# Patient Record
Sex: Male | Born: 1945 | Race: Black or African American | Hispanic: No | Marital: Married | Smoking: Former smoker
Health system: Southern US, Community
[De-identification: ages and names within clinical notes are randomized; demographics above are authoritative.]

## PROBLEM LIST (undated history)

## (undated) DIAGNOSIS — E785 Hyperlipidemia, unspecified: Secondary | ICD-10-CM

## (undated) DIAGNOSIS — R0600 Dyspnea, unspecified: Secondary | ICD-10-CM

## (undated) DIAGNOSIS — E119 Type 2 diabetes mellitus without complications: Secondary | ICD-10-CM

## (undated) DIAGNOSIS — F329 Major depressive disorder, single episode, unspecified: Secondary | ICD-10-CM

## (undated) DIAGNOSIS — F32A Depression, unspecified: Secondary | ICD-10-CM

## (undated) DIAGNOSIS — N5089 Other specified disorders of the male genital organs: Secondary | ICD-10-CM

## (undated) DIAGNOSIS — I509 Heart failure, unspecified: Secondary | ICD-10-CM

## (undated) DIAGNOSIS — Z862 Personal history of diseases of the blood and blood-forming organs and certain disorders involving the immune mechanism: Secondary | ICD-10-CM

## (undated) DIAGNOSIS — R5383 Other fatigue: Secondary | ICD-10-CM

## (undated) DIAGNOSIS — D649 Anemia, unspecified: Secondary | ICD-10-CM

## (undated) DIAGNOSIS — M1990 Unspecified osteoarthritis, unspecified site: Secondary | ICD-10-CM

## (undated) DIAGNOSIS — H269 Unspecified cataract: Secondary | ICD-10-CM

## (undated) DIAGNOSIS — R0609 Other forms of dyspnea: Secondary | ICD-10-CM

## (undated) DIAGNOSIS — K635 Polyp of colon: Secondary | ICD-10-CM

## (undated) DIAGNOSIS — I447 Left bundle-branch block, unspecified: Secondary | ICD-10-CM

## (undated) DIAGNOSIS — I10 Essential (primary) hypertension: Secondary | ICD-10-CM

## (undated) DIAGNOSIS — N189 Chronic kidney disease, unspecified: Secondary | ICD-10-CM

## (undated) DIAGNOSIS — F0280 Dementia in other diseases classified elsewhere without behavioral disturbance: Secondary | ICD-10-CM

## (undated) DIAGNOSIS — E559 Vitamin D deficiency, unspecified: Secondary | ICD-10-CM

## (undated) DIAGNOSIS — IMO0002 Reserved for concepts with insufficient information to code with codable children: Secondary | ICD-10-CM

## (undated) DIAGNOSIS — R269 Unspecified abnormalities of gait and mobility: Secondary | ICD-10-CM

## (undated) DIAGNOSIS — K219 Gastro-esophageal reflux disease without esophagitis: Secondary | ICD-10-CM

## (undated) DIAGNOSIS — F419 Anxiety disorder, unspecified: Secondary | ICD-10-CM

## (undated) DIAGNOSIS — Z5189 Encounter for other specified aftercare: Secondary | ICD-10-CM

## (undated) DIAGNOSIS — R600 Localized edema: Secondary | ICD-10-CM

## (undated) HISTORY — DX: Localized edema: R60.0

## (undated) HISTORY — DX: Gastro-esophageal reflux disease without esophagitis: K21.9

## (undated) HISTORY — DX: Personal history of diseases of the blood and blood-forming organs and certain disorders involving the immune mechanism: Z86.2

## (undated) HISTORY — DX: Polyp of colon: K63.5

## (undated) HISTORY — DX: Unspecified abnormalities of gait and mobility: R26.9

## (undated) HISTORY — PX: UPPER GASTROINTESTINAL ENDOSCOPY: SHX188

## (undated) HISTORY — DX: Essential (primary) hypertension: I10

## (undated) HISTORY — DX: Anemia, unspecified: D64.9

## (undated) HISTORY — DX: Vitamin D deficiency, unspecified: E55.9

## (undated) HISTORY — DX: Other fatigue: R53.83

## (undated) HISTORY — DX: Depression, unspecified: F32.A

## (undated) HISTORY — DX: Reserved for concepts with insufficient information to code with codable children: IMO0002

## (undated) HISTORY — PX: ROTATOR CUFF REPAIR: SHX139

## (undated) HISTORY — DX: Encounter for other specified aftercare: Z51.89

## (undated) HISTORY — DX: Unspecified osteoarthritis, unspecified site: M19.90

## (undated) HISTORY — DX: Anxiety disorder, unspecified: F41.9

## (undated) HISTORY — DX: Chronic kidney disease, unspecified: N18.9

## (undated) HISTORY — DX: Unspecified cataract: H26.9

## (undated) HISTORY — DX: Hyperlipidemia, unspecified: E78.5

## (undated) HISTORY — DX: Heart failure, unspecified: I50.9

## (undated) HISTORY — DX: Dyspnea, unspecified: R06.00

## (undated) HISTORY — DX: Left bundle-branch block, unspecified: I44.7

## (undated) HISTORY — DX: Major depressive disorder, single episode, unspecified: F32.9

## (undated) HISTORY — DX: Dementia in other diseases classified elsewhere, unspecified severity, without behavioral disturbance, psychotic disturbance, mood disturbance, and anxiety: F02.80

## (undated) HISTORY — DX: Other specified disorders of the male genital organs: N50.89

## (undated) HISTORY — DX: Other forms of dyspnea: R06.09

---

## 1998-04-17 ENCOUNTER — Ambulatory Visit (HOSPITAL_COMMUNITY): Admission: RE | Admit: 1998-04-17 | Discharge: 1998-04-17 | Payer: Self-pay | Admitting: Internal Medicine

## 1998-04-17 ENCOUNTER — Encounter: Payer: Self-pay | Admitting: Internal Medicine

## 1998-05-24 ENCOUNTER — Ambulatory Visit (HOSPITAL_COMMUNITY): Admission: RE | Admit: 1998-05-24 | Discharge: 1998-05-24 | Payer: Self-pay | Admitting: Gastroenterology

## 1998-05-24 ENCOUNTER — Encounter: Payer: Self-pay | Admitting: Gastroenterology

## 2002-01-06 HISTORY — PX: HIP SURGERY: SHX245

## 2002-06-29 ENCOUNTER — Encounter: Payer: Self-pay | Admitting: Internal Medicine

## 2002-06-29 ENCOUNTER — Encounter: Admission: RE | Admit: 2002-06-29 | Discharge: 2002-06-29 | Payer: Self-pay | Admitting: Internal Medicine

## 2002-09-16 ENCOUNTER — Ambulatory Visit (HOSPITAL_COMMUNITY): Admission: RE | Admit: 2002-09-16 | Discharge: 2002-09-16 | Payer: Self-pay | Admitting: Orthopaedic Surgery

## 2002-11-07 ENCOUNTER — Inpatient Hospital Stay (HOSPITAL_COMMUNITY): Admission: RE | Admit: 2002-11-07 | Discharge: 2002-11-11 | Payer: Self-pay | Admitting: Orthopaedic Surgery

## 2004-09-23 ENCOUNTER — Ambulatory Visit: Payer: Self-pay | Admitting: Gastroenterology

## 2004-10-09 ENCOUNTER — Ambulatory Visit: Payer: Self-pay | Admitting: Gastroenterology

## 2004-10-09 ENCOUNTER — Encounter (INDEPENDENT_AMBULATORY_CARE_PROVIDER_SITE_OTHER): Payer: Self-pay | Admitting: *Deleted

## 2005-04-05 ENCOUNTER — Encounter: Admission: RE | Admit: 2005-04-05 | Discharge: 2005-04-05 | Payer: Self-pay | Admitting: Orthopedic Surgery

## 2005-05-22 ENCOUNTER — Ambulatory Visit (HOSPITAL_COMMUNITY): Admission: RE | Admit: 2005-05-22 | Discharge: 2005-05-23 | Payer: Self-pay | Admitting: Orthopedic Surgery

## 2008-08-10 ENCOUNTER — Encounter: Payer: Self-pay | Admitting: Gastroenterology

## 2008-09-21 ENCOUNTER — Ambulatory Visit: Payer: Self-pay | Admitting: Gastroenterology

## 2008-09-21 DIAGNOSIS — Z860100 Personal history of colon polyps, unspecified: Secondary | ICD-10-CM | POA: Insufficient documentation

## 2008-09-21 DIAGNOSIS — E119 Type 2 diabetes mellitus without complications: Secondary | ICD-10-CM | POA: Insufficient documentation

## 2008-09-21 DIAGNOSIS — E1129 Type 2 diabetes mellitus with other diabetic kidney complication: Secondary | ICD-10-CM

## 2008-09-21 DIAGNOSIS — E1122 Type 2 diabetes mellitus with diabetic chronic kidney disease: Secondary | ICD-10-CM | POA: Insufficient documentation

## 2008-09-21 DIAGNOSIS — D509 Iron deficiency anemia, unspecified: Secondary | ICD-10-CM

## 2008-09-25 ENCOUNTER — Telehealth: Payer: Self-pay | Admitting: Gastroenterology

## 2008-10-06 ENCOUNTER — Ambulatory Visit: Payer: Self-pay | Admitting: Gastroenterology

## 2008-10-06 LAB — CONVERTED CEMR LAB: Fecal Occult Bld: NEGATIVE

## 2008-10-23 ENCOUNTER — Telehealth: Payer: Self-pay | Admitting: Gastroenterology

## 2008-10-24 ENCOUNTER — Ambulatory Visit: Payer: Self-pay | Admitting: Gastroenterology

## 2008-11-23 ENCOUNTER — Ambulatory Visit: Payer: Self-pay | Admitting: Gastroenterology

## 2008-11-23 LAB — CONVERTED CEMR LAB
Fecal Occult Blood: NEGATIVE
OCCULT 1: NEGATIVE
OCCULT 2: NEGATIVE
OCCULT 3: NEGATIVE
OCCULT 4: NEGATIVE
OCCULT 5: NEGATIVE

## 2008-11-24 ENCOUNTER — Encounter: Payer: Self-pay | Admitting: Gastroenterology

## 2009-06-21 ENCOUNTER — Encounter
Admission: RE | Admit: 2009-06-21 | Discharge: 2009-06-21 | Payer: Self-pay | Source: Home / Self Care | Admitting: Neurology

## 2009-08-30 ENCOUNTER — Ambulatory Visit: Payer: Self-pay | Admitting: Psychology

## 2009-11-28 ENCOUNTER — Encounter: Payer: Self-pay | Admitting: Physician Assistant

## 2009-12-04 ENCOUNTER — Encounter: Payer: Self-pay | Admitting: Gastroenterology

## 2010-01-10 ENCOUNTER — Telehealth: Payer: Self-pay | Admitting: Gastroenterology

## 2010-01-11 ENCOUNTER — Ambulatory Visit
Admission: RE | Admit: 2010-01-11 | Discharge: 2010-01-11 | Payer: Self-pay | Source: Home / Self Care | Attending: Internal Medicine | Admitting: Internal Medicine

## 2010-01-11 ENCOUNTER — Encounter: Payer: Self-pay | Admitting: Gastroenterology

## 2010-01-11 ENCOUNTER — Other Ambulatory Visit: Payer: Self-pay | Admitting: Physician Assistant

## 2010-01-11 DIAGNOSIS — E785 Hyperlipidemia, unspecified: Secondary | ICD-10-CM | POA: Insufficient documentation

## 2010-01-11 DIAGNOSIS — F329 Major depressive disorder, single episode, unspecified: Secondary | ICD-10-CM | POA: Insufficient documentation

## 2010-01-11 DIAGNOSIS — I10 Essential (primary) hypertension: Secondary | ICD-10-CM | POA: Insufficient documentation

## 2010-01-11 DIAGNOSIS — K219 Gastro-esophageal reflux disease without esophagitis: Secondary | ICD-10-CM | POA: Insufficient documentation

## 2010-01-11 LAB — IRON: Iron: 37 ug/dL — ABNORMAL LOW (ref 42–165)

## 2010-01-11 LAB — FERRITIN: Ferritin: 60.4 ng/mL (ref 22.0–322.0)

## 2010-01-11 LAB — IBC PANEL
Iron: 37 ug/dL — ABNORMAL LOW (ref 42–165)
Saturation Ratios: 14.5 % — ABNORMAL LOW (ref 20.0–50.0)
Transferrin: 181.9 mg/dL — ABNORMAL LOW (ref 212.0–360.0)

## 2010-01-17 ENCOUNTER — Telehealth: Payer: Self-pay | Admitting: Physician Assistant

## 2010-01-22 ENCOUNTER — Telehealth: Payer: Self-pay | Admitting: Physician Assistant

## 2010-01-28 ENCOUNTER — Ambulatory Visit
Admission: RE | Admit: 2010-01-28 | Discharge: 2010-01-28 | Payer: Self-pay | Source: Home / Self Care | Attending: Gastroenterology | Admitting: Gastroenterology

## 2010-01-28 ENCOUNTER — Encounter: Payer: Self-pay | Admitting: Gastroenterology

## 2010-01-28 HISTORY — PX: COLONOSCOPY: SHX174

## 2010-01-29 LAB — GLUCOSE, CAPILLARY
Glucose-Capillary: 178 mg/dL — ABNORMAL HIGH (ref 70–99)
Glucose-Capillary: 166 mg/dL — ABNORMAL HIGH (ref 70–99)

## 2010-02-03 ENCOUNTER — Emergency Department (HOSPITAL_COMMUNITY)
Admission: EM | Admit: 2010-02-03 | Discharge: 2010-02-03 | Payer: Self-pay | Source: Home / Self Care | Admitting: Emergency Medicine

## 2010-02-03 LAB — BASIC METABOLIC PANEL
BUN: 18 mg/dL (ref 6–23)
CO2: 22 mEq/L (ref 19–32)
Calcium: 9 mg/dL (ref 8.4–10.5)
Chloride: 99 mEq/L (ref 96–112)
Creatinine, Ser: 1.14 mg/dL (ref 0.4–1.5)
GFR calc Af Amer: >60 mL/min (ref >60)
GFR calc non Af Amer: >60 mL/min (ref >60)
Glucose, Bld: 231 mg/dL — ABNORMAL HIGH (ref 70–99)
Potassium: 4.3 mEq/L (ref 3.5–5.1)
Sodium: 132 mEq/L — ABNORMAL LOW (ref 135–145)

## 2010-02-03 LAB — CBC
HCT: 26.6 % — ABNORMAL LOW (ref 39.0–52.0)
Hemoglobin: 8.8 g/dL — ABNORMAL LOW (ref 13.0–17.0)
MCH: 28 pg (ref 26.0–34.0)
MCHC: 33.1 g/dL (ref 30.0–36.0)
MCV: 84.7 fL (ref 78.0–100.0)
Platelets: 243 10*3/uL (ref 150–400)
RBC: 3.14 MIL/uL — ABNORMAL LOW (ref 4.22–5.81)
RDW: 15.3 % (ref 11.5–15.5)
WBC: 7.6 10*3/uL (ref 4.0–10.5)

## 2010-02-03 LAB — DIFFERENTIAL
Basophils Absolute: 0 10*3/uL (ref 0.0–0.1)
Basophils Relative: 0 % (ref 0–1)
Eosinophils Absolute: 0 10*3/uL (ref 0.0–0.7)
Eosinophils Relative: 0 % (ref 0–5)
Lymphocytes Relative: 10 % — ABNORMAL LOW (ref 12–46)
Lymphs Abs: 0.8 10*3/uL (ref 0.7–4.0)
Monocytes Absolute: 0.6 10*3/uL (ref 0.1–1.0)
Monocytes Relative: 7 % (ref 3–12)
Neutro Abs: 6.2 10*3/uL (ref 1.7–7.7)
Neutrophils Relative %: 82 % — ABNORMAL HIGH (ref 43–77)

## 2010-02-03 LAB — URINALYSIS, ROUTINE W REFLEX MICROSCOPIC
Hgb urine dipstick: NEGATIVE
Ketones, ur: 15 mg/dL — AB
Nitrite: NEGATIVE
Protein, ur: NEGATIVE mg/dL
Specific Gravity, Urine: 1.023 (ref 1.005–1.030)
Urine Glucose, Fasting: NEGATIVE mg/dL
Urobilinogen, UA: 1 mg/dL (ref 0.0–1.0)
pH: 5.5 (ref 5.0–8.0)

## 2010-02-04 ENCOUNTER — Encounter: Payer: Self-pay | Admitting: Gastroenterology

## 2010-02-04 ENCOUNTER — Inpatient Hospital Stay (HOSPITAL_COMMUNITY)
Admission: EM | Admit: 2010-02-04 | Discharge: 2010-02-13 | DRG: 468 | Disposition: A | Discharge status: Skilled Nursing Facility | Payer: BC Managed Care – PPO | Attending: Internal Medicine | Admitting: Internal Medicine

## 2010-02-04 DIAGNOSIS — R7401 Elevation of levels of liver transaminase levels: Secondary | ICD-10-CM | POA: Diagnosis present

## 2010-02-04 DIAGNOSIS — I10 Essential (primary) hypertension: Secondary | ICD-10-CM | POA: Diagnosis present

## 2010-02-04 DIAGNOSIS — K5900 Constipation, unspecified: Secondary | ICD-10-CM | POA: Diagnosis not present

## 2010-02-04 DIAGNOSIS — M009 Pyogenic arthritis, unspecified: Secondary | ICD-10-CM | POA: Diagnosis present

## 2010-02-04 DIAGNOSIS — B966 Bacteroides fragilis [B. fragilis] as the cause of diseases classified elsewhere: Secondary | ICD-10-CM | POA: Diagnosis present

## 2010-02-04 DIAGNOSIS — R000 Tachycardia, unspecified: Secondary | ICD-10-CM | POA: Diagnosis not present

## 2010-02-04 DIAGNOSIS — R0602 Shortness of breath: Secondary | ICD-10-CM | POA: Diagnosis not present

## 2010-02-04 DIAGNOSIS — K6812 Psoas muscle abscess: Principal | ICD-10-CM | POA: Diagnosis present

## 2010-02-04 DIAGNOSIS — Z96649 Presence of unspecified artificial hip joint: Secondary | ICD-10-CM

## 2010-02-04 DIAGNOSIS — M629 Disorder of muscle, unspecified: Secondary | ICD-10-CM | POA: Diagnosis present

## 2010-02-04 DIAGNOSIS — E119 Type 2 diabetes mellitus without complications: Secondary | ICD-10-CM | POA: Diagnosis present

## 2010-02-04 DIAGNOSIS — D638 Anemia in other chronic diseases classified elsewhere: Secondary | ICD-10-CM | POA: Diagnosis present

## 2010-02-04 DIAGNOSIS — K219 Gastro-esophageal reflux disease without esophagitis: Secondary | ICD-10-CM | POA: Diagnosis present

## 2010-02-04 DIAGNOSIS — M2420 Disorder of ligament, unspecified site: Secondary | ICD-10-CM | POA: Diagnosis present

## 2010-02-04 DIAGNOSIS — T3695XA Adverse effect of unspecified systemic antibiotic, initial encounter: Secondary | ICD-10-CM | POA: Diagnosis not present

## 2010-02-04 DIAGNOSIS — B9689 Other specified bacterial agents as the cause of diseases classified elsewhere: Secondary | ICD-10-CM | POA: Diagnosis present

## 2010-02-04 DIAGNOSIS — R7402 Elevation of levels of lactic acid dehydrogenase (LDH): Secondary | ICD-10-CM | POA: Diagnosis present

## 2010-02-04 DIAGNOSIS — T4275XA Adverse effect of unspecified antiepileptic and sedative-hypnotic drugs, initial encounter: Secondary | ICD-10-CM | POA: Diagnosis not present

## 2010-02-04 DIAGNOSIS — R197 Diarrhea, unspecified: Secondary | ICD-10-CM | POA: Diagnosis not present

## 2010-02-04 DIAGNOSIS — M1990 Unspecified osteoarthritis, unspecified site: Secondary | ICD-10-CM | POA: Diagnosis present

## 2010-02-04 LAB — CBC
HCT: 24.8 % — ABNORMAL LOW (ref 39.0–52.0)
Hemoglobin: 8.3 g/dL — ABNORMAL LOW (ref 13.0–17.0)
MCH: 28.5 pg (ref 26.0–34.0)
MCHC: 33.5 g/dL (ref 30.0–36.0)
MCV: 85.2 fL (ref 78.0–100.0)
Platelets: 259 10*3/uL (ref 150–400)
RBC: 2.91 MIL/uL — ABNORMAL LOW (ref 4.22–5.81)
RDW: 15.1 % (ref 11.5–15.5)
WBC: 6.2 10*3/uL (ref 4.0–10.5)

## 2010-02-04 LAB — COMPREHENSIVE METABOLIC PANEL
ALT: 302 U/L — ABNORMAL HIGH (ref 0–53)
AST: 120 U/L — ABNORMAL HIGH (ref 0–37)
Albumin: 2 g/dL — ABNORMAL LOW (ref 3.5–5.2)
Alkaline Phosphatase: 141 U/L — ABNORMAL HIGH (ref 39–117)
BUN: 31 mg/dL — ABNORMAL HIGH (ref 6–23)
CO2: 26 mEq/L (ref 19–32)
Calcium: 9.5 mg/dL (ref 8.4–10.5)
Chloride: 102 mEq/L (ref 96–112)
Creatinine, Ser: 1.3 mg/dL (ref 0.4–1.5)
GFR calc Af Amer: >60 mL/min (ref >60)
GFR calc non Af Amer: 56 mL/min — ABNORMAL LOW (ref >60)
Glucose, Bld: 202 mg/dL — ABNORMAL HIGH (ref 70–99)
Potassium: 4.4 mEq/L (ref 3.5–5.1)
Sodium: 134 mEq/L — ABNORMAL LOW (ref 135–145)
Total Bilirubin: 0.7 mg/dL (ref 0.3–1.2)
Total Protein: 6.4 g/dL (ref 6.0–8.3)

## 2010-02-04 LAB — PROTIME-INR
INR: 1.28 (ref 0.00–1.49)
Prothrombin Time: 16.2 seconds — ABNORMAL HIGH (ref 11.6–15.2)

## 2010-02-04 LAB — URINALYSIS, ROUTINE W REFLEX MICROSCOPIC
Bilirubin Urine: NEGATIVE
Ketones, ur: NEGATIVE mg/dL
Nitrite: NEGATIVE
Protein, ur: NEGATIVE mg/dL
Specific Gravity, Urine: 1.024 (ref 1.005–1.030)
Urine Glucose, Fasting: NEGATIVE mg/dL
Urobilinogen, UA: 0.2 mg/dL (ref 0.0–1.0)
pH: 5 (ref 5.0–8.0)

## 2010-02-04 LAB — DIFFERENTIAL
Basophils Absolute: 0 10*3/uL (ref 0.0–0.1)
Basophils Relative: 0 % (ref 0–1)
Eosinophils Absolute: 0.1 10*3/uL (ref 0.0–0.7)
Eosinophils Relative: 1 % (ref 0–5)
Lymphocytes Relative: 10 % — ABNORMAL LOW (ref 12–46)
Lymphs Abs: 0.6 10*3/uL — ABNORMAL LOW (ref 0.7–4.0)
Monocytes Absolute: 0.5 10*3/uL (ref 0.1–1.0)
Monocytes Relative: 8 % (ref 3–12)
Neutro Abs: 5 10*3/uL (ref 1.7–7.7)
Neutrophils Relative %: 81 % — ABNORMAL HIGH (ref 43–77)
WBC Morphology: INCREASED

## 2010-02-04 LAB — APTT: aPTT: 39 seconds — ABNORMAL HIGH (ref 24–37)

## 2010-02-04 LAB — URINE MICROSCOPIC-ADD ON

## 2010-02-04 LAB — HEPATIC FUNCTION PANEL
ALT: 288 U/L — ABNORMAL HIGH (ref 0–53)
AST: 106 U/L — ABNORMAL HIGH (ref 0–37)
Indirect Bilirubin: 0.3 mg/dL (ref 0.3–0.9)
Total Protein: 6.3 g/dL (ref 6.0–8.3)

## 2010-02-04 LAB — ABO/RH: ABO/RH(D): O POS

## 2010-02-04 LAB — LIPASE, BLOOD: Lipase: 13 U/L (ref 11–59)

## 2010-02-04 LAB — GLUCOSE, CAPILLARY: Glucose-Capillary: 197 mg/dL — ABNORMAL HIGH (ref 70–99)

## 2010-02-04 LAB — OCCULT BLOOD, POC DEVICE: Fecal Occult Bld: NEGATIVE

## 2010-02-05 LAB — HEMOGLOBIN A1C: Mean Plasma Glucose: 123 mg/dL — ABNORMAL HIGH (ref <117)

## 2010-02-05 LAB — HEPATITIS PANEL, ACUTE
HCV Ab: NEGATIVE
Hep A IgM: NEGATIVE
Hep B C IgM: NEGATIVE
Hepatitis B Surface Ag: NEGATIVE

## 2010-02-05 LAB — BASIC METABOLIC PANEL
BUN: 19 mg/dL (ref 6–23)
CO2: 25 mEq/L (ref 19–32)
Calcium: 9 mg/dL (ref 8.4–10.5)
Creatinine, Ser: 0.79 mg/dL (ref 0.4–1.5)
GFR calc non Af Amer: >60 mL/min (ref >60)
Glucose, Bld: 111 mg/dL — ABNORMAL HIGH (ref 70–99)
Sodium: 138 mEq/L (ref 135–145)

## 2010-02-05 LAB — CBC
HCT: 21.5 % — ABNORMAL LOW (ref 39.0–52.0)
MCH: 28.2 pg (ref 26.0–34.0)
MCHC: 33.5 g/dL (ref 30.0–36.0)
RDW: 15 % (ref 11.5–15.5)

## 2010-02-05 LAB — GLUCOSE, CAPILLARY: Glucose-Capillary: 78 mg/dL (ref 70–99)

## 2010-02-05 NOTE — Letter (Signed)
Summary: Colonoscopy Letter  [facility]  [address] [place], [place] [address]   Phone: [phone]  Fax: [phone]      December 04, 2009 MRN: [id number]   ANTOINO WESTHOFF [address] [place], [place]  [address]   Dear Mr. MARSALA,   According to your medical record, it is time for you to schedule a Colonoscopy. The American Cancer Society recommends this procedure as a method to detect early colon cancer. Patients with a family history of colon cancer, or a personal history of colon polyps or inflammatory bowel disease are at increased risk.  This letter has been generated based on the recommendations made at the time of your procedure. If you feel that in your particular situation this may no longer apply, please contact our office.  Please call our office at [phone] to schedule this appointment or to update your records at your earliest convenience.  Thank you for cooperating with [place] to provide you with the very best care possible.   Sincerely,  Barbette Hair. Arlyce Dice, M.D.  [facility] [phone]

## 2010-02-06 ENCOUNTER — Inpatient Hospital Stay (HOSPITAL_COMMUNITY): Payer: BC Managed Care – PPO

## 2010-02-06 DIAGNOSIS — K6812 Psoas muscle abscess: Secondary | ICD-10-CM

## 2010-02-06 HISTORY — PX: HIP SURGERY: SHX245

## 2010-02-06 LAB — PATHOLOGIST SMEAR REVIEW

## 2010-02-06 LAB — BODY FLUID CELL COUNT WITH DIFFERENTIAL: Total Nucleated Cell Count, Fluid: UNDETERMINED cu mm (ref 0–1000)

## 2010-02-06 LAB — GLUCOSE, CAPILLARY
Glucose-Capillary: 99 mg/dL (ref 70–99)
Glucose-Capillary: 148 mg/dL — ABNORMAL HIGH (ref 70–99)
Glucose-Capillary: 131 mg/dL — ABNORMAL HIGH (ref 70–99)
Glucose-Capillary: 163 mg/dL — ABNORMAL HIGH (ref 70–99)
Glucose-Capillary: 147 mg/dL — ABNORMAL HIGH (ref 70–99)
Glucose-Capillary: 152 mg/dL — ABNORMAL HIGH (ref 70–99)

## 2010-02-06 LAB — DIFFERENTIAL
Basophils Relative: 0 % (ref 0–1)
Eosinophils Absolute: 0 10*3/uL (ref 0.0–0.7)
Eosinophils Relative: 1 % (ref 0–5)
Monocytes Absolute: 0.5 10*3/uL (ref 0.1–1.0)
Neutro Abs: 3.2 10*3/uL (ref 1.7–7.7)

## 2010-02-06 LAB — IRON AND TIBC
Saturation Ratios: 11 % — ABNORMAL LOW (ref 20–55)
UIBC: 119 ug/dL

## 2010-02-06 LAB — CBC
HCT: 21.3 % — ABNORMAL LOW (ref 39.0–52.0)
MCH: 28.1 pg (ref 26.0–34.0)
MCV: 84.2 fL (ref 78.0–100.0)
Platelets: 226 10*3/uL (ref 150–400)
RBC: 2.53 MIL/uL — ABNORMAL LOW (ref 4.22–5.81)
RDW: 14.8 % (ref 11.5–15.5)
WBC: 4.6 10*3/uL (ref 4.0–10.5)

## 2010-02-06 LAB — COMPREHENSIVE METABOLIC PANEL
Albumin: 1.7 g/dL — ABNORMAL LOW (ref 3.5–5.2)
Alkaline Phosphatase: 107 U/L (ref 39–117)
BUN: 13 mg/dL (ref 6–23)
Chloride: 105 mEq/L (ref 96–112)
Creatinine, Ser: 0.61 mg/dL (ref 0.4–1.5)
Glucose, Bld: 96 mg/dL (ref 70–99)
Potassium: 4.2 mEq/L (ref 3.5–5.1)
Total Bilirubin: 0.4 mg/dL (ref 0.3–1.2)

## 2010-02-06 LAB — FOLATE: Folate: 11.6 ng/mL

## 2010-02-06 LAB — SEDIMENTATION RATE: Sed Rate: 84 mm/hr — ABNORMAL HIGH (ref 0–16)

## 2010-02-06 LAB — GRAM STAIN

## 2010-02-06 LAB — VITAMIN B12: Vitamin B-12: >2000 pg/mL — ABNORMAL HIGH (ref 211–911)

## 2010-02-07 LAB — GLUCOSE, CAPILLARY
Glucose-Capillary: 138 mg/dL — ABNORMAL HIGH (ref 70–99)
Glucose-Capillary: 193 mg/dL — ABNORMAL HIGH (ref 70–99)

## 2010-02-07 LAB — DIFFERENTIAL
Basophils Absolute: 0 10*3/uL (ref 0.0–0.1)
Basophils Relative: 0 % (ref 0–1)
Eosinophils Absolute: 0 10*3/uL (ref 0.0–0.7)
Lymphs Abs: 0.8 10*3/uL (ref 0.7–4.0)
Monocytes Absolute: 0.5 10*3/uL (ref 0.1–1.0)
Neutro Abs: 4.3 10*3/uL (ref 1.7–7.7)

## 2010-02-07 LAB — BASIC METABOLIC PANEL
BUN: 12 mg/dL (ref 6–23)
Calcium: 8.5 mg/dL (ref 8.4–10.5)
Chloride: 105 mEq/L (ref 96–112)
Creatinine, Ser: 0.81 mg/dL (ref 0.4–1.5)
GFR calc Af Amer: >60 mL/min (ref >60)

## 2010-02-07 LAB — CBC
MCH: 28.2 pg (ref 26.0–34.0)
MCHC: 33.2 g/dL (ref 30.0–36.0)
MCV: 84.8 fL (ref 78.0–100.0)
Platelets: 215 10*3/uL (ref 150–400)
RBC: 3.16 MIL/uL — ABNORMAL LOW (ref 4.22–5.81)
RDW: 14.8 % (ref 11.5–15.5)

## 2010-02-07 NOTE — Procedures (Signed)
Summary: Colonoscopy  Patient: Gerald [name] Note: All result statuses are Final unless otherwise noted.  Tests: (1) Colonoscopy (COL)   COL Colonoscopy           DONE     [facility]     [address]     [place], [place]  [address]           COLONOSCOPY PROCEDURE REPORT           PATIENT:  Gerald [name], Gerald [name]  MR#:  [id number]     BIRTHDATE:  November 16, 1945, 64 yrs. old  GENDER:  male           ENDOSCOPIST:  Barbette Hair. Arlyce Dice, MD     Referred by:  [name], M.D.           PROCEDURE DATE:  01/28/2010     PROCEDURE:  Diagnostic Colonoscopy     ASA CLASS:  Class II     INDICATIONS:  1) Iron deficiency anemia           MEDICATIONS:   Fentanyl 75 mcg IV, Versed 8 mg IV           DESCRIPTION OF PROCEDURE:   After the risks benefits and     alternatives of the procedure were thoroughly explained, informed     consent was obtained.  Digital rectal exam was performed and     revealed no abnormalities.   The LB 180AL [id number] endoscope was     introduced through the anus and advanced to the cecum, which was     identified by both the appendix and ileocecal valve, without     limitations.  The quality of the prep was excellent, using     MoviPrep.  The instrument was then slowly withdrawn as the colon     was fully examined.     <<PROCEDUREIMAGES>>           FINDINGS:  Scattered diverticula were found in the ascending colon     (see image3).  This was otherwise a normal examination of the     colon (see image2, image4, image6, image7, image9, image10, and     image12).   Retroflexed views in the rectum revealed no     abnormalities.    The time to cecum =  4.75  minutes. The scope     was then withdrawn (time =  6.25  min) from the patient and the     procedure completed.           COMPLICATIONS:  None           ENDOSCOPIC IMPRESSION:     1) Diverticula, scattered in the ascending colon     2) Otherwise normal examination     RECOMMENDATIONS:     1) followup  hemeoccults in 1 week  while holding mobic     2) Upper endoscopy will be scheduled           REPEAT EXAM:  No           ______________________________     Barbette Hair. Arlyce Dice, MD           CC:           n.     eSIGNED:   Barbette Hair. Raylin Winer at 01/28/2010 10:53 AM           Marshia Ly, [id number]  Note: An exclamation mark (!) indicates a result that was not dispersed into the flowsheet. Document Creation  Date: 01/28/2010 10:54 AM _______________________________________________________________________  (1) Order result status: Final Collection or observation date-time: 01/28/2010 10:48 Requested date-time:  Receipt date-time:  Reported date-time:  Referring Physician:   Ordering Physician: Melvia Heaps [phone]) Specimen Source:  Source: Launa Grill Order Number: [id number] Lab site:

## 2010-02-07 NOTE — Progress Notes (Signed)
Summary: Lab Results  Phone Note Call from Patient Call back at Home Phone [phone]   Call For: Gerald [name], [place] Reason for Call: Lab or Test Results Initial call taken by: Leanor Kail [facility],  January 17, 2010 4:28 PM  Follow-up for Phone Call        Patient called and wanted to know lab results from visit with Gerald Gip, PA. Told patient his iron was low. He is taking his Integra as Gerald Gip, PA ordered for this. Follow-up by: Jesse Fall RN,  January 17, 2010 4:57 PM

## 2010-02-07 NOTE — Letter (Signed)
Summary: CMA Hemoccult Letter  [facility]  [address] [place], [place] [address]   Phone: [phone]  Fax: [phone]         January 28, 2010 MRN: [id number]    Gerald [name] [address] [place], [place]  [address]    Dear [name],     At your colonoscopy visit, Dr. Arlyce Dice requested that you complete these hemoccult cards within one week while holding Mobic. Please follow the instructions on the inside cover and return them as soon as possible.If you have misplaced the hemoccult cards, please call me at [phone] and I will mail you new cards. Your health is very important to [place].These tests will help ensure that Dr. Arlyce Dice has all the information at his disposal to make a complete diagnosis for you.  Thank you for your prompt attention to this matter.   Sincerely,    Selinda Michaels RN  Appended Document: CMA Hemoccult Letter Letter is mailed to the patient's home address

## 2010-02-07 NOTE — Letter (Signed)
Summary: Diabetic Instructions  [facility]  [address] [place], [place] [address]   Phone: [phone]  Fax: [phone]    Gerald [name] [birth year] MRN: [id number]   _  _   ORAL DIABETIC MEDICATION INSTRUCTIONS  The day before your procedure:   Take your diabetic pill as you do normally  The day of your procedure:   Do not take your diabetic pill    We will check your blood sugar levels during the admission process and again in Recovery before discharging you home  ________________________________________________________________________  _  _   INSULIN (LONG ACTING) MEDICATION INSTRUCTIONS (Lantus, NPH, 70/30, Humulin, Novolin-N)   The day before your procedure:   Take  your regular evening dose    The day of your procedure:   Do not take your morning dose    _  _   INSULIN (SHORT ACTING) MEDICATION INSTRUCTIONS (Regular, Humulog, Novolog)   The day before your procedure:   Do not take your evening dose   The day of your procedure:   Do not take your morning dose   _  _   INSULIN PUMP MEDICATION INSTRUCTIONS  We will contact the physician managing your diabetic care for written dosage instructions for the day before your procedure and the day of your procedure.  Once we have received the instructions, we will contact you.

## 2010-02-07 NOTE — Letter (Signed)
Summary: [facility] Instructions  [facility]  [address] [place], [place] [address]   Phone: [phone]  Fax: [phone]       Gerald [name]    11-May-1945    MRN: [id number]        Procedure Day /Date:01-28-2010     Arrival Time:9:00 Am      Procedure Time: 10:00 AM     Location of Procedure:                    x      [facility] (4th Floor)  PREPARATION FOR COLONOSCOPY WITH MOVIPREP   Starting 5 days prior to your procedure 01-23-2010  do not eat nuts, seeds, popcorn, corn, beans, peas,  salads, or any raw vegetables.  Do not take any fiber supplements (e.g. Metamucil, Citrucel, and Benefiber).  THE DAY BEFORE YOUR PROCEDURE         DATE: 01-27-2010 DAY: SUNDAY  1.  Drink clear liquids the entire day-NO SOLID FOOD  2.  Do not drink anything colored red or purple.  Avoid juices with pulp.  No orange juice.  3.  Drink at least 64 oz. (8 glasses) of fluid/clear liquids during the day to prevent dehydration and help the prep work efficiently.  CLEAR LIQUIDS INCLUDE: Water Jello Ice Popsicles Tea (sugar ok, no milk/cream) Powdered fruit flavored drinks Coffee (sugar ok, no milk/cream) Gatorade Juice: apple, white grape, white cranberry  Lemonade Clear bullion, consomm, broth Carbonated beverages (any kind) Strained chicken noodle soup Hard Candy                             4.  In the morning, mix first dose of MoviPrep solution:    Empty 1 Pouch A and 1 Pouch B into the disposable container    Add lukewarm drinking water to the top line of the container. Mix to dissolve    Refrigerate (mixed solution should be used within 24 hrs)  5.  Begin drinking the prep at 5:00 p.m. The MoviPrep container is divided by 4 marks.   Every 15 minutes drink the solution down to the next mark (approximately 8 oz) until the full liter is complete.   6.  Follow completed prep with 16 oz of clear liquid of your choice (Nothing red or purple).  Continue  to drink clear liquids until bedtime.  7.  Before going to bed, mix second dose of MoviPrep solution:    Empty 1 Pouch A and 1 Pouch B into the disposable container    Add lukewarm drinking water to the top line of the container. Mix to dissolve    Refrigerate  THE DAY OF YOUR PROCEDURE      DATE: 01-28-2010 DAY: MONDAY  Beginning at 5:00 a.m. (5 hours before procedure):         1. Every 15 minutes, drink the solution down to the next mark (approx 8 oz) until the full liter is complete.  2. Follow completed prep with 16 oz. of clear liquid of your choice.    3. You may drink clear liquids until 8:00 AM  (2 HOURS BEFORE PROCEDURE).   MEDICATION INSTRUCTIONS  Unless otherwise instructed, you should take regular prescription medications with a small sip of water   as early as possible the morning of your procedure.  Diabetic patients - see separate instructions.        OTHER INSTRUCTIONS  You  will need a responsible adult at least 65 years of age to accompany you and drive you home.   This person must remain in the waiting room during your procedure.  Wear loose fitting clothing that is easily removed.  Leave jewelry and other valuables at home.  However, you may wish to bring a book to read or  an iPod/MP3 player to listen to music as you wait for your procedure to start.  Remove all body piercing jewelry and leave at home.  Total time from sign-in until discharge is approximately 2-3 hours.  You should go home directly after your procedure and rest.  You can resume normal activities the  day after your procedure.  The day of your procedure you should not:   Drive   Make legal decisions   Operate machinery   Drink alcohol   Return to work  You will receive specific instructions about eating, activities and medications before you leave.    The above instructions have been reviewed and explained to me by   _______________________    I fully understand  and can verbalize these instructions _____________________________ Date _________

## 2010-02-07 NOTE — Progress Notes (Signed)
Summary: Labs  Phone Note Call from Patient Call back at Home Phone [phone] Call back at [phone]   Caller: Patient Call For: Arles Rumbold Reason for Call: Talk to Nurse Summary of Call: Pt dose not remembe speaking with nurse about his labs, would like to speak with someone again Initial call taken by: [place] Johnson,  January 22, 2010 4:54 PM  Follow-up for Phone Call        Spoke with patient and reviewed lab results. Follow-up by: Jesse Fall RN,  January 23, 2010 9:22 AM

## 2010-02-07 NOTE — Progress Notes (Signed)
Summary: triage  Phone Note From Other Clinic Call back at [phone]   Caller: Aram Beecham from Dr Kathryne Sharper office Call For: Dr Arlyce Dice Reason for Call: Schedule Patient Appt Summary of Call: Dr Oneta Rack would like this patient seen before first available appt 2-8 for low hemoglobin 9.8  Initial call taken by: Tawni [name],  January 10, 2010 9:35 AM  Follow-up for Phone Call        Appointment made with Gerald Gip, PA for 01/11/10 @ 11am. Follow-up by: Selinda Michaels RN,  January 10, 2010 11:32 AM

## 2010-02-07 NOTE — Assessment & Plan Note (Signed)
Summary: Anemia/LRH   History of Present Illness Visit Type: Initial Consult Primary GI MD: Melvia Heaps MD [facility] Primary Provider: [name], MD Requesting Provider: [name], MD Chief Complaint: pt had rectal bleeding x 2 days, anemia History of Present Illness:   65 YO MALE KNOWN TO DR. KAPLAN WHO HAS BEEN SEEN IN THE PAST FOR ANEMIA,  COLON POLYPS, GERD AND AN ESOPHAGEAL STRICTURE. HE IS REFERRED BACK TODAY PER DR. Oneta Rack WITH ANEMIA AND A DROP IN HIS HGB FROM 12.1 IN 11/11 TO 9.8 ON 01/09/09.  PT RELATES HX OF FE DEFICIENCY. HE HAD BEEN TAKING SLOFE  INTERMITTENTLY OVER THE PAST YEAR. HE HAD AN EPISODE ON 12/29/09 WITH BRB PER RECTUM AFTER A HARD STOOL . NO ASSOCIATED RECTAL PAIN. HE SAYS HE PASSED ALOT OF BLOOD IN THE COMMODE, THEN LESSER AMTS WITH BM'S THE  NEXT 2 DAYS, AND NONE SINCE. HE SAW DR  MCKEOWN EARLIER THIS WEEK AND WAS TOLD HEME NEGATIVE ON RECTAL EXAM.  HE FEELS FINE, CURRENTLY-SOME FATIGUE BUT HAS BEEN HAVING ALOT OF LEFT KNEE PAIN, AND NEEDS A KNEE REPLACEMENT. HE TAKES A MOBIC DAILY , AND  A COUPLE BABY ASA  DAILY. Marland Kitchen APPETITE FINE, WEIGHT STABLE,NO HEARTBURN ETC. LAST COLON 2006 -WITH ONE TUBULAR ADENOMA. EGD 10/10 WITH DISTAL STRICTURE DILATED, OTHERWISE NEGATIVE.        Reports rectal bleeding.      Current Medications (verified): 1)  Mobic 15 Mg Tabs (Meloxicam) .... Take One By Mouth Once Daily 2)  Preparation H 1-0.25-14.4-15 % Crea (Pramox-Pe-Glycerin-Petrolatum) .... Use As Needed 3)  Slow Fe 160 (50 Fe) Mg Cr-Tabs (Ferrous Sulfate Dried) .... Take One By Mouth Once Daily 4)  Aspirin 81 Mg Tbec (Aspirin) .... Take One By Mouth Once Daily 5)  Magnesium 250 Mg Tabs (Magnesium) .... Take Three By Mouth Once Daily 6)  D 1000 1000 Unit Caps (Cholecalciferol) .... Take One By Mouth Once Daily 7)  Vitamin B-12 Cr 2000 Mcg Cr-Tabs (Cyanocobalamin) .... Take One By Mouth Once Daily 8)  Centrum Silver  Tabs (Multiple Vitamins-Minerals) .... Take One By  Mouth Once Daily 9)  Zantac 300 Mg Tabs (Ranitidine Hcl) .... Take One By Mouth Once Daily 10)  Enalapril Maleate 20 Mg Tabs (Enalapril Maleate) .... Take One By Mouth Two Times A Day 11)  Furosemide 80 Mg Tabs (Furosemide) .... Take 1/2 Tablet By Mouth Once Daily 12)  Glimepiride 4 Mg Tabs (Glimepiride) .... Take One By Mouth Two Times A Day 13)  Bupropion Hcl 150 Mg Xr12h-Tab (Bupropion Hcl) .... Take One By Mouth Two Times A Day 14)  Fluoxetine Hcl 20 Mg Caps (Fluoxetine Hcl) .... Take One By Mouth Once Daily 15)  Simvastatin 80 Mg Tabs (Simvastatin) .... Take One By Mouth Once Daily 16)  Alprazolam 1 Mg Tabs (Alprazolam) .... Take 1/2 Tablet By Mouth Once Daily 17)  Metformin Hcl 500 Mg Tabs (Metformin Hcl) .... Take 4 Tabs By Mouth Once Daily 18)  Doxazosin Mesylate 8 Mg Tabs (Doxazosin Mesylate) .... Take One By Mouth Once Daily 19)  Percocet 5-325 Mg Tabs (Oxycodone-Acetaminophen) .... 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 Vasectomy hip replacement rotator cuff surgery  cateract surgery  Family History: Reviewed history from 09/21/2008 and no changes required. Family History of Liver Cancer: maternal aunt Family  History of Pancreatic Cancer:maternal aunt Family History of Stomach Cancer:maternal aunt No FH of Colon Cancer:  Social History: Reviewed history from 09/21/2008 and no changes required. Occupation: Event organiser Married one son and one daughter Patient is a former smoker.  Alcohol Use - yes Daily Caffeine Use Illicit Drug Use - no  Review of Systems       The patient complains of arthritis/joint pain and fatigue.  The patient denies allergy/sinus, anemia, anxiety-new, back pain, blood in urine,  breast changes/lumps, change in vision, confusion, cough, coughing up blood, depression-new, fainting, fever, headaches-new, hearing problems, heart murmur, heart rhythm changes, itching, menstrual pain, muscle pains/cramps, night sweats, nosebleeds, pregnancy symptoms, shortness of breath, skin rash, sleeping problems, sore throat, swelling of feet/legs, swollen lymph glands, thirst - excessive , urination - excessive , urination changes/pain, urine leakage, vision changes, and voice change.         SEE HPI  Vital Signs:  Patient profile:   65 year old male Height:      67.5 inches Weight:      204 pounds BMI:     31.59 Pulse rate:   82 / minute Pulse rhythm:   regular BP sitting:   140 / 54  (left arm)  Vitals Entered By: Chales Abrahams CMA Duncan Dull) (January 11, 2010 11:25 AM)  Physical Exam  General:  Well developed, well nourished, no acute distress. Head:  Normocephalic and atraumatic. Eyes:  PERRLA, no icterus. Lungs:  Clear throughout to auscultation. Heart:  Regular rate and rhythm; no murmurs, rubs,  or bruits. Abdomen:  SOFT, NONTENDER, NO MASS OR HSM,BS+ Rectal:  NOT DONE Msk:  arthritic changes.   Neurologic:  Alert and  oriented x4;  grossly normal neurologically. Psych:  Alert and cooperative. Normal mood and affect.   Impression & Recommendations:  Problem # 1:  RECTAL BLEEDING (ICD-569.3) Assessment New 65 YO MALE WITH PRIOR HX OF FE DEFICIENCY ANEMIA, WITH RECURENT NORMOCYTIC ANEMIA? FE DEFICIENCY, AND A RECENT EPISODE OF HEMATOCHEZIA/MODERATE  VOLUME OF UNCLEAR ETIOLOGY. HE HAS HX OF ADENOMATOUS POLYP AND IS DUE FOR COLONOSCOPY.  CHECK FE STUDIES TODAY CHANGE TO INTEGRA PLUS ONE DAILY X 3 MONTHS, THEN REPEAT FE STUDIES. STOP ASA. SCHEDULE FOR COLONOSCOPY WITH DR. KAPLAN. PROCEDURE DISCUSSED IN DETAIL WITH PT,FURTHER WORK-UP PENDING FINDINGS ON COLONOSCOPY.  Problem # 2:  PERSONAL HISTORY OF COLONIC POLYPS (ICD-V12.72) Assessment: Comment Only SEE  ABOVE  Problem # 3:  DM (ICD-250.00) Assessment: Comment Only  Problem # 4:  HYPERTENSION (ICD-401.9) Assessment: Comment Only  Problem # 5:  DEPRESSION (ICD-311) Assessment: Comment Only  Problem # 6:  GERD (ICD-530.81) Assessment: Comment Only STABLE  Other Orders: Colonoscopy (Colon) TLB-IBC Pnl (Iron/FE;Transferrin) (83550-IBC) TLB-Ferritin (82728-FER) TLB-Iron, (Fe) Total (83540-FE)  Patient Instructions: 1)  Please go to lab, basement level. 2)  We scheduled the Colonoscopy with Dr. Arlyce Dice for 01-28-2010. 3)  Directions and brochure provided. 4)  [facility] Patient Information Guide given to patient. 5)  We have given you samples of Integra Plus to take for 3 months, then come back to our lab on 03-11-2010 and have iron labs again. 6)  Copy sent to : [name] 7)  The medication list was reviewed and reconciled.  All changed / newly prescribed medications were explained.  A complete medication list was provided to the patient / caregiver. Prescriptions: INTEGRA PLUS  CAPS (FEFUM-FEPOLY-FA-B CMP-C-BIOT) Take 1 tab daily  #30 x 2   Entered by:   Lowry Ram NCMA  Authorized by:   Sammuel Cooper PA-c   Signed by:   Lowry Ram NCMA on 01/11/2010   Method used:   Electronically to        [facility] (retail)       [address]       [place], [place]  [address]       Ph: [phone]       Fax: [phone]   RxID:   [id number] MOVIPREP 100 GM  SOLR (PEG-KCL-NACL-NASULF-NA ASC-C) As per prep instructions.  #1 x 0   Entered by:   Lowry Ram NCMA   Authorized by:   Sammuel Cooper PA-c   Signed by:   Lowry Ram NCMA on 01/11/2010   Method used:   Electronically to        [facility] (retail)       [address]       [place], [place]  [address]       Ph: [phone]       Fax: [phone]   RxID:   [id number]  Integra Plus Iron capsules Samples given. Lot#  [id number] Exp date: 08/2010 [facility], pt changed his mind from [facility] [address] to [facility].

## 2010-02-08 ENCOUNTER — Inpatient Hospital Stay (HOSPITAL_COMMUNITY): Payer: BC Managed Care – PPO

## 2010-02-08 LAB — TYPE AND SCREEN
ABO/RH(D): O POS
Antibody Screen: NEGATIVE
Unit division: 0
Unit division: 0
Unit division: 0
Unit division: 0
Unit division: 0
Unit division: 0

## 2010-02-08 LAB — COMPREHENSIVE METABOLIC PANEL
ALT: 263 U/L — ABNORMAL HIGH (ref 0–53)
AST: 189 U/L — ABNORMAL HIGH (ref 0–37)
Albumin: 1.4 g/dL — ABNORMAL LOW (ref 3.5–5.2)
Alkaline Phosphatase: 96 U/L (ref 39–117)
Chloride: 109 mEq/L (ref 96–112)
GFR calc Af Amer: >60 mL/min (ref >60)
Potassium: 4.1 mEq/L (ref 3.5–5.1)
Total Bilirubin: 1.2 mg/dL (ref 0.3–1.2)

## 2010-02-08 LAB — CBC
HCT: 25.7 % — ABNORMAL LOW (ref 39.0–52.0)
Hemoglobin: 8.4 g/dL — ABNORMAL LOW (ref 13.0–17.0)
RBC: 3 MIL/uL — ABNORMAL LOW (ref 4.22–5.81)

## 2010-02-08 LAB — VANCOMYCIN, TROUGH: Vancomycin Tr: 20.3 ug/mL — ABNORMAL HIGH (ref 10.0–20.0)

## 2010-02-08 LAB — GLUCOSE, CAPILLARY: Glucose-Capillary: 121 mg/dL — ABNORMAL HIGH (ref 70–99)

## 2010-02-08 NOTE — H&P (Signed)
NAME:  Gerald [name], Gerald [name] NO.:  [id number]  MEDICAL RECORD NO.:  [id number]          PATIENT TYPE:  EMS  LOCATION:  ED                           FACILITY:  [facility]  PHYSICIAN:  Vania Rea, M.D. DATE OF BIRTH:  1945/08/06  DATE OF ADMISSION:  02/04/2010 DATE OF DISCHARGE:                             HISTORY & PHYSICAL   PRIMARY CARE PHYSICIAN:  [name], MD  GASTROENTEROLOGIST:  Barbette Hair. Arlyce Dice, MD, [facility]  CHIEF COMPLAINT:  Abnormal labs.  HISTORY OF PRESENT ILLNESS:  This is a 65 year old African American gentleman with a history of diabetes, GERD, and chronic severe osteoarthritis, who noted in about December of this year that he was passing bright red blood per rectum.  The patient through his primary care physician arranged for evaluation with endoscopy and did in fact have a colonoscopy on January 6 which showed evidence of diverticulosis without any evidence of acute bleeding.  The patient was subsequently scheduled to have surgery because of severe osteoarthritis.  He has a very bad left hip and left knee but because of his ongoing anemia, his orthopedic surgeon, Dr. Turner Daniels wanted further workup prior to management.  Additionally, over the past week the patient's wife has noted that his urine had a very unusual color and decided to bring him into his primary care physician's office today for further evaluation even though he had an appointment for tomorrow.  In the office, blood work was done which showed abnormal liver function tests and he was sent into the hospital for admission.  Of note, the patient's wife reports that he has been looking strangely very sick in a way that she cannot define for the past 1 month.  She has also noted progressive weight loss over the past year.  His weight has decreased from 262 pounds to 197 pounds although the patient says the weight loss is from dieting.  She says he does no exercise and she thinks the  weight loss is abnormal.  Of note, the patient's hemoglobin reportedly fell from 12 in November 2011 to 8  in December 2011.  Also of note, he does have multiple second-degree relatives with GI cancers.  His maternal aunt had cancer of the pancreas, liver, and stomach.  No family history of colon cancer.  PAST MEDICAL HISTORY: 1. Diabetes type 2. 2. Remote history of esophageal stricture with dilatation in October     2006. 3. GERD. 4. Anemia. 5. Hyperlipidemia. 6. Severe osteoarthritis. 7. Hypertension. 8. History of obesity. 9. 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 Ophelia Charter. 10.Rotator cuff repair in May 2007.  MEDICATIONS: 1. Multivitamins daily. 2. Vitamin B12 2000 mcg twice daily. 3. Iron tablets daily, has taken this for many years. 4. Furosemide 80 mg daily. 5. Amaryl 2 mg daily. 6. Vitamin D 10,000 units 3 times daily. 7. Enalapril 20 mg twice daily. 8. Bupropion 150 mg twice daily. 9. Fluoxetine 20 mg daily. 10.Alprazolam 1 mg at bedtime as needed. 11.Metformin 500 mg 3 times daily. 12.Doxazosin 8 mg at bedtime.  ALLERGIES:  No known drug allergies.  SOCIAL HISTORY:  Denies tobacco, alcohol or  illicit drug use.  Works with [employer].  FAMILY HISTORY:  Other than noted above, significant for a mother that lived to be 72, was hypertensive and had Alzheimer's.  No other known medical problems.  His father was bedridden for 42 years due to a gunshot wound but son is unaware of his medical problems.  REVIEW OF SYSTEMS:  Other than noted above, significant only for severe pains in the left hip and left knee which makes movement even on the bed very difficult.  Denies any nausea or vomiting.  Denies any chest pains or shortness of breath.  Denies any abdominal pain.  Denies any current black or bloody stool.  PHYSICAL EXAMINATION:  GENERAL:  Ill-looking middle-aged African American gentleman lying in bed. VITAL SIGNS:  Temperature is 98.8, pulse  89, respirations 20, blood pressure 98/63 saturating 99% on 2 liters. HEENT:  Mucous membranes are pale, dry, anicteric.  He has upper and lower bridges with erosion of his natural teeth. NECK:  No cervical lymphadenopathy or thyromegaly.  No carotid bruits. CHEST:  Clear to auscultation bilaterally. CARDIOVASCULAR:  Regular rhythm.  No murmur. ABDOMEN:  Soft.  No clear liver margin is felt but he does have a fullness in the right upper quadrant.  There is no tenderness.  His spleen was not felt.  He has normal abdominal bowel sounds. EXTREMITIES:  His left lower extremity is kept flexed.  The left knee is swollen and shiny.  There does appear to be an effusion.  The left thigh is somewhat tender but neither calf nor ankle is swollen.  Both toes are warm. CENTRAL NERVOUS SYSTEM:  Cranial nerves II-XII are grossly intact and within the limits of the exam because of his orthopedic problems.  There are no lateralizing neurologic signs.  LABORATORY DATA:  His white count is 6.2, hemoglobin 8.3, MCV 85, platelets 259 and 81% neutrophils.  He has increased bands, greater than 20% bands.  His sodium is 134, potassium 4.4, chloride 102, CO2 of 26, glucose 202, BUN 31, creatinine 1.3.  His calcium is 9.5.  His albumin is 2.0 making his corrected calcium elevated at 11.1.  His total protein is 6.4, alk phos is elevated at 147, AST is elevated at 120, ALT is elevated to 302.  PT is elevated at 16.2.  Fecal occult blood is negative.  Urinalysis shows urine with a specific gravity of 1.024, small amount of blood, trace leukocyte esterase negative for proteins, negative for nitrites.  Urine microscopy shows 3-6 red cells but otherwise unremarkable.  Radiologic studies are pending.  ASSESSMENT: 1. Abnormal liver function tests and a history with second-degree     relatives with gastrointestinal cancers. 2. Progressive weight loss. 3. Normocytic anemia. 4. Depression. 5. Diabetes type 2  uncontrolled. 6. Severe osteoarthritis.  PLAN: 1. We will admit this gentleman for further workup.  We will get     hepatitis panel and an HIV screen, but since the likelihood of     malignancy is fairly high, we will get a CAT scan of the chest and     the abdomen.  We are in particular concerned about a pancreatic     malignancy given a history of depression and weight loss. 2. We will also check a PSA 3. We will continue treatment of his diabetes with Amaryl and sliding     scale insulin.  We will hold his metformin for the time being. 4. We will discontinue Furosemide for the time being.  He does  have     markedly elevated BUN and creatinine ratio.  We will give     parenteral medications for his pain, but we will hold Tylenol for     the time being because of deranged liver function.  Continue his     antidepressive medications. 5. Other plans as per orders.     Vania Rea, M.D.     LC/MEDQ  D:  02/04/2010  T:  02/04/2010  Job:  [id number]  cc:   [name], M.D. Barbette Hair. Arlyce Dice, MD,FACG  Electronically Signed by Vania Rea M.D. on 02/08/2010 11:56:30 PM

## 2010-02-08 NOTE — Op Note (Signed)
NAME:  Gerald [name], Gerald [name]              ACCOUNT NO.:  [id number]  MEDICAL RECORD NO.:  [id number]          PATIENT TYPE:  INP  LOCATION:  1513                         FACILITY:  [facility]  PHYSICIAN:  Feliberto Gottron. Turner Daniels, M.D.   DATE OF BIRTH:  05-28-1945  DATE OF PROCEDURE:  02/06/2010 DATE OF DISCHARGE:                              OPERATIVE REPORT   PREOPERATIVE DIAGNOSIS:  Left iliopsoas abscess with Gram negative and Gram positive organisms as well as vastus lateralis thigh abscess.  POSTOPERATIVE DIAGNOSES: 1. Left iliopsoas abscess with Gram negative and Gram positive     organisms as well as vastus lateralis abscess. 2. Septic left hip pyarthrosis with collapse of the femoral head.  PROCEDURE:  Radical irrigation and debridement of the left hip joint from an anterior approach including an Girdlestone arthroplasty or removal of the femoral head followed by open irrigation and debridement of vastus lateralis with subcutaneous tunneling up to the hip incision.  SURGEON:  Feliberto Gottron.  Turner Daniels, MD  FIRST ASSISTANT:  Shirl Harris, PA-C.  ANESTHETIC:  General endotracheal.  ESTIMATED BLOOD LOSS:  450 mL.  FLUID REPLACEMENT:  1800 mL of crystalloid.  URINE OUTPUT:  200 mL.  DRAINS PLACED:  Foley catheter and three large bore hemostats, 2 in the anterior hip wound, one going up into the iliopsoas muscle, one down into the abductor space, and then third Hemovac tunneling from the distal lateral aspect of the thigh up to the hip wound.  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  When he came in to have his preoperative evaluation on January 11, 2010, he was afebrile but his hemoglobin which was 11.5 in April had dropped down to 9.  He was sent to the medicine doctor for workup that included a colonoscopy that was negative, although talking to his wife  apparently he bloody stools for 3 days around Christmas and they did find diverticulosis when they did his endoscopy and perhaps he had a bleed through the diverticulosis and possibly seated his blood at that time.  In any event, they brought him into the hospital on the 30th of January 2012 because his hemoglobin dropped down to 7.  A CT scan was accomplished showing changes consistent with either psoas abscess or diabetic myonecrosis.  Earlier today, I put a needle in his thigh and got out 10 mL of purulent material that came back with Gram-positive organisms and Gram negative organisms.  Based on this, we cancelled the Interventional Radiology aspirate the hip or the iliopsoas and proceeded directly to the operating room.  Of interest, this man has never had an elevated white count and his maximum fever has been 100.  For whatever reasons, his immune system does recognize was going on as an infection.  The risks and benefits of surgery discussed at length as well as gravity of the situation.  DESCRIPTION OF PROCEDURE:  Earlier today, the patient did receive IV vancomycin.  He is scheduled to get clindamycin and Rocephin.  He was taken to the Operating  Room after preoperative anesthesia clearance and general anesthesia was induced.  A rolled towel was placed underneath his left hip in the supine position and the left lower extremity prepped, draped in sterile fashion from the ankle to the hemipelvis.  We carefully outlined where the femoral artery was with a marking pen.  A time-out procedure was performed.  We then made anterior approach to the hip joint just about a centimeter medial to the ASIS.  We identified the interval between the tensor fascia lata and the sartorius and exploited this being careful to avoid any damage to the lateral femoral cutaneous nerve.  Once we got into that interval, we dropped down onto the iliopsoas immediately and encountered large amounts of light  green purulent material and easily sucked out about a cup worth of it.  It was easy to run your hand up into the iliac wing, and we irrigated multiple times with pulse lavage to remove purulent material.  The muscle itself seemed to be relatively healthy and did contract when it was struck with Bovie.  At this time, it was also obvious that the purulent material of his way into the hip joint and we elected to proceed with a Girdlestone arthroplasty since at the femoral head collapse anyway.  This was accomplished with a power saw, protecting the posterior structures with a Cobra retractors.  After extracting the femoral head, it was obvious that the part of the anterior pelvis was damaged.  Using rongeurs, we cut back to what appeared to be healthy bone.  We also tracked the purulent material down into the abductor space near the psoas insertion and this was also thoroughly irrigated with normal saline pulse lavage solution as well as hydrogen peroxide.  Once we had completed our sharp debridement and irrigation, we placed a large bore Hemovac drains in the wound, one large bore going up into the iliac wing and one going down to the abductor space.  These were brought out laterally.  We then closed the skin only using running vertical mattress 2-0 nylon suture and dressing of 4x4s and Hypafix was applied to that wound.  Distally on the femur, we made a 6-inch incision starting about 2 inches above the lateral epicondyle just through the skin and subcutaneous tissue and the extension of the IT band and again we immediately encountered purulent material.  It was milked out of the wound.  The muscle of the vastus lateralis did respond to the Bovie and appeared to be alive and there was bleeding noted.  The fluid did track up to the hip wound.  We were able to tunnel another large bore Hemovac up to the hip from the distal wound after again thorough irrigation and debridement.  This wound  was also closed with running 2-0 nylon vertical mattress suture in a similar dressing applied.  We did not send the Gram stain or cultures from this procedure because the Gram stain and cultures from earlier in the day gave [place] Gram-positive cocci and Gram-negative rods.     Feliberto Gottron. Turner Daniels, M.D.     Gerald [name]  D:  02/06/2010  T:  02/06/2010  Job:  [id number]  cc:   Acey Lav, MD Fax: [phone]  [facility]  Electronically Signed by Gean Birchwood M.D. on 02/08/2010 07:52:17 AM

## 2010-02-09 LAB — GLUCOSE, CAPILLARY
Glucose-Capillary: 130 mg/dL — ABNORMAL HIGH (ref 70–99)
Glucose-Capillary: 153 mg/dL — ABNORMAL HIGH (ref 70–99)

## 2010-02-10 ENCOUNTER — Inpatient Hospital Stay (HOSPITAL_COMMUNITY): Payer: BC Managed Care – PPO

## 2010-02-10 LAB — CBC
HCT: 24.8 % — ABNORMAL LOW (ref 39.0–52.0)
Hemoglobin: 8.4 g/dL — ABNORMAL LOW (ref 13.0–17.0)
MCH: 28.5 pg (ref 26.0–34.0)
MCHC: 33.9 g/dL (ref 30.0–36.0)
MCV: 84.1 fL (ref 78.0–100.0)
RBC: 2.95 MIL/uL — ABNORMAL LOW (ref 4.22–5.81)

## 2010-02-10 LAB — DIFFERENTIAL
Basophils Relative: 0 % (ref 0–1)
Lymphocytes Relative: 14 % (ref 12–46)
Lymphs Abs: 1 10*3/uL (ref 0.7–4.0)
Monocytes Absolute: 0.6 10*3/uL (ref 0.1–1.0)
Monocytes Relative: 8 % (ref 3–12)
Neutro Abs: 5.2 10*3/uL (ref 1.7–7.7)

## 2010-02-10 LAB — COMPREHENSIVE METABOLIC PANEL
BUN: 9 mg/dL (ref 6–23)
CO2: 28 mEq/L (ref 19–32)
Calcium: 8.6 mg/dL (ref 8.4–10.5)
Chloride: 108 mEq/L (ref 96–112)
Creatinine, Ser: 0.81 mg/dL (ref 0.4–1.5)
GFR calc non Af Amer: >60 mL/min (ref >60)
Glucose, Bld: 116 mg/dL — ABNORMAL HIGH (ref 70–99)
Total Bilirubin: 0.9 mg/dL (ref 0.3–1.2)

## 2010-02-10 LAB — GLUCOSE, CAPILLARY
Glucose-Capillary: 122 mg/dL — ABNORMAL HIGH (ref 70–99)
Glucose-Capillary: 116 mg/dL — ABNORMAL HIGH (ref 70–99)

## 2010-02-10 LAB — BRAIN NATRIURETIC PEPTIDE: Pro B Natriuretic peptide (BNP): 210 pg/mL — ABNORMAL HIGH (ref 0.0–100.0)

## 2010-02-10 MED ORDER — IOHEXOL 300 MG/ML  SOLN
100.0000 mL | Freq: Once | INTRAMUSCULAR | Status: AC | PRN
  Administered 2010-02-10: 100 mL via INTRAVENOUS

## 2010-02-11 LAB — GLUCOSE, CAPILLARY
Glucose-Capillary: 128 mg/dL — ABNORMAL HIGH (ref 70–99)
Glucose-Capillary: 172 mg/dL — ABNORMAL HIGH (ref 70–99)
Glucose-Capillary: 137 mg/dL — ABNORMAL HIGH (ref 70–99)

## 2010-02-11 LAB — VANCOMYCIN, TROUGH: Vancomycin Tr: 20.7 ug/mL — ABNORMAL HIGH (ref 10.0–20.0)

## 2010-02-12 ENCOUNTER — Other Ambulatory Visit: Payer: Self-pay | Admitting: Gastroenterology

## 2010-02-12 ENCOUNTER — Encounter: Payer: Self-pay | Admitting: Gastroenterology

## 2010-02-12 DIAGNOSIS — K6812 Psoas muscle abscess: Secondary | ICD-10-CM

## 2010-02-12 LAB — GLUCOSE, CAPILLARY: Glucose-Capillary: 116 mg/dL — ABNORMAL HIGH (ref 70–99)

## 2010-02-12 LAB — CULTURE, ROUTINE-ABSCESS

## 2010-02-12 LAB — BRAIN NATRIURETIC PEPTIDE: Pro B Natriuretic peptide (BNP): 30.2 pg/mL (ref 0.0–100.0)

## 2010-02-12 LAB — BASIC METABOLIC PANEL
CO2: 28 mEq/L (ref 19–32)
Calcium: 8.3 mg/dL — ABNORMAL LOW (ref 8.4–10.5)
Creatinine, Ser: 0.74 mg/dL (ref 0.4–1.5)
GFR calc Af Amer: >60 mL/min (ref >60)
GFR calc non Af Amer: >60 mL/min (ref >60)
Sodium: 139 mEq/L (ref 135–145)

## 2010-02-12 LAB — HEMOGLOBIN AND HEMATOCRIT, BLOOD: Hemoglobin: 8.2 g/dL — ABNORMAL LOW (ref 13.0–17.0)

## 2010-02-13 DIAGNOSIS — M7989 Other specified soft tissue disorders: Secondary | ICD-10-CM

## 2010-02-13 LAB — GLUCOSE, CAPILLARY
Glucose-Capillary: 171 mg/dL — ABNORMAL HIGH (ref 70–99)
Glucose-Capillary: 117 mg/dL — ABNORMAL HIGH (ref 70–99)
Glucose-Capillary: 153 mg/dL — ABNORMAL HIGH (ref 70–99)

## 2010-02-18 ENCOUNTER — Encounter: Payer: Self-pay | Admitting: Gastroenterology

## 2010-02-20 ENCOUNTER — Other Ambulatory Visit: Payer: Self-pay | Admitting: Gastroenterology

## 2010-02-20 DIAGNOSIS — R748 Abnormal levels of other serum enzymes: Secondary | ICD-10-CM

## 2010-02-22 ENCOUNTER — Other Ambulatory Visit (HOSPITAL_COMMUNITY): Payer: BC Managed Care – PPO

## 2010-02-25 NOTE — Discharge Summary (Signed)
NAME:  Gerald [name], Gerald [name]              ACCOUNT NO.:  [id number]  MEDICAL RECORD NO.:  [id number]           PATIENT TYPE:  I  LOCATION:  1513                         FACILITY:  [facility]  PHYSICIAN:  Jeoffrey Massed, MD    DATE OF BIRTH:  05-02-1945  DATE OF ADMISSION:  02/04/2010 DATE OF DISCHARGE:                        DISCHARGE SUMMARY - REFERRING   PRIMARY CARE PRACTITIONER:  Gerald Cowboy, MD  PRIMARY ORTHOPEDIC SURGEON:  Feliberto Gottron. Turner Daniels, MD  PRIMARY GASTROENTEROLOGIST:  Dr. Arlyce Dice from [facility].  PRIMARY DISCHARGE DIAGNOSES: 1. 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 2. Septic left hip pyarthrosis with collapse of femoral head. 3. Status post radical irrigation and debridement of the left hip     joint from an anterior approach including Girdlestone arthroplasty     followed by open irrigation and debridement of the vastus lateralis     with subcutaneous tunneling up to the hip incision. 4. Anemia probably secondary to critical illness and chronic disease     status post 2 units of packed red blood cells. 5. Likely antibiotic associated diarrhea, resolving. 6. Uncontrolled hypertension. 7. Elevated transaminases probably secondary to left iliopsoas and     vastus lateralis abscess.  SECONDARY DISCHARGE DIAGNOSES: 1. Diabetes. 2. History of hypertension. 3. Anemia status post colonoscopy on January 28, 2010, which only     showed scattered diverticula. 4. 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 5. History of osteoarthritis. 6. Dyslipidemia. 7. Gastroesophageal reflux disease. 8. Depression.  DISCHARGE MEDICATIONS: 1. Amlodipine 10 mg 1 tablet p.o. daily. 2. Coreg 3.125 mg 1 tablet p.o. twice daily. 3. Invanz 1 g intravenously daily to take for 4 weeks from February 12, 2010. 4. Flagyl 500 mg p.o. t.i.d. to take for 4 weeks once Invanz is     completed/finished. 5. Furosemide 40 mg 1 tablet p.o. daily. 6. NovoLog insulin 1 to 5 units  subcutaneously 3 times a day with     meals. 7. Oxycodone 5 mg immediate release tablet p.o. q.4 h p.r.n. 8. MiraLax 17 g p.o. daily. 9. Senokot 2 tablets p.o. q.h.s. 10.Xanax 1 mg p.o. q.h.s. p.r.n. 11.Bupropion  sustained release 150 mg 1 tablet p.o. twice daily. 12.Doxazosin 8 mg 1 tablet p.o. q.h.s. 13.Enalapril 20 mg 1 tablet p.o. twice daily. 14.Fluoxetine 20 mg 1 capsule p.o. q.h.s. 15.Metformin 500 mg 1 tablet p.o. 3 times a day. 16.Multivitamins 1 tablet p.o. daily. 17.Vitamin B12 2000 mcg 1 tablet p.o. twice daily. 18.Vitamin D 5000 units 2 tablets p.o. 3 times a day.  CONSULTATIONS:  Feliberto Gottron. Turner Daniels, M.D. from Orthopedics. Fransisco Hertz, MD from Infectious Disease.  HISTORY OF PRESENT ILLNESS:  The patient is a very pleasant 65 year old African American male with a history of diabetes and gastroesophageal reflux disease and chronic osteoarthritis, who has been having anemia for the past few months and underwent a colonoscopy in January, which only showed scattered diverticulosis.  Over the past 1 or 2 weeks prior to admission, the patient had a very unusual urine color and which is weak and lethargic and was then brought into the primary care's office on the  day of admission for appointment.  Blood work was done, which showed abnormal LFTs and the patient was then sent to the hospital for admission.  The wife also claimed that the patient had significant unintentional weight loss as well.  He was then admitted to the hospitalist service for further evaluation and treatment.  PERTINENT RADIOLOGICAL STUDIES: 1. CT of the chest showed no active disease. 2. CT of the abdomen and pelvis showed fluid and gas collection     throughout the left iliopsoas muscle in the left pelvis and     proximal thigh, suspicious for abscess. 3. CT angiogram of the chest done February 10, 2010 was negative for     pulmonary embolism.  Interval small to moderate-sized bilateral     pleural  effusions and associated lower lobe atelectasis.  PERTINENT LABORATORY DATA: 1. Culture of the abscess is growing moderate Bacteroides     thetaiotaomicron, which is beta-lactamase positive. 2. Clostridium difficile PCR is negative.  PROCEDURES PERFORMED:  The patient underwent radical irrigation and debridement of left hip joint from an anterior approach including Girdlestone arthroplasty or removal of the femoral head followed by open irrigation and debridement of the vastus lateralis with subcutaneous tunneling up to the hip incision by Dr. Turner Daniels on February 06, 2010.  BRIEF HOSPITAL COURSE: 1. Left psoas abscess, left vastus lateralis abscess, left hip septic     arthritis status post incision and drainage and a Girdlestone     arthroplasty.  This patient was sent in from his primary care's     office for abnormal labs.  He actually has chronic osteoarthritis     and has chronic left hip and knee pain.  He was being evaluated by     his primary orthopedic surgeon for surgery.  In the meantime, the     patient was just feeling weak, lethargic, and was losing weight.     When evaluated by the admitting physician in the emergency room     because of the loss of weight, malignancy was considered and a CT     scan of his chest and abdomen and pelvis was done.  The patient     reports of the CT scan of the abdomen and pelvis are noted as     above.  But in short, it basically showed an iliopsoas abscess     extending down to his vastus lateralis area.  He was then seen in     consult by Dr. Turner Daniels from Orthopedics where a bedside aspiration     from his thigh area yielded frank pus.  The patient was then taken     to the OR the same day for the above-noted procedure.  All cultures     have since grown bacteroides.  He was also seen in consult by Dr.     Maurice March from Infectious Disease and he did recommend to start the     patient on empiric vancomycin and Primaxin.  The culture results      have just come back today and noted as above.  I did discuss with     Dr. Maurice March over the phone and he suggested changing the antibiotics     to [facility] for another 4 weeks.  He will need 4 weeks more of Flagyl     after completing the Invanz as well.  The current thinking is that     the abscess possibly developed secondary to a microperforation from  his recent colonoscopy.  Dr. Maurice March recommends an outpatient     colonoscopy to make sure there is no connection to the abscess     area.  Please do note that the CT of the abdomen and pelvis did not     show any bowel pathology.  Again this colonoscopy can be done as an     outpatient.  Currently postoperatively, the patient has done well.     He has now been persistently afebrile.  He still continues to have     a normal WBC count.  Physical therapy services are evaluating the     patient today.  As per Dr. Turner Daniels he is to be only partial     weightbearing with walker.  Current plans are to continue his IV     antibiotics via PICC line and transfer this patient to a skilled     nursing facility for further ongoing care.  He will need to follow     up with Dr. Turner Daniels in 1 week to remove his sutures.  Per Dr. Wadie Lessen     note no hip replacement will be considered for 1 whole year.     Postoperatively, the patient did have shortness of breath although     he was already started on Lovenox.  A CT angiogram of the chest was     done, which did not show any pulmonary embolism.  It showed some     mild pleural effusions and atelectasis. 2. Anemia.  This apparently has been an issue with the patient for the     past few weeks prior to admission.  Upon admission, he was hydrated     and his lowest hemoglobin was around 7.20.  Because he was going     for surgery, he has been transfused 2 units.  Over the past 3 to 4     days, his hemoglobin has been holding around 8.2 to 8.4 range     without a need for further transfusion.  It is suspected that this      anemia is multifactorial with an element of iron deficiency and     chronic disease and critical illness from his hospitalization.     Periodic monitoring of his hemoglobin and hematocrit will need to     be done by the physician at the skilled nursing facility.  As noted     again, he will need a repeat colonoscopy that can be done as an     outpatient. 3. Uncontrolled hypertension.  The patient upon admission and the next     few days was noted to have uncontrolled blood pressure.  His     numerous antihypertensive medications have been adjusted with     relatively well blood pressure control over the past 24 hours.  He     is to continue on this regimen and for that the up titration or     down titration can be done by his physician at the skilled nursing     facility. 4. Elevated transaminases.  At this point, these are presumed to be     secondary to pretty big abscess that he had.  These transaminases     are down trending.  Again, these will need to be periodically     monitored to make sure they have normalized.  Please note that his     CK was normal. 5. Diarrhea.  This developed postoperatively and it was thought to  be     secondary to antibiotic use.  As the PCR for C difficile was sent     and this was negative. 6. Diabetes.  Upon admission, his oral medications were held and the     patient has been placed on a sliding scale regimen with good sugar     control.  On discharge, he will be resumed on his metformin.     However, his glimepiride will still be placed on hold. 7. Constipation.  This was presumed secondary to narcotics.  He is     being maintained on Senokot and MiraLax with good results.  DISPOSITION:  The current plans are for the patient to be transferred to a skilled nursing facility when a bed is available.  FOLLOWUP INSTRUCTIONS: 1. He will need to be continued on Invanz for another 4 more weeks     from February 12, 2010.  Following completion of  Invanz, he will     need another 4 weeks of Flagyl. 2. He will need an outpatient colonoscopy.  Please see discussion     above. 3. He will need to follow up with Dr. Turner Daniels within 1 week to remove     sutures. 4. He will follow up with his primary care practitioner within 1 to 2     weeks upon discharge from the skilled nursing facility.  Total time spent 60 minutes.     Jeoffrey Massed, MD     SG/MEDQ  D:  02/12/2010  T:  02/12/2010  Job:  [id number]  Electronically Signed by Jeoffrey Massed  on 02/25/2010 04:24:46 PM

## 2010-02-27 NOTE — Miscellaneous (Signed)
Summary: Discharge Summary   NAME:  Gerald [name], Gerald [name] NO.:  [id number]      MEDICAL RECORD NO.:  [id number]           PATIENT TYPE:  I      LOCATION:  1513                         FACILITY:  [facility]      PHYSICIAN:  Jeoffrey Massed, MD    DATE OF BIRTH:  Oct 18, 1945      DATE OF ADMISSION:  02/04/2010   DATE OF DISCHARGE:                            DISCHARGE SUMMARY - REFERRING         PRIMARY CARE PRACTITIONER:  [name], MD      PRIMARY ORTHOPEDIC SURGEON:  Feliberto Gottron. Turner Daniels, MD      PRIMARY GASTROENTEROLOGIST:  Dr. Arlyce Dice from [facility].      PRIMARY DISCHARGE DIAGNOSES:   1. 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   2. Septic left hip pyarthrosis with collapse of femoral head.   3. Status post radical irrigation and debridement of the left hip       joint from an anterior approach including Girdlestone arthroplasty       followed by open irrigation and debridement of the vastus lateralis       with subcutaneous tunneling up to the hip incision.   4. Anemia probably secondary to critical illness and chronic disease       status post 2 units of packed red blood cells.   5. Likely antibiotic associated diarrhea, resolving.   6. Uncontrolled hypertension.   7. Elevated transaminases probably secondary to left iliopsoas and       vastus lateralis abscess.      SECONDARY DISCHARGE DIAGNOSES:   1. Diabetes.   2. History of hypertension.   3. Anemia status post colonoscopy on January 28, 2010, which only       showed scattered diverticula.   4. 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   5. History of osteoarthritis.   6. Dyslipidemia.   7. Gastroesophageal reflux disease.   8. Depression.      DISCHARGE MEDICATIONS:   1. Amlodipine 10 mg 1 tablet p.o. daily.   2. Coreg 3.125 mg 1 tablet p.o. twice daily.   3. Invanz 1 g intravenously daily to take for 4 weeks from February 12, 2010.   4. Flagyl 500 mg p.o. t.i.d. to take  for 4 weeks once Invanz is       completed/finished.   5. Furosemide 40 mg 1 tablet p.o. daily.   6. NovoLog insulin 1 to 5 units subcutaneously 3 times a day with       meals.   7. Oxycodone 5 mg immediate release tablet p.o. q.4 h p.r.n.   8. MiraLax 17 g p.o. daily.   9. Senokot 2 tablets p.o. q.h.s.   10.Xanax 1 mg p.o. q.h.s. p.r.n.   11.Bupropion  sustained release 150 mg 1 tablet p.o. twice daily.   12.Doxazosin 8 mg 1 tablet p.o. q.h.s.   13.Enalapril 20 mg 1 tablet p.o. twice daily.   14.Fluoxetine 20 mg 1 capsule p.o. q.h.s.   15.Metformin 500 mg 1 tablet  p.o. 3 times a day.   16.Multivitamins 1 tablet p.o. daily.   17.Vitamin B12 2000 mcg 1 tablet p.o. twice daily.   18.Vitamin D 5000 units 2 tablets p.o. 3 times a day.      CONSULTATIONS:  Feliberto Gottron. Turner Daniels, M.D. from Orthopedics.   Fransisco Hertz, MD from Infectious Disease.      HISTORY OF PRESENT ILLNESS:  The patient is a very pleasant 65 year old   African American male with a history of diabetes and gastroesophageal   reflux disease and chronic osteoarthritis, who has been having anemia   for the past few months and underwent a colonoscopy in January, which   only showed scattered diverticulosis.  Over the past 1 or 2 weeks prior   to admission, the patient had a very unusual urine color and which is   weak and lethargic and was then brought into the primary care's office   on the day of admission for appointment.  Blood work was done, which   showed abnormal LFTs and the patient was then sent to the hospital for   admission.  The wife also claimed that the patient had significant   unintentional weight loss as well.  He was then admitted to the   hospitalist service for further evaluation and treatment.      PERTINENT RADIOLOGICAL STUDIES:   1. CT of the chest showed no active disease.   2. CT of the abdomen and pelvis showed fluid and gas collection       throughout the left iliopsoas muscle in the left pelvis  and       proximal thigh, suspicious for abscess.   3. CT angiogram of the chest done February 10, 2010 was negative for       pulmonary embolism.  Interval small to moderate-sized bilateral       pleural effusions and associated lower lobe atelectasis.      PERTINENT LABORATORY DATA:   1. Culture of the abscess is growing moderate Bacteroides       thetaiotaomicron, which is beta-lactamase positive.   2. Clostridium difficile PCR is negative.      PROCEDURES PERFORMED:  The patient underwent radical irrigation and   debridement of left hip joint from an anterior approach including   Girdlestone arthroplasty or removal of the femoral head followed by open   irrigation and debridement of the vastus lateralis with subcutaneous   tunneling up to the hip incision by Dr. Turner Daniels on February 06, 2010.      BRIEF HOSPITAL COURSE:   1. Left psoas abscess, left vastus lateralis abscess, left hip septic       arthritis status post incision and drainage and a Girdlestone       arthroplasty.  This patient was sent in from his primary care's       office for abnormal labs.  He actually has chronic osteoarthritis       and has chronic left hip and knee pain.  He was being evaluated by       his primary orthopedic surgeon for surgery.  In the meantime, the       patient was just feeling weak, lethargic, and was losing weight.       When evaluated by the admitting physician in the emergency room       because of the loss of weight, malignancy was considered and a CT       scan of his chest and abdomen and pelvis was  done.  The patient       reports of the CT scan of the abdomen and pelvis are noted as       above.  But in short, it basically showed an iliopsoas abscess       extending down to his vastus lateralis area.  He was then seen in       consult by Dr. Turner Daniels from Orthopedics where a bedside aspiration       from his thigh area yielded frank pus.  The patient was then taken       to the OR the  same day for the above-noted procedure.  All cultures       have since grown bacteroides.  He was also seen in consult by Dr.       Maurice March from Infectious Disease and he did recommend to start the       patient on empiric vancomycin and Primaxin.  The culture results       have just come back today and noted as above.  I did discuss with       Dr. Maurice March over the phone and he suggested changing the antibiotics       to [facility] for another 4 weeks.  He will need 4 weeks more of Flagyl       after completing the Invanz as well.  The current thinking is that       the abscess possibly developed secondary to a microperforation from       his recent colonoscopy.  Dr. Maurice March recommends an outpatient       colonoscopy to make sure there is no connection to the abscess       area.  Please do note that the CT of the abdomen and pelvis did not       show any bowel pathology.  Again this colonoscopy can be done as an       outpatient.  Currently postoperatively, the patient has done well.       He has now been persistently afebrile.  He still continues to have       a normal WBC count.  Physical therapy services are evaluating the       patient today.  As per Dr. Turner Daniels he is to be only partial       weightbearing with walker.  Current plans are to continue his IV       antibiotics via PICC line and transfer this patient to a skilled       nursing facility for further ongoing care.  He will need to follow       up with Dr. Turner Daniels in 1 week to remove his sutures.  Per Dr. Wadie Lessen       note no hip replacement will be considered for 1 whole year.       Postoperatively, the patient did have shortness of breath although       he was already started on Lovenox.  A CT angiogram of the chest was       done, which did not show any pulmonary embolism.  It showed some       mild pleural effusions and atelectasis.   2. Anemia.  This apparently has been an issue with the patient for the       past few weeks prior to  admission.  Upon admission, he was hydrated       and his lowest hemoglobin was around 7.20.  Because he was going       for surgery, he has been transfused 2 units.  Over the past 3 to 4       days, his hemoglobin has been holding around 8.2 to 8.4 range       without a need for further transfusion.  It is suspected that this       anemia is multifactorial with an element of iron deficiency and       chronic disease and critical illness from his hospitalization.       Periodic monitoring of his hemoglobin and hematocrit will need to       be done by the physician at the skilled nursing facility.  As noted       again, he will need a repeat colonoscopy that can be done as an       outpatient.   3. Uncontrolled hypertension.  The patient upon admission and the next       few days was noted to have uncontrolled blood pressure.  His       numerous antihypertensive medications have been adjusted with       relatively well blood pressure control over the past 24 hours.  He       is to continue on this regimen and for that the up titration or       down titration can be done by his physician at the skilled nursing       facility.   4. Elevated transaminases.  At this point, these are presumed to be       secondary to pretty big abscess that he had.  These transaminases       are down trending.  Again, these will need to be periodically       monitored to make sure they have normalized.  Please note that his       CK was normal.   5. Diarrhea.  This developed postoperatively and it was thought to be       secondary to antibiotic use.  As the PCR for C difficile was sent       and this was negative.   6. Diabetes.  Upon admission, his oral medications were held and the       patient has been placed on a sliding scale regimen with good sugar       control.  On discharge, he will be resumed on his metformin.       However, his glimepiride will still be placed on hold.   7. Constipation.  This was  presumed secondary to narcotics.  He is       being maintained on Senokot and MiraLax with good results.      DISPOSITION:  The current plans are for the patient to be transferred to   a skilled nursing facility when a bed is available.      FOLLOWUP INSTRUCTIONS:   1. He will need to be continued on Invanz for another 4 more weeks       from February 12, 2010.  Following completion of Invanz, he will       need another 4 weeks of Flagyl.   2. He will need an outpatient colonoscopy.  Please see discussion       above.   3. He will need to follow up with Dr. Turner Daniels within 1 week to remove       sutures.   4. He will follow up  with his primary care practitioner within 1 to 2       weeks upon discharge from the skilled nursing facility.      Total time spent 60 minutes.               Jeoffrey Massed, MD               SG/MEDQ  D:  02/12/2010  T:  02/12/2010  Job:  [id number]  Clinical Lists Changes

## 2010-03-04 ENCOUNTER — Telehealth (INDEPENDENT_AMBULATORY_CARE_PROVIDER_SITE_OTHER): Payer: Self-pay | Admitting: *Deleted

## 2010-03-06 ENCOUNTER — Ambulatory Visit
Admission: RE | Admit: 2010-03-06 | Discharge: 2010-03-06 | Disposition: A | Discharge status: Home / Self Care | Payer: BC Managed Care – PPO | Source: Ambulatory Visit | Attending: Orthopedic Surgery | Admitting: Orthopedic Surgery

## 2010-03-06 ENCOUNTER — Other Ambulatory Visit: Payer: Self-pay | Admitting: Orthopedic Surgery

## 2010-03-06 DIAGNOSIS — B999 Unspecified infectious disease: Secondary | ICD-10-CM

## 2010-03-06 MED ORDER — IOHEXOL 300 MG/ML  SOLN
100.0000 mL | Freq: Once | INTRAMUSCULAR | Status: AC | PRN
  Administered 2010-03-06: 100 mL via INTRAVENOUS

## 2010-03-07 ENCOUNTER — Inpatient Hospital Stay (HOSPITAL_COMMUNITY): Payer: BC Managed Care – PPO

## 2010-03-07 ENCOUNTER — Inpatient Hospital Stay (HOSPITAL_COMMUNITY)
Admission: AD | Admit: 2010-03-07 | Discharge: 2010-03-20 | DRG: 477 | Disposition: A | Discharge status: Skilled Nursing Facility | Payer: BC Managed Care – PPO | Source: Ambulatory Visit | Attending: Orthopedic Surgery | Admitting: Orthopedic Surgery

## 2010-03-07 DIAGNOSIS — Z96649 Presence of unspecified artificial hip joint: Secondary | ICD-10-CM

## 2010-03-07 DIAGNOSIS — M9080 Osteopathy in diseases classified elsewhere, unspecified site: Secondary | ICD-10-CM | POA: Diagnosis present

## 2010-03-07 DIAGNOSIS — M1990 Unspecified osteoarthritis, unspecified site: Secondary | ICD-10-CM | POA: Diagnosis present

## 2010-03-07 DIAGNOSIS — D62 Acute posthemorrhagic anemia: Secondary | ICD-10-CM | POA: Diagnosis present

## 2010-03-07 DIAGNOSIS — I10 Essential (primary) hypertension: Secondary | ICD-10-CM | POA: Diagnosis present

## 2010-03-07 DIAGNOSIS — F329 Major depressive disorder, single episode, unspecified: Secondary | ICD-10-CM | POA: Diagnosis present

## 2010-03-07 DIAGNOSIS — E785 Hyperlipidemia, unspecified: Secondary | ICD-10-CM | POA: Diagnosis present

## 2010-03-07 DIAGNOSIS — F3289 Other specified depressive episodes: Secondary | ICD-10-CM | POA: Diagnosis present

## 2010-03-07 DIAGNOSIS — K6812 Psoas muscle abscess: Principal | ICD-10-CM | POA: Diagnosis present

## 2010-03-07 DIAGNOSIS — K219 Gastro-esophageal reflux disease without esophagitis: Secondary | ICD-10-CM | POA: Diagnosis present

## 2010-03-07 LAB — ABO/RH: ABO/RH(D): O POS

## 2010-03-07 LAB — SURGICAL PCR SCREEN
MRSA, PCR: NEGATIVE
Staphylococcus aureus: NEGATIVE

## 2010-03-08 ENCOUNTER — Inpatient Hospital Stay (HOSPITAL_COMMUNITY)
Admission: RE | Admit: 2010-03-08 | Payer: Managed Care, Other (non HMO) | Source: Ambulatory Visit | Admitting: Orthopedic Surgery

## 2010-03-08 DIAGNOSIS — K6812 Psoas muscle abscess: Secondary | ICD-10-CM

## 2010-03-08 LAB — DIFFERENTIAL
Eosinophils Absolute: 0.2 10*3/uL (ref 0.0–0.7)
Eosinophils Relative: 3 % (ref 0–5)
Lymphocytes Relative: 35 % (ref 12–46)
Lymphs Abs: 2.2 10*3/uL (ref 0.7–4.0)
Monocytes Absolute: 0.6 10*3/uL (ref 0.1–1.0)

## 2010-03-08 LAB — COMPREHENSIVE METABOLIC PANEL
Alkaline Phosphatase: 76 U/L (ref 39–117)
BUN: 11 mg/dL (ref 6–23)
CO2: 25 mEq/L (ref 19–32)
Chloride: 112 mEq/L (ref 96–112)
Creatinine, Ser: 0.84 mg/dL (ref 0.4–1.5)
GFR calc non Af Amer: >60 mL/min (ref >60)
Glucose, Bld: 108 mg/dL — ABNORMAL HIGH (ref 70–99)
Potassium: 3.6 mEq/L (ref 3.5–5.1)
Total Bilirubin: 0.6 mg/dL (ref 0.3–1.2)

## 2010-03-08 LAB — CBC
HCT: 24.1 % — ABNORMAL LOW (ref 39.0–52.0)
MCH: 28.1 pg (ref 26.0–34.0)
MCHC: 34 g/dL (ref 30.0–36.0)
MCHC: 33.9 g/dL (ref 30.0–36.0)
MCV: 82.5 fL (ref 78.0–100.0)
Platelets: 177 10*3/uL (ref 150–400)
Platelets: 181 10*3/uL (ref 150–400)
RBC: 2.74 MIL/uL — ABNORMAL LOW (ref 4.22–5.81)
RDW: 16 % — ABNORMAL HIGH (ref 11.5–15.5)

## 2010-03-08 LAB — GLUCOSE, CAPILLARY
Glucose-Capillary: 137 mg/dL — ABNORMAL HIGH (ref 70–99)
Glucose-Capillary: 137 mg/dL — ABNORMAL HIGH (ref 70–99)
Glucose-Capillary: 98 mg/dL (ref 70–99)

## 2010-03-08 LAB — PREPARE RBC (CROSSMATCH)

## 2010-03-08 LAB — GRAM STAIN

## 2010-03-09 LAB — GLUCOSE, CAPILLARY
Glucose-Capillary: 144 mg/dL — ABNORMAL HIGH (ref 70–99)
Glucose-Capillary: 157 mg/dL — ABNORMAL HIGH (ref 70–99)
Glucose-Capillary: 136 mg/dL — ABNORMAL HIGH (ref 70–99)

## 2010-03-09 LAB — CBC
Hemoglobin: 7.9 g/dL — ABNORMAL LOW (ref 13.0–17.0)
MCH: 28.4 pg (ref 26.0–34.0)
MCH: 28.8 pg (ref 26.0–34.0)
MCV: 83.5 fL (ref 78.0–100.0)
Platelets: 152 10*3/uL (ref 150–400)
RBC: 2.74 MIL/uL — ABNORMAL LOW (ref 4.22–5.81)
RDW: 15.6 % — ABNORMAL HIGH (ref 11.5–15.5)
WBC: 6.1 10*3/uL (ref 4.0–10.5)

## 2010-03-09 LAB — PREPARE RBC (CROSSMATCH)

## 2010-03-09 LAB — BASIC METABOLIC PANEL
Chloride: 110 mEq/L (ref 96–112)
Creatinine, Ser: 1.13 mg/dL (ref 0.4–1.5)
GFR calc Af Amer: >60 mL/min (ref >60)
Potassium: 3.9 mEq/L (ref 3.5–5.1)

## 2010-03-10 LAB — GLUCOSE, CAPILLARY
Glucose-Capillary: 90 mg/dL (ref 70–99)
Glucose-Capillary: 151 mg/dL — ABNORMAL HIGH (ref 70–99)
Glucose-Capillary: 78 mg/dL (ref 70–99)
Glucose-Capillary: 91 mg/dL (ref 70–99)

## 2010-03-10 LAB — CROSSMATCH
Unit division: 0
Unit division: 0

## 2010-03-10 LAB — CBC
MCV: 83.4 fL (ref 78.0–100.0)
Platelets: 110 10*3/uL — ABNORMAL LOW (ref 150–400)
RBC: 2.71 MIL/uL — ABNORMAL LOW (ref 4.22–5.81)
WBC: 5.8 10*3/uL (ref 4.0–10.5)

## 2010-03-10 LAB — BASIC METABOLIC PANEL
BUN: 14 mg/dL (ref 6–23)
Chloride: 112 mEq/L (ref 96–112)
GFR calc Af Amer: >60 mL/min (ref >60)
GFR calc non Af Amer: >60 mL/min (ref >60)
Potassium: 3.6 mEq/L (ref 3.5–5.1)
Sodium: 140 mEq/L (ref 135–145)

## 2010-03-10 LAB — PROTIME-INR
INR: 1.29 (ref 0.00–1.49)
Prothrombin Time: 16.3 seconds — ABNORMAL HIGH (ref 11.6–15.2)

## 2010-03-11 LAB — GLUCOSE, CAPILLARY
Comment 3: 331831
Glucose-Capillary: 155 mg/dL — ABNORMAL HIGH (ref 70–99)
Glucose-Capillary: 118 mg/dL — ABNORMAL HIGH (ref 70–99)

## 2010-03-11 LAB — CBC
Hemoglobin: 7.9 g/dL — ABNORMAL LOW (ref 13.0–17.0)
MCH: 29 pg (ref 26.0–34.0)
MCHC: 34.3 g/dL (ref 30.0–36.0)
Platelets: 129 10*3/uL — ABNORMAL LOW (ref 150–400)
RDW: 15.4 % (ref 11.5–15.5)

## 2010-03-11 LAB — TISSUE CULTURE

## 2010-03-11 LAB — CULTURE, ROUTINE-ABSCESS: Culture: NO GROWTH

## 2010-03-11 LAB — PROTIME-INR
INR: 1.36 (ref 0.00–1.49)
Prothrombin Time: 17 seconds — ABNORMAL HIGH (ref 11.6–15.2)

## 2010-03-12 LAB — CBC
HCT: 22.2 % — ABNORMAL LOW (ref 39.0–52.0)
Hemoglobin: 7.6 g/dL — ABNORMAL LOW (ref 13.0–17.0)
MCHC: 34.2 g/dL (ref 30.0–36.0)
MCV: 84.4 fL (ref 78.0–100.0)
RDW: 15.4 % (ref 11.5–15.5)

## 2010-03-12 LAB — POCT I-STAT 4, (NA,K, GLUC, HGB,HCT)
Glucose, Bld: 124 mg/dL — ABNORMAL HIGH (ref 70–99)
Hemoglobin: 8.2 g/dL — ABNORMAL LOW (ref 13.0–17.0)
Potassium: 3.6 mEq/L (ref 3.5–5.1)

## 2010-03-12 LAB — GLUCOSE, CAPILLARY
Glucose-Capillary: 171 mg/dL — ABNORMAL HIGH (ref 70–99)
Glucose-Capillary: 77 mg/dL (ref 70–99)

## 2010-03-12 LAB — PROTIME-INR: INR: 1.49 (ref 0.00–1.49)

## 2010-03-13 DIAGNOSIS — K6812 Psoas muscle abscess: Secondary | ICD-10-CM

## 2010-03-13 LAB — PROTIME-INR
INR: 1.76 — ABNORMAL HIGH (ref 0.00–1.49)
Prothrombin Time: 20.7 seconds — ABNORMAL HIGH (ref 11.6–15.2)

## 2010-03-13 LAB — GLUCOSE, CAPILLARY
Glucose-Capillary: 121 mg/dL — ABNORMAL HIGH (ref 70–99)
Glucose-Capillary: 100 mg/dL — ABNORMAL HIGH (ref 70–99)
Glucose-Capillary: 174 mg/dL — ABNORMAL HIGH (ref 70–99)

## 2010-03-13 LAB — CBC
HCT: 22.2 % — ABNORMAL LOW (ref 39.0–52.0)
Hemoglobin: 7.6 g/dL — ABNORMAL LOW (ref 13.0–17.0)
MCHC: 34.2 g/dL (ref 30.0–36.0)
RDW: 15.5 % (ref 11.5–15.5)
WBC: 5 10*3/uL (ref 4.0–10.5)

## 2010-03-13 LAB — ANAEROBIC CULTURE

## 2010-03-14 LAB — GLUCOSE, CAPILLARY
Glucose-Capillary: 140 mg/dL — ABNORMAL HIGH (ref 70–99)
Glucose-Capillary: 202 mg/dL — ABNORMAL HIGH (ref 70–99)
Glucose-Capillary: 111 mg/dL — ABNORMAL HIGH (ref 70–99)
Glucose-Capillary: 121 mg/dL — ABNORMAL HIGH (ref 70–99)

## 2010-03-14 NOTE — Progress Notes (Signed)
  Phone Note Other Incoming   Request: Send information Summary of Call: Request for records received from [facility]. Request forwarded to [employer].  1/11 to present-D. Brodie. Received 03/01/2010

## 2010-03-15 LAB — PROTIME-INR
INR: 2.03 — ABNORMAL HIGH (ref 0.00–1.49)
Prothrombin Time: 23.1 seconds — ABNORMAL HIGH (ref 11.6–15.2)

## 2010-03-15 LAB — DIFFERENTIAL
Basophils Absolute: 0 10*3/uL (ref 0.0–0.1)
Eosinophils Relative: 4 % (ref 0–5)
Lymphocytes Relative: 32 % (ref 12–46)
Lymphs Abs: 1.9 10*3/uL (ref 0.7–4.0)
Neutro Abs: 3.2 10*3/uL (ref 1.7–7.7)
Neutrophils Relative %: 54 % (ref 43–77)

## 2010-03-15 LAB — BASIC METABOLIC PANEL
Chloride: 109 mEq/L (ref 96–112)
GFR calc non Af Amer: >60 mL/min (ref >60)
Glucose, Bld: 79 mg/dL (ref 70–99)
Potassium: 3.4 mEq/L — ABNORMAL LOW (ref 3.5–5.1)
Sodium: 139 mEq/L (ref 135–145)

## 2010-03-15 LAB — CBC
HCT: 23.5 % — ABNORMAL LOW (ref 39.0–52.0)
Platelets: 196 10*3/uL (ref 150–400)
RBC: 2.76 MIL/uL — ABNORMAL LOW (ref 4.22–5.81)
RDW: 15.6 % — ABNORMAL HIGH (ref 11.5–15.5)
WBC: 6 10*3/uL (ref 4.0–10.5)

## 2010-03-15 LAB — GLUCOSE, CAPILLARY: Glucose-Capillary: 135 mg/dL — ABNORMAL HIGH (ref 70–99)

## 2010-03-15 LAB — MAGNESIUM: Magnesium: 1.7 mg/dL (ref 1.5–2.5)

## 2010-03-16 LAB — GLUCOSE, CAPILLARY
Glucose-Capillary: 96 mg/dL (ref 70–99)
Glucose-Capillary: 115 mg/dL — ABNORMAL HIGH (ref 70–99)
Glucose-Capillary: 158 mg/dL — ABNORMAL HIGH (ref 70–99)

## 2010-03-16 LAB — PROTIME-INR: INR: 1.97 — ABNORMAL HIGH (ref 0.00–1.49)

## 2010-03-17 ENCOUNTER — Inpatient Hospital Stay (HOSPITAL_COMMUNITY): Payer: BC Managed Care – PPO

## 2010-03-17 LAB — GLUCOSE, CAPILLARY: Glucose-Capillary: 153 mg/dL — ABNORMAL HIGH (ref 70–99)

## 2010-03-17 LAB — CBC
MCH: 28.6 pg (ref 26.0–34.0)
MCHC: 33.2 g/dL (ref 30.0–36.0)
MCV: 86.1 fL (ref 78.0–100.0)
Platelets: 247 10*3/uL (ref 150–400)
RDW: 15.7 % — ABNORMAL HIGH (ref 11.5–15.5)

## 2010-03-17 LAB — BASIC METABOLIC PANEL
BUN: 8 mg/dL (ref 6–23)
CO2: 25 mEq/L (ref 19–32)
Calcium: 8.8 mg/dL (ref 8.4–10.5)
Creatinine, Ser: 0.76 mg/dL (ref 0.4–1.5)
Glucose, Bld: 91 mg/dL (ref 70–99)

## 2010-03-18 LAB — CBC
MCV: 86 fL (ref 78.0–100.0)
Platelets: 238 10*3/uL (ref 150–400)
RBC: 2.79 MIL/uL — ABNORMAL LOW (ref 4.22–5.81)
WBC: 7 10*3/uL (ref 4.0–10.5)

## 2010-03-18 LAB — GLUCOSE, CAPILLARY: Glucose-Capillary: 76 mg/dL (ref 70–99)

## 2010-03-18 LAB — BASIC METABOLIC PANEL
Chloride: 111 mEq/L (ref 96–112)
Creatinine, Ser: 0.8 mg/dL (ref 0.4–1.5)
GFR calc Af Amer: >60 mL/min (ref >60)
Sodium: 140 mEq/L (ref 135–145)

## 2010-03-18 LAB — DIFFERENTIAL
Eosinophils Absolute: 0.2 10*3/uL (ref 0.0–0.7)
Lymphocytes Relative: 29 % (ref 12–46)
Lymphs Abs: 2 10*3/uL (ref 0.7–4.0)
Neutrophils Relative %: 60 % (ref 43–77)

## 2010-03-18 LAB — MAGNESIUM: Magnesium: 1.9 mg/dL (ref 1.5–2.5)

## 2010-03-18 LAB — RETICULOCYTES
Retic Count, Absolute: 61.4 10*3/uL (ref 19.0–186.0)
Retic Ct Pct: 2.2 % (ref 0.4–3.1)

## 2010-03-18 LAB — PROTIME-INR: INR: 1.97 — ABNORMAL HIGH (ref 0.00–1.49)

## 2010-03-18 LAB — HEMOGLOBIN A1C
Hgb A1c MFr Bld: 5.6 % (ref <5.7)
Mean Plasma Glucose: 114 mg/dL (ref <117)

## 2010-03-18 LAB — IRON: Iron: 24 ug/dL — ABNORMAL LOW (ref 42–135)

## 2010-03-19 LAB — GLUCOSE, CAPILLARY
Glucose-Capillary: 116 mg/dL — ABNORMAL HIGH (ref 70–99)
Glucose-Capillary: 131 mg/dL — ABNORMAL HIGH (ref 70–99)

## 2010-03-20 LAB — FERRITIN: Ferritin: 537 ng/mL — ABNORMAL HIGH (ref 22–322)

## 2010-03-20 LAB — GLUCOSE, CAPILLARY: Glucose-Capillary: 169 mg/dL — ABNORMAL HIGH (ref 70–99)

## 2010-03-20 LAB — PROTIME-INR
INR: 1.78 — ABNORMAL HIGH (ref 0.00–1.49)
Prothrombin Time: 20.9 seconds — ABNORMAL HIGH (ref 11.6–15.2)

## 2010-03-20 LAB — IRON AND TIBC
Iron: 31 ug/dL — ABNORMAL LOW (ref 42–135)
TIBC: 131 ug/dL — ABNORMAL LOW (ref 215–435)
UIBC: 100 ug/dL

## 2010-03-20 LAB — CBC
MCHC: 32.1 g/dL (ref 30.0–36.0)
RDW: 15.7 % — ABNORMAL HIGH (ref 11.5–15.5)

## 2010-03-20 NOTE — Discharge Summary (Signed)
  NAME:  Gerald [name], Gerald [name]              ACCOUNT NO.:  [id number]  MEDICAL RECORD NO.:  [id number]           PATIENT TYPE:  LOCATION:                                 FACILITY:  PHYSICIAN:  Feliberto Gottron. Turner Daniels, M.D.   DATE OF BIRTH:  August 10, 1945  DATE OF ADMISSION: DATE OF DISCHARGE:                              DISCHARGE SUMMARY   ADDENDUM:  The patient remained at [facility] and they awaited available spot in rehab.  On March 18, 2010, he was medically stable and was discharged to skilled nursing.  They would manage his Coumadin therapy. His target INR is 1.5 to 2.0.     Shirl Harris, PA   ______________________________ Feliberto Gottron. Turner Daniels, M.D.    JW/MEDQ  D:  03/18/2010  T:  03/18/2010  Job:  [id number]  Electronically Signed by Shirl Harris PA on 03/19/2010 08:40:25 AM Electronically Signed by Gean Birchwood M.D. on 03/20/2010 10:57:01 PM

## 2010-03-20 NOTE — Discharge Summary (Signed)
  NAME:  Gerald [name], Gerald [name]              ACCOUNT NO.:  [id number]  MEDICAL RECORD NO.:  [id number]           PATIENT TYPE:  I  LOCATION:  2899                         FACILITY:  [facility]  PHYSICIAN:  Feliberto Gottron. Turner Daniels, M.D.   DATE OF BIRTH:  [birth year]  DATE OF ADMISSION:  03/07/2010 DATE OF DISCHARGE:                              DISCHARGE SUMMARY   ADDENDUM Mr. [name] remained stable and had no change in his condition, but unfortunately there was no space available for him in rehab, so he remained at [facility] until his discharge on March 20, 2010.     Shirl Harris, PA   ______________________________ Feliberto Gottron. Turner Daniels, M.D.    JW/MEDQ  D:  03/20/2010  T:  03/20/2010  Job:  [id number]  Electronically Signed by Shirl Harris PA on 03/20/2010 12:53:32 PM Electronically Signed by Gean Birchwood M.D. on 03/20/2010 10:57:16 PM

## 2010-03-20 NOTE — Discharge Summary (Signed)
NAME:  Gerald [name], Gerald [name]              ACCOUNT NO.:  [id number]  MEDICAL RECORD NO.:  [id number]           PATIENT TYPE:  I  LOCATION:  2899                         FACILITY:  [facility]  PHYSICIAN:  Feliberto Gottron. Turner Daniels, M.D.   DATE OF BIRTH:  [birth year]  DATE OF ADMISSION:  03/07/2010 DATE OF DISCHARGE:  03/14/2010                              DISCHARGE SUMMARY   CHIEF COMPLAINT:  Left hip pain and drainage.  HISTORY OF PRESENT ILLNESS:  This is a 65 year old gentleman who is approximately 1 month status post incision and drainage of a left hip abscess with Girdlestone arthroplasty.  He presented to the office for orthopedic follow up complaining of increased swelling in his left thigh.  Purulent material was aspirated in the clinic and he was readmitted for repeat I and D.  All risks and benefits of surgery were discussed with the patient.  PAST MEDICAL HISTORY:  Significant for, 1. Diabetes. 2. Hypertension. 3. Anemia. 4. Osteoarthritis. 5. Dyslipidemia. 6. Reflux.  PAST SURGICAL HISTORY:  Significant for right total hip arthroplasty and incision and drainage of the left hip.  ALLERGIES:  He has no known drug allergies.  PHYSICAL EXAMINATION:  EXTREMITIES:  Gross examination of the left thigh demonstrates two well-healed surgical incision.  There is swelling and erythema.  A purulent drainage was expressed.  He has intact sensation to light touch and intact distal pulses to the left lower extremity.  IMAGING DATA:  X-rays demonstrate no evidence of osteomyelitis.  A CT scan of the abdomen showed no communication between the hip joint and the abdomen.  HOSPITAL COURSE:  Gerald [name] was admitted to [facility] on March 07, 2010, when he underwent incision and drainage of the left hip. The procedure was performed by Dr. Gean Birchwood and the patient tolerated it well.  On the first postoperative day, he was awake and alert.  [facility] managed his multiple medical  problems.  Infectious Disease placed him on IV Invanz for his antibiotic therapy.  Throughout his hospital stay, his drain output was measured and monitored and he remained medically stable.  On postoperative day #6, his two distal drains were removed without difficulty.  The two proximal drains will stay in for 2 more weeks.  DISPOSITION:  The patient was discharged to a skilled nursing facility on March 14, 2010.  DISCHARGE INSTRUCTIONS:  He is weightbearing as tolerated, but we would like to minimize his activity.  Skilled nursing will manage the wound and leave the drains intact.  He will return to clinic and follow up to see Dr. Turner Daniels in about a week.  He will continue IV Invanz through his PICC line for antibiotic therapy.  Pain medication will be Percocet 5/325 mg 1-2 tablets p.o. q.4-6 h. p.r.n. pain.  No physical therapy will be necessary at this time.  FINAL DIAGNOSES:  Left hip abscess with a secondary diagnosis of acute blood loss anemia status post transfusion of 2 units of packed red blood cells.     Shirl Harris, PA   ______________________________ Feliberto Gottron. Turner Daniels, M.D.    JW/MEDQ  D:  03/14/2010  T:  03/14/2010  Job:  [id number]  Electronically Signed by Shirl Harris PA on 03/19/2010 08:40:19 AM Electronically Signed by Gean Birchwood M.D. on 03/20/2010 10:56:56 PM

## 2010-03-21 NOTE — Consult Note (Signed)
NAME:  Gerald [name], HUTMACHER NO.:  [id number]  MEDICAL RECORD NO.:  [id number]           PATIENT TYPE:  LOCATION:                                 FACILITY:  PHYSICIAN:  Zannie Cove, MD     DATE OF BIRTH:  1945-12-02  DATE OF CONSULTATION: DATE OF DISCHARGE:                                CONSULTATION   PRIMARY CARE PHYSICIAN:  [name], MD  PRIMARY ORTHOPEDIC SURGEON:  Feliberto Gottron. Turner Daniels, MD  GASTROENTEROLOGIST:  Barbette Hair. Arlyce Dice, MD, Clementeen Graham  REASON FOR CONSULTATION:  Medical management.  HISTORY OF PRESENT ILLNESS:  Gerald [name] is a 65 year old gentleman who has had a prolonged hospital course and complicated stay, who was just discharged from the hospital about 3 weeks ago when he was admitted for iliopsoas and left hip pyarthrosis.  He had an I and D done on September 06, 2009, was discharged to skilled nursing facility on Invanz and Flagyl and then subsequently he had a follow up CT scan with recurrent abscess as well as femoral head osteomyelitis and is currently in the PACU after having left hip and thigh I and D.  [facility] have been consulted for medical management.  PAST MEDICAL HISTORY: 1. 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 2. Hypertension. 3. Iliopsoas abscess and pyarthrosis of the left hip. 4. Diabetes. 5. Colonoscopy in January 2012 with diverticulosis. 6. Hypertension. 7. Depression. 8. Osteoarthritis.  MEDICATIONS:  In e-chart reviewed.  SOCIAL HISTORY:  Recently from SNF.  No history of smoking or alcohol.  FAMILY HISTORY:  Significant for mother with Alzheimer dementia.  PHYSICAL EXAMINATION:  VITAL SIGNS:  He is afebrile, pulse is 90-100, blood pressure 125/70, respirations 20, and satting 98% on 2 L. GENERAL:  He is currently sleeping and drowsy from anesthesia, otherwise. CARDIOVASCULAR SYSTEM:  S1, S2 regular rate and rhythm. LUNGS:  With decreased breath sounds in both bases. ABDOMEN:  Soft, nontender,  positive bowel sounds.  No organomegaly. EXTREMITIES:  There is dressing in the right hip and thigh.  LABORATORY DATA:  White count of 8.0, hemoglobin 7.7, and platelets 181. Chemistries are within normal limits.  INR 1.2.  CBG is 123, 137, and 205.  ASSESSMENT/PLAN:  Gerald [name] is a very pleasant 65 year old African American gentleman with, 1. 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  Continue Invanz and Flagyl per     previous IV recommendations.  Per discussion with the PA, it seems     like Infectious Disease has already been consulted.  We will await     culture reports from the OR and continue these antibiotics until     then.  Pain control and DVT prophylaxis per primary service. 2. 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  We will check old CBC dated     today and transfuse hemoglobin less than or equal to 7.  He has had     a colonoscopy last month, which was unremarkable except for     diverticulosis. 3.  Diabetes, stable.  Continue on NovoLog sliding scale, hold     metformin. 4. Hypertension, stable.  Thank you for the consult.  We will follow the patient with you.     Zannie Cove, MD     PJ/MEDQ  D:  03/08/2010  T:  03/08/2010  Job:  [id number]  cc:   Feliberto Gottron. Turner Daniels, M.D.  Electronically Signed by Zannie Cove  on 03/21/2010 05:22:12 PM

## 2010-03-23 LAB — AFB CULTURE WITH SMEAR (NOT AT ARMC): Acid Fast Smear: NONE SEEN

## 2010-03-23 NOTE — Op Note (Signed)
NAME:  Gerald [name], Gerald [name]              ACCOUNT NO.:  [id number]  MEDICAL RECORD NO.:  [id number]           PATIENT TYPE:  I  LOCATION:  2899                         FACILITY:  [facility]  PHYSICIAN:  Feliberto Gottron. Turner Daniels, M.D.   DATE OF BIRTH:  1945/10/29  DATE OF PROCEDURE:  03/08/2010 DATE OF DISCHARGE:                              OPERATIVE REPORT   PREOPERATIVE DIAGNOSIS:  Recurrent abscess of left pelvis hip and thigh.  POSTOPERATIVE DIAGNOSIS:  Recurrent abscess of left pelvis hip and thigh.  PROCEDURE:  Irrigation and debridement of left psoas abscess, left hip abscess, and left thigh abscess.  SURGEON:  Feliberto Gottron. Turner Daniels, MD  FIRST ASSISTANT:  Shirl Harris, PA-C  ANESTHETIC:  General endotracheal.  ESTIMATED BLOOD LOSS:  400 mL.  FLUID REPLACEMENT:  1500 mL of crystalloid.  DRAINS PLACED:  Four large bore Hemovac, one in the psoas abscess, one in the hip joint, one in the abductor compartment, and one in the lateral compartment.  We also sent off intraoperative tissue and fluid cultures.  INDICATIONS FOR PROCEDURE:  The patient is a 65 year old gentleman who underwent irrigation and debridement of a spontaneous psoas, hip, and thigh abscess back in January 2012.  He was seen by ID and also he was on the Medicine Service.  He has been on Invanz since the surgery, but has had recurrent abscess despite leaving the drains in for 3-4 days until that became quiet.  Because the abscess has recurred by CT scan, he was taken for repeat irrigation and debridement.  I did consult Dr. Claud Kelp prior to surgery who recommended concurred with a repeat irrigation and debridement and placement of large drains and leaving them in for about 3 weeks.  Risks and benefits of surgery were discussed at length with both the patient and his wife.  DESCRIPTION OF PROCEDURE:  The patient was identified by an armband and taken to the operating room at [facility].   Appropriate anesthetic monitors were attached.  Endotracheal anesthesia was induced with the patient in supine position.  The left lower extremity was prepped and draped in the usual sterile fashion from the ankle to the hemipelvis.  We began the operation itself by recreating the anterior approach wound that was used back in January about 10 cm in length and extending it a couple of centimeters on either side.  We immediately entered the abscess cavity and set it about removing inflamed subcutaneous tissue.  There was little if any necrotic muscle.  There was a very small amount of necrotic bone along the anterior aspect of the acetabulum where he had a previous defect.  We also debrided up into the psoas cavity and did find purulent material and was sent off for Gram stain and culture and we sent off tissue as well.  We also dissected down to the abductor compartment and towards the lateral compartment removing any necrotic tissue.  We then placed 4 large bore Hemovac drains, one in the psoas cavity, one in the remnant of the hip joint where he had a Girdlestone procedure done back in January and also one in the abductor  canal and the lateral cavity as well.  This was the abductor compartment.  Once the drains had been placed, we continued pulse lavage irrigation using to 2-3 liters bags of fluid and then loosely closed the wound with running vertical mattress 2-0 nylon suture and a dressing of Xeroform, 4 x 4 dressing sponges and Hypafix tape was then placed.  The patient was awakened, extubated, and taken to the recovery room without difficulty.     Feliberto Gottron. Turner Daniels, M.D.     Ovid Curd  D:  03/21/2010  T:  03/22/2010  Job:  [id number]  Electronically Signed by Gean Birchwood M.D. on 03/23/2010 09:30:27 AM

## 2010-04-04 NOTE — Miscellaneous (Signed)
  Clinical Lists Changes Lab work from  [name], M.D. From January, 2012 demonstrated AST of 150, ALT was 326, albumin 2.0 alkaline phosphatase 141  I will obtain an abdominal ultrasound and ask the patient to return for follow. Dr Arlyce Dice Do you want a U/S on this pt??YES Orders: Added new Test order of Ultrasound Abdomen (UAS) - Signed Scheduled patient for Abd. U/S for 02/22/10 @ 2pm @ [facility] with Lyla Son. LMOM for patient at home and his work number to return our call.Graciella Freer RN  February 20, 2010 11:27 AM   Lmom at home for patient to call back. His work stated he is on leave.Graciella Freer RN  February 21, 2010 8:47 AM  Spoke with patient's wife at [phone] about patient's appointment. for  ABD U/S. Per wife patient is in Rehab for hip infection per Discharge Summary routed. Wife stated she can have him sent to [facility] for the U/S, but the Summary listed a probable reason for the increased LFT's. Please advise. Graciella Freer RN  February 21, 2010 9:23 AM   Repeat LFTs after rehab and then decide about ultrasound  Notified wife I will check with her and when patient is discharged, we will repeat LFT's and decide on U/S then. Wife stated understanding. Graciella Freer RN  February 21, 2010 10:06 AM

## 2010-04-11 ENCOUNTER — Encounter: Payer: Self-pay | Admitting: Internal Medicine

## 2010-04-11 ENCOUNTER — Ambulatory Visit (INDEPENDENT_AMBULATORY_CARE_PROVIDER_SITE_OTHER): Payer: BC Managed Care – PPO | Admitting: Internal Medicine

## 2010-04-11 VITALS — BP 112/69 | HR 79 | Temp 97.9°F

## 2010-04-11 DIAGNOSIS — M009 Pyogenic arthritis, unspecified: Secondary | ICD-10-CM

## 2010-04-11 LAB — GLUCOSE, CAPILLARY

## 2010-04-11 MED ORDER — METRONIDAZOLE 500 MG PO TABS: 500.0000 mg | ORAL_TABLET | Freq: Three times a day (TID) | ORAL | Status: DC

## 2010-04-11 MED ORDER — METRONIDAZOLE 500 MG PO TABS: 500.0000 mg | ORAL_TABLET | Freq: Three times a day (TID) | ORAL | Status: AC

## 2010-04-11 NOTE — Assessment & Plan Note (Signed)
Gerald [name] is improving but I am not confident that his severe, complicated left hip infection is cured. I will stop the Invanz, remove his PICC and switch him to oral Flagyl and discuss obtaining a repeat CT scan with Dr. Gean Birchwood before deciding on the optimal duration of antibiotic therapy.

## 2010-04-11 NOTE — Progress Notes (Signed)
  Subjective:    Patient ID: Gerald [name], male    DOB: 14-Aug-1945, 65 y.o.   MRN: [id number]  HPI Mr. Odenthal is a 65 year old gentleman who was admitted to [facility] long hospital in February with a severe left hip infection. He underwent incision and drainage and had a Girdlestone procedure. The operative Gram stain showed mixed bacteria and cultures grew Bacteroides. He was discharged to [facility] skilled nursing facility on IV Invanz. In late February he noted new left hip swelling, wound drainage low-grade fevers and increased hip pain. A hip aspirate on February 29 showed no organisms on Gram stain and the culture was negative. 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 He underwent repeat incision and drainage and the operative Gram stain and cultures were again negative. Felt like his relapse was due to persistent deep infection rather than a resistant organism so he was continued on IV Invanz and discharged back to [facility]. He is in today for his routine followup. He is feeling better. He has not had any problems with his PICC line or the Invanz. One of his 2 drains has been pulled. He has not had any fever, chills, or sweats. His appetite is good and has improved.   Review of Systems     Objective:   Physical Exam  Constitutional: No distress.       He is a very pleasant gentleman sitting in a wheelchair.  Abdominal: Soft. He exhibits no distension. There is no tenderness.  Musculoskeletal:       Left hip: He exhibits tenderness. He exhibits no swelling.       His surgical incision is healing nicely he has a drain in the superior portion of the incision with bloody drainage in the tubing.          Assessment & Plan:

## 2010-04-16 NOTE — Progress Notes (Signed)
Addended by: Jennet Maduro on: 04/16/2010 09:48 AM   Modules accepted: Orders

## 2010-04-29 ENCOUNTER — Telehealth: Payer: Self-pay | Admitting: *Deleted

## 2010-04-29 NOTE — Telephone Encounter (Signed)
LMOM for wife to call- just checking to see if pt is still in rehab or at home. Read Dr Blair Dolphin notes and Dr Arlyce Dice may use the Ct scan ordered rather than U/S.

## 2010-04-29 NOTE — Telephone Encounter (Signed)
Spoke with pt's wife this am who stated pt is home now. He can transport from a wheelchair to walker, but part of his pelvis and femur were removed and the affected leg is 2.5 inches shorter. Pt was unable to have his CT scan on 04/11/10. Pt sees Dr Oneta Rack on 05/08/10 and he may order other tests. HGB remains 8.9 per wife; Dr Arlyce Dice can we still wait on the Abd. U/S or do we need to speak with Dr Oneta Rack about ordering tests? Thanks.

## 2010-05-02 NOTE — Telephone Encounter (Signed)
Ok to wait

## 2010-05-14 ENCOUNTER — Encounter: Payer: Self-pay | Admitting: Internal Medicine

## 2010-05-14 ENCOUNTER — Ambulatory Visit (INDEPENDENT_AMBULATORY_CARE_PROVIDER_SITE_OTHER): Payer: Managed Care, Other (non HMO) | Admitting: Internal Medicine

## 2010-05-14 VITALS — BP 118/69 | HR 103 | Temp 98.7°F | Ht 67.0 in | Wt 173.0 lb

## 2010-05-14 DIAGNOSIS — M009 Pyogenic arthritis, unspecified: Secondary | ICD-10-CM

## 2010-05-14 LAB — CBC
HCT: 31.8 % — ABNORMAL LOW (ref 39.0–52.0)
Hemoglobin: 10.6 g/dL — ABNORMAL LOW (ref 13.0–17.0)
MCH: 28.9 pg (ref 26.0–34.0)
MCHC: 33.3 g/dL (ref 30.0–36.0)
RDW: 15.8 % — ABNORMAL HIGH (ref 11.5–15.5)

## 2010-05-14 LAB — BASIC METABOLIC PANEL
CO2: 24 mEq/L (ref 19–32)
Chloride: 106 mEq/L (ref 96–112)
Glucose, Bld: 162 mg/dL — ABNORMAL HIGH (ref 70–99)
Sodium: 140 mEq/L (ref 135–145)

## 2010-05-14 NOTE — Progress Notes (Signed)
  Subjective:    Patient ID: Gerald [name], male    DOB: 06-01-1945, 65 y.o.   MRN: [id number]  HPI Gerald [name] is in for his routine f/u visit. He has now been on antibiotics for a little over 3 months for his severe left hip, thigh and psoas infection.He is tolerating his Flagyl well. His drains were removed on April 23. He has not had any drainage from the drain sites.His pain is improving and he has not needed any narcotic pain medication. He has not had any fever, chills or sweats. His appetite is better.    Review of Systems     Objective:   Physical Exam  Constitutional: He appears well-developed and well-nourished. No distress.       He is in a wheelchair.  Musculoskeletal:       He can stand without much difficulty. His left hip incisions and former drain sites have healed nicely. He has no swelling or tenderness with palpation.  Psychiatric: He has a normal mood and affect.          Assessment & Plan:

## 2010-05-14 NOTE — Assessment & Plan Note (Addendum)
There is a good chance that his infection is cured. I will repeat his sed rate (it was 84 when the abscess was dx 02/06/10) and then discuss timing of stopping antibiotics with him tomorrow.  Addendum: His sed rate was still elevated at 44 and his CRP was at the upper limit of normal at 0.6. I will repeat a contrasted CT scan of the pelvis to see if the abscess has resolved.

## 2010-05-15 ENCOUNTER — Telehealth: Payer: Self-pay | Admitting: *Deleted

## 2010-05-15 NOTE — Progress Notes (Signed)
Addended by: Cliffton Asters on: 05/15/2010 12:08 PM   Modules accepted: Orders

## 2010-05-15 NOTE — Telephone Encounter (Signed)
Patients wife called wanting to share that her husband has a low appetite and complains often of constipation.  Told pt. I will let Dr. Orvan Falconer know and that she should also discuss this with his primary care.  She said she will do this as well. Wendall Mola CMA

## 2010-05-21 ENCOUNTER — Telehealth: Payer: Self-pay | Admitting: *Deleted

## 2010-05-21 NOTE — Telephone Encounter (Signed)
Patient scheduled for CT of the pelvis with contrast for Thursday 05/23/10 at 10:00 AM at [facility].  Gerald [name]. [place]  Patient notified. Wendall Mola CMA

## 2010-05-24 ENCOUNTER — Telehealth: Payer: Self-pay | Admitting: *Deleted

## 2010-05-24 DIAGNOSIS — M009 Pyogenic arthritis, unspecified: Secondary | ICD-10-CM

## 2010-05-24 NOTE — Discharge Summary (Signed)
NAME:  Gerald [name], Gerald [name]                        ACCOUNT NO.:  [id number]   MEDICAL RECORD NO.:  [id number]                   PATIENT TYPE:  INP   LOCATION:  5001                                 FACILITY:  [facility]   PHYSICIAN:  Mark C. Ophelia [name], M.D.                 DATE OF BIRTH:  1945/11/20   DATE OF ADMISSION:  11/07/2002  DATE OF DISCHARGE:  11/11/2002                                 DISCHARGE SUMMARY   DISCHARGE DIAGNOSES:  1. 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  2. Hypertension, not otherwise specified.  3. Type 2 diabetes mellitus.  4. Hypercholesterolemia.  5. Esophageal reflux.  6. Long-term use of anticoagulants.   HISTORY OF PRESENT ILLNESS:  This 65 year old black male with a history of  right hip osteoarthritis and chronic pain, presented to our office for a  preoperative evaluation for a right total hip arthroplasty.  He had  progressively worsening pain with failure to respond to conservative  treatment.  His preoperative x-rays showed bone on bone changes of the right  hip with marginal osteophyte formation and subchondral cyst formation.   PRE-ADMISSION LABORATORY DATA:  WBC 6.6, hemoglobin 12.4, hematocrit 36.7,  platelets 181.  PT 12.8, INR 0.9, PTT 25.  Sodium 140, potassium 3.9,  chloride 107, CO2 of 28, glucose 102, BUN 19, creatinine 1.0.  Calcium 9.6,  total bilirubin 7.2, albumin 3.7, AST 15, ALT 13, alkaline phosphatase 78,  total bilirubin 0.4.  Urinalysis negative.   HOSPITAL COURSE:  On November 07, 2002, the patient was taken to the [facility] Operating Room and a right total hip arthroplasty  procedure was performed.  The surgeon was [employer]. Ophelia [name], M.D. and the  assistant was Dimple Casey, P.A.-C.  The anesthesia was general with local  Marcaine used postoperatively.  The estimated blood loss was less than 300  mL.  There were no surgical or anesthesia complications.  The patient  was  transferred to the recovery room in stable condition.  On November 08, 2002,  the patient was started on pharmacy protocol Coumadin.  The INR was 1.3,  glucose 186, hemoglobin 10.5.  The postoperative x-rays showed the limb lengths were equal, with good  position of the acetabular components.  There was a nondisplaced fracture  noted in the greater trochanter.  Touch down weightbearing x3 weeks ordered.  PT evaluated the patient.  On November 09, 2002, good pain control.  PT 16.2,  INR 1.5, hemoglobin 9.1.  Sodium 134, glucose 178.  The staples were intact.  Slight serosanguineous drainage noted from the wound.  Started FeSO4 at 325  mg p.o. b.i.d. with meals.  Incentive spirometer ordered.  On November 10, 2002, doing well.  No complaints of cough, chest pain, or shortness of  breath.  The vital signs were stable.  Temperature 100.7 degrees.  PT  15.5,  INR 1.9, hemoglobin 8.4, glucose 128.  CBG 143.  Staples were intact.  Slight serosanguineous drainage noted.  No signs of infection.  A chest x-  ray ordered to rule out atelectasis.  Check urinalysis.  On November 11, 2002, the patient doing well.  The patient's chest x-ray negative.  Urinalysis negative.  PT 20.8, INR 2.3, hemoglobin 8.6.  Lytes stable.  Liver function tests within normal limits.  Albumin 2.4.  The patient  ambulating down the hall with minimal assistance, and has completed stairs.   DISPOSITION:  He is ready for discharge home.  The staples are intact.  Slight serosanguineous drainage on the dressing.  No purulent drainage  noted.  No signs of infection.   DISCHARGE MEDICATIONS:  1. Tylox one to two tab p.o. q.4-6h. p.r.n. pain.  2. Coumadin for deep vein thrombosis prophylaxis x2-4 weeks.  3. FeSO4, 325 mg p.o. b.i.d. with meals.  4. Resume previous home medications.  5. Keflex 500 mg p.o. b.i.d. for infection prophylaxis.   CONDITION ON DISCHARGE:  Good and stable.   DISCHARGE INSTRUCTIONS:  The patient will  continue to work with home health  and with PT and OT to improve ambulation and strengthening.  Home health  R.N. to monitor PT and INR.  Dressing changes p.r.n.   FOLLOWUP:  He will follow up in the office in one week for a recheck.  If  there are any problems or complications, he is to notify us, and we will  schedule an earlier visit.      Dimple Casey, P.A.-C.                    Gerald [name], M.D.    JM/MEDQ  D:  12/13/2002  T:  12/13/2002  Job:  [id number]

## 2010-05-24 NOTE — Op Note (Signed)
NAME:  Gerald [name], Gerald [name] NO.:  [id number]   MEDICAL RECORD NO.:  [id number]          PATIENT TYPE:  AMB   LOCATION:  SDS                          FACILITY:  [facility]   PHYSICIAN:  Myrtie Neither, MD      DATE OF BIRTH:  11/16/1945   DATE OF PROCEDURE:  05/22/2005  DATE OF DISCHARGE:                                 OPERATIVE REPORT   PREOP DIAGNOSIS:  1.  Impingement syndrome, left shoulder.  2.  Rotator cuff tear left shoulder.   POSTOPERATIVE DIAGNOSES:  1.  Impingement syndrome left shoulder.  2.  Incomplete tear rotator cuff, left shoulder, with tendinoplasty   ANESTHESIA:  General.   PROCEDURE:  Arthroscopic acromioplasty, decompression, and synovectomy left  shoulder.   DESCRIPTION OF PROCEDURE:  The patient was taken to the operating room and  after given adequate preop medications and given general anesthesia and  intubated, the left shoulder was prepped with DuraPrep and draped in a  sterile manner place.  The patient was placed in barber chair position.  Posterior incision approach was made and a swisher rod was placed posterior-  to-anterior; and anterior inflow border incision was made.  Lateral incision  was used for placement of the shaver.  Inspection of the joint revealed  tremendously tight subacromial joint space with hypertrophic synovial bursal  sac.  With use of synovial shaver a complete synovectomy was done.  Inspection of the rotator cuff, demonstrated tendopathy along the  anterolateral aspect but no complete through-and-through tear.  Collene Mares was  then used to do an acromioplasty; and release of the coracohumeral ligament  after complete synovectomy and complete debridement, and decompression of  the joint.  Inspection of the rotator cuff with the internal and external  rotation of the shoulder; and taking the shoulder into abduction position,  demonstrates good passage of the rotator cuff without impingement.  After  adequate acromioplasty  and decompression.  Wound closure was then done with  3-0 nylon 14 mL, 1/4% Marcaine with epinephrine was injected.  Compressive  dressing was applied immobilizing sling was applied.  The patient tolerated  the procedure quite well and was brought to the recovery room in stable and  satisfactory condition. The patient is being kept for 23-hour observation  for pain control; and would be discharged in the a.m. with Percocet 2 q.4 h.  p.r.n. for pain, ice packs, and return to the office in 1 week.      Myrtie Neither, MD  Electronically Signed     AC/MEDQ  D:  05/22/2005  T:  05/22/2005  Job:  [id number]

## 2010-05-24 NOTE — Telephone Encounter (Signed)
Wife states he is down to 1 flagyl tab. Wants to know if he should get a refill on this & if we had results from the imaging study done yesterday. Told her results are not up on screen yet. md to review & speak with pt. Per notes the flagyl was ordered #90 with no refills. Sent to md. Told her if more needed, we will call her

## 2010-05-24 NOTE — Op Note (Signed)
NAME:  Gerald [name], Gerald [name] NO.:  [id number]   MEDICAL RECORD NO.:  [id number]                   PATIENT TYPE:  INP   LOCATION:  2871                                 FACILITY:  [facility]   PHYSICIAN:  Mark C. Ophelia Charter, M.D.                 DATE OF BIRTH:  [birth year]   DATE OF PROCEDURE:  11/07/2002  DATE OF DISCHARGE:                                 OPERATIVE REPORT   PREOPERATIVE DIAGNOSIS:  Right hip osteoarthritis.   POSTOPERATIVE DIAGNOSIS:  Right hip osteoarthritis.   OPERATION PERFORMED:  Right total knee arthroplasty.   SURGEON:  Mark C. Ophelia Charter, M.D.   COMPONENTS:  Osteonics noncemented #7 distal slotted stem +0 36 cm ceramic  ball, 36 mm cross-fire poly cup and 54 mm no hole acetabular shell.   ANESTHESIA:  General.   DESCRIPTION OF PROCEDURE:  After induction of general anesthesia,  preoperative Ancef prophylaxis, patient was placed in the lateral position  with standard positioning, prepping and draping.  Posterior approach was  made and after sterile skin marker, Betadine Vi-drape had been applied.  Charnley retractor was placed and a large Steinmann pin was placed on the  lateral acetabulum and a small drill bit in the trochanter parallel and then  measurement made.  The Steinmann in was not secure and was close to the hip  socket, was moved a little but more superior and it was apparent that there  was additional short external rotator present and that the __________ had  been tagged and there was still a piriformis present, also split in the  piriformis tendon.  This was tagged, released and then this allowed exposure  to the lateral aspect of the pelvis.  The hip was dislocated, posterior  capsule was excised, neck was cut one fingerbreadth above the lesser  trochanter.  Sequential reaming and broaching was performed.  Acetabulum was  sequentially reamed up to 54.  Trials were inserted for possible ceramic on  ceramic with no lip liner and a  32 mm ball.  There was restoration of leg  length and there was improved stability with a 36 mm ball and it was elected  to proceed with a #7 stem.  +0 36 mm ball and use a polyethylene liner.  Permanent acetabular shell had been placed with preoperative standing  lateral x-ray used for permanent position, 45 degrees abduction and  orientation toward the corner of the room.  Center screw hole was filled.  Polyethylene was popped into place with the 10 degree liner placed posterior  superior.  Permanent stem was inserted after distal reaming and gold broach.  +0 ball ceramic 36 mm was placed, identical findings of stability.  There  was a 1 mm crack in the calcar which was posterior.  It did not extend more  than 2 mm down and femoral stem was secure.  The hip was reduced.  There was  full extension, no trochanteric impingement. Good restoration of neck  lengths and flexion to 80 or 90 degrees, internal rotation to 80 degrees.  After irrigation with saline solution, piriformis tendon was repaired to  gluteus medius.  Tensor fascia was closed with #1 sutures.  0 Vicryl in the  fascia, 2-0 Vicryl in the subcutaneous tissue, skin staple closure.  Instrument count, needle count was correct.                                               Mark C. Ophelia Charter, M.D.    MCY/MEDQ  D:  11/07/2002  T:  11/07/2002  Job:  [id number]

## 2010-05-28 NOTE — Telephone Encounter (Signed)
Please refill his Flagyl and try to locate the report of his recent CT scan. Please tell him that I will call once I have the report.

## 2010-05-29 ENCOUNTER — Telehealth: Payer: Self-pay | Admitting: Licensed Clinical Social Worker

## 2010-05-29 ENCOUNTER — Other Ambulatory Visit: Payer: Self-pay | Admitting: *Deleted

## 2010-05-29 MED ORDER — METRONIDAZOLE 500 MG PO TABS: 500.0000 mg | ORAL_TABLET | Freq: Three times a day (TID) | ORAL | Status: DC

## 2010-05-29 NOTE — Telephone Encounter (Signed)
Med refilled & pt notified. Copy of report to md

## 2010-05-29 NOTE — Telephone Encounter (Signed)
Patient would like to know if Dr. Orvan Falconer has had a chance to review the CT scan done at [facility] on 05/23/2010. Patient does not want to pick up the flagyl if he doesn't need it.

## 2010-05-30 ENCOUNTER — Telehealth: Payer: Self-pay | Admitting: Internal Medicine

## 2010-05-30 NOTE — Telephone Encounter (Signed)
Mr Gerald [name]'s repeat CT scan showed "destructive changes of the acetabular walls and adjacent ileum and marked heterogeneous soft tissue and likely central fluid" in the left hip. A recent sed rate was 44 and a CRP was 0.6. He has been off of Flagyl for about 1 week. I discussed the situation with Dr. Gean [name] and he will see Gerald [name] in the office and attempt aspiration of the hip. I asked Gerald [name] to call me after he sees Dr. Turner [name].

## 2010-06-12 ENCOUNTER — Encounter: Payer: Self-pay | Admitting: Internal Medicine

## 2010-06-25 ENCOUNTER — Encounter: Payer: Self-pay | Admitting: Internal Medicine

## 2010-06-25 ENCOUNTER — Ambulatory Visit (INDEPENDENT_AMBULATORY_CARE_PROVIDER_SITE_OTHER): Payer: Managed Care, Other (non HMO) | Admitting: Internal Medicine

## 2010-06-25 DIAGNOSIS — M009 Pyogenic arthritis, unspecified: Secondary | ICD-10-CM

## 2010-06-25 NOTE — Assessment & Plan Note (Signed)
I cannot be certain that he has persistent left hip infection but I do agree that the last refill was to continue Flagyl at this time. I warned him about the possibility of side effects such as peripheral neuropathy and asked him to call me if he notes any numbness, tingling or pain in his feet and legs.

## 2010-06-25 NOTE — Progress Notes (Signed)
  Subjective:    Patient ID: KALEB SEK, male    DOB: 22-Jun-1945, 65 y.o.   MRN: [id number]  HPI Mr. Elsayed is in for his routine followup visit. He had a followup CT scan of his hip last month which showed a possible fluid collection. Dr. Turner Daniels attempted hip aspiration but could not obtain any fluid. He was started back on oral Flagyl about 3 weeks ago. He is tolerating it well. Overall he is having less pain in his left hip.    Review of Systems     Objective:   Physical Exam  Constitutional: No distress.  Musculoskeletal:       He has no swelling, redness or significant pain around his left hip.          Assessment & Plan:

## 2010-07-16 ENCOUNTER — Telehealth: Payer: Self-pay | Admitting: Gastroenterology

## 2010-07-16 MED ORDER — HYDROCORTISONE ACETATE 25 MG RE SUPP: 25.0000 mg | Freq: Two times a day (BID) | RECTAL | Status: DC

## 2010-07-16 NOTE — Telephone Encounter (Signed)
Pt has had problems for several months d/t infection in his leg- record were lost during EPIC conversion, but we need to schedule him for an Abd/ [place] and we have reminders to do the test when he is able to travel. Pt is at home now after 2.5 months in the hospital and or rehab. Pt is able to use a walker now. Pt reports he's always had hemorrhoids, but now they seem to be worse and are painful. He reports blood on tissue with wiping prn, but no real bleeding. He reports using Prep H in the past, but this isn't working. Pt is ok with seeing an extender, knows his wife is off 07/26/10, but I don't have the schedule yet. Pt is ok with waiting- he's had the problem for months. Pt is on pain meds, takes Miralax daily and usually has a stool. Dr Arlyce Dice, while waiting for an appt, can we order Anusol [facility]? Thanks.

## 2010-07-16 NOTE — Telephone Encounter (Signed)
yes

## 2010-07-16 NOTE — Telephone Encounter (Signed)
Notified pt we ordered Anusol Supp.for him at [facility]; pt stated understanding.

## 2010-07-29 ENCOUNTER — Telehealth: Payer: Self-pay | Admitting: *Deleted

## 2010-07-29 NOTE — Telephone Encounter (Signed)
Spoke with pt to check on his hemorrhoids. Pt stated they are doing much better with the Anusol cream; almost no pain now. Pt's appt was changed from September to 08/09/10. Pt stated his H&H is holding. He had it checked at his PCP's ofc and it was 10 something. Dr Arlyce Dice needs to discuss abd. U/S he wanted in the past for the pt.

## 2010-08-09 ENCOUNTER — Encounter: Payer: Self-pay | Admitting: Gastroenterology

## 2010-08-09 ENCOUNTER — Ambulatory Visit (INDEPENDENT_AMBULATORY_CARE_PROVIDER_SITE_OTHER): Payer: Managed Care, Other (non HMO) | Admitting: Gastroenterology

## 2010-08-09 VITALS — BP 114/70 | HR 64 | Ht 67.0 in | Wt 178.8 lb

## 2010-08-09 DIAGNOSIS — K602 Anal fissure, unspecified: Secondary | ICD-10-CM

## 2010-08-09 NOTE — Patient Instructions (Signed)
We have called in your prescription to [facility] for you to pick up today Please call back if no better

## 2010-08-09 NOTE — Assessment & Plan Note (Signed)
Plan diltiazem  ointment and warm soaks.  Failing this I would consider Botox injection of his fissure

## 2010-08-09 NOTE — Progress Notes (Signed)
.   Gerald [name] has returned for evaluation of rectal pain. In late January, 2012 he underwent a hip replacement. This was complicated by infection requiring antibiotics and a repeat procedure in March. In the last 4 months she's been complaining of severe rectal pain with a bowel movement. He's used suppositories without much relief. There has been no bleeding. Colonoscopy in January, 2012 demonstrated diverticulosis only.  This was done because of a possible iron deficiency anemia. He subsequently tested Hemoccult negative.  On rectal exam there is a sentinel pile in the posterior midline with a fissure

## 2010-08-27 ENCOUNTER — Ambulatory Visit (INDEPENDENT_AMBULATORY_CARE_PROVIDER_SITE_OTHER): Payer: Managed Care, Other (non HMO) | Admitting: Internal Medicine

## 2010-08-27 ENCOUNTER — Encounter: Payer: Self-pay | Admitting: Internal Medicine

## 2010-08-27 VITALS — BP 142/70 | HR 75 | Temp 98.0°F | Ht 66.5 in | Wt 185.0 lb

## 2010-08-27 DIAGNOSIS — M009 Pyogenic arthritis, unspecified: Secondary | ICD-10-CM

## 2010-08-27 NOTE — Progress Notes (Signed)
  Subjective:    Patient ID: Gerald [name], male    DOB: [birth year], 65 y.o.   MRN: [id number]  HPI Gerald [name] is in for his routine visit. He is having only minimal, intermittent left hip pain. He continues to walk with the aid of a walker but is feeling a little more confident with his ability to ambulate. He is now been on 2 prolonged courses of antibiotics for his polymicrobial cleft hip infection. He completed 3-1/2 months of antibiotic therapy in mid May. He is now completing his seventh week of oral Flagyl. He has had no problems tolerating the Flagyl.    Review of Systems     Objective:   Physical Exam  Constitutional: No distress.  Musculoskeletal:       He has no swelling, redness or significant pain around his left hip.          Assessment & Plan:

## 2010-08-27 NOTE — Assessment & Plan Note (Signed)
I hope and suspect that his left hip infection is now cured. I will repeat his sedimentation rate and C-reactive protein which were both coming down 3 months ago. I will have him go ahead and stop the Flagyl today.

## 2010-08-28 ENCOUNTER — Telehealth: Payer: Self-pay | Admitting: *Deleted

## 2010-08-28 NOTE — Telephone Encounter (Signed)
I spoke with Dr. Turner Daniels today and informed him that Gerald [name] sed rate has now normalized. I also indicated that I was concerned about long-term therapy with Flagyl do to the possibility of side effects such as irreversible peripheral neuropathy. He agrees with observation off of antibiotics now. I called Gerald [name] and advised him of this conversation and told him to stop taking the Flagyl as directed yesterday.

## 2010-08-28 NOTE — Telephone Encounter (Signed)
Patients wife called advised that after their visit yesterday a nurse from Dr Amil Amen office called and advised them to keep taking the Flagyl. Our provider told them to discontinue the medication and due to this they are confused. Advised her that in the note it does state to discontinue the medication. However I will ask the provider just to be sure as he may have either spoken to Dr Assunta Gambles or given him the note either way I will call her back after I speak with the provider.

## 2010-09-03 ENCOUNTER — Other Ambulatory Visit: Payer: Self-pay | Admitting: Gastroenterology

## 2010-09-13 ENCOUNTER — Ambulatory Visit: Payer: Managed Care, Other (non HMO) | Admitting: Gastroenterology

## 2010-10-14 ENCOUNTER — Ambulatory Visit (INDEPENDENT_AMBULATORY_CARE_PROVIDER_SITE_OTHER): Payer: Medicare Other | Admitting: Internal Medicine

## 2010-10-14 ENCOUNTER — Encounter: Payer: Self-pay | Admitting: Internal Medicine

## 2010-10-14 VITALS — BP 116/68 | HR 71 | Temp 98.0°F | Ht 67.0 in | Wt 188.0 lb

## 2010-10-14 DIAGNOSIS — Z23 Encounter for immunization: Secondary | ICD-10-CM

## 2010-10-14 DIAGNOSIS — M009 Pyogenic arthritis, unspecified: Secondary | ICD-10-CM

## 2010-10-14 DIAGNOSIS — Z0000 Encounter for general adult medical examination without abnormal findings: Secondary | ICD-10-CM

## 2010-10-14 NOTE — Progress Notes (Signed)
  Subjective:    Patient ID: Gerald [name], male    DOB: 1945/12/07, 65 y.o.   MRN: [id number]   HPI [name] is in for his routine visit. He is now been off of all antibiotics for about 6 weeks. 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 He states that his wife has been a little concerned thinking that he has left thigh was a little larger than his right thigh but he does not see any difference. He has been more mobile recently but unfortunately has had 2 falls in the last 3 weeks. He states that they both occurred when he was trying to do too much. His last fall was 3 days ago when he was watering his lawn and his crutches slipped and he fell hitting his left knee. He's had a little bit more discomfort in his left knee and left hip since that time. He has not had any fever, chills, or sweats.    Review of Systems     Objective:   Physical Exam  Constitutional: No distress.  Musculoskeletal:       His left hip incisions are well-healed. I do not notice any unusual new swelling or other signs of inflammation in his hip.          Assessment & Plan:

## 2010-10-14 NOTE — Assessment & Plan Note (Signed)
At the time of his last visit on August 21 his sed rate had normalized to 9 and his C-reactive protein had normalized to 0.2. I am hopeful that his left hip infection is now healed. I will recheck his lab work today and continue observation off of all antibiotics.  He received his influenza vaccine today.

## 2010-10-15 NOTE — Progress Notes (Signed)
Addended by: Lurlean Leyden on: 10/15/2010 09:53 AM   Modules accepted: Orders

## 2010-12-11 ENCOUNTER — Ambulatory Visit: Payer: Medicare Other | Admitting: Infectious Disease

## 2010-12-12 ENCOUNTER — Other Ambulatory Visit: Payer: Self-pay | Admitting: Internal Medicine

## 2010-12-12 ENCOUNTER — Encounter: Payer: Self-pay | Admitting: Internal Medicine

## 2010-12-12 ENCOUNTER — Ambulatory Visit (INDEPENDENT_AMBULATORY_CARE_PROVIDER_SITE_OTHER): Payer: Medicare Other | Admitting: Internal Medicine

## 2010-12-12 DIAGNOSIS — B356 Tinea cruris: Secondary | ICD-10-CM

## 2010-12-12 DIAGNOSIS — M009 Pyogenic arthritis, unspecified: Secondary | ICD-10-CM

## 2010-12-12 LAB — SEDIMENTATION RATE: Sed Rate: 12 mm/hr (ref 0–16)

## 2010-12-12 LAB — C-REACTIVE PROTEIN: CRP: 0.52 mg/dL (ref <0.60)

## 2010-12-12 MED ORDER — FLUCONAZOLE 100 MG PO TABS: 100.0000 mg | ORAL_TABLET | Freq: Every day | ORAL | Status: DC

## 2010-12-12 MED ORDER — FLUCONAZOLE 100 MG PO TABS: 100.0000 mg | ORAL_TABLET | Freq: Every day | ORAL | Status: AC

## 2010-12-12 NOTE — Assessment & Plan Note (Signed)
He appears to have developed tinea cruris. I will treat him with a short course of oral fluconazole.

## 2010-12-12 NOTE — Progress Notes (Addendum)
  Subjective:    Patient ID: Gerald [name], male    DOB: July 12, 1945, 65 y.o.   MRN: [id number]  HPI Mr. Gerald [name] is in for his routine followup visit. He is now been off all antibiotics for his left hip infection for a little over 3 months. Overall he is feeling better. He is putting more weight on his left leg and is having a little less pain and discomfort. 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 He has not had any fever.  He has been bothered by some scrotal tenderness and redness and notes that his groin seems to weep and lose. It is mildly pruritic.    Review of Systems     Objective:   Physical Exam  Constitutional: He appears well-nourished. No distress.  HENT:  Mouth/Throat: Oropharynx is clear and moist. No oropharyngeal exudate.  Cardiovascular: Normal rate, regular rhythm and normal heart sounds.   No murmur heard. Pulmonary/Chest: Breath sounds normal. He has no wheezes. He has no rales.  Genitourinary:       The skin on the scrotum and in the perineum is red and moist with very faint peripheral scale.  Musculoskeletal:       His left hip incisions are all completely healed without any evidence of inflammation. He has no swelling or tenderness of his hip or left thigh.  Psychiatric: He has a normal mood and affect.   Lab Results  Component Value Date   ESRSEDRATE 12 12/12/2010    Lab Results  Component Value Date   CRP 0.52 12/12/2010          Assessment & Plan:

## 2010-12-12 NOTE — Assessment & Plan Note (Addendum)
He is improving 3 months after stopping antibiotics and his sed rate and CRP are normal. 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 I will continue observation off of antibiotics.

## 2010-12-13 ENCOUNTER — Encounter: Payer: Self-pay | Admitting: Internal Medicine

## 2010-12-14 ENCOUNTER — Encounter: Payer: Self-pay | Admitting: Internal Medicine

## 2010-12-14 DIAGNOSIS — M009 Pyogenic arthritis, unspecified: Secondary | ICD-10-CM

## 2010-12-14 NOTE — Assessment & Plan Note (Signed)
Lab Results  Component Value Date   CRP 0.52 12/12/2010    Lab Results  Component Value Date   ESRSEDRATE 12 12/12/2010    I suspect his infection is cured. I will continue observation off antibiotics.

## 2011-01-03 ENCOUNTER — Telehealth: Payer: Self-pay | Admitting: Gastroenterology

## 2011-01-03 NOTE — Telephone Encounter (Signed)
Nitroglycerin ointment 0.4% applied topically 3 times a day. This is best used for 8 weeks. Biggest side effect is headache. Do not use with phosphodiesterase inhibitors such as Viagra Needs to continue with sitz bath and use stool softeners if he is constipated or having hard stool Follow with Dr. Arlyce Dice or call us if worsening or no better

## 2011-01-03 NOTE — Telephone Encounter (Signed)
If dilt gel is not working, topical nitroglycerin could be helpful. This often takes weeks to get effective healing. I do not routinely perform botox injection for fissure.  Could ask other providers if they could do this in Kaplan's absence\

## 2011-01-03 NOTE — Telephone Encounter (Signed)
Patient would like to try topical nitroglycerin to see if this will help. Please, advise on rx.

## 2011-01-03 NOTE — Telephone Encounter (Signed)
Patient calling to report the Diltiazem ointment and warm soaks have not healed the anal fissure. He states when he saw Dr. Arlyce Dice on 08/09/10, he mentioned Botox injections for this and he is interested in having this done. He is asking if other physicians in the group would do this during Dr. Marzetta Board absence. Hx anal fissure Last OV 08/09/10. Please, advise.

## 2011-01-03 NOTE — Telephone Encounter (Signed)
[facility] with Rx and they will have it made by Monday. Spoke with patient and gave him Dr. Lauro Franklin recommendations and to NOT take phosphodiesterase inhibitors such as Viagra.

## 2011-05-14 ENCOUNTER — Telehealth: Payer: Self-pay | Admitting: Gastroenterology

## 2011-05-14 NOTE — Telephone Encounter (Signed)
Pt states he has been having rectal pain and problems with hemorrhoids. States preparation h is not helping and that the only way he is getting by is by taking pain meds left over from dental procedure. Pt scheduled to see Willette Cluster NP tomorrow at 10:30am. Pt aware of appt date and time.

## 2011-05-15 ENCOUNTER — Encounter: Payer: Self-pay | Admitting: Nurse Practitioner

## 2011-05-15 ENCOUNTER — Ambulatory Visit (INDEPENDENT_AMBULATORY_CARE_PROVIDER_SITE_OTHER): Payer: Medicare Other | Admitting: Nurse Practitioner

## 2011-05-15 VITALS — BP 124/72 | HR 82 | Ht 67.0 in | Wt 197.2 lb

## 2011-05-15 DIAGNOSIS — K648 Other hemorrhoids: Secondary | ICD-10-CM

## 2011-05-15 MED ORDER — POLYETHYLENE GLYCOL 3350 17 GM/SCOOP PO POWD: 17.0000 g | Freq: Every day | ORAL | Status: AC

## 2011-05-15 MED ORDER — HYDROCORTISONE ACETATE 25 MG RE SUPP: RECTAL | Status: DC

## 2011-05-15 NOTE — Progress Notes (Signed)
Gerald [name] [id number] [birth year]   HISTORY OR PRESENT ILLNESS : Patient is a 66 year old male known to Dr. Arlyce Dice for history of diverticulosis and hemorrhoids. He had a colonoscopy for evaluation or deficiency anemia in January 2012. Findings included only diverticulosis.  Patient is in today for evaluation of hemorrhoids. He has periodic flares of his hemorrhoids but describes this as being the most painful. No bleeding, just rectal pain. He is using OTC preparation H. He has been constipated and taking Mineral oil, [place] MOM.  Current Medications, Allergies, Past Medical History, Past Surgical History, Family History and Social History were reviewed in [employer] record.   PHYSICAL EXAMINATION : General:  Well developed  black male in no acute distress Head: Normocephalic and atraumatic Eyes:  sclerae anicteric,conjunctive pink. Ears: Normal auditory acuity Neck: Supple, no masses.  Lungs: Clear throughout to auscultation Heart: Regular rate and rhythm; no murmurs heard Abdomen: Soft, nondistended, nontender. No masses or hepatomegaly noted. Normal bowel sounds Rectal: Prolapsed internal hemorrhoid, non-reducible but not thrombosed. Musculoskeletal: Symmetrical with no gross deformities  Skin: No lesions on visible extremities Extremities: No edema or deformities noted Neurological: Oriented , grossly nonfocal Cervical Nodes:  No significant cervical adenopathy Psychological:  Alert and cooperative. Normal mood and affect  ASSESSMENT AND PLAN :  1. Hemorrhoids. Begin Anusol HC suppositories bid for 10 days. We need to correct constipation. See #2.   2. Constipation, begin daily MiraLax. If not having adequate bowel movements in 5-7 days may increase to twice daily.

## 2011-05-15 NOTE — Patient Instructions (Signed)
High fiber diet. Anusol  Suppositories sent to [facility], [address]. We have given you samples of Miralax to use once daily  .  After 1 week you if you have no results, take Miralax twice daily.

## 2011-05-19 ENCOUNTER — Encounter: Payer: Self-pay | Admitting: Nurse Practitioner

## 2011-05-20 ENCOUNTER — Encounter: Payer: Self-pay | Admitting: Nurse Practitioner

## 2011-05-20 NOTE — Progress Notes (Signed)
I agree with assessment and plan.

## 2011-08-16 IMAGING — CT CT ABD-PELV W/ CM
1 of 2 series · 15 of 32 positions shown, 19 images · IV contrast (agent unspecified)
Comparison: None.

CT CHEST

CLINICAL DATA: Chest and abdominal pain.  Hepatitis.  Anemia.

CT CHEST, ABDOMEN AND PELVIS WITH CONTRAST
TECHNIQUE: Multidetector CT imaging of the chest, abdomen and
pelvis was performed following the standard protocol during bolus
administration of intravenous contrast.
Contrast: 100 ml Nmnipaque-N66 and oral contrast

[Series 2: cap with st · axial · 0.68mm/px · z∈[-360,+216]mm · 15 of 127 slices shown, 19 images]
[im 6/127  mediastinal]
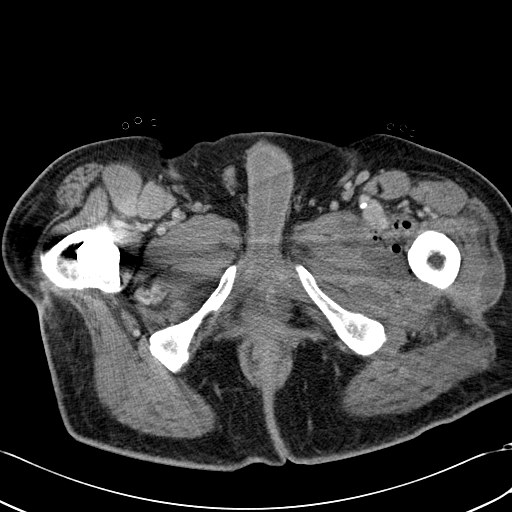
[im 6/127  lung]
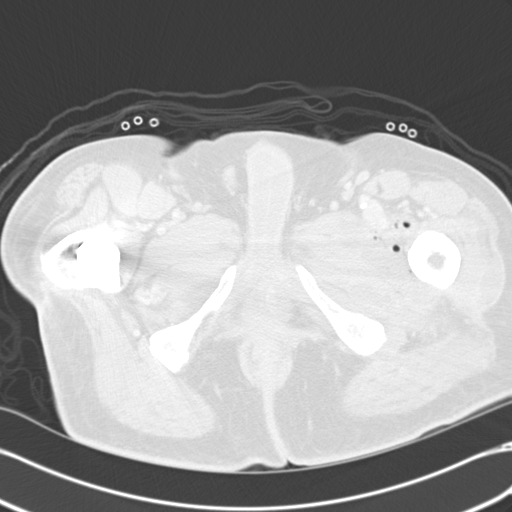
[im 18/127  lung]
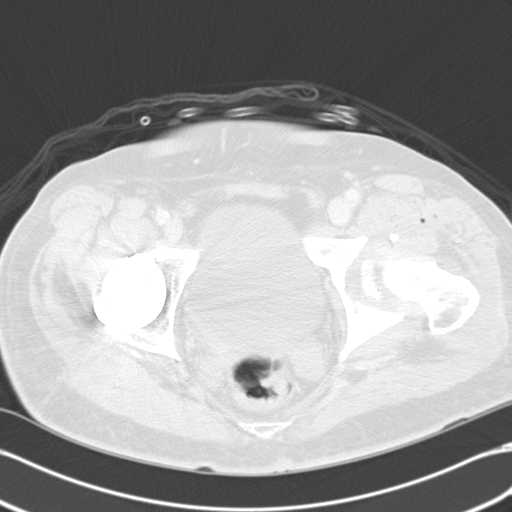
[im 29/127  lung]
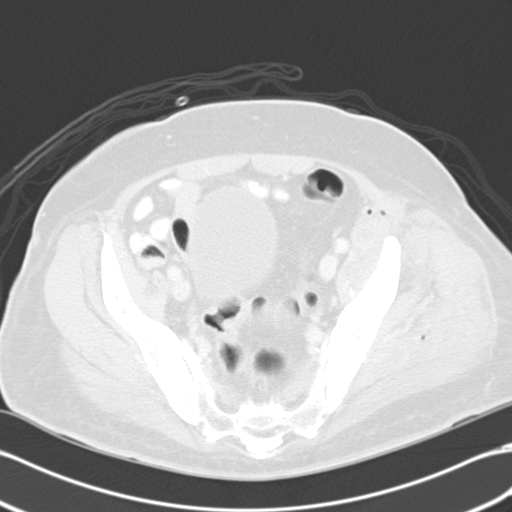
[im 32/127  lung]
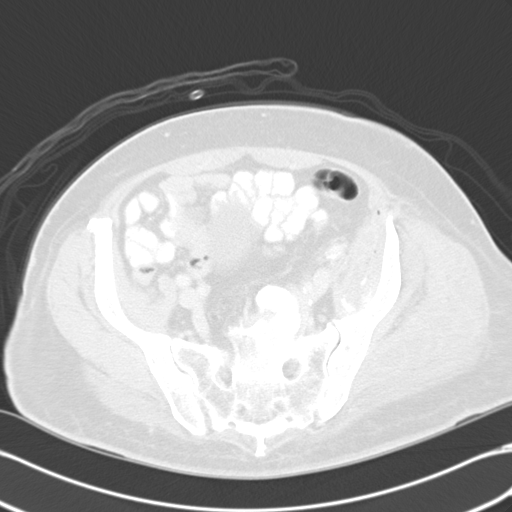
[im 41/127  mediastinal]
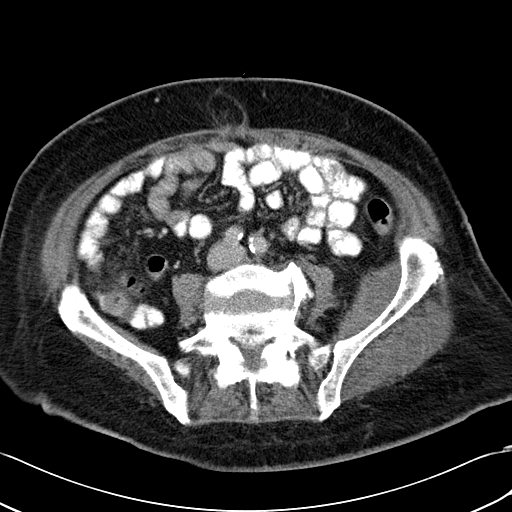
[im 41/127  lung]
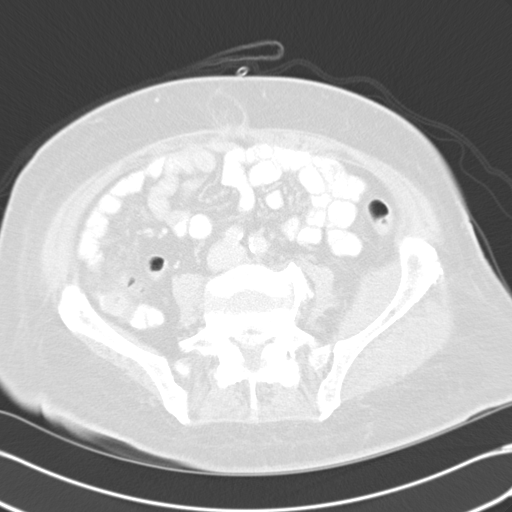
[im 46/127  lung]
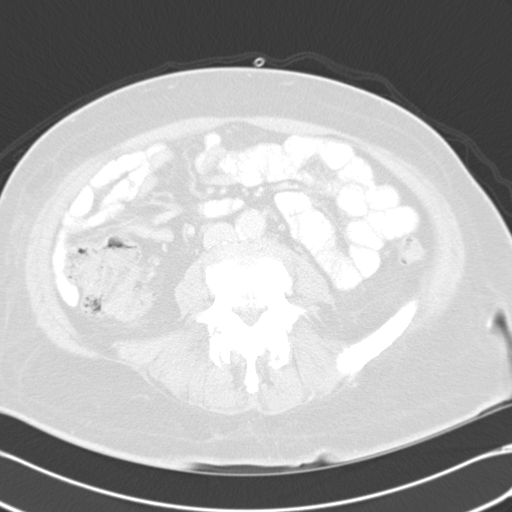
[im 58/127  lung]
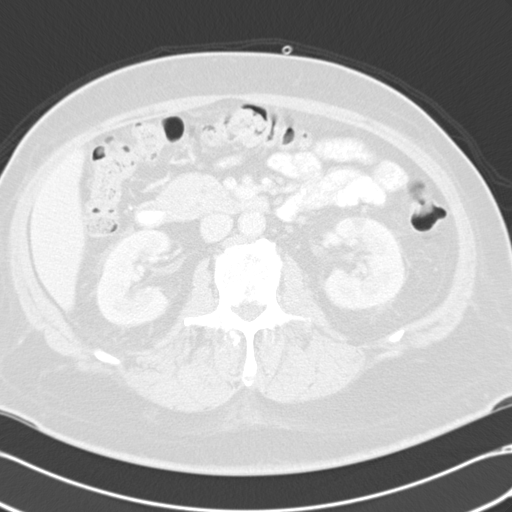
[im 64/127  lung]
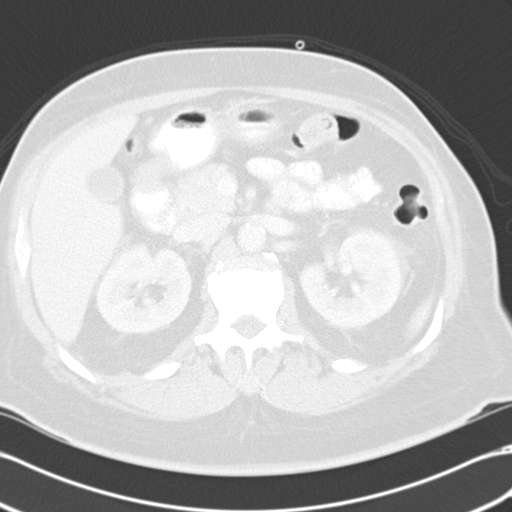
[im 69/127  mediastinal]
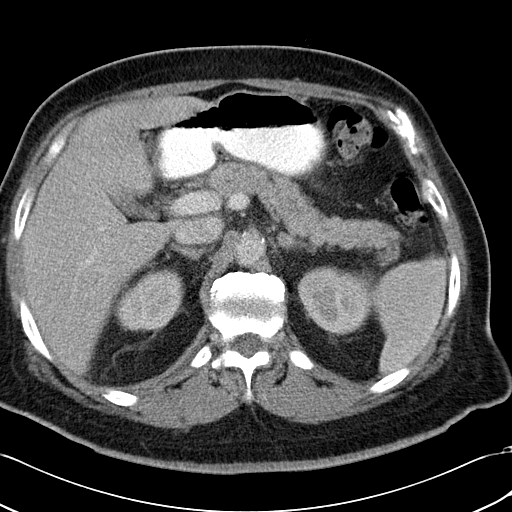
[im 69/127  lung]
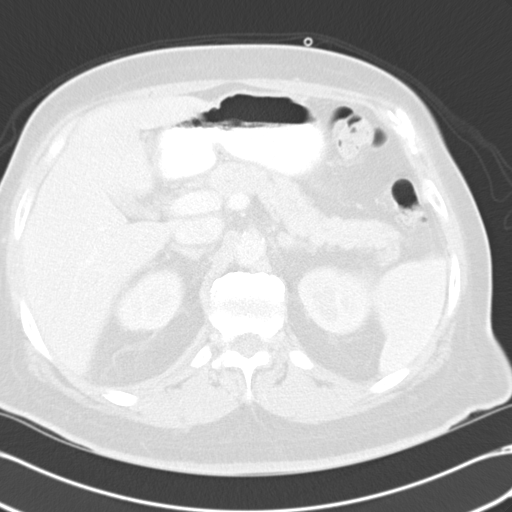
[im 81/127  lung]
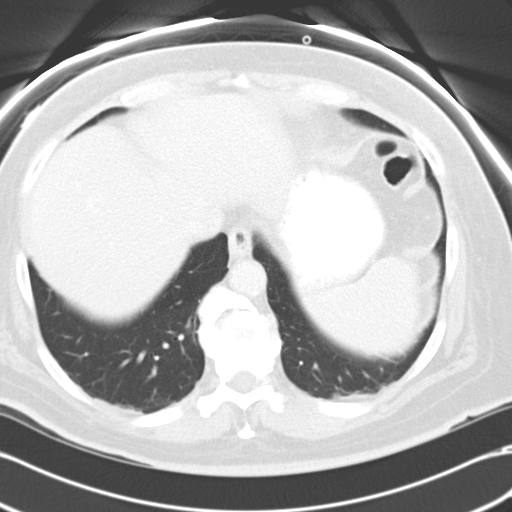
[im 86/127  lung]
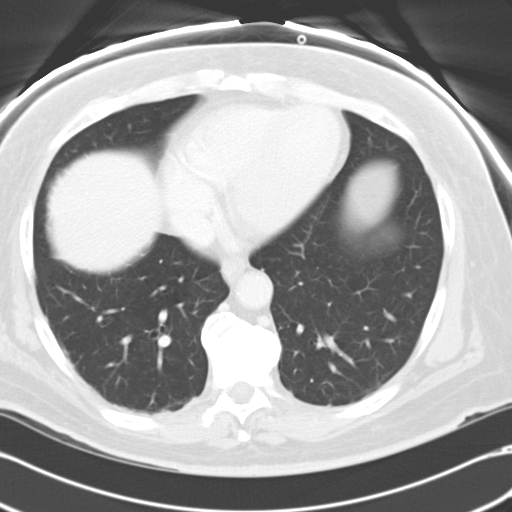
[im 95/127  lung]
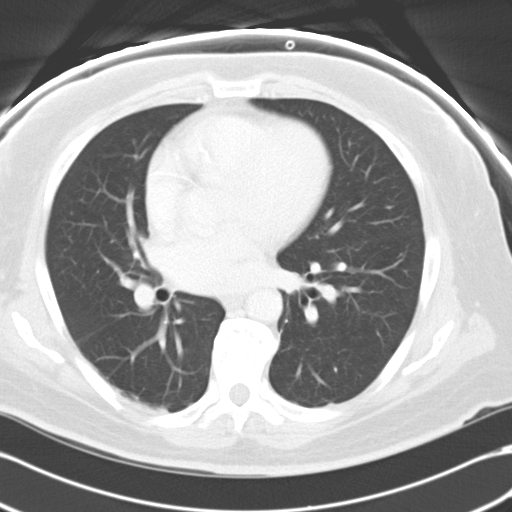
[im 104/127  mediastinal]
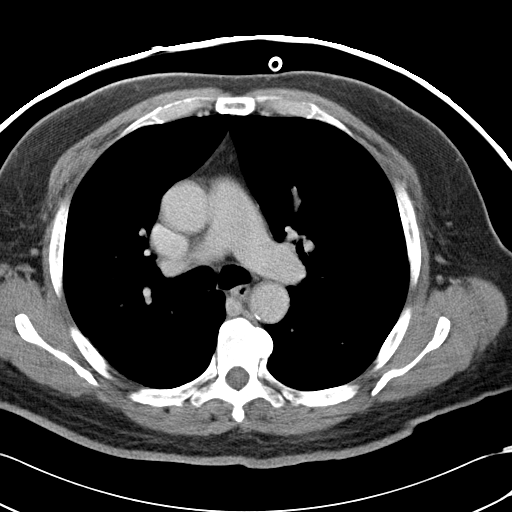
[im 104/127  lung]
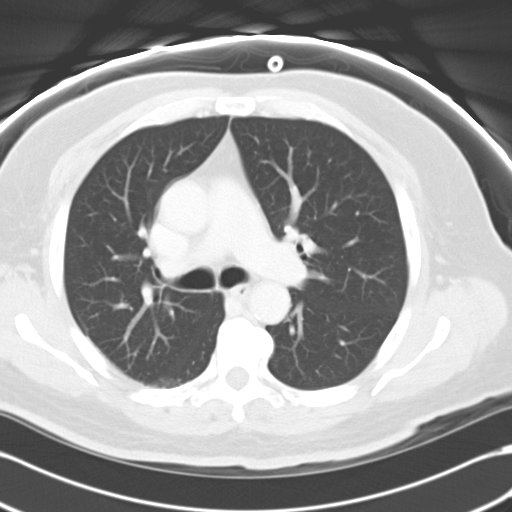
[im 109/127  lung]
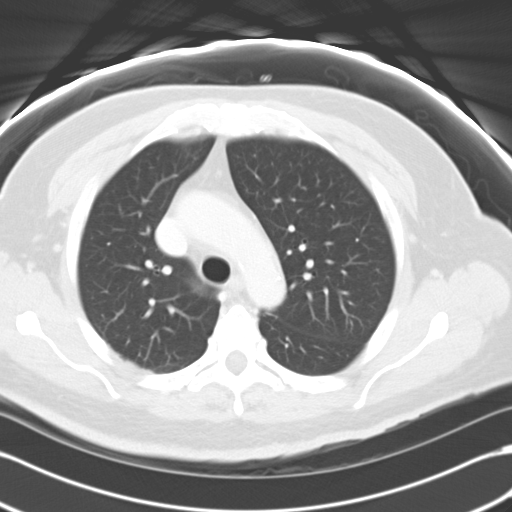
[im 121/127  lung]
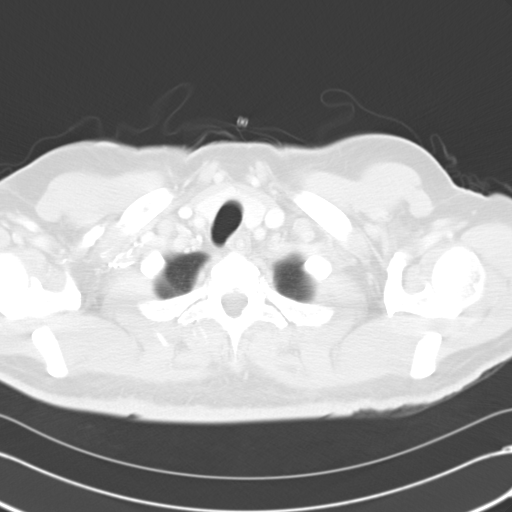

[15 of 32 positions shown; findings below may reference images not displayed]

FINDINGS: No evidence of mediastinal or hilar masses.  No
adenopathy seen elsewhere within the thorax.  No evidence of
pleural or pericardial effusion.

Mild posterior pleural - parenchymal scarring noted bilaterally.
No evidence of pulmonary infiltrate or mass.  Central
tracheobronchial airways are patent.
IMPRESSION: No active disease within the thorax.

CT ABDOMEN AND PELVIS
FINDINGS: The liver, gallbladder, pancreas, spleen, and adrenal
glands are normal in appearance.  Tiny renal cysts are noted
bilaterally but there is no evidence of renal mass or
hydronephrosis.

No soft tissue masses or lymphadenopathy identified within the
abdomen or pelvis.  Fluid and gas collection is seen extending
throughout the course of the left iliopsoas muscle in the left
pelvis and left thigh, which is suspicious for abscess.  End-stage
arthropathy of the left hip is noted.  Incidental note is made  of
a small periumbilical hernia containing only fat.  No evidence of
herniated bowel.
IMPRESSION: 1.  Fluid and gas collection throughout the left iliopsoas muscle
in the left pelvis and proximal thigh, suspicious for abscess.
2.  No other acute findings within the abdomen or pelvis.
3.  End-stage left hip arthropathy.
4.  Small periumbilical hernia containing only fat.

## 2011-09-17 ENCOUNTER — Other Ambulatory Visit: Payer: Self-pay | Admitting: Internal Medicine

## 2011-09-17 DIAGNOSIS — R413 Other amnesia: Secondary | ICD-10-CM

## 2011-09-19 ENCOUNTER — Ambulatory Visit
Admission: RE | Admit: 2011-09-19 | Discharge: 2011-09-19 | Disposition: A | Discharge status: Home / Self Care | Payer: Medicare Other | Source: Ambulatory Visit | Attending: Internal Medicine | Admitting: Internal Medicine

## 2011-09-19 ENCOUNTER — Other Ambulatory Visit: Payer: Medicare Other

## 2011-09-19 DIAGNOSIS — R413 Other amnesia: Secondary | ICD-10-CM

## 2011-12-24 DIAGNOSIS — R413 Other amnesia: Secondary | ICD-10-CM

## 2012-01-20 ENCOUNTER — Emergency Department (HOSPITAL_COMMUNITY)
Admission: EM | Admit: 2012-01-20 | Discharge: 2012-01-20 | Disposition: A | Discharge status: Home / Self Care | Payer: Medicare Other | Attending: Emergency Medicine | Admitting: Emergency Medicine

## 2012-01-20 ENCOUNTER — Encounter (HOSPITAL_COMMUNITY): Payer: Self-pay | Admitting: *Deleted

## 2012-01-20 ENCOUNTER — Emergency Department (HOSPITAL_COMMUNITY): Payer: Medicare Other

## 2012-01-20 DIAGNOSIS — G8929 Other chronic pain: Secondary | ICD-10-CM | POA: Insufficient documentation

## 2012-01-20 DIAGNOSIS — Z87891 Personal history of nicotine dependence: Secondary | ICD-10-CM | POA: Insufficient documentation

## 2012-01-20 DIAGNOSIS — F329 Major depressive disorder, single episode, unspecified: Secondary | ICD-10-CM | POA: Insufficient documentation

## 2012-01-20 DIAGNOSIS — R51 Headache: Secondary | ICD-10-CM

## 2012-01-20 DIAGNOSIS — Z7982 Long term (current) use of aspirin: Secondary | ICD-10-CM | POA: Insufficient documentation

## 2012-01-20 DIAGNOSIS — I10 Essential (primary) hypertension: Secondary | ICD-10-CM | POA: Insufficient documentation

## 2012-01-20 DIAGNOSIS — Z9889 Other specified postprocedural states: Secondary | ICD-10-CM | POA: Insufficient documentation

## 2012-01-20 DIAGNOSIS — E119 Type 2 diabetes mellitus without complications: Secondary | ICD-10-CM | POA: Insufficient documentation

## 2012-01-20 DIAGNOSIS — F3289 Other specified depressive episodes: Secondary | ICD-10-CM | POA: Insufficient documentation

## 2012-01-20 DIAGNOSIS — Z79899 Other long term (current) drug therapy: Secondary | ICD-10-CM | POA: Insufficient documentation

## 2012-01-20 DIAGNOSIS — M25559 Pain in unspecified hip: Secondary | ICD-10-CM | POA: Insufficient documentation

## 2012-01-20 DIAGNOSIS — H53149 Visual discomfort, unspecified: Secondary | ICD-10-CM | POA: Insufficient documentation

## 2012-01-20 HISTORY — DX: Type 2 diabetes mellitus without complications: E11.9

## 2012-01-20 LAB — BASIC METABOLIC PANEL
BUN: 25 mg/dL — ABNORMAL HIGH (ref 6–23)
CO2: 26 mEq/L (ref 19–32)
Chloride: 103 mEq/L (ref 96–112)
Creatinine, Ser: 1.25 mg/dL (ref 0.50–1.35)
Glucose, Bld: 98 mg/dL (ref 70–99)
Potassium: 3.9 mEq/L (ref 3.5–5.1)

## 2012-01-20 LAB — CBC WITH DIFFERENTIAL/PLATELET
Eosinophils Absolute: 0.2 10*3/uL (ref 0.0–0.7)
Eosinophils Relative: 3 % (ref 0–5)
HCT: 34.4 % — ABNORMAL LOW (ref 39.0–52.0)
Hemoglobin: 11.7 g/dL — ABNORMAL LOW (ref 13.0–17.0)
Lymphocytes Relative: 30 % (ref 12–46)
Lymphs Abs: 1.9 10*3/uL (ref 0.7–4.0)
MCH: 28.5 pg (ref 26.0–34.0)
MCV: 83.9 fL (ref 78.0–100.0)
Monocytes Absolute: 0.6 10*3/uL (ref 0.1–1.0)
Monocytes Relative: 10 % (ref 3–12)
Platelets: 147 10*3/uL — ABNORMAL LOW (ref 150–400)
RBC: 4.1 MIL/uL — ABNORMAL LOW (ref 4.22–5.81)
WBC: 6.2 10*3/uL (ref 4.0–10.5)

## 2012-01-20 MED ORDER — ACETAMINOPHEN 325 MG PO TABS
650.0000 mg | ORAL_TABLET | Freq: Once | ORAL | Status: AC
  Administered 2012-01-20: 650 mg via ORAL
  Filled 2012-01-20: qty 2

## 2012-01-20 NOTE — ED Notes (Signed)
Pt states that he fell down flight of stairs 1 week ago in [place].  States he was dx with subarachnoid hemorrhage.  Was admitted to ICU.  Saw his PCP here last Friday.  Tonight, c/o photophobia, HA.  No slurred speech, no facial droop, grip strength equal, no arm drift.

## 2012-01-20 NOTE — ED Notes (Signed)
Patient transported to CT 

## 2012-01-20 NOTE — ED Provider Notes (Signed)
History     CSN: [id number]  Arrival date & time 01/20/12  0055   First MD Initiated Contact with Patient 01/20/12 0211      Chief Complaint  Patient presents with  . Fall    (Consider location/radiation/quality/duration/timing/severity/associated sxs/prior treatment) HPI Hx per PT and wife, fell about 10 days and was admitted in [facility] for ICH.  He was admitted overnight and drove back home. Doing better at home until yesterday.  He has stayed in bed all day and then tonight developed sharp severe R sided HA with photophobia. He stopped taking ASA after his fall and denies any other blood thinners.  PTA took tylenol and now is feeling much better, no neck stiffness, no fevers, no rash.  His sleep pattern has been off this last week.  He denies any new weakness or numbness. No slurred speech, he has a chronic hip issue and uses crutches/ walker at home. Gait unchanged.  No F/C or recent illness otherwise. No visual changes and symptoms now resolved.  Past Medical History  Diagnosis Date  . Depression   . Hypertension   . Diabetes mellitus without complication     controlled by diet    Past Surgical History  Procedure Date  . Hip surgery     Family History  Problem Relation Age of Onset  . Dementia Mother   . Heart attack Maternal Grandmother   . Diabetes Maternal Grandfather     History  Substance Use Topics  . Smoking status: Former Smoker -- 0.5 packs/day for 10 years  . Smokeless tobacco: Never Used  . Alcohol Use: 0.5 oz/week    1 drink(s) per week      Review of Systems  Constitutional: Negative for fever and chills.  HENT: Negative for neck pain and neck stiffness.   Eyes: Negative for pain.  Respiratory: Negative for shortness of breath.   Cardiovascular: Negative for chest pain.  Gastrointestinal: Negative for abdominal pain.  Genitourinary: Negative for dysuria.  Musculoskeletal: Negative for back pain.  Skin: Negative for rash.  Neurological: Positive  for headaches.  All other systems reviewed and are negative.    Allergies  Review of patient's allergies indicates no known allergies.  Home Medications   Current Outpatient Rx  Name  Route  Sig  Dispense  Refill  . ALPRAZOLAM 1 MG PO TABS   Oral   Take 1 mg by mouth 3 (three) times daily as needed. For anxiety         . AMLODIPINE BESYLATE 10 MG PO TABS   Oral   Take 10 mg by mouth daily.         . ASPIRIN 81 MG PO TBEC   Oral   Take 81 mg by mouth daily.           Marland Kitchen BISOPROLOL-HYDROCHLOROTHIAZIDE 5-6.25 MG PO TABS   Oral   Take 0.5 tablets by mouth daily.         . BUPROPION HCL ER (SR) 150 MG PO TB12   Oral   Take 150 mg by mouth 2 (two) times daily.           Marland Kitchen VITAMIN D 1000 UNITS PO CAPS   Oral   Take 5,000 Units by mouth daily.          Marland Kitchen VITAMIN B-12 ER 2000 MCG PO TBCR   Oral   Take 1 tablet by mouth daily.           Marland Kitchen DOCUSATE SODIUM  100 MG PO CAPS   Oral   Take 100 mg by mouth 2 (two) times daily. 2 capsules in the pm          . DOXAZOSIN MESYLATE 8 MG PO TABS   Oral   Take 8 mg by mouth daily.           . ENALAPRIL MALEATE 20 MG PO TABS   Oral   Take 20 mg by mouth 2 (two) times daily.           Marland Kitchen FERROUS SULFATE CR 160 (50 FE) MG PO TBCR   Oral   Take 160 mg by mouth daily.           Marland Kitchen FLUOXETINE HCL 20 MG PO CAPS   Oral   Take 20 mg by mouth daily.           . FUROSEMIDE 80 MG PO TABS   Oral   Take 40 mg by mouth daily.           Marland Kitchen HYDROCODONE-ACETAMINOPHEN 5-325 MG PO TABS   Oral   Take 1 tablet by mouth every 6 (six) hours as needed.         Marland Kitchen MAGNESIUM 250 MG PO TABS   Oral   Take 3 tablets by mouth daily.           . CENTRUM SILVER PO   Oral   Take 1 tablet by mouth daily.           Marland Kitchen OMEPRAZOLE 40 MG PO CPDR   Oral   Take 40 mg by mouth daily.         Marland Kitchen RANITIDINE HCL 300 MG PO TABS   Oral   Take 300 mg by mouth daily.           Marland Kitchen VITAMIN C 500 MG PO TABS   Oral   Take 500 mg  by mouth 2 (two) times daily.             BP 149/62  Pulse 68  Temp 98.3 F (36.8 C) (Oral)  Resp 15  SpO2 99%  Physical Exam  Constitutional: He is oriented to person, place, and time. He appears well-developed and well-nourished.  HENT:  Head: Normocephalic and atraumatic.       No tenderness over temporal artery  Eyes: Conjunctivae normal and EOM are normal. Pupils are equal, round, and reactive to light.  Neck: Full passive range of motion without pain. Neck supple. No thyromegaly present.       No meningismus  Cardiovascular: Normal rate, regular rhythm, S1 normal, S2 normal and intact distal pulses.   Pulmonary/Chest: Effort normal and breath sounds normal.  Abdominal: Soft. Bowel sounds are normal. There is no tenderness. There is no CVA tenderness.  Musculoskeletal: Normal range of motion.  Neurological: He is alert and oriented to person, place, and time. He has normal strength and normal reflexes. No cranial nerve deficit or sensory deficit. He displays a negative Romberg sign. GCS eye subscore is 4. GCS verbal subscore is 5. GCS motor subscore is 6.       No focal deficits  Skin: Skin is warm and dry. No rash noted. No cyanosis. Nails show no clubbing.  Psychiatric: He has a normal mood and affect. His speech is normal and behavior is normal.    ED Course  Procedures (including critical care time)  Results for orders placed during the hospital encounter of 01/20/12  CBC WITH DIFFERENTIAL  Component Value Range   WBC 6.2  4.0 - 10.5 K/uL   RBC 4.10 (*) 4.22 - 5.81 MIL/uL   Hemoglobin 11.7 (*) 13.0 - 17.0 g/dL   HCT 16.1 (*) 09.6 - 04.5 %   MCV 83.9  78.0 - 100.0 fL   MCH 28.5  26.0 - 34.0 pg   MCHC 34.0  30.0 - 36.0 g/dL   RDW 40.9  81.1 - 91.4 %   Platelets 147 (*) 150 - 400 K/uL   Neutrophils Relative 57  43 - 77 %   Neutro Abs 3.5  1.7 - 7.7 K/uL   Lymphocytes Relative 30  12 - 46 %   Lymphs Abs 1.9  0.7 - 4.0 K/uL   Monocytes Relative 10  3 - 12 %     Monocytes Absolute 0.6  0.1 - 1.0 K/uL   Eosinophils Relative 3  0 - 5 %   Eosinophils Absolute 0.2  0.0 - 0.7 K/uL   Basophils Relative 0  0 - 1 %   Basophils Absolute 0.0  0.0 - 0.1 K/uL  PROTIME-INR      Component Value Range   Prothrombin Time 13.6  11.6 - 15.2 seconds   INR 1.05  0.00 - 1.49  BASIC METABOLIC PANEL      Component Value Range   Sodium 139  135 - 145 mEq/L   Potassium 3.9  3.5 - 5.1 mEq/L   Chloride 103  96 - 112 mEq/L   CO2 26  19 - 32 mEq/L   Glucose, Bld 98  70 - 99 mg/dL   BUN 25 (*) 6 - 23 mg/dL   Creatinine, Ser 7.82  0.50 - 1.35 mg/dL   Calcium 9.7  8.4 - 95.6 mg/dL   GFR calc non Af Amer 58 (*) >90 mL/min   GFR calc Af Amer 68 (*) >90 mL/min   Ct Head Wo Contrast  01/20/2012  *RADIOLOGY REPORT*  Clinical Data: The patient fell down stairs 1 week ago.  Diagnosed with subarachnoid hemorrhage.  Today complains of photophobia and headache.  CT HEAD WITHOUT CONTRAST  Technique:  Contiguous axial images were obtained from the base of the skull through the vertex without contrast.  Comparison: 09/19/2011  Findings: Moderate diffuse cerebral and cerebellar atrophy. Low attenuation changes in the deep white matter consistent with small vessel ischemic changes. The ventricles and sulci are otherwise symmetrical without significant effacement, displacement, or dilatation. No mass effect or midline shift. No abnormal extra- axial fluid collections. The grey-white matter junction is distinct. Basal cisterns are not effaced. No acute intracranial hemorrhage. No depressed skull fractures.  Vascular calcifications.  Visualized paranasal sinuses and mastoid air cells are not opacified.  No significant changes since the previous study.  IMPRESSION: No acute intracranial abnormalities.  No evidence of any residual or recurrent hemorrhage.  Mild atrophy and small vessel ischemic changes are chronic.   Original Report Authenticated By: Burman Nieves, M.D.     On recheck, normal  neuro exam, HA slightly returning now more frontal than R sided. Tylenol provided by request, plan close PCP follow up, stable for d/c home.    MDM  HA in PT with recent fall and head bleed.   CT reviewed - no recurrent bleed. Labs reviewed - doubt infectious process  Serial evaluations, VS and nursing notes reviewed.     Sunnie Nielsen, MD 01/20/12 [phone]

## 2012-03-19 ENCOUNTER — Ambulatory Visit
Admission: RE | Admit: 2012-03-19 | Discharge: 2012-03-19 | Disposition: A | Discharge status: Home / Self Care | Payer: Medicare Other | Source: Ambulatory Visit | Attending: Internal Medicine | Admitting: Internal Medicine

## 2012-03-19 ENCOUNTER — Other Ambulatory Visit: Payer: Self-pay | Admitting: Internal Medicine

## 2012-03-19 DIAGNOSIS — I609 Nontraumatic subarachnoid hemorrhage, unspecified: Secondary | ICD-10-CM

## 2012-04-16 ENCOUNTER — Other Ambulatory Visit (HOSPITAL_COMMUNITY): Payer: Self-pay | Admitting: Cardiology

## 2012-04-16 DIAGNOSIS — R609 Edema, unspecified: Secondary | ICD-10-CM

## 2012-04-16 DIAGNOSIS — R0602 Shortness of breath: Secondary | ICD-10-CM

## 2012-04-27 ENCOUNTER — Ambulatory Visit (HOSPITAL_COMMUNITY)
Admission: RE | Admit: 2012-04-27 | Discharge: 2012-04-27 | Disposition: A | Discharge status: Home / Self Care | Payer: Medicare Other | Source: Ambulatory Visit | Attending: Cardiology | Admitting: Cardiology

## 2012-04-27 DIAGNOSIS — I359 Nonrheumatic aortic valve disorder, unspecified: Secondary | ICD-10-CM | POA: Insufficient documentation

## 2012-04-27 DIAGNOSIS — I517 Cardiomegaly: Secondary | ICD-10-CM | POA: Insufficient documentation

## 2012-04-27 DIAGNOSIS — I079 Rheumatic tricuspid valve disease, unspecified: Secondary | ICD-10-CM | POA: Insufficient documentation

## 2012-04-27 DIAGNOSIS — R0602 Shortness of breath: Secondary | ICD-10-CM | POA: Insufficient documentation

## 2012-04-27 DIAGNOSIS — R609 Edema, unspecified: Secondary | ICD-10-CM

## 2012-04-27 DIAGNOSIS — I059 Rheumatic mitral valve disease, unspecified: Secondary | ICD-10-CM | POA: Insufficient documentation

## 2012-04-27 HISTORY — PX: OTHER SURGICAL HISTORY: SHX169

## 2012-04-27 NOTE — Progress Notes (Signed)
Venous Duplex Lower Ext. Completed. Norvell Ureste D  

## 2012-04-27 NOTE — Progress Notes (Signed)
[facility]   2D echo completed 04/27/2012.   Cindy Laymon Stockert, RDCS  

## 2012-04-29 ENCOUNTER — Ambulatory Visit (HOSPITAL_COMMUNITY)
Admission: RE | Admit: 2012-04-29 | Discharge: 2012-04-29 | Disposition: A | Discharge status: Home / Self Care | Payer: Medicare Other | Source: Ambulatory Visit | Attending: Cardiology | Admitting: Cardiology

## 2012-04-29 DIAGNOSIS — R0609 Other forms of dyspnea: Secondary | ICD-10-CM | POA: Insufficient documentation

## 2012-04-29 DIAGNOSIS — R0989 Other specified symptoms and signs involving the circulatory and respiratory systems: Secondary | ICD-10-CM | POA: Insufficient documentation

## 2012-04-29 DIAGNOSIS — R0602 Shortness of breath: Secondary | ICD-10-CM | POA: Insufficient documentation

## 2012-04-29 HISTORY — PX: OTHER SURGICAL HISTORY: SHX169

## 2012-04-29 MED ORDER — AMINOPHYLLINE 25 MG/ML IV SOLN
75.0000 mg | Freq: Once | INTRAVENOUS | Status: AC
  Administered 2012-04-29: 75 mg via INTRAVENOUS

## 2012-04-29 MED ORDER — TECHNETIUM TC 99M SESTAMIBI GENERIC - CARDIOLITE
30.5000 | Freq: Once | INTRAVENOUS | Status: AC | PRN
  Administered 2012-04-29: 31 via INTRAVENOUS

## 2012-04-29 MED ORDER — TECHNETIUM TC 99M SESTAMIBI GENERIC - CARDIOLITE
10.7000 | Freq: Once | INTRAVENOUS | Status: AC | PRN
  Administered 2012-04-29: 11 via INTRAVENOUS

## 2012-04-29 MED ORDER — REGADENOSON 0.4 MG/5ML IV SOLN
0.4000 mg | Freq: Once | INTRAVENOUS | Status: AC
  Administered 2012-04-29: 0.4 mg via INTRAVENOUS

## 2012-04-29 NOTE — Procedures (Addendum)
[facility] [address] [place] [address] [phone]  Cardiology Nuclear Med Study  Gerald [name] is a 67 y.o. male     MRN : [id number]     DOB: [birth year]  Procedure Date: 04/29/2012  Nuclear Med Background Indication for Stress Test:  Evaluation for Ischemia History:  NO PRIOR CARDIAC OR RESP. HISTORY Cardiac Risk Factors: Family History - CAD, History of Smoking, Hypertension, LBBB, Lipids, NIDDM and Obesity  Symptoms:  Dizziness, DOE, Fatigue, Light-Headedness and SOB   Nuclear Pre-Procedure Caffeine/Decaff Intake:  1:00am NPO After: 11 AM   IV Site: R Forearm  IV 0.9% NS with Angio Cath:  22g  Chest Size (in):  44" IV Started by: Emmit Pomfret, RN  Height: 5\' 7"  (1.702 m)  Cup Size: n/a  BMI:  Body mass index is 32.73 kg/(m^2). Weight:  209 lb (94.802 kg)   Tech Comments:  N/A    Nuclear Med Study 1 or 2 day study: 1 day  Stress Test Type:  Lexiscan  Order Authorizing Provider:  Bryan Lemma, MD   Resting Radionuclide: Technetium 28m Sestamibi  Resting Radionuclide Dose: 10.7 mCi   Stress Radionuclide:  Technetium 53m Sestamibi  Stress Radionuclide Dose: 30.5 mCi           Stress Protocol Rest HR: 64 Stress HR: 77  Rest BP: 120/64 Stress BP: 93/51  Exercise Time (min): n/a METS: n/a   Predicted Max HR: 154 bpm % Max HR: 50 bpm Rate Pressure Product: 9240  Dose of Adenosine (mg):  n/a Dose of Lexiscan: 0.4 mg  Dose of Atropine (mg): n/a Dose of Dobutamine: n/a mcg/kg/min (at max HR)  Stress Test Technologist: Esperanza Sheets, CCT Nuclear Technologist: Gonzella Lex, CNMT   Rest Procedure:  Myocardial perfusion imaging was performed at rest 45 minutes following the intravenous administration of Technetium 21m Sestamibi. Stress Procedure:  The patient received IV Lexiscan 0.4 mg over 15-seconds.  Technetium 6m Sestamibi injected at 30-seconds.  The Patient experienced marked SOB and Stomach Pain;  75 mg of IV Aminophylline was administered with resolution of symptoms.  There were no significant changes with Lexiscan.  Quantitative spect images were obtained after a 45 minute delay.  Transient Ischemic Dilatation (Normal <1.22):  1.10 Lung/Heart Ratio (Normal <0.45):  0.30 QGS EDV:  88 ml QGS ESV:  35 ml LV Ejection Fraction: 60%  Signed by      Rest ECG: NSR - Normal EKG and NSR-LBBB  Stress ECG: No significant change from baseline ECG  QPS Raw Data Images:  Normal; no motion artifact; normal heart/lung ratio. Stress Images:  Normal homogeneous uptake in all areas of the myocardium. Rest Images:  Normal homogeneous uptake in all areas of the myocardium. Subtraction (SDS):  No evidence of ischemia.  Impression Exercise Capacity:  Lexiscan with no exercise. BP Response:  Normal blood pressure response. Clinical Symptoms:  No significant symptoms noted. ECG Impression:  Baseline:  LBBB.  EKG uninterpretable due to LBBB at rest and stress. Comparison with Prior Nuclear Study: No previous nuclear study performed  Overall Impression:  Normal stress nuclear study.  LV Wall Motion:  NL LV Function; NL Wall Motion except LBBB related paradoxical septal motion.   Thurmon Fair, MD  04/30/2012 1:23 PM

## 2012-05-27 ENCOUNTER — Encounter: Payer: Self-pay | Admitting: *Deleted

## 2012-05-28 ENCOUNTER — Encounter: Payer: Self-pay | Admitting: Cardiology

## 2012-06-01 ENCOUNTER — Encounter: Payer: Self-pay | Admitting: Cardiology

## 2012-06-01 ENCOUNTER — Ambulatory Visit (INDEPENDENT_AMBULATORY_CARE_PROVIDER_SITE_OTHER): Payer: Medicare Other | Admitting: Cardiology

## 2012-06-01 VITALS — BP 140/70 | HR 60 | Ht 67.0 in | Wt 207.1 lb

## 2012-06-01 DIAGNOSIS — R600 Localized edema: Secondary | ICD-10-CM

## 2012-06-01 DIAGNOSIS — R609 Edema, unspecified: Secondary | ICD-10-CM

## 2012-06-01 DIAGNOSIS — R0609 Other forms of dyspnea: Secondary | ICD-10-CM

## 2012-06-01 DIAGNOSIS — I10 Essential (primary) hypertension: Secondary | ICD-10-CM

## 2012-06-01 DIAGNOSIS — G479 Sleep disorder, unspecified: Secondary | ICD-10-CM

## 2012-06-01 NOTE — Progress Notes (Signed)
Patient ID: Gerald [name], male   DOB: 1945-03-23, 67 y.o.   MRN: [id number] [facility]   Clinic Note: HPI: Gerald [name] is a 67 y.o. male with a PMH below who presents today for followup of recent studies to evaluate mild exertional dyspnea.  Interval History: This is a doing otherwise relatively well. His shortness of breath seems improved. He is starting to walk more and tolerate longer distances of walking. He is using a cane now which makes it less effort to walk. He denies any significant chest discomfort with rest or exertion. No shortness unless really pushed himself. No PND or orthopnea to go with his mild edema. No significant palpitations, lightheadedness, dizziness, wooziness or syncope/near-syncope. No melena/hematochezia or hematuria. No claudication symptoms. No restless leg syndrome symptoms and no significant varicosities or rashes. No severe headache or TIA-like/amaurosis fugax symptoms.  Overall she's been doing better than his last visit. Feeling more active. He was happy to get the results of his stress testing normal.  Past Medical History  Diagnosis Date  . Depression   . Hypertension   . Diabetes mellitus without complication     controlled by diet  . Fatigue   . DOE (dyspnea on exertion)     Echo 04 /22/2014 - normal EF, mild LVH, grade 1 Diastolic Dysfunction; Lexiscan Myoview 04/28/12 - no ishcemia or infarction, LBBB septal wall motion; EF ~60%  . Bilateral edema of lower extremity     LE Dopplers 4/22 - no thrombus or thrombophelbitis, no reflux  . Scrotal edema   . Osteoarthritis     with bilateral hip surgeries and multiple complication, with the most recent  surgery in January 2012  . H/O iron deficiency anemia     with  hemmorrhoids  . Anxiety   . Left bundle branch block      chronic, negative Myoview a normal echo as noted above    Prior cardiac evaluation and past surgical history: Past Surgical History  Procedure  Laterality Date  . Hip surgery  2004  . Cardiology nuclear med study   04 24 2014    overall impression ; NORMAL  STRESS  NUCLEAR STUDY  . Venous duplex  04 22 2014    LOWER EXTREMITY SWELLING -Impressions Lloyd Huger ; This is a normal bilateral  lower extremity venous duplex Doppler evaluation   No Known Allergies  Current Outpatient Prescriptions  Medication Sig Dispense Refill  . ALPRAZolam (XANAX) 1 MG tablet Take 1 mg by mouth 3 (three) times daily as needed. For anxiety      . amLODipine (NORVASC) 10 MG tablet Take 10 mg by mouth daily.      . bisoprolol-hydrochlorothiazide (ZIAC) 5-6.25 MG per tablet Take 0.5 tablets by mouth daily.      Marland Kitchen buPROPion (WELLBUTRIN SR) 150 MG 12 hr tablet Take 150 mg by mouth 2 (two) times daily.        . Cyanocobalamin (VITAMIN B-12) 2000 MCG TBCR Take 1 tablet by mouth every other day.       . doxazosin (CARDURA) 8 MG tablet Take 8 mg by mouth daily.        . enalapril (VASOTEC) 20 MG tablet Take 20 mg by mouth 2 (two) times daily.        Marland Kitchen FLUoxetine (PROZAC) 20 MG capsule Take 20 mg by mouth daily.        . furosemide (LASIX) 80 MG tablet Take 40 mg by mouth daily.        Marland Kitchen  HYDROcodone-acetaminophen (NORCO) 5-325 MG per tablet Take 1 tablet by mouth every 6 (six) hours as needed.      . Magnesium 250 MG TABS Take 3 tablets by mouth as needed.       . meloxicam (MOBIC) 15 MG tablet       . omeprazole (PRILOSEC) 40 MG capsule Take 40 mg by mouth daily.      . vitamin C (ASCORBIC ACID) 500 MG tablet Take 500 mg by mouth 2 (two) times daily as needed.        No current facility-administered medications for this visit.    History   Social History  . Marital Status: Married    Spouse Name: N/A    Number of Children: 2  . Years of Education: N/A   Occupational History  . Retired    Social History Main Topics  . Smoking status: Former Smoker -- 0.50 packs/day for 10 years    Quit date: 05/06/1992  . Smokeless tobacco: Never Used  . Alcohol  Use: 0.5 oz/week    1 drink(s) per week     Comment: couple times a year  . Drug Use: No  . Sexually Active: Not Currently   Other Topics Concern  . Not on file   Social History Narrative   Drinks tea every once in a while and or a cup of coffee   He is a married father of 2,grand father of 6. He now walks around using cructhes,though he really does not get a lot of excerise.He quit smoking 3/ 1/2 years ago and drinks social alcohol. 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 See Epworth score  PHYSICAL EXAM BP 140/70  Pulse 60  Ht 5\' 7"  (1.702 m)  Wt 207 lb 1.6 oz (93.94 kg)  BMI 32.43 kg/m2 General appearance: alert, cooperative, appears stated age, no distress, mildly obese and Pleasant mood and affect Neck: no adenopathy, no carotid bruit, no JVD, supple, symmetrical, trachea midline and thyroid not enlarged, symmetric, no tenderness/mass/nodules Lungs: clear to auscultation bilaterally, normal percussion bilaterally and Nonlabored Heart: regular rate and rhythm, S1, S2 normal, no murmur, click, rub or gallop and Distant heart sounds Abdomen: soft, non-tender; bowel sounds normal; no masses,  no organomegaly and Mildly obese Extremities: edema 1+ below the knee Pulses: 2+ and symmetric  [id number] today: No  ASSESSMENT: Improved symptoms of dyspnea. Stable edema. Borderline blood pressure control.  Bilateral leg edema  Exertional dyspnea  HYPERTENSION  Sleep disorder -Epworth OSA scale = 15  PLAN: Per problem list.   Followup: As needed for worsening symptoms. Elevated Epworth scale for OSA evaluation: Score was 15.  Would confirm this on followup evaluation from primary physician. Otherwise I would recommend referral for sleep study evaluation.  Marykay Lex, M.D., M.S. [facility] [address] [place], [place]  [address]  [phone] Pager # [phone] 06/02/2012 2:04 PM

## 2012-06-01 NOTE — Patient Instructions (Addendum)
Dr Herbie Baltimore went over your results of Myoview,Echo ,Venous Doppler. All were in normal limits except the Echo. It is important to keep blood pressure under control.  You will be fitted for compression stockings you can purchase from our office or medical supply store.  Come back on as needed basis

## 2012-06-02 ENCOUNTER — Encounter: Payer: Self-pay | Admitting: Cardiology

## 2012-06-02 DIAGNOSIS — G479 Sleep disorder, unspecified: Secondary | ICD-10-CM | POA: Insufficient documentation

## 2012-06-02 NOTE — Assessment & Plan Note (Signed)
Borderline control. Please see the recommendations above. His operated dose of an ACE inhibitor. Max dose of amlodipine. I think we can probably convert his beta blocker but would not be able to increase the sotalol to 2 bradycardia concerns. If his dyspnea continues to be an issue that would be my condition. Will otherwise allow this to follow up with her physician.

## 2012-06-02 NOTE — Assessment & Plan Note (Signed)
Would continue to evaluate. This may warrant referral for sleep study evaluation.

## 2012-06-02 NOTE — Assessment & Plan Note (Addendum)
Relatively stable, improved. Would recommend compression stockings. This is likely due to his multiple orthopedic problems. There is no evidence of any venous reflux or, phlebitis/thrombosis on Doppler ultrasounds. Continue with with the current dose of Lasix. We will fit him for compression stockings prior to leaving.

## 2012-06-02 NOTE — Assessment & Plan Note (Signed)
Evaluated with both nuclear stress test and echocardiogram, both of which do not show any significant cardiac etiology for dyspnea other than mild diastolic dysfunction. Treatment for this would be continued blood pressure control. He does state that his symptoms are somewhat improved as his endurance is improving.  1 consideration for more aggressive blood pressure control be to switch him from bisoprolol/HCTZ to carvedilol plus or minus the HCTZ as a separate medication. Out of for management of his blood pressure from position or.

## 2012-11-18 ENCOUNTER — Telehealth: Payer: Self-pay | Admitting: Internal Medicine

## 2012-11-18 DIAGNOSIS — F332 Major depressive disorder, recurrent severe without psychotic features: Secondary | ICD-10-CM

## 2012-11-18 MED ORDER — FLUOXETINE HCL 20 MG PO CAPS: 20.0000 mg | ORAL_CAPSULE | Freq: Every day | ORAL | Status: DC

## 2012-11-18 NOTE — Telephone Encounter (Signed)
Please let patient know Rx is updated for another year - thanx

## 2012-11-18 NOTE — Telephone Encounter (Signed)
Pt lost prozac(generic) medicine and has been taking wifes.  Pt requesting new rx.  Pharm- [facility] [address] [phone] Pt [phone] or cell- [phone] Sending chart back

## 2012-12-14 ENCOUNTER — Encounter: Payer: Self-pay | Admitting: Internal Medicine

## 2012-12-20 ENCOUNTER — Encounter: Payer: Self-pay | Admitting: Internal Medicine

## 2012-12-20 ENCOUNTER — Ambulatory Visit (INDEPENDENT_AMBULATORY_CARE_PROVIDER_SITE_OTHER): Payer: Medicare Other | Admitting: Internal Medicine

## 2012-12-20 VITALS — BP 138/72 | HR 84 | Temp 98.2°F | Resp 18 | Wt 206.4 lb

## 2012-12-20 DIAGNOSIS — E782 Mixed hyperlipidemia: Secondary | ICD-10-CM

## 2012-12-20 DIAGNOSIS — I10 Essential (primary) hypertension: Secondary | ICD-10-CM

## 2012-12-20 DIAGNOSIS — E119 Type 2 diabetes mellitus without complications: Secondary | ICD-10-CM

## 2012-12-20 DIAGNOSIS — Z79899 Other long term (current) drug therapy: Secondary | ICD-10-CM

## 2012-12-20 DIAGNOSIS — E559 Vitamin D deficiency, unspecified: Secondary | ICD-10-CM

## 2012-12-20 LAB — LIPID PANEL
HDL: 48 mg/dL (ref >39)
LDL Cholesterol: 103 mg/dL — ABNORMAL HIGH (ref 0–99)
Total CHOL/HDL Ratio: 3.5 Ratio
VLDL: 18 mg/dL (ref 0–40)

## 2012-12-20 LAB — BASIC METABOLIC PANEL WITH GFR
CO2: 31 mEq/L (ref 19–32)
Calcium: 9.4 mg/dL (ref 8.4–10.5)
Chloride: 106 mEq/L (ref 96–112)
Glucose, Bld: 82 mg/dL (ref 70–99)
Sodium: 142 mEq/L (ref 135–145)

## 2012-12-20 LAB — TSH: TSH: 1.406 u[IU]/mL (ref 0.350–4.500)

## 2012-12-20 LAB — HEPATIC FUNCTION PANEL
ALT: 11 U/L (ref 0–53)
AST: 15 U/L (ref 0–37)
Alkaline Phosphatase: 75 U/L (ref 39–117)
Bilirubin, Direct: 0.1 mg/dL (ref 0.0–0.3)
Total Bilirubin: 0.2 mg/dL — ABNORMAL LOW (ref 0.3–1.2)

## 2012-12-20 LAB — CBC WITH DIFFERENTIAL/PLATELET
Eosinophils Absolute: 0.2 10*3/uL (ref 0.0–0.7)
Eosinophils Relative: 3 % (ref 0–5)
Hemoglobin: 11.6 g/dL — ABNORMAL LOW (ref 13.0–17.0)
Lymphocytes Relative: 21 % (ref 12–46)
Lymphs Abs: 1.4 10*3/uL (ref 0.7–4.0)
MCH: 27.9 pg (ref 26.0–34.0)
MCV: 84.9 fL (ref 78.0–100.0)
Monocytes Relative: 7 % (ref 3–12)
Platelets: 147 10*3/uL — ABNORMAL LOW (ref 150–400)
RBC: 4.16 MIL/uL — ABNORMAL LOW (ref 4.22–5.81)
WBC: 6.7 10*3/uL (ref 4.0–10.5)

## 2012-12-20 LAB — HEMOGLOBIN A1C: Hgb A1c MFr Bld: 6 % — ABNORMAL HIGH (ref <5.7)

## 2012-12-20 MED ORDER — RANITIDINE HCL 300 MG PO TABS: 300.0000 mg | ORAL_TABLET | Freq: Every day | ORAL | Status: DC

## 2012-12-20 MED ORDER — OMEPRAZOLE 40 MG PO CPDR: 40.0000 mg | DELAYED_RELEASE_CAPSULE | Freq: Every day | ORAL | Status: DC

## 2012-12-20 NOTE — Patient Instructions (Signed)

## 2012-12-20 NOTE — Progress Notes (Signed)
Patient ID: Gerald [name], male   DOB: March 10, 1945, 67 y.o.   MRN: [id number]   This very nice 67 yo MBM presents for 3 month follow up with Hypertension, Hyperlipidemia, T2 Diabetes and Vitamin D Deficiency.    BP has been controlled at home. Today's BP is 138/72. Patient denies any cardiac type chest pain, palpitations, dyspnea/orthopnea/PND, dizziness, claudication, or dependent edema.   Hyperlipidemia is controlled with diet & he is off cholesterol meds since better diet and losing weight.  Last Cholesterol was 169, Triglycerides were 126, HDL 45 and LDL 100 in September. Patient denies myalgias or other med SE's.    Also, the patient has history of T2 Diabetes predating from 1982 and he was on oral agents til 2012 when he lost a significant amount of weight. His last A1c was 5.9 % in September. Patient denies any symptoms of reactive hypoglycemia, diabetic polys, paresthesias or visual blurring.   Further, Patient has history of Vitamin D Deficiency with last vitamin D of 56 in September. Patient supplements vitamin D without any suspected side-effects.  Medication Sig Dispense Refill  . ALPRAZolam (XANAX) 1 MG tablet Take 1 mg by mouth 3 (three) times daily as needed. For anxiety      . amLODipine (NORVASC) 10 MG tablet Take 10 mg by mouth daily.      . bisoprolol-hydrochlorothiazide (ZIAC) 5-6.25 MG per tablet Take 0.5 tablets by mouth daily.      Marland Kitchen buPROPion (WELLBUTRIN SR) 150 MG 12 hr tablet Take 150 mg by mouth 2 (two) times daily.        . Cyanocobalamin (VITAMIN B-12) 2000 MCG TBCR Take 1 tablet by mouth every other day.       . doxazosin (CARDURA) 8 MG tablet Take 8 mg by mouth daily.        . enalapril (VASOTEC) 20 MG tablet Take 20 mg by mouth 2 (two) times daily.        Marland Kitchen FLUoxetine (PROZAC) 20 MG capsule Take 1 capsule (20 mg total) by mouth daily.  90 capsule  99  . furosemide (LASIX) 80 MG tablet Take 40 mg by mouth daily.        Marland Kitchen HYDROcodone-acetaminophen (NORCO) 5-325 MG  per tablet Take 1 tablet by mouth every 6 (six) hours as needed.      . Magnesium 250 MG TABS Take 3 tablets by mouth as needed.       . meloxicam (MOBIC) 15 MG tablet       . vitamin C (ASCORBIC ACID) 500 MG tablet Take 500 mg by mouth 2 (two) times daily as needed.           No Known Allergies. Intolerant to Simvastatin.  PMHx:   Past Medical History  Diagnosis Date  . Depression   . Hypertension   . Diabetes mellitus without complication     controlled by diet  . Fatigue   . DOE (dyspnea on exertion)     Echo 04 /22/2014 - normal EF, mild LVH, grade 1 Diastolic Dysfunction; Lexiscan Myoview 04/28/12 - no ishcemia or infarction, LBBB septal wall motion; EF ~60%  . Bilateral edema of lower extremity     LE Dopplers 4/22 - no thrombus or thrombophelbitis, no reflux  . Scrotal edema   . Osteoarthritis     with bilateral hip surgeries and multiple complication, with the most recent  surgery in January 2012  . H/O iron deficiency anemia     with  hemmorrhoids  .  Anxiety   . Left bundle branch block      chronic, negative Myoview a normal echo as noted above   . Hyperlipidemia   . GERD (gastroesophageal reflux disease)   . DJD (degenerative joint disease)   . DDD (degenerative disc disease)   . Vitamin D deficiency   . Colon polyp     FHx:    Reviewed / unchanged  SHx:    Reviewed / unchanged  Systems Review: Constitutional: Denies fever, chills, wt changes, headaches, insomnia, fatigue, night sweats, change in appetite. Eyes: Denies redness, blurred vision, diplopia, discharge, itchy, watery eyes.  ENT: Denies discharge, congestion, post nasal drip, epistaxis, sore throat, earache, hearing loss, dental pain, tinnitus, vertigo, sinus pain, snoring.  CV: Denies chest pain, palpitations, irregular heartbeat, syncope, dyspnea, diaphoresis, orthopnea, PND, claudication, edema. Respiratory: denies cough, dyspnea, DOE, pleurisy, hoarseness, laryngitis, wheezing.  Gastrointestinal:  Denies dysphagia, odynophagia, heartburn, reflux, water brash, abdominal pain or cramps, nausea, vomiting, bloating, diarrhea, constipation, hematemesis, melena, hematochezia,  or hemorrhoids. Genitourinary: Denies dysuria, frequency, urgency, nocturia, hesitancy, discharge, hematuria, flank pain. Musculoskeletal: Denies arthralgias, myalgias, stiffness, jt. swelling, pain, limp, strain/sprain.  Skin: Denies pruritus, rash, hives, warts, acne, eczema, change in skin lesion(s). Neuro: No weakness, tremor, incoordination, spasms, paresthesia, or pain. Psychiatric: Denies confusion, memory loss, or sensory loss. Endo: Denies change in weight, skin, hair change.  Heme/Lymph: No excessive bleeding, bruising, orenlarged lymph nodes.  Filed Vitals:   12/20/12 1129  BP: 138/72  Pulse: 84  Temp: 98.2 F (36.8 C)  Resp: 18    Estimated body mass index is 32.32 kg/(m^2) as calculated from the following:   Height as of 06/01/12: 5\' 7"  (1.702 m).   Weight as of this encounter: 206 lb 6.4 oz (93.622 kg).  On Exam: Appears well nourished - in no distress. Eyes: PERRLA, EOMs, conjunctiva no swelling or erythema. Sinuses: No frontal/maxillary tenderness ENT/Mouth: EAC's clear, TM's nl w/o erythema, bulging. Nares clear w/o erythema, swelling, exudates. Oropharynx clear without erythema or exudates. Oral hygiene is good. Tongue normal, non obstructing. Hearing intact.  Neck: Supple. Thyroid nl. Car 2+/2+ without bruits, nodes or JVD. Chest: Respirations nl with BS clear & equal w/o rales, rhonchi, wheezing or stridor.  Cor: Heart sounds normal w/ regular rate and rhythm without sig. murmurs, gallops, clicks, or rubs. Peripheral pulses normal and equal  without edema.  Abdomen: Soft & bowel sounds normal. Non-tender w/o guarding, rebound, hernias, masses, or organomegaly.  Lymphatics: Unremarkable.  Musculoskeletal: Full ROM all peripheral extremities, joint stability, 5/5 strength, and gait sl broad  based  stabilized with a cane.  Skin: Warm, dry without exposed rashes, lesions, ecchymosis apparent.  Neuro: Cranial nerves intact, reflexes equal bilaterally. Sensory-motor testing grossly intact. Tendon reflexes grossly intact.  Pysch: Alert & oriented x 3. Insight and judgement nl & appropriate. No ideations.  Assessment and Plan:  1. Hypertension - Continue monitor blood pressure at home. Continue diet/meds same.  2. Hyperlipidemia - Continue diet/meds, exercise,& lifestyle modifications. Continue monitor periodic cholesterol/liver & renal functions   3. T2 NIDDM - Continue diet, exercise, lifestyle modifications. Monitor appropriate labs.  4. Vitamin D Deficiency - Continue supplementation.  5. Depression, stable- continue meds same  6. DJD  7. GERD - continue diet and meds  Recommended regular exercise, BP monitoring, weight control, and discussed med and SE's. Recommended labs to assess and monitor clinical status. Further disposition pending results of labs.

## 2012-12-21 ENCOUNTER — Telehealth: Payer: Self-pay | Admitting: *Deleted

## 2012-12-21 NOTE — Telephone Encounter (Signed)
Message copied by Reggy Eye on Tue Dec 21, 2012  1:23 PM ------      Message from: [name]      Created: Mon Dec 20, 2012 10:43 PM       CBC is OK      BMET suggests mild dehydration and need to drink lots more water      Liver, thyroid & mag nl & ok      Chol 161m-hdl 48 - ldl 103 all wonderful - keep up the great work      A1c 6.0% average glucose =126 mg% - avoid sweets/candy & white stuff                   ------

## 2012-12-22 ENCOUNTER — Telehealth: Payer: Self-pay

## 2012-12-22 NOTE — Telephone Encounter (Signed)
Message copied by Joya Martyr on Wed Dec 22, 2012  4:40 PM ------      Message from: [name]      Created: Mon Dec 20, 2012 10:43 PM       CBC is OK      BMET suggests mild dehydration and need to drink lots more water      Liver, thyroid & mag nl & ok      Chol 159m-hdl 48 - ldl 103 all wonderful - keep up the great work      A1c 6.0% average glucose =126 mg% - avoid sweets/candy & white stuff                   ------

## 2013-01-11 ENCOUNTER — Other Ambulatory Visit: Payer: Self-pay | Admitting: Internal Medicine

## 2013-01-11 MED ORDER — BUPROPION HCL ER (SR) 150 MG PO TB12: 150.0000 mg | ORAL_TABLET | Freq: Two times a day (BID) | ORAL | Status: DC

## 2013-01-11 MED ORDER — ENALAPRIL MALEATE 20 MG PO TABS: 20.0000 mg | ORAL_TABLET | Freq: Two times a day (BID) | ORAL | Status: DC

## 2013-01-11 MED ORDER — NYSTATIN 100000 UNIT/GM EX POWD: CUTANEOUS | Status: DC

## 2013-01-12 ENCOUNTER — Ambulatory Visit (INDEPENDENT_AMBULATORY_CARE_PROVIDER_SITE_OTHER): Payer: Medicare Other | Admitting: *Deleted

## 2013-01-12 DIAGNOSIS — K2100 Gastro-esophageal reflux disease with esophagitis, without bleeding: Secondary | ICD-10-CM

## 2013-01-12 MED ORDER — ALPRAZOLAM 1 MG PO TABS: 1.0000 mg | ORAL_TABLET | Freq: Three times a day (TID) | ORAL | Status: DC | PRN

## 2013-01-12 MED ORDER — MELOXICAM 15 MG PO TABS: 15.0000 mg | ORAL_TABLET | Freq: Every day | ORAL | Status: DC

## 2013-01-12 MED ORDER — QUETIAPINE FUMARATE 50 MG PO TABS
50.0000 mg | ORAL_TABLET | Freq: Every day | ORAL | Status: DC
Start: 2013-01-12 — End: 2013-06-23

## 2013-01-12 MED ORDER — BISOPROLOL-HYDROCHLOROTHIAZIDE 5-6.25 MG PO TABS: 0.5000 | ORAL_TABLET | Freq: Every day | ORAL | Status: DC

## 2013-01-12 MED ORDER — HYDROCODONE-ACETAMINOPHEN 5-325 MG PO TABS: 1.0000 | ORAL_TABLET | Freq: Four times a day (QID) | ORAL | Status: DC | PRN

## 2013-01-12 MED ORDER — OMEPRAZOLE 40 MG PO CPDR: 40.0000 mg | DELAYED_RELEASE_CAPSULE | Freq: Every day | ORAL | Status: AC

## 2013-01-12 MED ORDER — AMLODIPINE BESYLATE 10 MG PO TABS: 10.0000 mg | ORAL_TABLET | Freq: Every day | ORAL | Status: DC

## 2013-01-12 NOTE — Progress Notes (Signed)
Patient ID: Gerald [name], male   DOB: [birth year], 68 y.o.   MRN: [id number] Patient unable to be present today for [place] to send new Rx's to [facility].  Patient's wife, Gerald [name], here on his behalf to take care of Rx refills for patient.  Per "new office protocol" all Rx transfers to new mail order pharmacies will present in office for nurse visit to refill/transfer Rx's.

## 2013-02-17 ENCOUNTER — Encounter: Payer: Self-pay | Admitting: Emergency Medicine

## 2013-02-17 ENCOUNTER — Ambulatory Visit (INDEPENDENT_AMBULATORY_CARE_PROVIDER_SITE_OTHER): Payer: Medicare HMO | Admitting: Emergency Medicine

## 2013-02-17 VITALS — BP 122/60 | HR 72 | Temp 98.2°F | Resp 16 | Ht 67.25 in | Wt 205.0 lb

## 2013-02-17 DIAGNOSIS — R238 Other skin changes: Secondary | ICD-10-CM

## 2013-02-17 DIAGNOSIS — N509 Disorder of male genital organs, unspecified: Secondary | ICD-10-CM

## 2013-02-17 DIAGNOSIS — N5082 Scrotal pain: Secondary | ICD-10-CM

## 2013-02-17 DIAGNOSIS — R239 Unspecified skin changes: Secondary | ICD-10-CM

## 2013-02-17 NOTE — Progress Notes (Signed)
Subjective:    Patient ID: Gerald [name], male    DOB: [birth year], 68 y.o.   MRN: [id number]  HPI Comments: 67 yo pleasant male presents with concerns of skin changes. He has noticed change in size and edges of brown spot on cheek. He notes years of sun exposure in the past. He denies hx of skin cancer/ itch/ bleeding from area of concern.  He wants to see urologist because he feels circumcision was not done correctly and when he has erection it pulls more on his scrotum and causes discomfort. He denies any difficulty with urination only erections.  Current Outpatient Prescriptions on File Prior to Visit  Medication Sig Dispense Refill  . ALPRAZolam (XANAX) 1 MG tablet Take 1 tablet (1 mg total) by mouth 3 (three) times daily as needed. For anxiety  270 tablet  0  . amLODipine (NORVASC) 10 MG tablet Take 1 tablet (10 mg total) by mouth daily.  90 tablet  1  . bisoprolol-hydrochlorothiazide (ZIAC) 5-6.25 MG per tablet Take 0.5 tablets by mouth daily.  90 tablet  1  . buPROPion (WELLBUTRIN SR) 150 MG 12 hr tablet Take 1 tablet (150 mg total) by mouth 2 (two) times daily.  60 tablet  0  . doxazosin (CARDURA) 8 MG tablet Take 8 mg by mouth daily.        . enalapril (VASOTEC) 20 MG tablet Take 1 tablet (20 mg total) by mouth 2 (two) times daily.  60 tablet  0  . FLUoxetine (PROZAC) 20 MG capsule Take 1 capsule (20 mg total) by mouth daily.  90 capsule  99  . HYDROcodone-acetaminophen (NORCO/VICODIN) 5-325 MG per tablet Take 1 tablet by mouth every 6 (six) hours as needed.  100 tablet  0  . Magnesium 250 MG TABS Take 1 tablet by mouth daily.       . meloxicam (MOBIC) 15 MG tablet Take 1 tablet (15 mg total) by mouth daily.  90 tablet  1  . nystatin (MYCOSTATIN/NYSTOP) 100000 UNIT/GM POWD Apply BID or as directed  15 g  0  . omeprazole (PRILOSEC) 40 MG capsule Take 1 capsule (40 mg total) by mouth daily.  90 capsule  1  . QUEtiapine (SEROQUEL) 50 MG tablet Take 1 tablet (50 mg total) by mouth at  bedtime.  90 tablet  1  . ranitidine (ZANTAC) 300 MG tablet Take 1 tablet (300 mg total) by mouth at bedtime.  90 tablet  99  . vitamin C (ASCORBIC ACID) 500 MG tablet Take 500 mg by mouth 2 (two) times daily as needed.        No current facility-administered medications on file prior to visit.   No Known Allergies Past Medical History  Diagnosis Date  . Depression   . Hypertension   . Diabetes mellitus without complication     controlled by diet  . Fatigue   . DOE (dyspnea on exertion)     Echo 04 /22/2014 - normal EF, mild LVH, grade 1 Diastolic Dysfunction; Lexiscan Myoview 04/28/12 - no ishcemia or infarction, LBBB septal wall motion; EF ~60%  . Bilateral edema of lower extremity     LE Dopplers 4/22 - no thrombus or thrombophelbitis, no reflux  . Scrotal edema   . Osteoarthritis     with bilateral hip surgeries and multiple complication, with the most recent  surgery in January 2012  . H/O iron deficiency anemia     with  hemmorrhoids  . Anxiety   . Left  bundle branch block      chronic, negative Myoview a normal echo as noted above   . Hyperlipidemia   . GERD (gastroesophageal reflux disease)   . DJD (degenerative joint disease)   . DDD (degenerative disc disease)   . Vitamin D deficiency   . Colon polyp       Review of Systems  Genitourinary: Positive for penile pain.  Skin: Positive for color change.  All other systems reviewed and are negative.   BP 122/60  Pulse 72  Temp(Src) 98.2 F (36.8 C) (Temporal)  Resp 16  Ht 5' 7.25" (1.708 m)  Wt 205 lb (92.987 kg)  BMI 31.87 kg/m2     Objective:   Physical Exam  Nursing note and vitals reviewed. Constitutional: He is oriented to person, place, and time. He appears well-developed and well-nourished.  HENT:  Head: Normocephalic and atraumatic.  Right Ear: External ear normal.  Left Ear: External ear normal.  Nose: Nose normal.  Mouth/Throat: Oropharynx is clear and moist. No oropharyngeal exudate.  Eyes:  Conjunctivae are normal.  Neck: Normal range of motion.  Cardiovascular: Normal rate, regular rhythm, normal heart sounds and intact distal pulses.   Pulmonary/Chest: Effort normal and breath sounds normal.  Abdominal: Soft.  Genitourinary: Penis normal.  AT base of scrotum almost appears to have tight cord per pt with erection, no obvious abnormality noted  Musculoskeletal: Normal range of motion.  Walks with crutch  Lymphadenopathy:    He has no cervical adenopathy.  Neurological: He is alert and oriented to person, place, and time.  Skin: Skin is warm and dry.  Left cheek eraser sizer discoloration with mildly irregular edges. Post scalp 2 separate 2-3 mm dark brown flat areas. Multiple flat nevi med brown over entire head   Psychiatric: He has a normal mood and affect. Judgment normal.          Assessment & Plan:  1. Skin change- ref Derm 2. Scrotal pain with erection only- ref Urology

## 2013-02-17 NOTE — Patient Instructions (Signed)
Moles  Moles are usually harmless growths on the skin. They are accumulations of color (pigment) cells in the skin that:    Can be various colors, from light brown to black.   Can appear anywhere on the body.   May remain flat or become raised.   May contain hairs.   May remain smooth or develop wrinkling.  Most moles are not cancerous (benign). However, some moles may develop changes and become cancerous. It is important to check your moles every month. If you check your moles regularly, you will be able to notice any changes that may occur.   CAUSES   Moles occur when skin cells grow together in clusters instead of spreading out in the skin as they normally do. The reason for this clustering is unknown.  DIAGNOSIS   Your caregiver will perform a skin examination to diagnose your mole.   TREATMENT   Moles usually do not require treatment. If a mole becomes worrisome, your caregiver may choose to take a sample of the mole or remove it entirely, and then send it to a lab for examination.   HOME CARE INSTRUCTIONS   Check your mole(s) monthly for changes that may indicate skin cancer. These changes can include:   A change in size.   A change in color. Note that moles tend to darken during pregnancy or when taking birth control pills (oral contraception).   A change in shape.   A change in the border of the mole.   Wear sunscreen (with an SPF of at least 30) when you spend long periods of time outside. Reapply the sunscreen every 2 3 hours.   Schedule annual appointments with your skin doctor (dermatologist) if you have a large number of moles.  SEEK MEDICAL CARE IF:   Your mole changes size, especially if it becomes larger than a pencil eraser.   Your mole changes in color or develops more than one color.   Your mole becomes itchy or bleeds.   Your mole, or the skin near the mole, becomes painful, sore, red, or swollen.   Your mole becomes scaly, sheds skin, or oozes fluid.    Your mole develops irregular borders.   Your mole becomes flat or develops raised areas.   Your mole becomes hard or soft.  Document Released: 09/17/2000 Document Revised: 09/17/2011 Document Reviewed: 07/07/2011  [employer] Patient Information 2014 [employer]

## 2013-02-24 ENCOUNTER — Other Ambulatory Visit: Payer: Self-pay | Admitting: *Deleted

## 2013-02-24 MED ORDER — ENALAPRIL MALEATE 20 MG PO TABS: 20.0000 mg | ORAL_TABLET | Freq: Two times a day (BID) | ORAL | Status: DC

## 2013-02-24 MED ORDER — BUPROPION HCL ER (SR) 150 MG PO TB12: 150.0000 mg | ORAL_TABLET | Freq: Two times a day (BID) | ORAL | Status: DC

## 2013-02-24 MED ORDER — ENALAPRIL MALEATE 20 MG PO TABS
20.0000 mg | ORAL_TABLET | Freq: Two times a day (BID) | ORAL | Status: DC
Start: 2013-02-24 — End: 2013-03-29

## 2013-03-06 ENCOUNTER — Other Ambulatory Visit: Payer: Self-pay | Admitting: Internal Medicine

## 2013-03-22 ENCOUNTER — Ambulatory Visit: Payer: Self-pay | Admitting: Emergency Medicine

## 2013-03-24 ENCOUNTER — Other Ambulatory Visit: Payer: Self-pay | Admitting: *Deleted

## 2013-03-24 MED ORDER — FUROSEMIDE 40 MG PO TABS: 40.0000 mg | ORAL_TABLET | Freq: Two times a day (BID) | ORAL | Status: DC

## 2013-03-24 MED ORDER — DOXAZOSIN MESYLATE 8 MG PO TABS: 8.0000 mg | ORAL_TABLET | Freq: Every day | ORAL | Status: DC

## 2013-03-29 ENCOUNTER — Other Ambulatory Visit: Payer: Self-pay | Admitting: Emergency Medicine

## 2013-03-29 ENCOUNTER — Ambulatory Visit (INDEPENDENT_AMBULATORY_CARE_PROVIDER_SITE_OTHER): Payer: Medicare HMO | Admitting: Emergency Medicine

## 2013-03-29 ENCOUNTER — Encounter: Payer: Self-pay | Admitting: Emergency Medicine

## 2013-03-29 ENCOUNTER — Ambulatory Visit
Admission: RE | Admit: 2013-03-29 | Discharge: 2013-03-29 | Disposition: A | Discharge status: Home / Self Care | Payer: Commercial Managed Care - HMO | Source: Ambulatory Visit | Attending: Emergency Medicine | Admitting: Emergency Medicine

## 2013-03-29 VITALS — BP 124/60 | HR 86 | Temp 98.2°F | Resp 16 | Ht 67.25 in | Wt 200.0 lb

## 2013-03-29 DIAGNOSIS — R5381 Other malaise: Secondary | ICD-10-CM

## 2013-03-29 DIAGNOSIS — R059 Cough, unspecified: Secondary | ICD-10-CM

## 2013-03-29 DIAGNOSIS — R7309 Other abnormal glucose: Secondary | ICD-10-CM

## 2013-03-29 DIAGNOSIS — R05 Cough: Secondary | ICD-10-CM

## 2013-03-29 DIAGNOSIS — R0602 Shortness of breath: Secondary | ICD-10-CM

## 2013-03-29 DIAGNOSIS — E782 Mixed hyperlipidemia: Secondary | ICD-10-CM

## 2013-03-29 DIAGNOSIS — S81809A Unspecified open wound, unspecified lower leg, initial encounter: Secondary | ICD-10-CM

## 2013-03-29 DIAGNOSIS — R5383 Other fatigue: Principal | ICD-10-CM

## 2013-03-29 DIAGNOSIS — I10 Essential (primary) hypertension: Secondary | ICD-10-CM

## 2013-03-29 DIAGNOSIS — S91009A Unspecified open wound, unspecified ankle, initial encounter: Secondary | ICD-10-CM

## 2013-03-29 DIAGNOSIS — R32 Unspecified urinary incontinence: Secondary | ICD-10-CM

## 2013-03-29 DIAGNOSIS — S81009A Unspecified open wound, unspecified knee, initial encounter: Secondary | ICD-10-CM

## 2013-03-29 DIAGNOSIS — S81812A Laceration without foreign body, left lower leg, initial encounter: Secondary | ICD-10-CM

## 2013-03-29 LAB — CBC WITH DIFFERENTIAL/PLATELET
BASOS PCT: 0 % (ref 0–1)
Basophils Absolute: 0 10*3/uL (ref 0.0–0.1)
EOS ABS: 0.2 10*3/uL (ref 0.0–0.7)
EOS PCT: 3 % (ref 0–5)
HCT: 32.5 % — ABNORMAL LOW (ref 39.0–52.0)
Hemoglobin: 10.8 g/dL — ABNORMAL LOW (ref 13.0–17.0)
LYMPHS ABS: 2 10*3/uL (ref 0.7–4.0)
Lymphocytes Relative: 28 % (ref 12–46)
MCH: 27.6 pg (ref 26.0–34.0)
MCHC: 33.2 g/dL (ref 30.0–36.0)
MCV: 82.9 fL (ref 78.0–100.0)
Monocytes Absolute: 0.8 10*3/uL (ref 0.1–1.0)
Monocytes Relative: 11 % (ref 3–12)
Neutro Abs: 4.1 10*3/uL (ref 1.7–7.7)
Neutrophils Relative %: 58 % (ref 43–77)
PLATELETS: 304 10*3/uL (ref 150–400)
RBC: 3.92 MIL/uL — AB (ref 4.22–5.81)
RDW: 14.4 % (ref 11.5–15.5)
WBC: 7 10*3/uL (ref 4.0–10.5)

## 2013-03-29 LAB — HEMOGLOBIN A1C
HEMOGLOBIN A1C: 6.3 % — AB (ref <5.7)
Mean Plasma Glucose: 134 mg/dL — ABNORMAL HIGH (ref <117)

## 2013-03-29 MED ORDER — PREDNISONE 10 MG PO TABS: ORAL_TABLET | ORAL | Status: DC

## 2013-03-29 MED ORDER — DOXYCYCLINE HYCLATE 100 MG PO TABS: 100.0000 mg | ORAL_TABLET | Freq: Two times a day (BID) | ORAL | Status: DC

## 2013-03-29 NOTE — Patient Instructions (Signed)
Insomnia Insomnia is frequent trouble falling and/or staying asleep. Insomnia can be a long term problem or a short term problem. Both are common. Insomnia can be a short term problem when the wakefulness is related to a certain stress or worry. Long term insomnia is often related to ongoing stress during waking hours and/or poor sleeping habits. Overtime, sleep deprivation itself can make the problem worse. Every little thing feels more severe because you are overtired and your ability to cope is decreased. CAUSES   Stress, anxiety, and depression.  Poor sleeping habits.  Distractions such as TV in the bedroom.  Naps close to bedtime.  Engaging in emotionally charged conversations before bed.  Technical reading before sleep.  Alcohol and other sedatives. They may make the problem worse. They can hurt normal sleep patterns and normal dream activity.  Stimulants such as caffeine for several hours prior to bedtime.  Pain syndromes and shortness of breath can cause insomnia.  Exercise late at night.  Changing time zones may cause sleeping problems (jet lag). It is sometimes helpful to have someone observe your sleeping patterns. They should look for periods of not breathing during the night (sleep apnea). They should also look to see how long those periods last. If you live alone or observers are uncertain, you can also be observed at a sleep clinic where your sleep patterns will be professionally monitored. Sleep apnea requires a checkup and treatment. Give your caregivers your medical history. Give your caregivers observations your family has made about your sleep.  SYMPTOMS   Not feeling rested in the morning.  Anxiety and restlessness at bedtime.  Difficulty falling and staying asleep. TREATMENT   Your caregiver may prescribe treatment for an underlying medical disorders. Your caregiver can give advice or help if you are using alcohol or other drugs for self-medication. Treatment  of underlying problems will usually eliminate insomnia problems.  Medications can be prescribed for short time use. They are generally not recommended for lengthy use.  Over-the-counter sleep medicines are not recommended for lengthy use. They can be habit forming.  You can promote easier sleeping by making lifestyle changes such as:  Using relaxation techniques that help with breathing and reduce muscle tension.  Exercising earlier in the day.  Changing your diet and the time of your last meal. No night time snacks.  Establish a regular time to go to bed.  Counseling can help with stressful problems and worry.  Soothing music and white noise may be helpful if there are background noises you cannot remove.  Stop tedious detailed work at least one hour before bedtime. HOME CARE INSTRUCTIONS   Keep a diary. Inform your caregiver about your progress. This includes any medication side effects. See your caregiver regularly. Take note of:  Times when you are asleep.  Times when you are awake during the night.  The quality of your sleep.  How you feel the next day. This information will help your caregiver care for you.  Get out of bed if you are still awake after 15 minutes. Read or do some quiet activity. Keep the lights down. Wait until you feel sleepy and go back to bed.  Keep regular sleeping and waking hours. Avoid naps.  Exercise regularly.  Avoid distractions at bedtime. Distractions include watching television or engaging in any intense or detailed activity like attempting to balance the household checkbook.  Develop a bedtime ritual. Keep a familiar routine of bathing, brushing your teeth, climbing into bed at the same   time each night, listening to soothing music. Routines increase the success of falling to sleep faster.  Use relaxation techniques. This can be using breathing and muscle tension release routines. It can also include visualizing peaceful scenes. You can  also help control troubling or intruding thoughts by keeping your mind occupied with boring or repetitive thoughts like the old concept of counting sheep. You can make it more creative like imagining planting one beautiful flower after another in your backyard garden.  During your day, work to eliminate stress. When this is not possible use some of the previous suggestions to help reduce the anxiety that accompanies stressful situations. MAKE SURE YOU:   Understand these instructions.  Will watch your condition.  Will get help right away if you are not doing well or get worse. Document Released: 12/21/1999 Document Revised: 03/17/2011 Document Reviewed: 01/20/2007 [facility] Patient Information 2014 [place]. Dehydration, Adult Dehydration means your body does not have as much fluid as it needs. Your kidneys, brain, and heart will not work properly without the right amount of fluids and salt.  HOME CARE  Ask your doctor how to replace body fluid losses (rehydrate).  Drink enough fluids to keep your pee (urine) clear or pale yellow.  Drink small amounts of fluids often if you feel sick to your stomach (nauseous) or throw up (vomit).  Eat like you normally do.  Avoid:  Foods or drinks high in sugar.  Bubbly (carbonated) drinks.  Juice.  Very hot or cold fluids.  Drinks with caffeine.  Fatty, greasy foods.  Alcohol.  Tobacco.  Eating too much.  Gelatin desserts.  Wash your hands to avoid spreading germs (bacteria, viruses).  Only take medicine as told by your doctor.  Keep all doctor visits as told. GET HELP RIGHT AWAY IF:   You cannot drink something without throwing up.  You get worse even with treatment.  Your vomit has blood in it or looks greenish.  Your poop (stool) has blood in it or looks black and tarry.  You have not peed in 6 to 8 hours.  You pee a small amount of very dark pee.  You have a fever.  You pass out (faint).  You have belly  (abdominal) pain that gets worse or stays in one spot (localizes).  You have a rash, stiff neck, or bad headache.  You get easily annoyed, sleepy, or are hard to wake up.  You feel weak, dizzy, or very thirsty. MAKE SURE YOU:   Understand these instructions.  Will watch your condition.  Will get help right away if you are not doing well or get worse. Document Released: 10/19/2008 Document Revised: 03/17/2011 Document Reviewed: 08/12/2010 [facility] Patient Information 2014 [place], [place]. Pneumonia, Adult Pneumonia is an infection of the lungs. It may be caused by a germ (virus or bacteria). Some types of pneumonia can spread easily from person to person. This can happen when you cough or sneeze. HOME CARE  Only take medicine as told by your doctor.  Take your medicine (antibiotics) as told. Finish it even if you start to feel better.  Do not smoke.  You may use a vaporizer or humidifier in your room. This can help loosen thick spit (mucus).  Sleep so you are almost sitting up (semi-upright). This helps reduce coughing.  Rest. A shot (vaccine) can help prevent pneumonia. Shots are often advised for:  People over 27 years old.  Patients on chemotherapy.  People with long-term (chronic) lung problems.  People with  immune system problems. GET HELP RIGHT AWAY IF:   You are getting worse.  You cannot control your cough, and you are losing sleep.  You cough up blood.  Your pain gets worse, even with medicine.  You have a fever.  Any of your problems are getting worse, not better.  You have shortness of breath or chest pain. MAKE SURE YOU:   Understand these instructions.  Will watch your condition.  Will get help right away if you are not doing well or get worse. Document Released: 06/11/2007 Document Revised: 03/17/2011 Document Reviewed: 03/15/2010 [facility] Patient Information 2014 [place].

## 2013-03-29 NOTE — Progress Notes (Deleted)
Patient ID: Gerald [name], male   DOB: [birth year], 68 y.o.   MRN: [id number]  cough x 2 weeks, occasionally productive, feels weak but has improved mildly Prior to illness he sleeps more and naps, bu the is staying awake at night.    ? Rx CHANGE with multiple Mood meds

## 2013-03-30 ENCOUNTER — Other Ambulatory Visit: Payer: Self-pay | Admitting: *Deleted

## 2013-03-30 ENCOUNTER — Other Ambulatory Visit: Payer: Self-pay

## 2013-03-30 ENCOUNTER — Telehealth: Payer: Self-pay | Admitting: Internal Medicine

## 2013-03-30 LAB — LIPID PANEL
Cholesterol: 144 mg/dL (ref 0–200)
HDL: 35 mg/dL — ABNORMAL LOW (ref >39)
LDL Cholesterol: 88 mg/dL (ref 0–99)
TRIGLYCERIDES: 104 mg/dL (ref <150)
Total CHOL/HDL Ratio: 4.1 Ratio
VLDL: 21 mg/dL (ref 0–40)

## 2013-03-30 LAB — BASIC METABOLIC PANEL WITH GFR
BUN: 20 mg/dL (ref 6–23)
CHLORIDE: 104 meq/L (ref 96–112)
CO2: 27 meq/L (ref 19–32)
Calcium: 9.5 mg/dL (ref 8.4–10.5)
Creat: 1.19 mg/dL (ref 0.50–1.35)
GFR, Est African American: 73 mL/min
GFR, Est Non African American: 63 mL/min
Glucose, Bld: 92 mg/dL (ref 70–99)
Potassium: 4.5 mEq/L (ref 3.5–5.3)
Sodium: 141 mEq/L (ref 135–145)

## 2013-03-30 LAB — BRAIN NATRIURETIC PEPTIDE: Brain Natriuretic Peptide: 17.5 pg/mL (ref 0.0–100.0)

## 2013-03-30 LAB — HEPATIC FUNCTION PANEL
ALT: 24 U/L (ref 0–53)
AST: 18 U/L (ref 0–37)
Albumin: 3.5 g/dL (ref 3.5–5.2)
Alkaline Phosphatase: 66 U/L (ref 39–117)
BILIRUBIN DIRECT: 0.1 mg/dL (ref 0.0–0.3)
BILIRUBIN INDIRECT: 0.3 mg/dL (ref 0.2–1.2)
TOTAL PROTEIN: 7.1 g/dL (ref 6.0–8.3)
Total Bilirubin: 0.4 mg/dL (ref 0.2–1.2)

## 2013-03-30 LAB — TSH: TSH: 1.773 u[IU]/mL (ref 0.350–4.500)

## 2013-03-30 LAB — VITAMIN B12: Vitamin B-12: 1189 pg/mL — ABNORMAL HIGH (ref 211–911)

## 2013-03-30 LAB — IRON AND TIBC
%SAT: 21 % (ref 20–55)
Iron: 39 ug/dL — ABNORMAL LOW (ref 42–165)
TIBC: 189 ug/dL — ABNORMAL LOW (ref 215–435)
UIBC: 150 ug/dL (ref 125–400)

## 2013-03-30 LAB — INSULIN, FASTING: Insulin fasting, serum: 9 u[IU]/mL (ref 3–28)

## 2013-03-30 MED ORDER — PREDNISONE 10 MG PO TABS: ORAL_TABLET | ORAL | Status: DC

## 2013-03-30 MED ORDER — DOXYCYCLINE HYCLATE 100 MG PO TABS: 100.0000 mg | ORAL_TABLET | Freq: Two times a day (BID) | ORAL | Status: DC

## 2013-03-30 NOTE — Telephone Encounter (Signed)
ERROR

## 2013-03-30 NOTE — Telephone Encounter (Signed)
PLEASE CALL IN PREDNISONE AND ANTIBIOTIC TO [facility] AT [address]  THEY WHERE SENT TO [facility] YESTERDAY.

## 2013-03-30 NOTE — Progress Notes (Signed)
Subjective:    Patient ID: Gerald [name], male    DOB: 1945-12-28, 68 y.o.   MRN: [id number]  HPI Comments:  68 yo AAM cough x 2 weeks, occasionally productive, feels weak but has improved mildly. He notes no relief with mucinex but has not increased H2o intake.   Prior to illness his wife notes he sleeps more and naps in daytime, but the is staying awake at night. He is taking Seroquel before he goes to bed at late hours (4a.m.). Wife is concerned he may be overly sedated.   He also needs presents for 3 month F/U for HTN, Cholesterol, Pre-Dm, D. Deficient. He has not been eating healthy and often skips meals. He is not exercising due to leg weakness and has f/u at neuro soon. He notes BP good when he checks it.  Last ABN LABS: LDL 103 A1C 6 HGB 11.6  Hypertension Associated symptoms include shortness of breath.  Gastrophageal Reflux He complains of coughing. Associated symptoms include fatigue.        Medication List       This list is accurate as of: 03/29/13 11:59 PM.  Always use your most recent med list.               ALPRAZolam 1 MG tablet  Commonly known as:  XANAX  Take 1 tablet (1 mg total) by mouth 3 (three) times daily as needed. For anxiety     amLODipine 10 MG tablet  Commonly known as:  NORVASC  Take 1 tablet (10 mg total) by mouth daily.     bisoprolol-hydrochlorothiazide 5-6.25 MG per tablet  Commonly known as:  ZIAC  Take 0.5 tablets by mouth daily.     buPROPion 150 MG 12 hr tablet  Commonly known as:  WELLBUTRIN SR  TAKE 1 TABLET BY MOUTH 2 (TWO) TIMES DAILY.     doxazosin 8 MG tablet  Commonly known as:  CARDURA  Take 1 tablet (8 mg total) by mouth daily.     doxycycline 100 MG tablet  Commonly known as:  VIBRA-TABS  Take 1 tablet (100 mg total) by mouth 2 (two) times daily.     enalapril 20 MG tablet  Commonly known as:  VASOTEC  TAKE 1 TABLET BY MOUTH 2 (TWO) TIMES DAILY.     FLUoxetine 20 MG capsule  Commonly known as:  PROZAC  Take  1 capsule (20 mg total) by mouth daily.     furosemide 40 MG tablet  Commonly known as:  LASIX  Take 1 tablet (40 mg total) by mouth 2 (two) times daily.     HYDROcodone-acetaminophen 5-325 MG per tablet  Commonly known as:  NORCO/VICODIN  Take 1 tablet by mouth every 6 (six) hours as needed.     Iron 325 (65 FE) MG Tabs  Take 65 mg by mouth 2 (two) times daily.     Magnesium 250 MG Tabs  Take 1 tablet by mouth daily.     Melatonin 5 MG Subl  Place 10 mg under the tongue at bedtime as needed.     meloxicam 15 MG tablet  Commonly known as:  MOBIC  Take 1 tablet (15 mg total) by mouth daily.     nystatin 100000 UNIT/GM Powd  Apply BID or as directed     omeprazole 40 MG capsule  Commonly known as:  PRILOSEC  Take 1 capsule (40 mg total) by mouth daily.     predniSONE 10 MG tablet  Commonly known  as:  DELTASONE  1 po TID x 3 days, 1 PO BID x 3 days, 1 po QD x 5 days     QUEtiapine 50 MG tablet  Commonly known as:  SEROQUEL  Take 1 tablet (50 mg total) by mouth at bedtime.     ranitidine 300 MG tablet  Commonly known as:  ZANTAC  Take 1 tablet (300 mg total) by mouth at bedtime.     vitamin B-12 1000 MCG tablet  Commonly known as:  CYANOCOBALAMIN  Take 1,000 mcg by mouth daily.     vitamin C 500 MG tablet  Commonly known as:  ASCORBIC ACID  Take 500 mg by mouth 2 (two) times daily as needed.     Vitamin D3 5000 UNITS Caps  Take 10,000 Units by mouth daily.       No Known Allergies Past Medical History  Diagnosis Date  . Depression   . Hypertension   . Diabetes mellitus without complication     controlled by diet  . Fatigue   . DOE (dyspnea on exertion)     Echo 04 /22/2014 - normal EF, mild LVH, grade 1 Diastolic Dysfunction; Lexiscan Myoview 04/28/12 - no ishcemia or infarction, LBBB septal wall motion; EF ~60%  . Bilateral edema of lower extremity     LE Dopplers 4/22 - no thrombus or thrombophelbitis, no reflux  . Scrotal edema   . Osteoarthritis      with bilateral hip surgeries and multiple complication, with the most recent  surgery in January 2012  . H/O iron deficiency anemia     with  hemmorrhoids  . Anxiety   . Left bundle branch block      chronic, negative Myoview a normal echo as noted above   . Hyperlipidemia   . GERD (gastroesophageal reflux disease)   . DJD (degenerative joint disease)   . DDD (degenerative disc disease)   . Vitamin D deficiency   . Colon polyp     Review of Systems  Constitutional: Positive for fatigue.  Respiratory: Positive for cough and shortness of breath.   Musculoskeletal: Positive for arthralgias.  Neurological: Positive for weakness.  Psychiatric/Behavioral: Positive for sleep disturbance.  All other systems reviewed and are negative.   BP 124/60  Pulse 86  Temp(Src) 98.2 F (36.8 C) (Temporal)  Resp 16  Ht 5' 7.25" (1.708 m)  Wt 200 lb (90.719 kg)  BMI 31.10 kg/m2     Objective:   Physical Exam  Nursing note and vitals reviewed. Constitutional: He is oriented to person, place, and time. He appears well-developed and well-nourished.  Appears fatigued  HENT:  Head: Normocephalic and atraumatic.  Right Ear: External ear normal.  Left Ear: External ear normal.  Nose: Nose normal.  Eyes: Conjunctivae and EOM are normal.  Neck: Normal range of motion. Neck supple. No JVD present. No thyromegaly present.  Cardiovascular: Normal rate, regular rhythm, normal heart sounds and intact distal pulses.   1 + bilateral LE edema  Pulmonary/Chest: Effort normal.  ? rhonchi  Abdominal: Soft. Bowel sounds are normal. He exhibits no distension and no mass. There is no tenderness. There is no rebound and no guarding.  Musculoskeletal: He exhibits no edema and no tenderness.  Difficulty with gait, uses crutch  Lymphadenopathy:    He has no cervical adenopathy.  Neurological: He is alert and oriented to person, place, and time. He has normal reflexes. No cranial nerve deficit. Coordination  normal.  Skin: Skin is warm and dry.  SUPERFICIAL EXCORIATION RIGHT SHIN APROX .5CM   Psychiatric: He has a normal mood and affect. His behavior is normal. Judgment and thought content normal.  Minimal agitation with wife and discussion          Assessment & Plan:  1.  3 month F/U for HTN, Cholesterol, Pre-Dm, D. Deficient. Needs healthy diet, cardio QD and obtain healthy weight. Check Labs, Check BP if >130/80 call office 2. Fatigue vs incontinence from ? Urine/ prostate infection vs sleep disturbance vs medication SE- check labs, increase activity and H2O, Sleep hygiene explained, needs to decrease day time napping, try to decrease Seroquel and call with results. May need complete  RX change 3. Cough- ? Pneumonia vs CHF with SOB- Check labs, CXR, Doxy 100 mg and Pred DP 10 mg Both AD, w/c if SX increase or ER. 4. Superficial skin tear- Wound cleaned and tegaderm applied.

## 2013-03-30 NOTE — Telephone Encounter (Signed)
Refilled Rx for Prednisone and Doxy abx to [facility].  Was sent to [facility] mail order in error.

## 2013-03-31 LAB — URINALYSIS, ROUTINE W REFLEX MICROSCOPIC
BILIRUBIN URINE: NEGATIVE
GLUCOSE, UA: NEGATIVE mg/dL
HGB URINE DIPSTICK: NEGATIVE
Ketones, ur: NEGATIVE mg/dL
LEUKOCYTES UA: NEGATIVE
Nitrite: NEGATIVE
Protein, ur: NEGATIVE mg/dL
Specific Gravity, Urine: 1.009 (ref 1.005–1.030)
Urobilinogen, UA: 0.2 mg/dL (ref 0.0–1.0)
pH: 5.5 (ref 5.0–8.0)

## 2013-04-01 LAB — URINE CULTURE
Colony Count: NO GROWTH
Organism ID, Bacteria: NO GROWTH

## 2013-04-28 ENCOUNTER — Ambulatory Visit (INDEPENDENT_AMBULATORY_CARE_PROVIDER_SITE_OTHER): Payer: Medicare HMO | Admitting: Internal Medicine

## 2013-04-28 ENCOUNTER — Encounter: Payer: Self-pay | Admitting: Internal Medicine

## 2013-04-28 VITALS — BP 130/72 | HR 80 | Temp 97.9°F | Resp 16 | Ht 67.5 in | Wt 204.2 lb

## 2013-04-28 DIAGNOSIS — E119 Type 2 diabetes mellitus without complications: Secondary | ICD-10-CM

## 2013-04-28 DIAGNOSIS — I10 Essential (primary) hypertension: Secondary | ICD-10-CM

## 2013-04-28 DIAGNOSIS — D509 Iron deficiency anemia, unspecified: Secondary | ICD-10-CM

## 2013-04-28 DIAGNOSIS — E559 Vitamin D deficiency, unspecified: Secondary | ICD-10-CM | POA: Insufficient documentation

## 2013-04-28 DIAGNOSIS — Z79899 Other long term (current) drug therapy: Secondary | ICD-10-CM | POA: Insufficient documentation

## 2013-04-28 DIAGNOSIS — E785 Hyperlipidemia, unspecified: Secondary | ICD-10-CM

## 2013-04-28 LAB — CBC WITH DIFFERENTIAL/PLATELET
Basophils Absolute: 0 10*3/uL (ref 0.0–0.1)
Basophils Relative: 0 % (ref 0–1)
EOS PCT: 6 % — AB (ref 0–5)
Eosinophils Absolute: 0.3 10*3/uL (ref 0.0–0.7)
HCT: 32.1 % — ABNORMAL LOW (ref 39.0–52.0)
Hemoglobin: 10.8 g/dL — ABNORMAL LOW (ref 13.0–17.0)
LYMPHS ABS: 1.5 10*3/uL (ref 0.7–4.0)
LYMPHS PCT: 31 % (ref 12–46)
MCH: 28.2 pg (ref 26.0–34.0)
MCHC: 33.6 g/dL (ref 30.0–36.0)
MCV: 83.8 fL (ref 78.0–100.0)
MONO ABS: 0.4 10*3/uL (ref 0.1–1.0)
Monocytes Relative: 9 % (ref 3–12)
Neutro Abs: 2.6 10*3/uL (ref 1.7–7.7)
Neutrophils Relative %: 54 % (ref 43–77)
Platelets: 144 10*3/uL — ABNORMAL LOW (ref 150–400)
RBC: 3.83 MIL/uL — AB (ref 4.22–5.81)
RDW: 15.4 % (ref 11.5–15.5)
WBC: 4.9 10*3/uL (ref 4.0–10.5)

## 2013-04-28 LAB — RETICULOCYTES
ABS RETIC: 38.3 10*3/uL (ref 19.0–186.0)
RBC.: 3.83 MIL/uL — ABNORMAL LOW (ref 4.22–5.81)
Retic Ct Pct: 1 % (ref 0.4–2.3)

## 2013-04-28 MED ORDER — TADALAFIL 5 MG PO TABS: 5.0000 mg | ORAL_TABLET | Freq: Every day | ORAL | Status: DC | PRN

## 2013-04-28 NOTE — Progress Notes (Signed)
Subjective:    Patient ID: Gerald [name], male    DOB: February 25, 1945, 68 y.o.   MRN: [id number]  HPI Patient is a 68 yo MBM with HTN, T2 NIDDM, GERD, HLD, Depression who is back for 1 month F/U because of worsening anemia. He's had no Resp,Cardiac, GI or GU Sx's.  Current Outpatient Prescriptions on File Prior to Visit  Medication Sig Dispense Refill  . ALPRAZolam (XANAX) 1 MG tablet Take 1 tablet (1 mg total) by mouth 3 (three) times daily as needed. For anxiety  270 tablet  0  . amLODipine (NORVASC) 10 MG tablet Take 1 tablet (10 mg total) by mouth daily.  90 tablet  1  . bisoprolol-hydrochlorothiazide (ZIAC) 5-6.25 MG per tablet Take 0.5 tablets by mouth daily.  90 tablet  1  . buPROPion (WELLBUTRIN SR) 150 MG 12 hr tablet TAKE 1 TABLET BY MOUTH 2 (TWO) TIMES DAILY.  60 tablet  1  . Cholecalciferol (VITAMIN D3) 5000 UNITS CAPS Take 10,000 Units by mouth daily.      Marland Kitchen doxazosin (CARDURA) 8 MG tablet Take 1 tablet (8 mg total) by mouth daily.  90 tablet  3  . doxycycline (VIBRA-TABS) 100 MG tablet Take 1 tablet (100 mg total) by mouth 2 (two) times daily.  20 tablet  0  . enalapril (VASOTEC) 20 MG tablet TAKE 1 TABLET BY MOUTH 2 (TWO) TIMES DAILY.  60 tablet  1  . Ferrous Sulfate (IRON) 325 (65 FE) MG TABS Take 65 mg by mouth 2 (two) times daily.      Marland Kitchen FLUoxetine (PROZAC) 20 MG capsule Take 1 capsule (20 mg total) by mouth daily.  90 capsule  99  . furosemide (LASIX) 40 MG tablet Take 1 tablet (40 mg total) by mouth 2 (two) times daily.  180 tablet  3  . HYDROcodone-acetaminophen (NORCO/VICODIN) 5-325 MG per tablet Take 1 tablet by mouth every 6 (six) hours as needed.  100 tablet  0  . Magnesium 250 MG TABS Take 1 tablet by mouth daily.       . Melatonin 5 MG SUBL Place 10 mg under the tongue at bedtime as needed.      . meloxicam (MOBIC) 15 MG tablet Take 1 tablet (15 mg total) by mouth daily.  90 tablet  1  . nystatin (MYCOSTATIN/NYSTOP) 100000 UNIT/GM POWD Apply BID or as directed  15 g   0  . omeprazole (PRILOSEC) 40 MG capsule Take 1 capsule (40 mg total) by mouth daily.  90 capsule  1  . QUEtiapine (SEROQUEL) 50 MG tablet Take 1 tablet (50 mg total) by mouth at bedtime.  90 tablet  1  . ranitidine (ZANTAC) 300 MG tablet Take 1 tablet (300 mg total) by mouth at bedtime.  90 tablet  99  . vitamin B-12 (CYANOCOBALAMIN) 1000 MCG tablet Take 1,000 mcg by mouth daily.      . vitamin C (ASCORBIC ACID) 500 MG tablet Take 500 mg by mouth 2 (two) times daily as needed.        No current facility-administered medications on file prior to visit.   No Known Allergies  Past Medical History  Diagnosis Date  . Depression   . Hypertension   . Diabetes mellitus without complication     controlled by diet  . Fatigue   . DOE (dyspnea on exertion)     Echo 04 /22/2014 - normal EF, mild LVH, grade 1 Diastolic Dysfunction; Lexiscan Myoview 04/28/12 - no ishcemia or infarction,  LBBB septal wall motion; EF ~60%  . Bilateral edema of lower extremity     LE Dopplers 4/22 - no thrombus or thrombophelbitis, no reflux  . Scrotal edema   . Osteoarthritis     with bilateral hip surgeries and multiple complication, with the most recent  surgery in January 2012  . H/O iron deficiency anemia     with  hemmorrhoids  . Anxiety   . Left bundle branch block      chronic, negative Myoview a normal echo as noted above   . Hyperlipidemia   . GERD (gastroesophageal reflux disease)   . DJD (degenerative joint disease)   . DDD (degenerative disc disease)   . Vitamin D deficiency   . Colon polyp    Review of Systems In addition to the HPI above,  No Fever-chills,  No Headache, No changes with Vision or hearing,  No problems swallowing food or Liquids,  No Chest pain or productive Cough or Shortness of Breath,  No Abdominal pain, No Nausea or Vommitting, Bowel movements are regular,  No Blood in stool or Urine,  No dysuria,  No new skin rashes or bruises,  No new joints pains-aches,  No new  weakness, tingling, numbness in any extremity,  No recent weight loss,  No polyuria, polydypsia or polyphagia,  No significant Mental Stressors.  A full 10 point Review of Systems was done, except as stated above, all other Review of Systems were negative  Objective:   Physical Exam  BP 130/72  Pulse 80  Temp(Src) 97.9 F (36.6 C) (Temporal)  Resp 16  Ht 5' 7.5" (1.715 m)  Wt 204 lb 3.2 oz (92.625 kg)  BMI 31.49 kg/m2  HEENT - Eac's patent. TM's Nl.EOM's full. PERRLA. NasoOroPharynx clear. Neck - supple. Nl Thyroid. No bruits nodes JVD Chest - Clear equal BS Cor - Nl HS. RRR w/o sig MGR. PP 1(+) No edema. Abd - No palpable organomegaly, masses or tenderness. BS nl. MS- FROM. w/o deformities. Muscle power tone and bulk Nl. Gait Nl. Neuro - No obvious Cr N abnormalities. Sensory, motor and Cerebellar functions appear Nl w/o focal abnormalities.  Assessment & Plan:   1. HYPERTENSION  2. ANEMIA, IRON DEFICIENCY - CBC with Differential - Reticulocytes Count  3. DM  4. HYPERLIPIDEMIA  5. Encounter for long-term (current) use of other medications  6. Vitamin D Deficiency

## 2013-04-28 NOTE — Patient Instructions (Signed)

## 2013-05-05 ENCOUNTER — Other Ambulatory Visit: Payer: Self-pay | Admitting: *Deleted

## 2013-05-05 DIAGNOSIS — F332 Major depressive disorder, recurrent severe without psychotic features: Secondary | ICD-10-CM

## 2013-05-05 MED ORDER — FLUOXETINE HCL 20 MG PO CAPS: 20.0000 mg | ORAL_CAPSULE | Freq: Every day | ORAL | Status: DC

## 2013-05-20 ENCOUNTER — Other Ambulatory Visit: Payer: Self-pay | Admitting: *Deleted

## 2013-05-20 DIAGNOSIS — K2100 Gastro-esophageal reflux disease with esophagitis, without bleeding: Secondary | ICD-10-CM

## 2013-05-20 MED ORDER — RANITIDINE HCL 300 MG PO TABS: 300.0000 mg | ORAL_TABLET | Freq: Every day | ORAL | Status: AC

## 2013-05-24 ENCOUNTER — Ambulatory Visit: Payer: Self-pay | Admitting: Emergency Medicine

## 2013-05-31 ENCOUNTER — Other Ambulatory Visit: Payer: Self-pay | Admitting: *Deleted

## 2013-05-31 MED ORDER — ALPRAZOLAM 1 MG PO TABS: ORAL_TABLET | ORAL | Status: DC

## 2013-06-02 ENCOUNTER — Telehealth: Payer: Self-pay | Admitting: *Deleted

## 2013-06-02 NOTE — Telephone Encounter (Signed)
DR Frederik Pear = ?  [employer] PROBLEMS THINKS WE WERE STARTING A REFERRAL FOR PT TO FOLLOW UP WITH THIS DR? PTS WIFE IS CHECKING THE STATUS

## 2013-06-08 NOTE — Telephone Encounter (Signed)
Authorized to see Dr.Rowan until 08-29-13. Pt's spouse aware and will call to sched f/u appt with Dr.Rowan.

## 2013-06-23 ENCOUNTER — Other Ambulatory Visit: Payer: Self-pay | Admitting: Internal Medicine

## 2013-06-24 ENCOUNTER — Ambulatory Visit (INDEPENDENT_AMBULATORY_CARE_PROVIDER_SITE_OTHER): Payer: Commercial Managed Care - HMO | Admitting: Internal Medicine

## 2013-06-24 ENCOUNTER — Encounter: Payer: Self-pay | Admitting: Internal Medicine

## 2013-06-24 VITALS — BP 102/64 | HR 64 | Temp 97.5°F | Resp 16 | Ht 67.5 in | Wt 205.6 lb

## 2013-06-24 DIAGNOSIS — Z79899 Other long term (current) drug therapy: Secondary | ICD-10-CM

## 2013-06-24 DIAGNOSIS — Z125 Encounter for screening for malignant neoplasm of prostate: Secondary | ICD-10-CM

## 2013-06-24 DIAGNOSIS — E559 Vitamin D deficiency, unspecified: Secondary | ICD-10-CM

## 2013-06-24 DIAGNOSIS — Z789 Other specified health status: Secondary | ICD-10-CM

## 2013-06-24 DIAGNOSIS — M2550 Pain in unspecified joint: Secondary | ICD-10-CM

## 2013-06-24 DIAGNOSIS — Z0000 Encounter for general adult medical examination without abnormal findings: Secondary | ICD-10-CM

## 2013-06-24 DIAGNOSIS — I10 Essential (primary) hypertension: Secondary | ICD-10-CM

## 2013-06-24 DIAGNOSIS — Z1212 Encounter for screening for malignant neoplasm of rectum: Secondary | ICD-10-CM

## 2013-06-24 DIAGNOSIS — F329 Major depressive disorder, single episode, unspecified: Secondary | ICD-10-CM

## 2013-06-24 DIAGNOSIS — E785 Hyperlipidemia, unspecified: Secondary | ICD-10-CM

## 2013-06-24 DIAGNOSIS — F3289 Other specified depressive episodes: Secondary | ICD-10-CM

## 2013-06-24 DIAGNOSIS — E1129 Type 2 diabetes mellitus with other diabetic kidney complication: Secondary | ICD-10-CM

## 2013-06-24 LAB — CBC WITH DIFFERENTIAL/PLATELET
BASOS PCT: 0 % (ref 0–1)
Basophils Absolute: 0 10*3/uL (ref 0.0–0.1)
Eosinophils Absolute: 0.3 10*3/uL (ref 0.0–0.7)
Eosinophils Relative: 5 % (ref 0–5)
HEMATOCRIT: 34.1 % — AB (ref 39.0–52.0)
Hemoglobin: 11.5 g/dL — ABNORMAL LOW (ref 13.0–17.0)
LYMPHS ABS: 2.3 10*3/uL (ref 0.7–4.0)
Lymphocytes Relative: 39 % (ref 12–46)
MCH: 28.5 pg (ref 26.0–34.0)
MCHC: 33.7 g/dL (ref 30.0–36.0)
MCV: 84.6 fL (ref 78.0–100.0)
MONO ABS: 0.7 10*3/uL (ref 0.1–1.0)
Monocytes Relative: 12 % (ref 3–12)
Neutro Abs: 2.6 10*3/uL (ref 1.7–7.7)
Neutrophils Relative %: 44 % (ref 43–77)
Platelets: 145 10*3/uL — ABNORMAL LOW (ref 150–400)
RBC: 4.03 MIL/uL — ABNORMAL LOW (ref 4.22–5.81)
RDW: 15.4 % (ref 11.5–15.5)
WBC: 5.9 10*3/uL (ref 4.0–10.5)

## 2013-06-24 LAB — HEMOGLOBIN A1C
Hgb A1c MFr Bld: 5.6 % (ref <5.7)
MEAN PLASMA GLUCOSE: 114 mg/dL (ref <117)

## 2013-06-24 MED ORDER — QUETIAPINE FUMARATE 100 MG PO TABS: 100.0000 mg | ORAL_TABLET | Freq: Every day | ORAL | Status: DC

## 2013-06-24 MED ORDER — TRAZODONE HCL 150 MG PO TABS: 150.0000 mg | ORAL_TABLET | Freq: Every day | ORAL | Status: DC

## 2013-06-24 NOTE — Progress Notes (Signed)
Patient ID: Gerald [name], male   DOB: 05-Jun-1945, 68 y.o.   MRN: [id number]   Annual Screening Comprehensive Examination  This very nice 68 y.o.MBM presents for complete physical.  Patient has been followed for HTN, T2DM w/ CKD, Hyperlipidemia, and Vitamin D Deficiency. Patient has Hx/o bilat THR and in Feb 2012,he had his Left Hip prosthesis removed for infection and currently has no Lt hip joint prosthesis and ambulates with a crutch for Left sided support. He continues f/u with Dr Mayer Camel who has apparently referred him for upcoming 2sd opinion re: his Lt hip.   Pt's HTN predates since 1989.  Patient's BP has been controlled at home. Today's BP: 102/64 mmHg. Patient denies any cardiac symptoms as chest pain, palpitations, shortness of breath, dizziness or ankle swelling.   Patient's hyperlipidemia is controlled with diet and medications. Patient denies myalgias or other medication SE's. Last cholesterol last visit was 144, triglycerides 104, HDL 35 and LDL 88 in Mar 2015 - all at goal.     Patient has T2_NIDDM predating due to the typical American Morbid Obesity since 1982 and was on oral agents until  2013 when he lost weight from 248# down to 195# and was abble to control his sugars with diet. He also has Stage 2 CKD (GFR  ) and his last A1c was 6.3% in Mar 2015. Patient denies reactive hypoglycemic symptoms, visual blurring, diabetic polys, or paresthesias.   Finally, patient has history of Vitamin D Deficiency of 44 in 2009 and last vitamin D was 77 in Sept 2014 and Patient was advised to increase his dose.  No Known Allergies  Past Medical History  Diagnosis Date  . Depression   . Hypertension   . Diabetes mellitus without complication     controlled by diet  . Fatigue   . DOE (dyspnea on exertion)     Echo 04 /22/2014 - normal EF, mild LVH, grade 1 Diastolic Dysfunction; Lexiscan Myoview 04/28/12 - no ishcemia or infarction, LBBB septal wall motion; EF ~60%  . Bilateral edema of  lower extremity     LE Dopplers 4/22 - no thrombus or thrombophelbitis, no reflux  . Scrotal edema   . Osteoarthritis     with bilateral hip surgeries and multiple complication, with the most recent  surgery in January 2012  . H/O iron deficiency anemia     with  hemmorrhoids  . Anxiety   . Left bundle branch block      chronic, negative Myoview a normal echo as noted above   . Hyperlipidemia   . GERD (gastroesophageal reflux disease)   . DJD (degenerative joint disease)   . DDD (degenerative disc disease)   . Vitamin D deficiency   . Colon polyp     Past Surgical History  Procedure Laterality Date  . Hip surgery  2004  . Cardiology nuclear med study   04 24 2014    overall impression ; NORMAL  STRESS  NUCLEAR STUDY  . Venous duplex  04 22 2014    LOWER EXTREMITY SWELLING -Impressions [name] ; This is a normal bilateral  lower extremity venous duplex Doppler evaluation    Family History  Problem Relation Age of Onset  . Dementia Mother   . Hypertension Mother   . Heart disease Mother   . Diabetes Mother   . Heart attack Maternal Grandmother   . Diabetes Maternal Grandfather     History   Social History  . Marital Status: Married  Spouse Name: N/A    Number of Children: 2  . Years of Education: N/A   Occupational History  . Retired    Social History Main Topics  . Smoking status: Former Smoker -- 0.50 packs/day for 10 years    Quit date: 03/30/1983  . Smokeless tobacco: Never Used  . Alcohol Use: 0.5 oz/week    1 drink(s) per week     Comment: couple times a year  . Drug Use: No  . Sexual Activity: Not Currently   Other Topics Concern  . Not on file   Social History Narrative   Drinks tea every once in a while and or a cup of coffee   He is a married father of 2,grand father of 5. He now walks around using cructhes,though he really does not get a lot of excerise.He quit smoking 3/ 1/2 years ago and drinks social alcohol. 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    ROS Constitutional: Denies fever, chills, weight loss/gain, headaches, insomnia, fatigue, night sweats or change in appetite. Eyes: Denies redness, blurred vision, diplopia, discharge, itchy or watery eyes.  ENT: Denies discharge, congestion, post nasal drip, epistaxis, sore throat, earache, hearing loss, dental pain, Tinnitus, Vertigo, Sinus pain or snoring.  Cardio: Denies chest pain, palpitations, irregular heartbeat, syncope, dyspnea, diaphoresis, orthopnea, PND, claudication or edema Respiratory: denies cough, dyspnea, DOE, pleurisy, hoarseness, laryngitis or wheezing.  Gastrointestinal: Denies dysphagia, heartburn, reflux, water brash, pain, cramps, nausea, vomiting, bloating, diarrhea, constipation, hematemesis, melena, hematochezia, jaundice or hemorrhoids Genitourinary: Denies dysuria, frequency, urgency, nocturia, hesitancy, discharge, hematuria or flank pain Musculoskeletal: Denies arthralgia, myalgia, stiffness, Jt. Swelling, pain, limp or strain/sprain. Denies falls. Skin: Denies puritis, rash, hives, warts, acne, eczema or change in skin lesion Neuro: No weakness, tremor, incoordination, spasms, paresthesia or pain Psychiatric: Denies confusion, memory loss or sensory loss. Depression is controlled. Endocrine: Denies change in weight, skin, hair change, nocturia, and paresthesia, diabetic polys, visual blurring or hyper / hypo glycemic episodes.  Heme/Lymph: No excessive bleeding, bruising or enlarged lymph nodes.  Physical Exam  BP 102/64  Pulse 64  Temp 97.5 F   Resp 16  Ht 5' 7.5"   Wt 205 lb 9.6 oz   BMI 31.71 kg/m2  General Appearance: Well nourished, in no apparent distress. Eyes: PERRLA, EOMs, conjunctiva no swelling or erythema, normal fundi and vessels. Sinuses: No frontal/maxillary tenderness ENT/Mouth: EACs patent / TMs  nl. Nares clear without erythema, swelling, mucoid exudates.  Oral hygiene is good. No erythema, swelling, or exudate. Tongue normal, non-obstructing. Tonsils not swollen or erythematous. Hearing normal.  Neck: Supple, thyroid normal. No bruits, nodes or JVD. Respiratory: Respiratory effort normal.  BS equal and clear bilateral without rales, rhonci, wheezing or stridor. Cardio: Heart sounds are normal with regular rate and rhythm and no murmurs, rubs or gallops. Peripheral pulses are normal and equal bilaterally without edema. No aortic or femoral bruits. Chest: symmetric with normal excursions and percussion.  Abdomen: Flat, soft, with bowl sounds. Nontender, no guarding, rebound, hernias, masses, or organomegaly.  Lymphatics: Non tender without lymphadenopathy.  Genitourinary: No hernias.Testes nl. DRE - prostate nl for age - smooth & firm w/o nodules. Musculoskeletal: Full ROM all peripheral extremities, joint stability, 5/5 strength, and  Gait supported by a crutch to avoid Lt sided weight bearing. Skin: Warm and dry without rashes, lesions, cyanosis, clubbing or  ecchymosis.  Neuro: Cranial nerves intact, reflexes equal  bilaterally. Normal muscle tone, no cerebellar symptoms. Sensation intact.  Pysch: Awake and oriented X 3, normal affect, insight and judgment appropriate.   Assessment and Plan  1. Annual Screening Examination 2. Hypertension  3. Hyperlipidemia 4. T2_NIDDM w/Stage 2 CKD 5. Vitamin D Deficiency 6. Depression 7 Lt Hip absentio  Continue prudent diet as discussed, weight control, BP monitoring, regular exercise, and medications as discussed.  Discussed med effects and SE's. Routine screening labs and tests as requested with regular follow-up as recommended.

## 2013-06-24 NOTE — Patient Instructions (Signed)
Recommend the book "the END of DIETING" by Dr Joel Furman   Get the  Book "The END of DIABETES " by Dr Joel Fuhrman  At [employer] - get book & Audio CD's      Being diabetic has a  300% increased risk for heart attack, stroke, cancer, and alzheimer- type vascular dementia. It is very important that you work harder with diet by avoiding all foods that are white except chicken & fish. Avoid white rice (brown & wild rice is OK), white potatoes (sweetpotatoes in moderation is OK), White bread or wheat bread or anything made out of white flour like bagels, donuts, rolls, buns, biscuits, cakes, pastries, cookies, pizza crust, and pasta (made from white flour & egg whites) - vegetarian pasta or spinach or wheat pasta is OK. Multigrain breads like Arnold's or Pepperidge Farm, or multigrain sandwich thins or flatbreads.  Diet, exercise and weight loss can reverse and cure diabetes in the early stages.  Diet, exercise and weight loss is very important in the control and prevention of complications of diabetes which affects every system in your body, ie. Brain - dementia/stroke, eyes - glaucoma/blindness, heart - heart attack/heart failure, kidneys - dialysis, stomach - gastric paralysis, intestines - malabsorption, nerves - severe painful neuritis, circulation - gangrene & loss of a leg(s), and finally cancer and Alzheimers.    I recommend avoid fried & greasy foods,  sweets/candy, white rice (brown or wild rice or Quinoa is OK), white potatoes (sweet potatoes are OK) - anything made from white flour - bagels, doughnuts, rolls, buns, biscuits,white and wheat breads, pizza crust and traditional pasta made of white flour & egg white(vegetarian pasta or spinach or wheat pasta is OK).  Multi-grain bread is OK - like multi-grain flat bread or sandwich thins. Avoid alcohol in excess. Exercise is also important.    Eat all the vegetables you want - avoid meat, especially red meat and dairy - especially cheese.  Cheese is  the most concentrated form of trans-fats which is the worst thing to clog up our arteries. Veggie cheese is OK which can be found in the fresh produce section at [employer] or [employer] or [employer]   

## 2013-06-25 LAB — LIPID PANEL
CHOLESTEROL: 187 mg/dL (ref 0–200)
HDL: 38 mg/dL — AB (ref >39)
LDL Cholesterol: 92 mg/dL (ref 0–99)
TRIGLYCERIDES: 287 mg/dL — AB (ref <150)
Total CHOL/HDL Ratio: 4.9 Ratio
VLDL: 57 mg/dL — ABNORMAL HIGH (ref 0–40)

## 2013-06-25 LAB — INSULIN, FASTING: INSULIN FASTING, SERUM: 11 u[IU]/mL (ref 3–28)

## 2013-06-25 LAB — HEPATIC FUNCTION PANEL
ALBUMIN: 3.8 g/dL (ref 3.5–5.2)
ALT: 10 U/L (ref 0–53)
AST: 17 U/L (ref 0–37)
Alkaline Phosphatase: 70 U/L (ref 39–117)
Bilirubin, Direct: 0.1 mg/dL (ref 0.0–0.3)
Indirect Bilirubin: 0.2 mg/dL (ref 0.2–1.2)
TOTAL PROTEIN: 6.7 g/dL (ref 6.0–8.3)
Total Bilirubin: 0.3 mg/dL (ref 0.2–1.2)

## 2013-06-25 LAB — URINALYSIS, MICROSCOPIC ONLY
BACTERIA UA: NONE SEEN
CRYSTALS: NONE SEEN
Casts: NONE SEEN
Squamous Epithelial / LPF: NONE SEEN

## 2013-06-25 LAB — BASIC METABOLIC PANEL WITH GFR
BUN: 21 mg/dL (ref 6–23)
CALCIUM: 9.4 mg/dL (ref 8.4–10.5)
CO2: 24 meq/L (ref 19–32)
Chloride: 105 mEq/L (ref 96–112)
Creat: 1.35 mg/dL (ref 0.50–1.35)
GFR, EST AFRICAN AMERICAN: 62 mL/min
GFR, Est Non African American: 54 mL/min — ABNORMAL LOW
Glucose, Bld: 91 mg/dL (ref 70–99)
Potassium: 3.9 mEq/L (ref 3.5–5.3)
Sodium: 140 mEq/L (ref 135–145)

## 2013-06-25 LAB — PSA: PSA: 2.2 ng/mL (ref <=4.00)

## 2013-06-25 LAB — MICROALBUMIN / CREATININE URINE RATIO
Creatinine, Urine: 47.1 mg/dL
Microalb Creat Ratio: 10.6 mg/g (ref 0.0–30.0)
Microalb, Ur: 0.5 mg/dL (ref 0.00–1.89)

## 2013-06-25 LAB — MAGNESIUM: Magnesium: 2 mg/dL (ref 1.5–2.5)

## 2013-06-25 LAB — TSH: TSH: 2.811 u[IU]/mL (ref 0.350–4.500)

## 2013-06-25 LAB — VITAMIN D 25 HYDROXY (VIT D DEFICIENCY, FRACTURES): VIT D 25 HYDROXY: 67 ng/mL (ref 30–89)

## 2013-06-25 LAB — SEDIMENTATION RATE: SED RATE: 11 mm/h (ref 0–16)

## 2013-07-15 ENCOUNTER — Emergency Department (HOSPITAL_COMMUNITY): Payer: Medicare HMO

## 2013-07-15 ENCOUNTER — Emergency Department (HOSPITAL_COMMUNITY)
Admission: EM | Admit: 2013-07-15 | Discharge: 2013-07-15 | Disposition: A | Discharge status: Home / Self Care | Payer: Medicare HMO | Attending: Emergency Medicine | Admitting: Emergency Medicine

## 2013-07-15 ENCOUNTER — Encounter (HOSPITAL_COMMUNITY): Payer: Self-pay | Admitting: Emergency Medicine

## 2013-07-15 DIAGNOSIS — Z8719 Personal history of other diseases of the digestive system: Secondary | ICD-10-CM | POA: Insufficient documentation

## 2013-07-15 DIAGNOSIS — F411 Generalized anxiety disorder: Secondary | ICD-10-CM | POA: Insufficient documentation

## 2013-07-15 DIAGNOSIS — R55 Syncope and collapse: Secondary | ICD-10-CM | POA: Insufficient documentation

## 2013-07-15 DIAGNOSIS — R5383 Other fatigue: Secondary | ICD-10-CM

## 2013-07-15 DIAGNOSIS — E119 Type 2 diabetes mellitus without complications: Secondary | ICD-10-CM | POA: Insufficient documentation

## 2013-07-15 DIAGNOSIS — Z87891 Personal history of nicotine dependence: Secondary | ICD-10-CM | POA: Insufficient documentation

## 2013-07-15 DIAGNOSIS — Z7982 Long term (current) use of aspirin: Secondary | ICD-10-CM | POA: Insufficient documentation

## 2013-07-15 DIAGNOSIS — R531 Weakness: Secondary | ICD-10-CM

## 2013-07-15 DIAGNOSIS — M1990 Unspecified osteoarthritis, unspecified site: Secondary | ICD-10-CM | POA: Insufficient documentation

## 2013-07-15 DIAGNOSIS — Z791 Long term (current) use of non-steroidal anti-inflammatories (NSAID): Secondary | ICD-10-CM | POA: Insufficient documentation

## 2013-07-15 DIAGNOSIS — Z862 Personal history of diseases of the blood and blood-forming organs and certain disorders involving the immune mechanism: Secondary | ICD-10-CM | POA: Insufficient documentation

## 2013-07-15 DIAGNOSIS — Z87448 Personal history of other diseases of urinary system: Secondary | ICD-10-CM | POA: Insufficient documentation

## 2013-07-15 DIAGNOSIS — I10 Essential (primary) hypertension: Secondary | ICD-10-CM | POA: Insufficient documentation

## 2013-07-15 DIAGNOSIS — IMO0002 Reserved for concepts with insufficient information to code with codable children: Secondary | ICD-10-CM | POA: Insufficient documentation

## 2013-07-15 DIAGNOSIS — R5381 Other malaise: Secondary | ICD-10-CM | POA: Insufficient documentation

## 2013-07-15 DIAGNOSIS — Z79899 Other long term (current) drug therapy: Secondary | ICD-10-CM | POA: Insufficient documentation

## 2013-07-15 DIAGNOSIS — Z860100 Personal history of colon polyps, unspecified: Secondary | ICD-10-CM | POA: Insufficient documentation

## 2013-07-15 DIAGNOSIS — G9340 Encephalopathy, unspecified: Secondary | ICD-10-CM

## 2013-07-15 DIAGNOSIS — Z8659 Personal history of other mental and behavioral disorders: Secondary | ICD-10-CM | POA: Insufficient documentation

## 2013-07-15 LAB — COMPREHENSIVE METABOLIC PANEL
ALK PHOS: 67 U/L (ref 39–117)
ALT: 8 U/L (ref 0–53)
ANION GAP: 12 (ref 5–15)
AST: 14 U/L (ref 0–37)
Albumin: 3.6 g/dL (ref 3.5–5.2)
BUN: 22 mg/dL (ref 6–23)
CALCIUM: 9.6 mg/dL (ref 8.4–10.5)
CO2: 28 meq/L (ref 19–32)
Chloride: 104 mEq/L (ref 96–112)
Creatinine, Ser: 1.47 mg/dL — ABNORMAL HIGH (ref 0.50–1.35)
GFR calc Af Amer: 55 mL/min — ABNORMAL LOW (ref >90)
GFR calc non Af Amer: 48 mL/min — ABNORMAL LOW (ref >90)
Glucose, Bld: 155 mg/dL — ABNORMAL HIGH (ref 70–99)
Potassium: 4 mEq/L (ref 3.7–5.3)
Sodium: 144 mEq/L (ref 137–147)
TOTAL PROTEIN: 7.4 g/dL (ref 6.0–8.3)
Total Bilirubin: 0.4 mg/dL (ref 0.3–1.2)

## 2013-07-15 LAB — CBC WITH DIFFERENTIAL/PLATELET
Basophils Absolute: 0 10*3/uL (ref 0.0–0.1)
Basophils Relative: 0 % (ref 0–1)
EOS ABS: 0.1 10*3/uL (ref 0.0–0.7)
EOS PCT: 3 % (ref 0–5)
HEMATOCRIT: 34.2 % — AB (ref 39.0–52.0)
HEMOGLOBIN: 11 g/dL — AB (ref 13.0–17.0)
LYMPHS ABS: 1.1 10*3/uL (ref 0.7–4.0)
Lymphocytes Relative: 20 % (ref 12–46)
MCH: 27.9 pg (ref 26.0–34.0)
MCHC: 32.2 g/dL (ref 30.0–36.0)
MCV: 86.8 fL (ref 78.0–100.0)
MONO ABS: 0.6 10*3/uL (ref 0.1–1.0)
Monocytes Relative: 12 % (ref 3–12)
Neutro Abs: 3.6 10*3/uL (ref 1.7–7.7)
Neutrophils Relative %: 65 % (ref 43–77)
Platelets: 135 10*3/uL — ABNORMAL LOW (ref 150–400)
RBC: 3.94 MIL/uL — AB (ref 4.22–5.81)
RDW: 14.3 % (ref 11.5–15.5)
WBC: 5.5 10*3/uL (ref 4.0–10.5)

## 2013-07-15 LAB — URINALYSIS, ROUTINE W REFLEX MICROSCOPIC
BILIRUBIN URINE: NEGATIVE
Glucose, UA: NEGATIVE mg/dL
Hgb urine dipstick: NEGATIVE
Ketones, ur: NEGATIVE mg/dL
Leukocytes, UA: NEGATIVE
NITRITE: NEGATIVE
Protein, ur: NEGATIVE mg/dL
SPECIFIC GRAVITY, URINE: 1.018 (ref 1.005–1.030)
UROBILINOGEN UA: 0.2 mg/dL (ref 0.0–1.0)
pH: 5.5 (ref 5.0–8.0)

## 2013-07-15 LAB — I-STAT VENOUS BLOOD GAS, ED
Acid-Base Excess: 4 mmol/L — ABNORMAL HIGH (ref 0.0–2.0)
Bicarbonate: 30.6 mEq/L — ABNORMAL HIGH (ref 20.0–24.0)
O2 SAT: 26 %
PCO2 VEN: 55.8 mmHg — AB (ref 45.0–50.0)
TCO2: 32 mmol/L (ref 0–100)
pH, Ven: 7.347 — ABNORMAL HIGH (ref 7.250–7.300)
pO2, Ven: 19 mmHg — CL (ref 30.0–45.0)

## 2013-07-15 LAB — CBG MONITORING, ED: Glucose-Capillary: 94 mg/dL (ref 70–99)

## 2013-07-15 LAB — I-STAT TROPONIN, ED: Troponin i, poc: 0 ng/mL (ref 0.00–0.08)

## 2013-07-15 MED ORDER — SODIUM CHLORIDE 0.9 % IV BOLUS (SEPSIS)
500.0000 mL | Freq: Once | INTRAVENOUS | Status: AC
  Administered 2013-07-15: 500 mL via INTRAVENOUS

## 2013-07-15 NOTE — ED Notes (Signed)
Wife at the bedside reporting that patient has started taking sleeping pills, and is unsure of how much he took.

## 2013-07-15 NOTE — ED Provider Notes (Signed)
CSN: [id number]     Arrival date & time 07/15/13  0539 History   First MD Initiated Contact with Patient 07/15/13 [phone]     Chief Complaint  Patient presents with  . Weakness  . Loss of Consciousness     (Consider location/radiation/quality/duration/timing/severity/associated sxs/prior Treatment) HPI Comments: Patient presents to the ER for evaluation of generalized weakness and syncope. Wife and patient report that he has been very weak for the last 7 days. He has been feeling constipated and has had very poor appetite. Around 4 AM he fell and hit his head on the wall, found by his wife lying on the ground.   Patient is a 68 y.o. male presenting with weakness and syncope.  Weakness  Loss of Consciousness Associated symptoms: confusion and weakness     Past Medical History  Diagnosis Date  . Depression   . Hypertension   . Diabetes mellitus without complication     controlled by diet  . Fatigue   . DOE (dyspnea on exertion)     Echo 04 /22/2014 - normal EF, mild LVH, grade 1 Diastolic Dysfunction; Lexiscan Myoview 04/28/12 - no ishcemia or infarction, LBBB septal wall motion; EF ~60%  . Bilateral edema of lower extremity     LE Dopplers 4/22 - no thrombus or thrombophelbitis, no reflux  . Scrotal edema   . Osteoarthritis     with bilateral hip surgeries and multiple complication, with the most recent  surgery in January 2012  . H/O iron deficiency anemia     with  hemmorrhoids  . Anxiety   . Left bundle branch block      chronic, negative Myoview a normal echo as noted above   . Hyperlipidemia   . GERD (gastroesophageal reflux disease)   . DJD (degenerative joint disease)   . DDD (degenerative disc disease)   . Vitamin D deficiency   . Colon polyp    Past Surgical History  Procedure Laterality Date  . Hip surgery  2004  . Cardiology nuclear med study   04 24 2014    overall impression ; NORMAL  STRESS  NUCLEAR STUDY  . Venous duplex  04 22 2014    LOWER EXTREMITY  SWELLING -Impressions [name] ; This is a normal bilateral  lower extremity venous duplex Doppler evaluation   Family History  Problem Relation Age of Onset  . Dementia Mother   . Hypertension Mother   . Heart disease Mother   . Diabetes Mother   . Heart attack Maternal Grandmother   . Diabetes Maternal Grandfather    History  Substance Use Topics  . Smoking status: Former Smoker -- 0.50 packs/day for 10 years    Quit date: 03/30/1983  . Smokeless tobacco: Never Used  . Alcohol Use: 0.5 oz/week    1 drink(s) per week     Comment: couple times a year    Review of Systems  Cardiovascular: Positive for syncope.  Neurological: Positive for weakness.  Psychiatric/Behavioral: Positive for confusion.  All other systems reviewed and are negative.     Allergies  Review of patient's allergies indicates no known allergies.  Home Medications   Prior to Admission medications   Medication Sig Start Date End Date Taking? Authorizing Provider  ALPRAZolam Duanne Moron) 1 MG tablet Take 1/2 to 1 tab tid prn anxiety 05/31/13  Yes Unk Pinto, MD  aspirin 81 MG tablet Take 81 mg by mouth daily.   Yes Historical Provider, MD  bisoprolol-hydrochlorothiazide (ZIAC) 5-6.25 MG per tablet  Take 0.5 tablets by mouth daily. 01/12/13  Yes Unk Pinto, MD  bu[facility] SR) 150 MG 12 hr tablet Take 150 mg by mouth 2 (two) times daily.   Yes Historical Provider, MD  Cholecalciferol (VITAMIN D3) 5000 UNITS CAPS Take 5,000 Units by mouth daily.    Yes Historical Provider, MD  doxazosin (CARDURA) 8 MG tablet Take 1 tablet (8 mg total) by mouth daily. 03/24/13  Yes Unk Pinto, MD  enalapril (VASOTEC) 20 MG tablet Take 20 mg by mouth 2 (two) times daily.   Yes Historical Provider, MD  Ferrous Sulfate (IRON) 325 (65 FE) MG TABS Take 65 mg by mouth daily.    Yes Historical Provider, MD  FLUoxetine (PROZAC) 20 MG capsule Take 1 capsule (20 mg total) by mouth daily. 05/05/13  Yes Unk Pinto, MD   furosemide (LASIX) 40 MG tablet Take 1 tablet (40 mg total) by mouth 2 (two) times daily. 03/24/13  Yes Unk Pinto, MD  HYDROcodone-acetaminophen (NORCO/VICODIN) 5-325 MG per tablet Take 1 tablet by mouth every 6 (six) hours as needed for moderate pain.   Yes Historical Provider, MD  Magnesium 250 MG TABS Take 1 tablet by mouth daily.    Yes Historical Provider, MD  Melatonin 5 MG SUBL Place 5 mg under the tongue at bedtime as needed (sleep).    Yes Historical Provider, MD  meloxicam (MOBIC) 15 MG tablet Take 1 tablet (15 mg total) by mouth daily. 01/12/13  Yes Unk Pinto, MD  nystatin (MYCOSTATIN/NYSTOP) 100000 UNIT/GM POWD Apply 1 g topically 2 (two) times daily as needed (rash).   Yes Historical Provider, MD  omeprazole (PRILOSEC) 40 MG capsule Take 1 capsule (40 mg total) by mouth daily. 01/12/13  Yes Unk Pinto, MD  polyethylene glycol [facility] / GLYCOLAX) packet Take 17 g by mouth daily.    Yes Historical Provider, MD  QUEtiapine (SEROQUEL) 100 MG tablet Take 1 tablet (100 mg total) by mouth at bedtime. 06/24/13 06/25/14 Yes Unk Pinto, MD  ranitidine (ZANTAC) 300 MG tablet Take 1 tablet (300 mg total) by mouth at bedtime. 05/20/13 05/20/14 Yes Unk Pinto, MD  Sennosides (EX-LAX PO) Take 1 tablet by mouth daily as needed (constipation).   Yes Historical Provider, MD  traZODone (DESYREL) 150 MG tablet Take 1 tablet (150 mg total) by mouth at bedtime. For sleep 06/24/13 06/25/14 Yes Unk Pinto, MD  vitamin B-12 (CYANOCOBALAMIN) 1000 MCG tablet Take 1,000 mcg by mouth daily.   Yes Historical Provider, MD  vitamin C (ASCORBIC ACID) 500 MG tablet Take 500 mg by mouth 2 (two) times daily.    Yes Historical Provider, MD   BP 168/72  Pulse 66  Temp(Src) 98.2 F (36.8 C) (Oral)  Resp 10  Ht 5\' 7"  (1.702 m)  Wt 200 lb (90.719 kg)  BMI 31.32 kg/m2  SpO2 100% Physical Exam  Constitutional: He is oriented to person, place, and time. He appears well-developed and well-nourished.  No distress.  HENT:  Head: Normocephalic and atraumatic.  Right Ear: Hearing normal.  Left Ear: Hearing normal.  Nose: Nose normal.  Mouth/Throat: Oropharynx is clear and moist and mucous membranes are normal.  Eyes: Conjunctivae and EOM are normal.  Pupils are small, sluggishly reactive, but equal bilaterally  Neck: Normal range of motion. Neck supple.  Cardiovascular: Regular rhythm, S1 normal and S2 normal.  Exam reveals no gallop and no friction rub.   No murmur heard. Pulmonary/Chest: Effort normal and breath sounds normal. No respiratory distress. He exhibits no tenderness.  Abdominal: Soft. Normal appearance and bowel sounds are normal. There is no hepatosplenomegaly. There is no tenderness. There is no rebound, no guarding, no tenderness at McBurney's point and negative Murphy's sign. No hernia.  Musculoskeletal: Normal range of motion.  Neurological: He is alert and oriented to person, place, and time. He has normal strength. No cranial nerve deficit or sensory deficit. Coordination normal. GCS eye subscore is 4. GCS verbal subscore is 5. GCS motor subscore is 6.  Skin: Skin is warm, dry and intact. No rash noted. No cyanosis.  Psychiatric: He has a normal mood and affect. His speech is normal and behavior is normal. Thought content normal.    ED Course  Procedures (including critical care time) Labs Review Labs Reviewed  CBC WITH DIFFERENTIAL - Abnormal; Notable for the following:    RBC 3.94 (*)    Hemoglobin 11.0 (*)    HCT 34.2 (*)    Platelets 135 (*)    All other components within normal limits  COMPREHENSIVE METABOLIC PANEL - Abnormal; Notable for the following:    Glucose, Bld 155 (*)    Creatinine, Ser 1.47 (*)    GFR calc non Af Amer 48 (*)    GFR calc Af Amer 55 (*)    All other components within normal limits  I-STAT VENOUS BLOOD GAS, ED - Abnormal; Notable for the following:    pH, Ven 7.347 (*)    pCO2, Ven 55.8 (*)    pO2, Ven 19.0 (*)    Bicarbonate  30.6 (*)    Acid-Base Excess 4.0 (*)    All other components within normal limits  URINALYSIS, ROUTINE W REFLEX MICROSCOPIC  CBG MONITORING, ED  I-STAT TROPOININ, ED    Imaging Review Ct Head Wo Contrast  07/15/2013   CLINICAL DATA:  Recent traumatic injury, weakness, slurred speech  EXAM: CT HEAD WITHOUT CONTRAST  CT CERVICAL SPINE WITHOUT CONTRAST  TECHNIQUE: Multidetector CT imaging of the head and cervical spine was performed following the standard protocol without intravenous contrast. Multiplanar CT image reconstructions of the cervical spine were also generated.  COMPARISON:  03/19/2012  FINDINGS: CT HEAD FINDINGS  The bony calvarium is intact. No gross soft tissue abnormality is noted. Diffuse atrophic changes are identified. Mild chronic white matter ischemic change is seen. No findings to suggest acute hemorrhage, acute infarction or space-occupying mass lesion is identified.  CT CERVICAL SPINE FINDINGS  Seven cervical segments are well visualized. Osteophytic changes are noted all levels from C2-C7. Mild facet hypertrophic changes are seen. No acute fracture or acute facet abnormality is noted. Scattered carotid calcifications are noted. No acute soft tissue abnormality is seen.  IMPRESSION: CT of the head: Chronic changes without acute abnormality.  CT of the cervical spine: Degenerative change without acute abnormality.   Electronically Signed   By: Inez Catalina M.D.   On: 07/15/2013 07:53   Ct Cervical Spine Wo Contrast  07/15/2013   CLINICAL DATA:  Recent traumatic injury, weakness, slurred speech  EXAM: CT HEAD WITHOUT CONTRAST  CT CERVICAL SPINE WITHOUT CONTRAST  TECHNIQUE: Multidetector CT imaging of the head and cervical spine was performed following the standard protocol without intravenous contrast. Multiplanar CT image reconstructions of the cervical spine were also generated.  COMPARISON:  03/19/2012  FINDINGS: CT HEAD FINDINGS  The bony calvarium is intact. No gross soft tissue  abnormality is noted. Diffuse atrophic changes are identified. Mild chronic white matter ischemic change is seen. No findings to suggest acute hemorrhage, acute infarction or  space-occupying mass lesion is identified.  CT CERVICAL SPINE FINDINGS  Seven cervical segments are well visualized. Osteophytic changes are noted all levels from C2-C7. Mild facet hypertrophic changes are seen. No acute fracture or acute facet abnormality is noted. Scattered carotid calcifications are noted. No acute soft tissue abnormality is seen.  IMPRESSION: CT of the head: Chronic changes without acute abnormality.  CT of the cervical spine: Degenerative change without acute abnormality.   Electronically Signed   By: Inez Catalina M.D.   On: 07/15/2013 07:53   Dg Chest Port 1 View  07/15/2013   CLINICAL DATA:  Syncopal episode.  EXAM: PORTABLE CHEST - 1 VIEW  COMPARISON:  Chest radiograph March 29, 2013  FINDINGS: The cardiac silhouette is upper limits of normal in size, mediastinal silhouette is unremarkable. Minimal left lung base atelectasis versus scarring. The lungs are otherwise clear without pleural effusions or focal consolidations. Trachea projects midline and there is no pneumothorax. Soft tissue planes and included osseous structures are non-suspicious. Moderate degenerative change of thoracic spine.  IMPRESSION: Minimal left lung base atelectasis versus scarring.  Borderline cardiomegaly.   Electronically Signed   By: Elon Alas   On: 07/15/2013 06:42     EKG Interpretation   Date/Time:  Friday July 15 2013 06:07:56 EDT Ventricular Rate:  63 PR Interval:  184 QRS Duration: 177 QT Interval:  511 QTC Calculation: 523 R Axis:   -3 Text Interpretation:  Age not entered, assumed to be  68 years old for  purpose of ECG interpretation Sinus rhythm Left bundle branch block  Baseline wander in lead(s) V6 LBBB new since 2012 Confirmed by Astor Gentle   MD, [place] [phone]) on 07/15/2013 7:02:44 AM      MDM    Final diagnoses:  Weakness  Acute encephalopathy   Patient presents after a fall. He reports that for the last 1-1/2 years he has been having difficulty sleeping at night. He usually sleeps all day. His doctor has been giving him medications to help him sleep at night which she does not take until morning time. He had a fall this morning at 4 AM. He denies any complaints from the fall. CT head and cervical spine were negative. Remainder of the workup was unremarkable. Patient be oversedated. He was for an extended period of time here in the ER has progressively improved. He is now more awake and alert. I don't have a conversation with the wife and she suspects that he has been offered today to buy his medications. He has also been suspected of early dementia. Wife will bring the patient back to the primary care doctor's office in the morning and try to arrange for psychiatry evaluation for further evaluation of his insomnia and dementia. Until then, minimize sedating medications.    Orpah Greek, MD 07/15/13 [phone]

## 2013-07-15 NOTE — Discharge Instructions (Signed)
No Xanax today. Followup with primary doctor Monday.  Fatigue Fatigue is a feeling of tiredness, lack of energy, lack of motivation, or feeling tired all the time. Having enough rest, good nutrition, and reducing stress will normally reduce fatigue. Consult your caregiver if it persists. The nature of your fatigue will help your caregiver to find out its cause. The treatment is based on the cause.  CAUSES  There are many causes for fatigue. Most of the time, fatigue can be traced to one or more of your habits or routines. Most causes fit into one or more of three general areas. They are: Lifestyle problems  Sleep disturbances.  Overwork.  Physical exertion.  Unhealthy habits.  Poor eating habits or eating disorders.  Alcohol and/or drug use .  Lack of proper nutrition (malnutrition). Psychological problems  Stress and/or anxiety problems.  Depression.  Grief.  Boredom. Medical Problems or Conditions  Anemia.  Pregnancy.  Thyroid gland problems.  Recovery from major surgery.  Continuous pain.  Emphysema or asthma that is not well controlled  Allergic conditions.  Diabetes.  Infections (such as mononucleosis).  Obesity.  Sleep disorders, such as sleep apnea.  Heart failure or other heart-related problems.  Cancer.  Kidney disease.  Liver disease.  Effects of certain medicines such as antihistamines, cough and cold remedies, prescription pain medicines, heart and blood pressure medicines, drugs used for treatment of cancer, and some antidepressants. SYMPTOMS  The symptoms of fatigue include:   Lack of energy.  Lack of drive (motivation).  Drowsiness.  Feeling of indifference to the surroundings. DIAGNOSIS  The details of how you feel help guide your caregiver in finding out what is causing the fatigue. You will be asked about your present and past health condition. It is important to review all medicines that you take, including prescription and  non-prescription items. A thorough exam will be done. You will be questioned about your feelings, habits, and normal lifestyle. Your caregiver may suggest blood tests, urine tests, or other tests to look for common medical causes of fatigue.  TREATMENT  Fatigue is treated by correcting the underlying cause. For example, if you have continuous pain or depression, treating these causes will improve how you feel. Similarly, adjusting the dose of certain medicines will help in reducing fatigue.  HOME CARE INSTRUCTIONS   Try to get the required amount of good sleep every night.  Eat a healthy and nutritious diet, and drink enough water throughout the day.  Practice ways of relaxing (including yoga or meditation).  Exercise regularly.  Make plans to change situations that cause stress. Act on those plans so that stresses decrease over time. Keep your work and personal routine reasonable.  Avoid street drugs and minimize use of alcohol.  Start taking a daily multivitamin after consulting your caregiver. SEEK MEDICAL CARE IF:   You have persistent tiredness, which cannot be accounted for.  You have fever.  You have unintentional weight loss.  You have headaches.  You have disturbed sleep throughout the night.  You are feeling sad.  You have constipation.  You have dry skin.  You have gained weight.  You are taking any new or different medicines that you suspect are causing fatigue.  You are unable to sleep at night.  You develop any unusual swelling of your legs or other parts of your body. SEEK IMMEDIATE MEDICAL CARE IF:   You are feeling confused.  Your vision is blurred.  You feel faint or pass out.  You develop  severe headache.  You develop severe abdominal, pelvic, or back pain.  You develop chest pain, shortness of breath, or an irregular or fast heartbeat.  You are unable to pass a normal amount of urine.  You develop abnormal bleeding such as bleeding from  the rectum or you vomit blood.  You have thoughts about harming yourself or committing suicide.  You are worried that you might harm someone else. MAKE SURE YOU:   Understand these instructions.  Will watch your condition.  Will get help right away if you are not doing well or get worse. Document Released: 10/20/2006 Document Revised: 03/17/2011 Document Reviewed: 10/20/2006 [facility] Patient Information 2015 [place], [place]. This information is not intended to replace advice given to you by your health care provider. Make sure you discuss any questions you have with your health care provider.
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
## 2013-09-28 ENCOUNTER — Ambulatory Visit: Payer: Self-pay | Admitting: Physician Assistant

## 2013-11-25 ENCOUNTER — Encounter: Payer: Self-pay | Admitting: *Deleted

## 2014-01-11 ENCOUNTER — Ambulatory Visit: Payer: Self-pay | Admitting: Internal Medicine

## 2014-01-18 ENCOUNTER — Other Ambulatory Visit: Payer: Self-pay | Admitting: Internal Medicine

## 2014-02-22 ENCOUNTER — Other Ambulatory Visit: Payer: Self-pay | Admitting: Internal Medicine

## 2014-04-11 DIAGNOSIS — E538 Deficiency of other specified B group vitamins: Secondary | ICD-10-CM | POA: Diagnosis not present

## 2014-04-11 DIAGNOSIS — I10 Essential (primary) hypertension: Secondary | ICD-10-CM | POA: Diagnosis not present

## 2014-04-11 DIAGNOSIS — E559 Vitamin D deficiency, unspecified: Secondary | ICD-10-CM | POA: Diagnosis not present

## 2014-04-11 DIAGNOSIS — E1121 Type 2 diabetes mellitus with diabetic nephropathy: Secondary | ICD-10-CM | POA: Diagnosis not present

## 2014-04-18 ENCOUNTER — Other Ambulatory Visit: Payer: Self-pay

## 2014-04-18 DIAGNOSIS — E538 Deficiency of other specified B group vitamins: Secondary | ICD-10-CM | POA: Diagnosis not present

## 2014-04-18 DIAGNOSIS — E1122 Type 2 diabetes mellitus with diabetic chronic kidney disease: Secondary | ICD-10-CM | POA: Diagnosis not present

## 2014-04-18 DIAGNOSIS — N400 Enlarged prostate without lower urinary tract symptoms: Secondary | ICD-10-CM | POA: Diagnosis not present

## 2014-04-18 DIAGNOSIS — E785 Hyperlipidemia, unspecified: Secondary | ICD-10-CM | POA: Diagnosis not present

## 2014-04-18 DIAGNOSIS — E559 Vitamin D deficiency, unspecified: Secondary | ICD-10-CM | POA: Diagnosis not present

## 2014-04-18 DIAGNOSIS — M160 Bilateral primary osteoarthritis of hip: Secondary | ICD-10-CM | POA: Diagnosis not present

## 2014-04-18 DIAGNOSIS — I5032 Chronic diastolic (congestive) heart failure: Secondary | ICD-10-CM | POA: Diagnosis not present

## 2014-04-18 DIAGNOSIS — I10 Essential (primary) hypertension: Secondary | ICD-10-CM | POA: Diagnosis not present

## 2014-04-18 NOTE — Patient Outreach (Signed)
[facility]) Care Management  04/18/2014  Gerald [name] 17-Jan-1945 [id number]  Telephone call to patient regarding [facility] delegation outreach.  Unable to reach patient.  HIPAA compliant voice message left with patients wife with return phone number.   Plan:  RNCM will attempt 2nd telephone outreach to patient within 1 week.

## 2014-04-19 ENCOUNTER — Ambulatory Visit: Payer: Commercial Managed Care - HMO

## 2014-04-19 ENCOUNTER — Other Ambulatory Visit: Payer: Self-pay

## 2014-04-19 NOTE — Patient Outreach (Signed)
[facility]) Care Management  04/19/2014  DAMYEN KNOLL [birth year] [id number]   Second telephone outreach for [facility] delegation referral.  Unable to reach patient or leave voice mail due to phone continuing to ring.    Plan: RNCM will attempt third telephone outreach within one week.   Quinn Plowman RN,BSN,CCM [place] Coordinator [phone]

## 2014-04-20 ENCOUNTER — Ambulatory Visit: Payer: Commercial Managed Care - HMO

## 2014-04-20 ENCOUNTER — Other Ambulatory Visit: Payer: Self-pay

## 2014-04-20 ENCOUNTER — Other Ambulatory Visit (HOSPITAL_COMMUNITY): Payer: Self-pay | Admitting: Internal Medicine

## 2014-04-20 DIAGNOSIS — R011 Cardiac murmur, unspecified: Secondary | ICD-10-CM

## 2014-04-20 NOTE — Patient Outreach (Signed)
[facility]) Care Management  04/20/2014  Gerald [name] [birth year] [id number]   Third telephone outreach to patient regarding [facility] delegation referral.  Unable to reach patient.  HIPAA compliant voice message left with call back phone number.    PLAN: RNCM will send patient outreach letter to attempt contact.   Quinn Plowman RN,BSN,CCM [place] Coordinator [phone]

## 2014-05-04 ENCOUNTER — Ambulatory Visit (HOSPITAL_COMMUNITY)
Admission: RE | Admit: 2014-05-04 | Discharge: 2014-05-04 | Disposition: A | Discharge status: Home / Self Care | Payer: Commercial Managed Care - HMO | Source: Ambulatory Visit | Attending: Cardiology | Admitting: Cardiology

## 2014-05-04 DIAGNOSIS — R011 Cardiac murmur, unspecified: Secondary | ICD-10-CM

## 2014-05-04 DIAGNOSIS — R010 Benign and innocent cardiac murmurs: Secondary | ICD-10-CM | POA: Diagnosis not present

## 2014-05-04 NOTE — Progress Notes (Signed)
2D Echo Performed 05/04/2014    Marygrace Drought, RCS

## 2014-05-05 NOTE — Patient Outreach (Signed)
[facility]) Care Management  05/05/2014  KENYETTA FIFE [birth year] [id number]   No response from patient after 3 telephone attempts and outreach letter.    PLAN:  RNCM will refer patient to [facility] to close due to inability to establish contact.   RNCM will notify patients primary care provider of inability to establish contact.   Quinn Plowman RN,BSN,CCM [place] Coordinator [phone]

## 2014-05-25 DIAGNOSIS — H40023 Open angle with borderline findings, high risk, bilateral: Secondary | ICD-10-CM | POA: Diagnosis not present

## 2014-06-29 ENCOUNTER — Encounter: Payer: Self-pay | Admitting: Internal Medicine

## 2014-07-11 DIAGNOSIS — E538 Deficiency of other specified B group vitamins: Secondary | ICD-10-CM | POA: Diagnosis not present

## 2014-07-11 DIAGNOSIS — E559 Vitamin D deficiency, unspecified: Secondary | ICD-10-CM | POA: Diagnosis not present

## 2014-07-11 DIAGNOSIS — I10 Essential (primary) hypertension: Secondary | ICD-10-CM | POA: Diagnosis not present

## 2014-07-11 DIAGNOSIS — E1122 Type 2 diabetes mellitus with diabetic chronic kidney disease: Secondary | ICD-10-CM | POA: Diagnosis not present

## 2014-07-18 DIAGNOSIS — E559 Vitamin D deficiency, unspecified: Secondary | ICD-10-CM | POA: Diagnosis not present

## 2014-07-18 DIAGNOSIS — N183 Chronic kidney disease, stage 3 (moderate): Secondary | ICD-10-CM | POA: Diagnosis not present

## 2014-07-18 DIAGNOSIS — E1122 Type 2 diabetes mellitus with diabetic chronic kidney disease: Secondary | ICD-10-CM | POA: Diagnosis not present

## 2014-07-18 DIAGNOSIS — I5032 Chronic diastolic (congestive) heart failure: Secondary | ICD-10-CM | POA: Diagnosis not present

## 2014-07-18 DIAGNOSIS — F17211 Nicotine dependence, cigarettes, in remission: Secondary | ICD-10-CM | POA: Diagnosis not present

## 2014-07-18 DIAGNOSIS — I129 Hypertensive chronic kidney disease with stage 1 through stage 4 chronic kidney disease, or unspecified chronic kidney disease: Secondary | ICD-10-CM | POA: Diagnosis not present

## 2014-07-18 DIAGNOSIS — D696 Thrombocytopenia, unspecified: Secondary | ICD-10-CM | POA: Diagnosis not present

## 2014-07-18 DIAGNOSIS — F339 Major depressive disorder, recurrent, unspecified: Secondary | ICD-10-CM | POA: Diagnosis not present

## 2014-08-14 ENCOUNTER — Other Ambulatory Visit: Payer: Self-pay | Admitting: Internal Medicine

## 2014-09-05 ENCOUNTER — Other Ambulatory Visit: Payer: Self-pay | Admitting: Internal Medicine

## 2014-09-25 ENCOUNTER — Encounter (HOSPITAL_COMMUNITY): Payer: Self-pay | Admitting: Emergency Medicine

## 2014-09-25 ENCOUNTER — Emergency Department (HOSPITAL_COMMUNITY)
Admission: EM | Admit: 2014-09-25 | Discharge: 2014-09-29 | Disposition: A | Discharge status: Home / Self Care | Payer: Commercial Managed Care - HMO | Attending: Emergency Medicine | Admitting: Emergency Medicine

## 2014-09-25 DIAGNOSIS — E119 Type 2 diabetes mellitus without complications: Secondary | ICD-10-CM | POA: Insufficient documentation

## 2014-09-25 DIAGNOSIS — R451 Restlessness and agitation: Secondary | ICD-10-CM

## 2014-09-25 DIAGNOSIS — F329 Major depressive disorder, single episode, unspecified: Secondary | ICD-10-CM | POA: Diagnosis not present

## 2014-09-25 DIAGNOSIS — Z86010 Personal history of colonic polyps: Secondary | ICD-10-CM | POA: Diagnosis not present

## 2014-09-25 DIAGNOSIS — F6381 Intermittent explosive disorder: Secondary | ICD-10-CM

## 2014-09-25 DIAGNOSIS — Z046 Encounter for general psychiatric examination, requested by authority: Secondary | ICD-10-CM | POA: Diagnosis present

## 2014-09-25 DIAGNOSIS — M1990 Unspecified osteoarthritis, unspecified site: Secondary | ICD-10-CM | POA: Insufficient documentation

## 2014-09-25 DIAGNOSIS — F419 Anxiety disorder, unspecified: Secondary | ICD-10-CM | POA: Diagnosis not present

## 2014-09-25 DIAGNOSIS — K219 Gastro-esophageal reflux disease without esophagitis: Secondary | ICD-10-CM | POA: Insufficient documentation

## 2014-09-25 DIAGNOSIS — G4700 Insomnia, unspecified: Secondary | ICD-10-CM | POA: Insufficient documentation

## 2014-09-25 DIAGNOSIS — F1310 Sedative, hypnotic or anxiolytic abuse, uncomplicated: Secondary | ICD-10-CM | POA: Diagnosis not present

## 2014-09-25 DIAGNOSIS — Z791 Long term (current) use of non-steroidal anti-inflammatories (NSAID): Secondary | ICD-10-CM | POA: Insufficient documentation

## 2014-09-25 DIAGNOSIS — E785 Hyperlipidemia, unspecified: Secondary | ICD-10-CM | POA: Diagnosis not present

## 2014-09-25 DIAGNOSIS — R51 Headache: Secondary | ICD-10-CM | POA: Insufficient documentation

## 2014-09-25 DIAGNOSIS — D649 Anemia, unspecified: Secondary | ICD-10-CM | POA: Diagnosis not present

## 2014-09-25 DIAGNOSIS — Z7982 Long term (current) use of aspirin: Secondary | ICD-10-CM | POA: Insufficient documentation

## 2014-09-25 DIAGNOSIS — F4325 Adjustment disorder with mixed disturbance of emotions and conduct: Secondary | ICD-10-CM | POA: Diagnosis present

## 2014-09-25 DIAGNOSIS — E559 Vitamin D deficiency, unspecified: Secondary | ICD-10-CM | POA: Diagnosis not present

## 2014-09-25 DIAGNOSIS — Z79899 Other long term (current) drug therapy: Secondary | ICD-10-CM | POA: Insufficient documentation

## 2014-09-25 DIAGNOSIS — I10 Essential (primary) hypertension: Secondary | ICD-10-CM | POA: Insufficient documentation

## 2014-09-25 LAB — CBC
HCT: 36.2 % — ABNORMAL LOW (ref 39.0–52.0)
Hemoglobin: 11.7 g/dL — ABNORMAL LOW (ref 13.0–17.0)
MCH: 28.8 pg (ref 26.0–34.0)
MCHC: 32.3 g/dL (ref 30.0–36.0)
MCV: 89.2 fL (ref 78.0–100.0)
Platelets: 142 10*3/uL — ABNORMAL LOW (ref 150–400)
RBC: 4.06 MIL/uL — ABNORMAL LOW (ref 4.22–5.81)
RDW: 15.1 % (ref 11.5–15.5)
WBC: 7 10*3/uL (ref 4.0–10.5)

## 2014-09-25 LAB — RAPID URINE DRUG SCREEN, HOSP PERFORMED
AMPHETAMINES: NOT DETECTED
BENZODIAZEPINES: POSITIVE — AB
Barbiturates: NOT DETECTED
COCAINE: NOT DETECTED
Opiates: NOT DETECTED
Tetrahydrocannabinol: NOT DETECTED

## 2014-09-25 LAB — COMPREHENSIVE METABOLIC PANEL
ALT: 12 U/L — ABNORMAL LOW (ref 17–63)
ANION GAP: 10 (ref 5–15)
AST: 29 U/L (ref 15–41)
Albumin: 4.1 g/dL (ref 3.5–5.0)
Alkaline Phosphatase: 72 U/L (ref 38–126)
BUN: 17 mg/dL (ref 6–20)
CHLORIDE: 105 mmol/L (ref 101–111)
CO2: 27 mmol/L (ref 22–32)
Calcium: 9.2 mg/dL (ref 8.9–10.3)
Creatinine, Ser: 1.27 mg/dL — ABNORMAL HIGH (ref 0.61–1.24)
GFR calc Af Amer: >60 mL/min (ref >60)
GFR, EST NON AFRICAN AMERICAN: 56 mL/min — AB (ref >60)
Glucose, Bld: 144 mg/dL — ABNORMAL HIGH (ref 65–99)
POTASSIUM: 3.2 mmol/L — AB (ref 3.5–5.1)
Sodium: 142 mmol/L (ref 135–145)
Total Bilirubin: 0.6 mg/dL (ref 0.3–1.2)
Total Protein: 7.8 g/dL (ref 6.5–8.1)

## 2014-09-25 LAB — SALICYLATE LEVEL: Salicylate Lvl: <4 mg/dL (ref 2.8–30.0)

## 2014-09-25 LAB — ACETAMINOPHEN LEVEL

## 2014-09-25 LAB — ETHANOL

## 2014-09-25 MED ORDER — HYDROCODONE-ACETAMINOPHEN 5-325 MG PO TABS: 1.0000 | ORAL_TABLET | Freq: Four times a day (QID) | ORAL | Status: DC | PRN

## 2014-09-25 MED ORDER — VITAMIN C 500 MG PO TABS
500.0000 mg | ORAL_TABLET | Freq: Two times a day (BID) | ORAL | Status: DC
  Administered 2014-09-25 – 2014-09-29 (×9): 500 mg via ORAL
  Filled 2014-09-25 (×12): qty 1

## 2014-09-25 MED ORDER — QUETIAPINE FUMARATE 100 MG PO TABS
100.0000 mg | ORAL_TABLET | Freq: Every day | ORAL | Status: DC
  Administered 2014-09-25 – 2014-09-28 (×4): 100 mg via ORAL
  Filled 2014-09-25 (×4): qty 1

## 2014-09-25 MED ORDER — VITAMIN D3 25 MCG (1000 UNIT) PO TABS
5000.0000 [IU] | ORAL_TABLET | Freq: Every day | ORAL | Status: DC
  Administered 2014-09-25 – 2014-09-29 (×5): 5000 [IU] via ORAL
  Filled 2014-09-25 (×6): qty 5

## 2014-09-25 MED ORDER — MAGNESIUM OXIDE 400 (241.3 MG) MG PO TABS
400.0000 mg | ORAL_TABLET | Freq: Every day | ORAL | Status: DC
  Administered 2014-09-25 – 2014-09-29 (×5): 400 mg via ORAL
  Filled 2014-09-25 (×6): qty 1

## 2014-09-25 MED ORDER — FUROSEMIDE 40 MG PO TABS
40.0000 mg | ORAL_TABLET | Freq: Two times a day (BID) | ORAL | Status: DC
Start: 2014-09-25 — End: 2014-09-29
  Administered 2014-09-25 – 2014-09-29 (×8): 40 mg via ORAL
  Filled 2014-09-25 (×9): qty 1

## 2014-09-25 MED ORDER — DOXAZOSIN MESYLATE 8 MG PO TABS
8.0000 mg | ORAL_TABLET | Freq: Every day | ORAL | Status: DC
  Administered 2014-09-25 – 2014-09-29 (×5): 8 mg via ORAL
  Filled 2014-09-25 (×6): qty 1

## 2014-09-25 MED ORDER — POLYETHYLENE GLYCOL 3350 17 G PO PACK
17.0000 g | PACK | Freq: Every day | ORAL | Status: DC
  Administered 2014-09-26 – 2014-09-28 (×3): 17 g via ORAL
  Filled 2014-09-25 (×5): qty 1

## 2014-09-25 MED ORDER — FLUOXETINE HCL 10 MG PO CAPS
10.0000 mg | ORAL_CAPSULE | Freq: Every day | ORAL | Status: DC
  Administered 2014-09-25 – 2014-09-27 (×3): 10 mg via ORAL
  Filled 2014-09-25 (×4): qty 1

## 2014-09-25 MED ORDER — ENALAPRIL MALEATE 20 MG PO TABS
20.0000 mg | ORAL_TABLET | Freq: Two times a day (BID) | ORAL | Status: DC
  Administered 2014-09-25 – 2014-09-29 (×9): 20 mg via ORAL
  Filled 2014-09-25 (×12): qty 1

## 2014-09-25 MED ORDER — FLUOXETINE HCL 20 MG PO CAPS: 20.0000 mg | ORAL_CAPSULE | Freq: Every day | ORAL | Status: DC

## 2014-09-25 MED ORDER — VITAMIN B-12 1000 MCG PO TABS
1000.0000 ug | ORAL_TABLET | Freq: Every day | ORAL | Status: DC
Start: 2014-09-25 — End: 2014-09-29
  Administered 2014-09-25 – 2014-09-29 (×5): 1000 ug via ORAL
  Filled 2014-09-25 (×6): qty 1

## 2014-09-25 MED ORDER — PANTOPRAZOLE SODIUM 40 MG PO TBEC
40.0000 mg | DELAYED_RELEASE_TABLET | Freq: Every day | ORAL | Status: DC
  Administered 2014-09-25 – 2014-09-29 (×5): 40 mg via ORAL
  Filled 2014-09-25 (×5): qty 1

## 2014-09-25 MED ORDER — ACETAMINOPHEN 325 MG PO TABS
650.0000 mg | ORAL_TABLET | Freq: Once | ORAL | Status: AC
  Administered 2014-09-25: 650 mg via ORAL
  Filled 2014-09-25: qty 2

## 2014-09-25 MED ORDER — POTASSIUM CHLORIDE CRYS ER 10 MEQ PO TBCR
10.0000 meq | EXTENDED_RELEASE_TABLET | Freq: Every day | ORAL | Status: DC
  Administered 2014-09-25 – 2014-09-29 (×5): 10 meq via ORAL
  Filled 2014-09-25 (×6): qty 1

## 2014-09-25 MED ORDER — MELOXICAM 15 MG PO TABS
15.0000 mg | ORAL_TABLET | Freq: Every day | ORAL | Status: DC
  Administered 2014-09-25 – 2014-09-29 (×5): 15 mg via ORAL
  Filled 2014-09-25 (×6): qty 1

## 2014-09-25 MED ORDER — FERROUS SULFATE 325 (65 FE) MG PO TABS
325.0000 mg | ORAL_TABLET | Freq: Every day | ORAL | Status: DC
Start: 2014-09-25 — End: 2014-09-29
  Administered 2014-09-25 – 2014-09-29 (×5): 325 mg via ORAL
  Filled 2014-09-25 (×6): qty 1

## 2014-09-25 NOTE — ED Notes (Signed)
Pt belonging changed to locker #28
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
## 2014-09-25 NOTE — ED Notes (Signed)
Pt belonging brought to TCU locked in locker #32.   1 black pants with one cell phone and wallet in pocket. Gerald [name] has one $5 bill and $1 bill, one credit card and ID, AAA card,  1 blue shirt, 1 white t-shirt, 1 black belt, 1 white underwear, one pair Docker shoe.

## 2014-09-25 NOTE — ED Notes (Addendum)
Pt is very pleasant and cooperative. He stated he was fine and is not sure why he is here. Pt stated he slept okay last pm. He does contract for safety and denies Si and HI. Pt stated he ambulates with a crutch due to one leg shorter than the other. 9:15a-Pt did take a shower. At times he can be abrupt and would not discuss with the writer why he is here. 9:45a_MD and NP in with the pt. Pt was given back his shoes to wear for his mobility.MD in agreement. Pt ate 100% of his breakfast. Pt c/o headache a 7/10. Pt was given 650mg  of tylenol po.

## 2014-09-25 NOTE — ED Notes (Signed)
Warm blankets provided. Pt more comfortable in bed.
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
## 2014-09-25 NOTE — ED Notes (Addendum)
Pt was seen on his cell phone.  Pt was advised that his cell phone needed to be confiscated previously.  Pt stated he was aware of such but was told he could keep it to communicate with his family.  Pt states he is trying to hold his family together.  Pt was advised that he could use the phone from 9am to 9pm.  Pt was told that he would be able to use his phone to look up numbers.  Phone was placed in his locker.
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
## 2014-09-25 NOTE — BH Assessment (Signed)
Assessment completed. Consulted Gerald Marker, NP recommended that pt be evaluated by psychiatry in the morning. [name], PA-C has been informed of the recommendation.
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
## 2014-09-26 ENCOUNTER — Emergency Department (HOSPITAL_COMMUNITY): Payer: Commercial Managed Care - HMO

## 2014-09-26 DIAGNOSIS — M1990 Unspecified osteoarthritis, unspecified site: Secondary | ICD-10-CM | POA: Diagnosis not present

## 2014-09-26 DIAGNOSIS — F4325 Adjustment disorder with mixed disturbance of emotions and conduct: Secondary | ICD-10-CM | POA: Diagnosis not present

## 2014-09-26 DIAGNOSIS — E785 Hyperlipidemia, unspecified: Secondary | ICD-10-CM | POA: Diagnosis not present

## 2014-09-26 DIAGNOSIS — E119 Type 2 diabetes mellitus without complications: Secondary | ICD-10-CM | POA: Diagnosis not present

## 2014-09-26 DIAGNOSIS — F419 Anxiety disorder, unspecified: Secondary | ICD-10-CM | POA: Diagnosis not present

## 2014-09-26 DIAGNOSIS — E559 Vitamin D deficiency, unspecified: Secondary | ICD-10-CM | POA: Diagnosis not present

## 2014-09-26 DIAGNOSIS — I10 Essential (primary) hypertension: Secondary | ICD-10-CM | POA: Diagnosis not present

## 2014-09-26 DIAGNOSIS — K219 Gastro-esophageal reflux disease without esophagitis: Secondary | ICD-10-CM | POA: Diagnosis not present

## 2014-09-26 DIAGNOSIS — D649 Anemia, unspecified: Secondary | ICD-10-CM | POA: Diagnosis not present

## 2014-09-26 DIAGNOSIS — F329 Major depressive disorder, single episode, unspecified: Secondary | ICD-10-CM | POA: Diagnosis not present

## 2014-09-26 MED ORDER — ZINC OXIDE 20 % EX OINT
TOPICAL_OINTMENT | CUTANEOUS | Status: DC | PRN
  Administered 2014-09-26 – 2014-09-27 (×2): via TOPICAL
  Filled 2014-09-26: qty 28.35

## 2014-09-26 MED ORDER — ZINC OXIDE 12.8 % EX OINT: TOPICAL_OINTMENT | CUTANEOUS | Status: DC | PRN

## 2014-09-26 MED ORDER — MICONAZOLE NITRATE POWD
Freq: Two times a day (BID) | Status: DC
  Administered 2014-09-26: 22:00:00 via TOPICAL
  Filled 2014-09-26: qty 100

## 2014-09-26 NOTE — Progress Notes (Signed)
Pt wife called to provide collateral information. Pt has been calling pt family on cell phone (that is now in patient locker) stating that if he wanted to kill himself he could use the butcher knife. Pt states that no one needs to be taking the guns away. Pt wife states that the guns he is referring to were taken out of the house a year ago.   Belia Heman, [place] Work  [employer] [phone]

## 2014-09-26 NOTE — Progress Notes (Signed)
ED CM spoke with EDP, [place] about recommended neuro consult per qualitive collaborative meeting ED CM spoke with [facility] providers about recommendation
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
## 2014-09-26 NOTE — Progress Notes (Signed)
Pt referred to [facility] authorization# [id number] CSW completed phone demographics with Robinette at [facility]. Pt clinicals to be reviewed by nurse.    Belia Heman, [place] Work  [employer] [phone]

## 2014-09-26 NOTE — Clinical Social Work Note (Signed)
CSW met with pt for re assessment.  Pt discussed being able to sleep last night and that his mood was good.  He reported that he was "ok with it" being admitted for further treatment and is awaiting a placement.  He also stated that he "wants to keep my family together".  He denied being SI or HI but is anxious to leave the ED and transfer to begin help for himself and "his family".   Pt waiting for gero psych bed.  Dede Query, LCSW [facility] triage
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
## 2014-09-26 NOTE — Progress Notes (Signed)
CSW was notified by nurse that the patient would like to speak with her regarding plan.  CSW met with patient and explained placement seeking process. CSW informed patient that he has been referred to multiple hospitals and that decisions are pending.   Willette Brace [phone] ED CSW 09/26/2014 5:33 PM

## 2014-09-26 NOTE — ED Notes (Signed)
Social worker at bedside now to update pt of plan.

## 2014-09-26 NOTE — ED Notes (Signed)
Pt out at desk using phone.

## 2014-09-26 NOTE — ED Notes (Signed)
Pt called out tohave rail of bed lowered so he could use the restroom.  Pt then asked for rail to be maintained in the lowered position.  Nursing explained that the rail was a safety measure and needed ot be up when the patient was physically in the bed.  The call button was sown and the patient told to call anytime he needed to get out of bed and that the Nurse would come and lower it.  Pt did use the rest room.

## 2014-09-27 DIAGNOSIS — E119 Type 2 diabetes mellitus without complications: Secondary | ICD-10-CM | POA: Diagnosis not present

## 2014-09-27 DIAGNOSIS — I10 Essential (primary) hypertension: Secondary | ICD-10-CM | POA: Diagnosis not present

## 2014-09-27 DIAGNOSIS — F4325 Adjustment disorder with mixed disturbance of emotions and conduct: Secondary | ICD-10-CM | POA: Diagnosis not present

## 2014-09-27 DIAGNOSIS — D649 Anemia, unspecified: Secondary | ICD-10-CM | POA: Diagnosis not present

## 2014-09-27 DIAGNOSIS — E559 Vitamin D deficiency, unspecified: Secondary | ICD-10-CM | POA: Diagnosis not present

## 2014-09-27 DIAGNOSIS — E785 Hyperlipidemia, unspecified: Secondary | ICD-10-CM | POA: Diagnosis not present

## 2014-09-27 DIAGNOSIS — F419 Anxiety disorder, unspecified: Secondary | ICD-10-CM | POA: Diagnosis not present

## 2014-09-27 DIAGNOSIS — M1990 Unspecified osteoarthritis, unspecified site: Secondary | ICD-10-CM | POA: Diagnosis not present

## 2014-09-27 DIAGNOSIS — K219 Gastro-esophageal reflux disease without esophagitis: Secondary | ICD-10-CM | POA: Diagnosis not present

## 2014-09-27 LAB — TSH: TSH: 1.167 u[IU]/mL (ref 0.350–4.500)

## 2014-09-27 NOTE — ED Notes (Signed)
Pt is awake and alert, pt is requesting to use a "bible because its the end." pt denies SI/HI, pt is medication compliant with continue to monitor for safety. tlewis

## 2014-09-27 NOTE — BH Assessment (Signed)
[facility] Assessment Progress Note Patient was seen this date for a re-evaluation and continues to meet the criteria for an inpatient admission, appropriate bed placement continues to be investigated.
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
## 2014-09-27 NOTE — ED Notes (Addendum)
Pt appears depressed this am. He requested to speak to a minister and asked for a bible. Pt does contract for safety and denies Si and HI. Pt was placed in a hospital bed. He stated he felt more comfortable. Contacted Matt ,the chaplain. He will visit with the pt. Shortly. (12noon)Pt remains cooperative and pleasant. Presently he is reading the bible and did request to do a word search.

## 2014-09-28 DIAGNOSIS — E119 Type 2 diabetes mellitus without complications: Secondary | ICD-10-CM | POA: Diagnosis not present

## 2014-09-28 DIAGNOSIS — F4325 Adjustment disorder with mixed disturbance of emotions and conduct: Secondary | ICD-10-CM | POA: Diagnosis not present

## 2014-09-28 DIAGNOSIS — M1990 Unspecified osteoarthritis, unspecified site: Secondary | ICD-10-CM | POA: Diagnosis not present

## 2014-09-28 DIAGNOSIS — E559 Vitamin D deficiency, unspecified: Secondary | ICD-10-CM | POA: Diagnosis not present

## 2014-09-28 DIAGNOSIS — E785 Hyperlipidemia, unspecified: Secondary | ICD-10-CM | POA: Diagnosis not present

## 2014-09-28 DIAGNOSIS — F419 Anxiety disorder, unspecified: Secondary | ICD-10-CM | POA: Diagnosis not present

## 2014-09-28 DIAGNOSIS — D649 Anemia, unspecified: Secondary | ICD-10-CM | POA: Diagnosis not present

## 2014-09-28 DIAGNOSIS — K219 Gastro-esophageal reflux disease without esophagitis: Secondary | ICD-10-CM | POA: Diagnosis not present

## 2014-09-28 DIAGNOSIS — I10 Essential (primary) hypertension: Secondary | ICD-10-CM | POA: Diagnosis not present

## 2014-09-28 MED ORDER — FLUOXETINE HCL 20 MG PO CAPS
20.0000 mg | ORAL_CAPSULE | Freq: Every day | ORAL | Status: DC
  Administered 2014-09-29: 20 mg via ORAL
  Filled 2014-09-28 (×2): qty 1

## 2014-09-28 NOTE — ED Notes (Signed)
Report received from [place], [place].

## 2014-09-28 NOTE — Consult Note (Signed)
  Psychiatric Specialty Exam: Physical Exam  ROS  Blood pressure 179/64, pulse 78, temperature 99.1 F (37.3 C), temperature source Oral, resp. rate 20, SpO2 99 %.There is no weight on file to calculate BMI.  General Appearance: Casual  Eye Contact:: Fair  Speech: Normal Rate  Volume: Normal  Mood: Angry, Anxious   Affect: Congruent  Thought Process: Coherent  Orientation: Full (Time, Place, and Person)  Thought Content: Paranoid Ideation  Suicidal Thoughts: No  Homicidal Thoughts: No  Memory: Immediate; Fair Recent; Fair Remote; Fair  Judgement: Poor  Insight: Lacking  Psychomotor Activity: Normal  Concentration: Fair  Recall: [employer] of Knowledge:Good  Language: Good  Akathisia: No  Handed: Right  AIMS (if indicated):    Assets: Housing Leisure Time Resilience Social Support  ADL's: Intact  Cognition: WNL           Evaluated patient today in the ER.  He reports poor sleep and stated that has been a problem for him even at home..  Patient also stated that he is safer and happier here in the hospital instead of staying home.  Patient reports excessive anger when he is at home with his wife and daughter and their grand Children  He stated that his home is a hostile environment and this is what leads to his anger and aggression towards people in the house.  He reports fairly ok appetite.  Patient denies SI/HI/AVH.  His CT of his head done two days ago is negative.  We will continue to monitor patient.  Adjustment disorder with mixed disturbance of emotions and conduct   Plan: Continue plan of care, admit and seek placement.  Patient is on the waiting list at [facility]   PMHNP-BC  Patient seen face-to-face for psychiatric evaluation, chart reviewed and case discussed with the physician extender and developed treatment plan. Reviewed the information documented and agree with the treatment plan. Corena Pilgrim, MD

## 2014-09-29 DIAGNOSIS — E119 Type 2 diabetes mellitus without complications: Secondary | ICD-10-CM | POA: Diagnosis not present

## 2014-09-29 DIAGNOSIS — F4325 Adjustment disorder with mixed disturbance of emotions and conduct: Secondary | ICD-10-CM | POA: Diagnosis not present

## 2014-09-29 DIAGNOSIS — M1990 Unspecified osteoarthritis, unspecified site: Secondary | ICD-10-CM | POA: Diagnosis not present

## 2014-09-29 DIAGNOSIS — F419 Anxiety disorder, unspecified: Secondary | ICD-10-CM | POA: Diagnosis not present

## 2014-09-29 DIAGNOSIS — F6381 Intermittent explosive disorder: Secondary | ICD-10-CM | POA: Diagnosis not present

## 2014-09-29 DIAGNOSIS — E785 Hyperlipidemia, unspecified: Secondary | ICD-10-CM | POA: Diagnosis not present

## 2014-09-29 DIAGNOSIS — E559 Vitamin D deficiency, unspecified: Secondary | ICD-10-CM | POA: Diagnosis not present

## 2014-09-29 DIAGNOSIS — D649 Anemia, unspecified: Secondary | ICD-10-CM | POA: Diagnosis not present

## 2014-09-29 DIAGNOSIS — K219 Gastro-esophageal reflux disease without esophagitis: Secondary | ICD-10-CM | POA: Diagnosis not present

## 2014-09-29 DIAGNOSIS — I10 Essential (primary) hypertension: Secondary | ICD-10-CM | POA: Diagnosis not present

## 2014-09-29 LAB — BASIC METABOLIC PANEL
ANION GAP: 8 (ref 5–15)
BUN: 15 mg/dL (ref 6–20)
CALCIUM: 9.5 mg/dL (ref 8.9–10.3)
CO2: 26 mmol/L (ref 22–32)
Chloride: 107 mmol/L (ref 101–111)
Creatinine, Ser: 1.2 mg/dL (ref 0.61–1.24)
GFR, EST NON AFRICAN AMERICAN: 60 mL/min — AB (ref >60)
GLUCOSE: 116 mg/dL — AB (ref 65–99)
POTASSIUM: 3.6 mmol/L (ref 3.5–5.1)
SODIUM: 141 mmol/L (ref 135–145)

## 2014-09-29 LAB — CBG MONITORING, ED: GLUCOSE-CAPILLARY: 104 mg/dL — AB (ref 65–99)

## 2014-09-29 MED ORDER — BUPROPION HCL ER (SR) 150 MG PO TB12: 150.0000 mg | ORAL_TABLET | Freq: Two times a day (BID) | ORAL | Status: DC

## 2014-09-29 MED ORDER — FLUOXETINE HCL 20 MG PO CAPS: 20.0000 mg | ORAL_CAPSULE | Freq: Every day | ORAL | Status: DC

## 2014-09-29 MED ORDER — QUETIAPINE FUMARATE 100 MG PO TABS: 100.0000 mg | ORAL_TABLET | Freq: Every day | ORAL | Status: DC

## 2014-09-29 MED ORDER — TRAZODONE HCL 150 MG PO TABS: 150.0000 mg | ORAL_TABLET | Freq: Every evening | ORAL | Status: DC | PRN

## 2014-09-29 MED ORDER — TRAZODONE HCL 50 MG PO TABS
150.0000 mg | ORAL_TABLET | Freq: Every evening | ORAL | Status: DC | PRN
  Administered 2014-09-29: 150 mg via ORAL
  Filled 2014-09-29 (×2): qty 1

## 2014-09-29 NOTE — Progress Notes (Signed)
ED SW and ED CM consulted about resources for pt d/c home  Cm provided [employer] [facility] [phone] from [place]     [phone] from all other areas Provides rides for seniors age 69 & over who are ambulatory to medical appointments in [place] to regional medical facilities. Individuals must be ambulatory to receive rides through the [employer].  Volunteer drivers from the faith community provide the rides to scheduled medical appointments.  Candice Camp, Insurance underwriter [phone]* [email or web address]
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
## 2014-09-29 NOTE — Progress Notes (Signed)
ED Cm and ED SW spoke with pt and wife at [facility] Reviewed D/c instruction resources, Encouraged getting f/u appts today and removal of one gun remaining in the attic and knives in the home.  Encouraged contacting [employer] for safety and making plans for wife and other family safety including leaving and restraining orders as needed.  Pt pleasant, signing himself out of [facility] ED and using crutch without difficulty Pt has 3 prone cane at home and denies further need for other DME.   Refer to ED Sw note

## 2014-09-29 NOTE — Consult Note (Signed)
[facility] Consult   Reason for Consult:  Agitation Referring Physician:  EDP Patient Identification: Gerald [name] MRN:  [id number] Principal Diagnosis: Adjustment disorder with mixed disturbance of emotions and conduct Diagnosis:   Patient Active Problem List   Diagnosis Date Noted  . Intermittent explosive disorder [F63.81] 09/25/2014    Priority: High  . Adjustment disorder with mixed disturbance of emotions and conduct [F43.25] 09/25/2014    Priority: High  . Encounter for long-term (current) use of other medications [Z79.899] 04/28/2013  . Vitamin D Deficiency [E55.9] 04/28/2013  . Sleep disorder -Epworth OSA scale = 15 [G47.9] 06/02/2012  . Anal fissure [K60.2] 08/09/2010  . Septic arthritis of hip [M00.9] 04/11/2010  . HYPERLIPIDEMIA [E78.5] 01/11/2010  . DEPRESSION [F32.9] 01/11/2010  . HYPERTENSION [I10] 01/11/2010  . GERD [K21.9] 01/11/2010  . 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 ANEMIA, IRON DEFICIENCY [D50.9] 09/21/2008  . Personal history of colonic polyps [Z86.010] 09/21/2008    Total Time spent with patient: 30 minutes  Subjective:   Gerald [name] is a 69 y.o. male patient has stabilized and can discharge.  HPI:  The patient has been calm and cooperative since admission, compliant with medications. He states he feels calmer on the new medications.  Denies suicidal/homicidal ideations, hallucinations, and alcohol/drug abuse.  Wife is at the bedside and understands he will be returning home and arrangements will need to be made, if she wants to leave.  He is agreeable to counseling and a neurologist consult to rule out anything neurological. HPI Elements:   Location:  generalized. Quality:  acute. Severity:  mild to moderate. Timing:  intermittent. Duration:  brief. Context:  stressors at home.  Past Medical History:  Past Medical History  Diagnosis Date  . Depression   . Hypertension   . Diabetes mellitus without complication      controlled by diet  . Fatigue   . DOE (dyspnea on exertion)     Echo 04 /22/2014 - normal EF, mild LVH, grade 1 Diastolic Dysfunction; Lexiscan Myoview 04/28/12 - no ishcemia or infarction, LBBB septal wall motion; EF ~60%  . Bilateral edema of lower extremity     LE Dopplers 4/22 - no thrombus or thrombophelbitis, no reflux  . Scrotal edema   . Osteoarthritis     with bilateral hip surgeries and multiple complication, with the most recent  surgery in January 2012  . H/O iron deficiency anemia     with  hemmorrhoids  . Anxiety   . Left bundle branch block      chronic, negative Myoview a normal echo as noted above   . Hyperlipidemia   . GERD (gastroesophageal reflux disease)   . DJD (degenerative joint disease)   . DDD (degenerative disc disease)   . Vitamin D deficiency   . Colon polyp     Past Surgical History  Procedure Laterality Date  . Hip surgery  2004  . Cardiology nuclear med study   04 24 2014    overall impression ; NORMAL  STRESS  NUCLEAR STUDY  . Venous duplex  04 22 2014    LOWER EXTREMITY SWELLING -Impressions [name] ; This is a normal bilateral  lower extremity venous duplex Doppler evaluation   Family History:  Family History  Problem Relation Age of Onset  . Dementia Mother   . Hypertension Mother   . Heart disease Mother   . Diabetes Mother   . Heart attack Maternal Grandmother   . Diabetes Maternal  Grandfather    Social History:  History  Alcohol Use  . 0.5 oz/week  . 1 drink(s) per week    Comment: couple times a year     History  Drug Use No    Social History   Social History  . Marital Status: Married    Spouse Name: N/A  . Number of Children: 2  . Years of Education: N/A   Occupational History  . Retired    Social History Main Topics  . Smoking status: Former Smoker -- 0.50 packs/day for 10 years    Quit date: 03/30/1983  . Smokeless tobacco: Never Used  . Alcohol Use: 0.5 oz/week    1 drink(s) per week     Comment:  couple times a year  . Drug Use: No  . Sexual Activity: Not Currently   Other Topics Concern  . None   Social History Narrative   Drinks tea every once in a while and or a cup of coffee   He is a married father of 2,grand father of 45. He now walks around using cructhes,though he really does not get a lot of excerise.He quit smoking 3/ 1/2 years ago and drinks social alcohol. 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   Additional Social History:    History of alcohol / drug use?: No history of alcohol / drug abuse                     Allergies:  No Known Allergies  Labs:  Results for orders placed or performed during the hospital encounter of 09/25/14 (from the past 48 hour(s))  CBG monitoring, ED     Status: Abnormal   Collection Time: 09/29/14  7:07 AM  Result Value Ref Range   Glucose-Capillary 104 (H) 65 - 99 mg/dL   Comment 1 Notify RN   Basic metabolic panel     Status: Abnormal   Collection Time: 09/29/14  8:45 AM  Result Value Ref Range   Sodium 141 135 - 145 mmol/L   Potassium 3.6 3.5 - 5.1 mmol/L   Chloride 107 101 - 111 mmol/L   CO2 26 22 - 32 mmol/L   Glucose, Bld 116 (H) 65 - 99 mg/dL   BUN 15 6 - 20 mg/dL   Creatinine, Ser 1.20 0.61 - 1.24 mg/dL   Calcium 9.5 8.9 - 10.3 mg/dL   GFR calc non Af Amer 60 (L) >60 mL/min   GFR calc Af Amer >60 >60 mL/min    Comment: (NOTE) The eGFR has been calculated using the CKD EPI equation. This calculation has not been validated in all clinical situations. eGFR's persistently <60 mL/min signify possible Chronic Kidney Disease.    Anion gap 8 5 - 15    Vitals: Blood pressure 130/77, pulse 100, temperature 98 F (36.7 C), temperature source Oral, resp. rate 18, SpO2 100 %.  Risk to Self: Suicidal Ideation: No Suicidal Intent: No Is patient at risk for suicide?: No Suicidal Plan?: No Access to Means: No What has been your use of drugs/alcohol within  the last 12 months?: No alcohol or drug abuse reported.  How many times?: 0 Other Self Harm Risks: No other self harm risk identified at this time.  Triggers for Past Attempts: None known Intentional Self Injurious Behavior: None Risk to Others: Homicidal Ideation: No Thoughts of Harm to Others: No Current Homicidal Intent: No Current  Homicidal Plan: No Access to Homicidal Means: No Identified Victim: N/A History of harm to others?: No Assessment of Violence: On admission Violent Behavior Description: No violent behaviors observed at this time.  Does patient have access to weapons?: Yes (Comment) (Shotgun ) Criminal Charges Pending?: No Does patient have a court date: No Prior Inpatient Therapy: Prior Inpatient Therapy: No Prior Outpatient Therapy:    Current Facility-Administered Medications  Medication Dose Route Frequency Provider Last Rate Last Dose  . cholecalciferol (VITAMIN D) tablet 5,000 Units  5,000 Units Oral Daily Mojeed Akintayo   5,000 Units at 09/29/14 0902  . doxazosin (CARDURA) tablet 8 mg  8 mg Oral Daily Mojeed Akintayo   8 mg at 09/29/14 0902  . enalapril (VASOTEC) tablet 20 mg  20 mg Oral BID Mojeed Akintayo   20 mg at 09/29/14 0902  . ferrous sulfate tablet 325 mg  325 mg Oral Daily Mojeed Akintayo   325 mg at 09/29/14 0903  . FLUoxetine (PROZAC) capsule 20 mg  20 mg Oral Daily Mojeed Akintayo   20 mg at 09/29/14 0902  . furosemide (LASIX) tablet 40 mg  40 mg Oral BID Mojeed Akintayo   40 mg at 09/29/14 0927  . HYDROcodone-acetaminophen (NORCO/VICODIN) 5-325 MG per tablet 1 tablet  1 tablet Oral Q6H PRN Mojeed Akintayo      . magnesium oxide (MAG-OX) tablet 400 mg  400 mg Oral Daily Mojeed Akintayo   400 mg at 09/29/14 0902  . meloxicam (MOBIC) tablet 15 mg  15 mg Oral Daily Mojeed Akintayo   15 mg at 09/29/14 0902  . miconazole nitrate (MICATIN) topical powder   Topical BID Wandra Arthurs, MD   Stopped at 09/28/14 2057  . pantoprazole (PROTONIX) EC tablet 40 mg  40  mg Oral Daily Mojeed Akintayo   40 mg at 09/29/14 0911  . polyethylene glycol (MIRALAX / GLYCOLAX) packet 17 g  17 g Oral Daily Sharlett Iles, MD   17 g at 09/28/14 2100  . potassium chloride (K-DUR,KLOR-CON) CR tablet 10 mEq  10 mEq Oral Daily Patrecia Pour, NP   10 mEq at 09/29/14 5409  . QUEtiapine (SEROQUEL) tablet 100 mg  100 mg Oral QHS Mojeed Akintayo   100 mg at 09/28/14 2102  . traZODone (DESYREL) tablet 150 mg  150 mg Oral QHS PRN Harriet Butte, NP   150 mg at 09/29/14 0235  . vitamin B-12 (CYANOCOBALAMIN) tablet 1,000 mcg  1,000 mcg Oral Daily Mojeed Akintayo   1,000 mcg at 09/29/14 0902  . vitamin C (ASCORBIC ACID) tablet 500 mg  500 mg Oral BID Mojeed Akintayo   500 mg at 09/29/14 0903  . zinc oxide 20 % ointment   Topical PRN Shanon Rosser, MD       Current Outpatient Prescriptions  Medication Sig Dispense Refill  . aspirin 81 MG tablet Take 81 mg by mouth daily.    . bisoprolol-hydrochlorothiazide (ZIAC) 5-6.25 MG per tablet Take 0.5 tablets by mouth daily. 90 tablet 1  . Cholecalciferol (VITAMIN D3) 5000 UNITS CAPS Take 5,000 Units by mouth daily.     Marland Kitchen doxazosin (CARDURA) 8 MG tablet Take 1 tablet (8 mg total) by mouth daily. 90 tablet 3  . enalapril (VASOTEC) 20 MG tablet Take 20 mg by mouth 2 (two) times daily.    . Ferrous Sulfate (IRON) 325 (65 FE) MG TABS Take 65 mg by mouth daily.     . Fiber, Guar Gum, CHEW Chew 1 capsule by mouth  daily.    . furosemide (LASIX) 40 MG tablet Take 1 tablet (40 mg total) by mouth 2 (two) times daily. 180 tablet 3  . Magnesium 250 MG TABS Take 1 tablet by mouth daily.     . Melatonin 5 MG SUBL Place 5 mg under the tongue at bedtime as needed (sleep).     . meloxicam (MOBIC) 15 MG tablet Take 1 tablet (15 mg total) by mouth daily. 90 tablet 1  . Multiple Vitamin (MULTIVITAMIN WITH MINERALS) TABS tablet Take 1 tablet by mouth daily.    Marland Kitchen omeprazole (PRILOSEC) 40 MG capsule Take 1 capsule (40 mg total) by mouth daily. 90 capsule 1  .  polyethylene glycol (MIRALAX / GLYCOLAX) packet Take 17 g by mouth daily.     . vitamin B-12 (CYANOCOBALAMIN) 1000 MCG tablet Take 1,000 mcg by mouth daily.    Marland Kitchen ZINC OXIDE, TOPICAL, (DIAPER RASH) 10 % CREA Apply 1 application topically as needed (for rash).    . ALPRAZolam (XANAX) 1 MG tablet Take 1/2 to 1 tab tid prn anxiety (Patient taking differently: Take 0.5 mg by mouth 3 (three) times daily as needed for anxiety. ) 270 tablet 1  . buPROPion (WELLBUTRIN SR) 150 MG 12 hr tablet Take 1 tablet (150 mg total) by mouth 2 (two) times daily. 60 tablet 0  . FLUoxetine (PROZAC) 20 MG capsule Take 1 capsule (20 mg total) by mouth daily. 90 capsule 3  . HYDROcodone-acetaminophen (NORCO/VICODIN) 5-325 MG per tablet Take 1 tablet by mouth every 6 (six) hours as needed for moderate pain.    Marland Kitchen nystatin (MYCOSTATIN/NYSTOP) 100000 UNIT/GM POWD Apply 1 g topically 2 (two) times daily as needed (rash).    . QUEtiapine (SEROQUEL) 100 MG tablet Take 1 tablet (100 mg total) by mouth at bedtime. 30 tablet 0  . Sennosides (EX-LAX PO) Take 1 tablet by mouth daily as needed (constipation).    . traZODone (DESYREL) 150 MG tablet Take 1 tablet (150 mg total) by mouth at bedtime. For sleep 90 tablet 99  . traZODone (DESYREL) 150 MG tablet Take 1 tablet (150 mg total) by mouth at bedtime as needed for sleep. 30 tablet 0    Musculoskeletal: Strength & Muscle Tone: within normal limits Gait & Station: unsteady Patient leans: N/A  Psychiatric Specialty Exam: Physical Exam  Review of Systems  Constitutional: Negative.   HENT: Negative.   Eyes: Negative.   Respiratory: Negative.   Cardiovascular: Negative.   Gastrointestinal: Negative.   Genitourinary: Negative.   Musculoskeletal: Negative.   Skin: Negative.   Neurological: Negative.   Endo/Heme/Allergies: Negative.   Psychiatric/Behavioral: Positive for memory loss.    Blood pressure 130/77, pulse 100, temperature 98 F (36.7 C), temperature source Oral,  resp. rate 18, SpO2 100 %.There is no weight on file to calculate BMI.  General Appearance: Casual  Eye Contact::  Good  Speech:  Normal Rate  Volume:  Normal  Mood:  Euthymic  Affect:  Congruent  Thought Process:  Coherent  Orientation:  Full (Time, Place, and Person)  Thought Content:  WDL  Suicidal Thoughts:  No  Homicidal Thoughts:  No  Memory:  Immediate;   Good Recent;   Fair Remote;   Good  Judgement:  Fair  Insight:  Good  Psychomotor Activity:  Normal  Concentration:  Good  Recall:  Good to fair  Fund of Knowledge:Good  Language: Good  Akathisia:  No  Handed:  Right  AIMS (if indicated):     Assets:  Financial Resources/Insurance Housing Leisure Time Resilience Social Support  ADL's:  Intact  Cognition: WNL  Sleep:      Medical Decision Making: Review of Psycho-Social Stressors (1) and Review or order clinical lab tests (1)  Treatment Plan Summary: Daily contact with patient to assess and evaluate symptoms and progress in treatment, Medication management and Plan :  adjustment disorder with mixed emotions and conduct  Plan:  No evidence of imminent risk to self or others at present.   Disposition: Discharge home with resources for counseling and a neurology consult  Waylan Boga, [place] 09/29/2014 10:56 AM Patient seen face-to-face for psychiatric evaluation, chart reviewed and case discussed with the physician extender and developed treatment plan. Reviewed the information documented and agree with the treatment plan. Corena Pilgrim, MD
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
## 2014-09-29 NOTE — [facility] Suicide Risk Assessment (Signed)
Suicide Risk Assessment  Discharge Assessment   [facility] Discharge Suicide Risk Assessment   Demographic Factors:  Male and Age 69 or older  Total Time spent with patient: 30 minutes  Musculoskeletal: Strength & Muscle Tone: within normal limits Gait & Station: unsteady Patient leans: N/A  Psychiatric Specialty Exam: Physical Exam  Review of Systems  Constitutional: Negative.   HENT: Negative.   Eyes: Negative.   Respiratory: Negative.   Cardiovascular: Negative.   Gastrointestinal: Negative.   Genitourinary: Negative.   Musculoskeletal: Negative.   Skin: Negative.   Neurological: Negative.   Endo/Heme/Allergies: Negative.   Psychiatric/Behavioral: Positive for memory loss.    Blood pressure 130/77, pulse 100, temperature 98 F (36.7 C), temperature source Oral, resp. rate 18, SpO2 100 %.There is no weight on file to calculate BMI.  General Appearance: Casual  Eye Contact::  Good  Speech:  Normal Rate  Volume:  Normal  Mood:  Euthymic  Affect:  Congruent  Thought Process:  Coherent  Orientation:  Full (Time, Place, and Person)  Thought Content:  WDL  Suicidal Thoughts:  No  Homicidal Thoughts:  No  Memory:  Immediate;   Good Recent;   Fair Remote;   Good  Judgement:  Fair  Insight:  Good  Psychomotor Activity:  Normal  Concentration:  Good  Recall:  Good to fair  Fund of Knowledge:Good  Language: Good  Akathisia:  No  Handed:  Right  AIMS (if indicated):     Assets:  Catering manager Housing Leisure Time Resilience Social Support  ADL's:  Intact  Cognition: WNL  Sleep:      Has this patient used any form of tobacco in the last 30 days? (Cigarettes, Smokeless Tobacco, Cigars, and/or Pipes) N/A  Mental Status Per Nursing Assessment::   On Admission:   Agitation  Current Mental Status by Physician: NA  Loss Factors: NA  Historical Factors: NA  Risk Reduction Factors:   Sense of responsibility to family, Living with another person,  especially a relative and Positive social support  Continued Clinical Symptoms:  None  Cognitive Features That Contribute To Risk:  None    Suicide Risk:  Minimal: No identifiable suicidal ideation.  Patients presenting with no risk factors but with morbid ruminations; may be classified as minimal risk based on the severity of the depressive symptoms  Principal Problem: Adjustment disorder with mixed disturbance of emotions and conduct Discharge Diagnoses:  Patient Active Problem List   Diagnosis Date Noted  . Intermittent explosive disorder [F63.81] 09/25/2014    Priority: High  . Adjustment disorder with mixed disturbance of emotions and conduct [F43.25] 09/25/2014    Priority: High  . Encounter for long-term (current) use of other medications [Z79.899] 04/28/2013  . Vitamin D Deficiency [E55.9] 04/28/2013  . Sleep disorder -Epworth OSA scale = 15 [G47.9] 06/02/2012  . Anal fissure [K60.2] 08/09/2010  . Septic arthritis of hip [M00.9] 04/11/2010  . HYPERLIPIDEMIA [E78.5] 01/11/2010  . DEPRESSION [F32.9] 01/11/2010  . HYPERTENSION [I10] 01/11/2010  . GERD [K21.9] 01/11/2010  . 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 ANEMIA, IRON DEFICIENCY [D50.9] 09/21/2008  . Personal history of colonic polyps [Z86.010] 09/21/2008    Follow-up Information    Schedule an appointment as soon as possible for a visit with [facility].   Contact information:   [address] [place] [address] [phone]      Schedule an appointment as soon as possible for a visit with [facility].   Specialty:  Behavioral Health   Why:  with Dr. Marchelle Gearing and for therapy    Contact information:   [place] [phone]      Follow up with [facility] of [place] . Call on 09/29/2014.   Contact information:   Clinical cytogeneticist from [place]     [phone] from all other areas Provides rides for seniors age 80 & over who are ambulatory to medical appointments in [place] to regional medical facilities. [employer]      Plan Of Care/Follow-up recommendations:  Activity:  as tolerated Diet:  heart healthy diet  Is patient on multiple antipsychotic therapies at discharge:  No   Has Patient had three or more failed trials of antipsychotic monotherapy by history:  No  Recommended Plan for Multiple Antipsychotic Therapies: NA    LORD, JAMISON, PMH-NP 09/29/2014, 11:08 AM

## 2014-10-02 NOTE — Progress Notes (Signed)
Wife called and spoke with ED CM and ED SW via telephone.  Wife reports pt did well this weekend because she was encouraging him to take his medication on time.  Wife reports she is at work today and is concern with pt taking his medications today.  Noted in past hx there has been voiced good family support with family with pt in the home at most times.  Wife states she had difficulty getting pt appts for neuropsychology and with psychiatrist as recommended at d/c (wife reported on day of d/c she had somewhere to go and unable to wait for SW & CM to assist with appt.) Reports that the pcp is assisting with neurology appt because of needed referral and cost if no referral per wife.   Wife offered resources & guidance to get to recommended psychiatrist 1) call [facility] to see if available appt before November, or take pt to walk in clinics like [facility] or [facility] as walk in pt 2) confirmed pt is not a home health candidate related to him not being home bound but is private duty nursing is available but will be out of pocket related his [employer] coverage - Wife expresses she can not afford PDN and if pt was to be sent to a geripsych facility she would not be able to afford being without pt check in the home.   3) Voiced concern with safety- SW offered resources and re discussed option of leaving the home, removal of items in the home that may cause and GPD/restraining orders Wife reports son visited this weekend and he was afraid pt would "snap" and how pt behaved prior to last admission with EMS, her and her daughter

## 2014-10-05 ENCOUNTER — Ambulatory Visit (INDEPENDENT_AMBULATORY_CARE_PROVIDER_SITE_OTHER): Payer: Commercial Managed Care - HMO | Admitting: Neurology

## 2014-10-05 ENCOUNTER — Encounter: Payer: Self-pay | Admitting: Neurology

## 2014-10-05 VITALS — BP 118/72 | HR 73 | Ht 68.0 in | Wt 196.0 lb

## 2014-10-05 DIAGNOSIS — F339 Major depressive disorder, recurrent, unspecified: Secondary | ICD-10-CM | POA: Diagnosis not present

## 2014-10-05 DIAGNOSIS — Z23 Encounter for immunization: Secondary | ICD-10-CM | POA: Diagnosis not present

## 2014-10-05 DIAGNOSIS — R269 Unspecified abnormalities of gait and mobility: Secondary | ICD-10-CM | POA: Diagnosis not present

## 2014-10-05 DIAGNOSIS — E538 Deficiency of other specified B group vitamins: Secondary | ICD-10-CM | POA: Diagnosis not present

## 2014-10-05 DIAGNOSIS — R413 Other amnesia: Secondary | ICD-10-CM | POA: Diagnosis not present

## 2014-10-05 DIAGNOSIS — F4325 Adjustment disorder with mixed disturbance of emotions and conduct: Secondary | ICD-10-CM | POA: Diagnosis not present

## 2014-10-05 DIAGNOSIS — F0391 Unspecified dementia with behavioral disturbance: Secondary | ICD-10-CM | POA: Diagnosis not present

## 2014-10-05 DIAGNOSIS — G4700 Insomnia, unspecified: Secondary | ICD-10-CM | POA: Diagnosis not present

## 2014-10-05 HISTORY — DX: Unspecified abnormalities of gait and mobility: R26.9
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
## 2014-10-05 NOTE — Patient Instructions (Signed)
We will check blood work today, and check MRI of the brain. We will get a neuropsychological test done for the memory.   Fall Prevention and Home Safety Falls cause injuries and can affect all age groups. It is possible to use preventive measures to significantly decrease the likelihood of falls. There are many simple measures which can make your home safer and prevent falls. OUTDOORS  Repair cracks and edges of walkways and driveways.  Remove high doorway thresholds.  Trim shrubbery on the main path into your home.  Have good outside lighting.  Clear walkways of tools, rocks, debris, and clutter.  Check that handrails are not broken and are securely fastened. Both sides of steps should have handrails.  Have leaves, snow, and ice cleared regularly.  Use sand or salt on walkways during winter months.  In the garage, clean up grease or oil spills. BATHROOM  Install night lights.  Install grab bars by the toilet and in the tub and shower.  Use non-skid mats or decals in the tub or shower.  Place a plastic non-slip stool in the shower to sit on, if needed.  Keep floors dry and clean up all water on the floor immediately.  Remove soap buildup in the tub or shower on a regular basis.  Secure bath mats with non-slip, double-sided rug tape.  Remove throw rugs and tripping hazards from the floors. BEDROOMS  Install night lights.  Make sure a bedside light is easy to reach.  Do not use oversized bedding.  Keep a telephone by your bedside.  Have a firm chair with side arms to use for getting dressed.  Remove throw rugs and tripping hazards from the floor. KITCHEN  Keep handles on pots and pans turned toward the center of the stove. Use back burners when possible.  Clean up spills quickly and allow time for drying.  Avoid walking on wet floors.  Avoid hot utensils and knives.  Position shelves so they are not too high or low.  Place commonly used objects  within easy reach.  If necessary, use a sturdy step stool with a grab bar when reaching.  Keep electrical cables out of the way.  Do not use floor polish or wax that makes floors slippery. If you must use wax, use non-skid floor wax.  Remove throw rugs and tripping hazards from the floor. STAIRWAYS  Never leave objects on stairs.  Place handrails on both sides of stairways and use them. Fix any loose handrails. Make sure handrails on both sides of the stairways are as long as the stairs.  Check carpeting to make sure it is firmly attached along stairs. Make repairs to worn or loose carpet promptly.  Avoid placing throw rugs at the top or bottom of stairways, or properly secure the rug with carpet tape to prevent slippage. Get rid of throw rugs, if possible.  Have an electrician put in a light switch at the top and bottom of the stairs. OTHER FALL PREVENTION TIPS  Wear low-heel or rubber-soled shoes that are supportive and fit well. Wear closed toe shoes.  When using a stepladder, make sure it is fully opened and both spreaders are firmly locked. Do not climb a closed stepladder.  Add color or contrast paint or tape to grab bars and handrails in your home. Place contrasting color strips on first and last steps.  Learn and use mobility aids as needed. Install an electrical emergency response system.  Turn on lights to avoid dark areas.  Replace light bulbs that burn out immediately. Get light switches that glow.  Arrange furniture to create clear pathways. Keep furniture in the same place.  Firmly attach carpet with non-skid or double-sided tape.  Eliminate uneven floor surfaces.  Select a carpet pattern that does not visually hide the edge of steps.  Be aware of all pets. OTHER HOME SAFETY TIPS  Set the water temperature for 120 F (48.8 C).  Keep emergency numbers on or near the telephone.  Keep smoke detectors on every level of the home and near sleeping  areas. Document Released: 12/13/2001 Document Revised: 06/24/2011 Document Reviewed: 03/14/2011 [facility] Patient Information 2015 [place], [place]. This information is not intended to replace advice given to you by your health care provider. Make sure you discuss any questions you have with your health care provider.

## 2014-10-09 LAB — COPPER, SERUM: COPPER: 129 ug/dL (ref 72–166)

## 2014-10-09 LAB — VITAMIN B12: VITAMIN B 12: 1717 pg/mL — AB (ref 211–946)

## 2014-10-09 LAB — SEDIMENTATION RATE: SED RATE: 15 mm/h (ref 0–30)

## 2014-10-09 LAB — RPR: RPR: NONREACTIVE

## 2014-10-09 LAB — HIV ANTIBODY (ROUTINE TESTING W REFLEX): HIV SCREEN 4TH GENERATION: NONREACTIVE

## 2014-10-09 LAB — METHYLMALONIC ACID, SERUM: METHYLMALONIC ACID: 212 nmol/L (ref 0–378)

## 2014-10-10 ENCOUNTER — Telehealth: Payer: Self-pay

## 2014-10-10 NOTE — Telephone Encounter (Signed)
-----   Message from Kathrynn Ducking, MD sent at 10/09/2014  5:04 PM EDT -----  The blood work results are unremarkable. Please call the patient.   ----- Message -----    From: [facility] Lab Results In Interface    Sent: 10/06/2014   7:44 AM      To: Kathrynn Ducking, MD

## 2014-10-10 NOTE — Telephone Encounter (Signed)
I called the patient's wife and relayed results. She stated someone needs to find something wrong with him because she can't live like this. She states she is scared to sleep at night. I advised that if she was scared for her safety she should call 911. She said she most definitely will. I advised we are still waiting on the MRI and neuropsych exam and hopefully those will give [place] more answers.

## 2014-10-10 NOTE — Telephone Encounter (Signed)
I tried to call the patient. All numbers have been disconnected.

## 2014-10-19 ENCOUNTER — Ambulatory Visit
Admission: RE | Admit: 2014-10-19 | Discharge: 2014-10-19 | Disposition: A | Discharge status: Home / Self Care | Payer: Commercial Managed Care - HMO | Source: Ambulatory Visit | Attending: Neurology | Admitting: Neurology

## 2014-10-19 DIAGNOSIS — F4325 Adjustment disorder with mixed disturbance of emotions and conduct: Secondary | ICD-10-CM

## 2014-10-19 DIAGNOSIS — E538 Deficiency of other specified B group vitamins: Secondary | ICD-10-CM

## 2014-10-19 DIAGNOSIS — R269 Unspecified abnormalities of gait and mobility: Secondary | ICD-10-CM | POA: Diagnosis not present

## 2014-10-19 DIAGNOSIS — R413 Other amnesia: Secondary | ICD-10-CM

## 2014-10-22 ENCOUNTER — Telehealth: Payer: Self-pay | Admitting: Neurology

## 2014-10-22 NOTE — Telephone Encounter (Signed)
  I called the patient. The MRI of the brain shows minimal SVD, neuropsychological testing is pending. Blood work was [employer].  MRI brain 10/20/14:  IMPRESSION: Slightly abnormal MRI scan of the brain showing mild degree of generalized cerebral atrophy and chronic microvascular ischemic changes. No significant change compared with CT head 09/26/2014

## 2014-10-24 ENCOUNTER — Telehealth: Payer: Self-pay | Admitting: Neurology

## 2014-10-24 ENCOUNTER — Encounter (HOSPITAL_COMMUNITY): Payer: Self-pay | Admitting: Psychiatry

## 2014-10-24 ENCOUNTER — Ambulatory Visit (INDEPENDENT_AMBULATORY_CARE_PROVIDER_SITE_OTHER): Payer: Medicare HMO | Admitting: Psychiatry

## 2014-10-24 VITALS — BP 170/78 | HR 66 | Ht 66.0 in | Wt 200.6 lb

## 2014-10-24 DIAGNOSIS — Z13820 Encounter for screening for osteoporosis: Secondary | ICD-10-CM | POA: Diagnosis not present

## 2014-10-24 DIAGNOSIS — Z1389 Encounter for screening for other disorder: Secondary | ICD-10-CM | POA: Diagnosis not present

## 2014-10-24 DIAGNOSIS — E538 Deficiency of other specified B group vitamins: Secondary | ICD-10-CM | POA: Diagnosis not present

## 2014-10-24 DIAGNOSIS — D696 Thrombocytopenia, unspecified: Secondary | ICD-10-CM | POA: Diagnosis not present

## 2014-10-24 DIAGNOSIS — F331 Major depressive disorder, recurrent, moderate: Secondary | ICD-10-CM

## 2014-10-24 DIAGNOSIS — E559 Vitamin D deficiency, unspecified: Secondary | ICD-10-CM | POA: Diagnosis not present

## 2014-10-24 DIAGNOSIS — G3184 Mild cognitive impairment, so stated: Secondary | ICD-10-CM

## 2014-10-24 DIAGNOSIS — I5032 Chronic diastolic (congestive) heart failure: Secondary | ICD-10-CM | POA: Diagnosis not present

## 2014-10-24 DIAGNOSIS — F17211 Nicotine dependence, cigarettes, in remission: Secondary | ICD-10-CM | POA: Diagnosis not present

## 2014-10-24 DIAGNOSIS — F339 Major depressive disorder, recurrent, unspecified: Secondary | ICD-10-CM | POA: Diagnosis not present

## 2014-10-24 DIAGNOSIS — E1122 Type 2 diabetes mellitus with diabetic chronic kidney disease: Secondary | ICD-10-CM | POA: Diagnosis not present

## 2014-10-24 DIAGNOSIS — Z125 Encounter for screening for malignant neoplasm of prostate: Secondary | ICD-10-CM | POA: Diagnosis not present

## 2014-10-24 DIAGNOSIS — E785 Hyperlipidemia, unspecified: Secondary | ICD-10-CM | POA: Diagnosis not present

## 2014-10-24 DIAGNOSIS — Z0000 Encounter for general adult medical examination without abnormal findings: Secondary | ICD-10-CM | POA: Diagnosis not present

## 2014-10-24 DIAGNOSIS — I129 Hypertensive chronic kidney disease with stage 1 through stage 4 chronic kidney disease, or unspecified chronic kidney disease: Secondary | ICD-10-CM | POA: Diagnosis not present

## 2014-10-24 DIAGNOSIS — Z23 Encounter for immunization: Secondary | ICD-10-CM | POA: Diagnosis not present

## 2014-10-24 MED ORDER — FLUOXETINE HCL 40 MG PO CAPS: 40.0000 mg | ORAL_CAPSULE | Freq: Every day | ORAL | Status: DC

## 2014-10-24 NOTE — Telephone Encounter (Signed)
I called the patient's wife. I relayed Dr. Jannifer Franklin' telephone note from 10/22/14. I explained that there was minimal SVD and not a significant change from the last CT scan. She thanked me for calling. She is frustrated at the fact that the results keep coming back normal though. She is scared of the patient and seems to think he is putting on an act. I advised if she is scared for her safety that she call the police and she said she "certainly will."
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
## 2014-10-24 NOTE — Telephone Encounter (Signed)
Patient's wife is calling to get MRI results for the patient.

## 2014-10-25 ENCOUNTER — Other Ambulatory Visit: Payer: Self-pay | Admitting: Internal Medicine

## 2014-11-16 ENCOUNTER — Encounter (HOSPITAL_COMMUNITY): Payer: Self-pay | Admitting: Psychiatry

## 2014-11-16 ENCOUNTER — Ambulatory Visit (INDEPENDENT_AMBULATORY_CARE_PROVIDER_SITE_OTHER): Payer: Medicare HMO | Admitting: Psychiatry

## 2014-11-16 VITALS — BP 115/66 | HR 72 | Ht 66.0 in | Wt 200.6 lb

## 2014-11-16 DIAGNOSIS — F331 Major depressive disorder, recurrent, moderate: Secondary | ICD-10-CM

## 2014-11-16 DIAGNOSIS — G3184 Mild cognitive impairment, so stated: Secondary | ICD-10-CM

## 2014-11-16 MED ORDER — TRAZODONE HCL 50 MG PO TABS: 50.0000 mg | ORAL_TABLET | Freq: Every day | ORAL | Status: DC

## 2014-11-16 MED ORDER — QUETIAPINE FUMARATE 100 MG PO TABS: 100.0000 mg | ORAL_TABLET | Freq: Every day | ORAL | Status: DC

## 2014-11-16 MED ORDER — BUPROPION HCL ER (XL) 150 MG PO TB24: 150.0000 mg | ORAL_TABLET | ORAL | Status: DC

## 2014-11-16 MED ORDER — FLUOXETINE HCL 40 MG PO CAPS: 40.0000 mg | ORAL_CAPSULE | Freq: Every day | ORAL | Status: DC
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
## 2014-11-22 ENCOUNTER — Other Ambulatory Visit (HOSPITAL_COMMUNITY): Payer: Self-pay | Admitting: Psychiatry

## 2014-11-23 DIAGNOSIS — E119 Type 2 diabetes mellitus without complications: Secondary | ICD-10-CM | POA: Diagnosis not present

## 2014-11-23 DIAGNOSIS — H40023 Open angle with borderline findings, high risk, bilateral: Secondary | ICD-10-CM | POA: Diagnosis not present

## 2014-11-23 DIAGNOSIS — H35371 Puckering of macula, right eye: Secondary | ICD-10-CM | POA: Diagnosis not present

## 2014-11-23 DIAGNOSIS — H35362 Drusen (degenerative) of macula, left eye: Secondary | ICD-10-CM | POA: Diagnosis not present

## 2014-11-23 DIAGNOSIS — Z961 Presence of intraocular lens: Secondary | ICD-10-CM | POA: Diagnosis not present

## 2014-11-24 ENCOUNTER — Telehealth (HOSPITAL_COMMUNITY): Payer: Self-pay

## 2014-11-24 DIAGNOSIS — F331 Major depressive disorder, recurrent, moderate: Secondary | ICD-10-CM

## 2014-11-24 NOTE — Telephone Encounter (Signed)
  Medication management - Called [employer] to verify patient's orders for Trazodone and Wellbutirin XL e-scribed on 11/16/14 by Dr. Adele Schilder.  Verified patient is now taking Wellbutrin XL 150mg  one every morning and Trazodone 50 mg one at bedtime per orders and record review of Dr. Marchia Bond note 11/16/14 with Earlie Server, pharmacist with [facility].

## 2014-11-24 NOTE — Telephone Encounter (Signed)
Telephone call from patient's wife stating patient will be running out of Xanax and Dr. Adele Schilder had instructed them he would be taking over all patient's psychiatric medications.  Ms. Gribbins requested Dr. Adele Schilder provide patient with a new Xanax order as states he sent in other psychiatric medications for patient.  Informed Dr. Adele Schilder would be out until 12/05/14 and Ms. Fuller reported patient has enough to last until then.  Ms. Feuerhelm agreed to call back that date to make sure Dr. Adele Schilder got the refill request and agreed to send note to Dr. Adele Schilder about the request for him to see upon his return.  Ms. Ciccarello to call back if any further problems prior to that time.

## 2014-11-29 ENCOUNTER — Encounter (HOSPITAL_COMMUNITY): Payer: Self-pay | Admitting: Clinical

## 2014-11-29 ENCOUNTER — Ambulatory Visit (INDEPENDENT_AMBULATORY_CARE_PROVIDER_SITE_OTHER): Payer: Medicare HMO | Admitting: Clinical

## 2014-11-29 DIAGNOSIS — F331 Major depressive disorder, recurrent, moderate: Secondary | ICD-10-CM | POA: Diagnosis not present
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
## 2014-12-06 NOTE — Telephone Encounter (Signed)
Telephone message left for patient and his wife on their mobile phone to question which pharmacy they wanted patient's Xanax order called into as Dr. Adele Schilder authorized a new order plus one refill until patient returns to see him on 01/16/15.  Requested patient call back with instruction of where prescription should be called in to assist with new order.

## 2014-12-08 MED ORDER — ALPRAZOLAM 0.5 MG PO TABS: 0.5000 mg | ORAL_TABLET | Freq: Three times a day (TID) | ORAL | Status: DC | PRN

## 2014-12-08 NOTE — Telephone Encounter (Signed)
Telephone call with patient's wife who called back to report she was in need of the Xanax order to go to [facility].  Agreed to call them to give order and met with Dr. Adele Schilder for approval.  Called in Alprazolam 0.5 mg, one three times a day as needed for anxiety, #45 with 1 refill with Wyatt Portela, pharmacist at [facility] as authorized by Dr. Adele Schilder.  Patient to keep appointment set for 01/16/15.

## 2014-12-19 ENCOUNTER — Other Ambulatory Visit (HOSPITAL_COMMUNITY): Payer: Self-pay | Admitting: Psychiatry

## 2014-12-19 DIAGNOSIS — F331 Major depressive disorder, recurrent, moderate: Secondary | ICD-10-CM

## 2014-12-22 NOTE — Telephone Encounter (Signed)
Met with Dr. Adele Schilder who approved a one time refill of patient's Wellbutrin for 30 days supply to [facility] and order e-scribed as instructed.

## 2014-12-27 ENCOUNTER — Ambulatory Visit: Payer: Commercial Managed Care - HMO | Attending: Psychology | Admitting: Psychology

## 2014-12-27 DIAGNOSIS — G4720 Circadian rhythm sleep disorder, unspecified type: Secondary | ICD-10-CM | POA: Insufficient documentation

## 2014-12-27 DIAGNOSIS — G3184 Mild cognitive impairment, so stated: Secondary | ICD-10-CM | POA: Insufficient documentation

## 2014-12-27 DIAGNOSIS — F331 Major depressive disorder, recurrent, moderate: Secondary | ICD-10-CM

## 2014-12-28 ENCOUNTER — Ambulatory Visit (INDEPENDENT_AMBULATORY_CARE_PROVIDER_SITE_OTHER): Payer: Medicare HMO | Admitting: Clinical

## 2014-12-28 DIAGNOSIS — F331 Major depressive disorder, recurrent, moderate: Secondary | ICD-10-CM | POA: Diagnosis not present

## 2014-12-28 NOTE — Progress Notes (Signed)
[facility]  [address]   Telephone [phone] [address] Fax [phone] [place], [place] [address]  Initial Contact Note  Name:  [name] Date of Birth; 08/31/1945 MRN:  [id number] Date:  12/28/2014  Gerald [name] is an 69 y.o. male who was referred for neuropsychological evaluation by Floyde Parkins, MD of [facility] due to persisting problems with memory.   A total of 7 hours was spent today reviewing medical records, interviewing (CPT [id number]) [name] and his wife, administering and scoring neurocognitive tests and preparing a written report. (CPT [id number], [id number] & [id number]).  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   A full report will follow once the planned testing has been completed. His next appointment to discuss results is scheduled for 01/05/15.    Jamey Ripa, Ph.D Licensed Psychologist 12/28/2014

## 2015-01-02 ENCOUNTER — Encounter (HOSPITAL_COMMUNITY): Payer: Self-pay | Admitting: Clinical

## 2015-01-02 NOTE — Progress Notes (Signed)
   THERAPIST PROGRESS NOTE  Session Time: 2:40 -3:38  Participation Level: Active  Behavioral Response: Well GroomedAlertDepressed  Type of Therapy: Individual Therapy  Treatment Goals addressed: Improve psychiatric symptoms,   Interventions: Motivational Interviewing  Summary: Gerald [name] is a 69 y.o. male who presents with Major Depressive Disorder, recurrent, moderate.   Suicidal/Homicidal: Nowithout intent/plan  Therapist Response: Gerald [name] met with clinician for an individual session. Gerald [name] discussed his psychiatric symptoms, his current life events and his goals for therapy. Gerald [name] shared that he has not lost his temper since his assessment. He shared he has been letting things "roll of his back." When he shared about this it seems that Gerald [name] is instead "stuffing his feelings" Gerald [name] gave some examples about the things he has been "roll of his back." He shared that he does not feel like he is included in the decision making anymore in his home. 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 Gerald [name] shared that this was is empowering to him, but he wants his wife to be happy and has not spoken to her about his feeling. Gerald [name] and clinician discussed the possibility of having her join him in some future sessions. Gerald [name] and clinician discussed how he might share with her his feelings. He stated that he is not ready yet because he does not want to upset things. Gerald [name] and clinician discussed what Gerald [name] does have the ability to change. Gerald [name] shared that he would like to start going to the gym again. He stated that he would either have to get his car or have his granddaughter drive him to the gym. He shared that he  used to enjoy working out at [employer]. He shared that it relieved his stress and made him feel better about his body. Gerald [name] and clinician discussed the activities he enjoys at the gym.Gerald [name] and clinician discussed what activities might need to change due to his physical health ( and doctors suggestions). Gerald [name] shared that he would like to make it a goal to start working out again before returning to next session.    Plan: Return again in 1-2 weeks.  Diagnosis: Axis I: Major Depressive Disorder, recurrent, moderate      Mellanie Bejarano A, LCSW 12/28/2014

## 2015-01-05 ENCOUNTER — Encounter: Payer: Self-pay | Admitting: Psychology

## 2015-01-05 ENCOUNTER — Ambulatory Visit (INDEPENDENT_AMBULATORY_CARE_PROVIDER_SITE_OTHER): Payer: Commercial Managed Care - HMO | Admitting: Psychology

## 2015-01-05 DIAGNOSIS — F331 Major depressive disorder, recurrent, moderate: Secondary | ICD-10-CM

## 2015-01-05 DIAGNOSIS — G3184 Mild cognitive impairment, so stated: Secondary | ICD-10-CM

## 2015-01-05 DIAGNOSIS — G4720 Circadian rhythm sleep disorder, unspecified type: Secondary | ICD-10-CM
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
## 2015-01-09 ENCOUNTER — Telehealth: Payer: Self-pay | Admitting: Neurology

## 2015-01-09 NOTE — Telephone Encounter (Signed)
The neuropsychological evaluation showed mild progression of memory issues over 5 years, this result was already discussed with the patient, I did not call.  Neuropsychological evaluation 01/05/2015:  Summary & Conclusions Gerald [name] is a 70 year-old man with an approximate ten year history of memory difficulties with concomitant depression. A neuropsychological evaluation In September 2011 did not reveal any indications of cognitive disorder. A neuropsychological re-evaluation in December 2013 indicated a weakness for executive function. He also performed somewhat less well on memory tests though memory functioning was still within normal limits. During each evaluation, he endorsed a moderately severe level of depression.  Currently, Gerald [name] demonstrates mild impairment for executive function evident on measures of speed of processing, set shifting efficiency, word fluency, set maintenance, conceptual flexibility and novel problem-solving. Measures of working memory were within the Average range. Measures of immediate and delayed memory fell within the Low Average range, which was lower than expected given his estimated pre-morbid level. His delayed recall was as expected given his initial level of encoding, which indicated intact memory storage.   Compared to the previous neuropsychological assessment of 2013, he demonstrated statistically significant albeit mild declines on measures of delayed memory and auditory memory as they both dropped from the Average to the Low Average range. His scores on tests that required simple or complex visual sequencing, phonemic fluency or visual-spatial assembly were lower though it was unclear whether these discrepancies represented statistically significant changes.  With regards to his emotional functioning, he continued to report clinically significant depression characterized primarily by disturbances of vegetative and cognitive functioning. He  acknowledged having had thoughts of suicide over the past two weeks but denied intent. There were no indications of mood instability or a psychotic disorder.  In conclusion, Gerald [name] presents with reduced executive function in context of chronic depression and an irregular sleep-wake cycle. Over that past five years, he has shown a modest decline in executive and memory functioning though his memory storage continues to be well-preserved. If he has a cognitive disorder due to a neurological condition, it is relatively slow in progression as he has complained of memory loss for about ten years now without indications of dementia. His neuropsychological difficulties specific to complex attention and executive function considered in light of his medical history of hypertension, Diabetes Type II and hyperlipidemia might suggest the possibility of vascular cognitive dysfunction. Chronic depression also continues to be a likely explanation to account for his neurocognitive profile. Finally, daytime fatigue due to his irregular sleep-wake cycle is likely contributing to his diminished mental sharpness as well as his psychiatric difficulties.  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 He should continue with behavioral health services. It might be helpful to include his wife in a meeting with his psychiatrist and/or counselor.  2. His irregular sleep-wake cycle continued to be of concern with regards to his mental sharpness and mood. Moreover, it is possible that the effectiveness of his medications is being compromised based on his wife's report that he often does not take his medications at the times prescribed each day due to his sleep-wake cycle. He was again urged to work towards normalizing his sleep-wake cycle with the  assistance of his physicians.  3. The importance of complying with prescribed medications and dietary guidelines to treat his medical conditions that put him at risk for cerebrovascular disease was discussed.  4. A  repeat neuropsychological evaluation should be considered in three years (or sooner should he demonstrate substantial changes in cognitive or adaptive functioning) in order to track his cognitive functioning.  The results and recommendations from this evaluation were discussed with Mr. Dingus and his wife on 01/05/15.

## 2015-01-11 ENCOUNTER — Encounter (HOSPITAL_COMMUNITY): Payer: Self-pay | Admitting: Clinical

## 2015-01-11 ENCOUNTER — Ambulatory Visit (INDEPENDENT_AMBULATORY_CARE_PROVIDER_SITE_OTHER): Payer: Commercial Managed Care - HMO | Admitting: Clinical

## 2015-01-11 DIAGNOSIS — F331 Major depressive disorder, recurrent, moderate: Secondary | ICD-10-CM | POA: Diagnosis not present

## 2015-01-11 NOTE — Progress Notes (Signed)
   THERAPIST PROGRESS NOTE  Session Time: 3:36 -4:30  Participation Level: Active  Behavioral Response: CasualAlertDepressed  Type of Therapy: Individual Therapy  Treatment Goals addressed: improve psychiatric symptoms, elevate mood  Interventions: Motivational Interviewing  Summary: [name] is a 70 y.o. male who presents with Major Depressive Disorder, recurrent, moderate .   Suicidal/Homicidal: Nowithout intent/plan    Therapist Response: Gerald [name] met with clinician for an individual session. Gerald [name]'s  Wife joined the session. Gerald [name] discussed his psychiatric symptoms and his current life events. [name] shared that he continues to be depressed. His wife shared that she was unaware that her husband had been taking medication for depression for over 20 years. Gerald [name], wife, and clinician discussed some of his symptoms of depression as well as some of his thoughts about feeling dis-empowered. Wife shared her experience with his behaviors. Gerald [name] shared he wanted to be closer to his wife and also be included in the on goings in the house. Wife shared that Gerald [name] is awake when everyone is a sleep and visa versa. She shared that he is basically absent from their lives. Gerald [name] and clinician discussed his goal to be more connected and being available for this. He shared he enjoys his grandson, he is a motivation for change.Client and clinician discussed good sleep hygiene and shifts he could make to be more present with his family. Gerald [name] shared he would like to get back to the gym.  He agreed to try the sleep routine as well as go to the gym in the morning hours (rather than late at night). He has not returned to the gym yet.   Plan: Return again in 1-2 weeks.  Diagnosis: Axis I: Major Depressive Disorder, recurrent, moderate      Gerald [name] A, LCSW 01/11/2015

## 2015-01-15 ENCOUNTER — Other Ambulatory Visit (HOSPITAL_COMMUNITY): Payer: Self-pay | Admitting: Psychiatry

## 2015-01-16 ENCOUNTER — Encounter (HOSPITAL_COMMUNITY): Payer: Self-pay | Admitting: Psychiatry

## 2015-01-16 ENCOUNTER — Ambulatory Visit (INDEPENDENT_AMBULATORY_CARE_PROVIDER_SITE_OTHER): Payer: Commercial Managed Care - HMO | Admitting: Psychiatry

## 2015-01-16 VITALS — BP 140/82 | HR 76 | Ht 66.5 in | Wt 202.8 lb

## 2015-01-16 DIAGNOSIS — F331 Major depressive disorder, recurrent, moderate: Secondary | ICD-10-CM

## 2015-01-16 DIAGNOSIS — G3184 Mild cognitive impairment, so stated: Secondary | ICD-10-CM | POA: Diagnosis not present

## 2015-01-16 MED ORDER — FLUOXETINE HCL 40 MG PO CAPS: 40.0000 mg | ORAL_CAPSULE | Freq: Every day | ORAL | Status: DC

## 2015-01-16 MED ORDER — BUPROPION HCL ER (XL) 150 MG PO TB24: ORAL_TABLET | ORAL | Status: DC

## 2015-01-16 MED ORDER — QUETIAPINE FUMARATE 200 MG PO TABS: 200.0000 mg | ORAL_TABLET | Freq: Every day | ORAL | Status: DC

## 2015-01-16 MED ORDER — ALPRAZOLAM 0.5 MG PO TABS: 0.5000 mg | ORAL_TABLET | Freq: Three times a day (TID) | ORAL | Status: AC | PRN

## 2015-01-16 NOTE — Progress Notes (Signed)
[facility] [phone] Progress Note  LYNCH GUCKERT [id number] 70 y.o.  01/16/2015 4:01 PM  Chief Complaint:  I'm taking medication with some time I cannot sleep.  I still have anger issues.     History of Present Illness:  Rendell came for his follow-up appointment with his wife.  He is taking his medication as prescribed.  He denies any side effects.  Sometime he complained poor sleep, irritability and anger issues but overall he feels medicine is working.  He is complaining of dry mouth and he has noticed more forgetful and memory issues.  He saw a neurologist and also he had psychological testing which shows cognitive impairment.  His wife endorsed overall his mood improved but sometime he he do not sleep well and restless in the night.  He gets irritable when he is told to sleep.  He is taking multiple psychiatric medications.  His wife is concerned about his anger and does not want to lower or reduce the medication.  However I discuss multiple psychiatric medication can cause worsening of memory, constipation, dry mouth and she is willing to stop trazodone.  I recommended to try increase Seroquel which can help his sleep and anger issues.  He is taking Wellbutrin, Prozac, Xanax he has no tremors or shakes.  He has been not involved in any aggressive behavior.  He denies any paranoia.  He had a good Christmas.  His mother is an memory clinic facility.  Patient denies drinking or using any illegal substances.  He still complained about dry mouth and sometime constipation but he is not confused.  Has appetite is okay.  His vitals are stable.  He is seeing Tharon Aquas for coping skills.  Suicidal Ideation: No Plan Formed: No Patient has means to carry out plan: No  Homicidal Ideation: No Plan Formed: No Patient has means to carry out plan: No  Past Psychiatric History/Hospitalization(s): Patient remember taking psychiatric medication more than 20 years from his primary care physician.   He's been taking Xanax from Dr. Charna Busman for at least 15-20 years.  He is prescribed Prozac, Wellbutrin and trazodone.  In September 2016 he was seen in the emergency room after his wife taken out IVC papers.   recently his medicine were given by Dr. Alyson Ingles. He was aggressive towards his granddaughter and his wife.  He was given Seroquel.  Patient denies any history of suicidal attempt, psychosis, hallucination, inpatient psychiatric treatment. Anxiety: Yes Bipolar Disorder: No Depression: Yes Mania: No Psychosis: No Schizophrenia: No Personality Disorder: No Hospitalization for psychiatric illness: Patient was seen in the emergency room in September 2016 because of aggressive behavior History of Electroconvulsive Shock Therapy: No Prior Suicide Attempts: No  Medical History; Patient has hypertension, diabetes mellitus without complication, bilateral edema of lower extremities, scrotal edema, osteoarthritis, GERD, hyperlipidemia, degenerative disc disease, history of hip surgery status post complication causing shortening of left leg and memory impairment.  His primary care physician is Dr. Trilby Drummer.  He is also seeing Dr. Jannifer Franklin for memory impairment.  Family History; Patient endorse his mother has dementia which got worse and she was institutionalized.  Psychosocial History; Patient born and raised in [place].  His been married for 47 years.  He has 3 children.  His son lives in [place].  His daughter recently moved from [place] with her 2 kids.  Patient lives with his wife.  Review of Systems: Psychiatric: Agitation: No Hallucination: No Depressed Mood: No Insomnia: No Hypersomnia: No Altered  Concentration: Memory impairment Feels Worthless: No Grandiose Ideas: No Belief In Special Powers: No New/Increased Substance Abuse: No Compulsions: No  Neurologic: Headache: No Seizure: No Paresthesias: No   Outpatient Encounter Prescriptions as  of 01/16/2015  Medication Sig  . ALPRAZolam (XANAX) 0.5 MG tablet Take 1 tablet (0.5 mg total) by mouth 3 (three) times daily as needed for anxiety.  Marland Kitchen aspirin 81 MG tablet Take 81 mg by mouth daily.  . bisoprolol-hydrochlorothiazide (ZIAC) 5-6.25 MG per tablet Take 0.5 tablets by mouth daily.  Marland Kitchen buPROPion (WELLBUTRIN XL) 150 MG 24 hr tablet TAKE 1 TABLET (150 MG TOTAL) BY MOUTH EVERY MORNING.  Marland Kitchen Cholecalciferol (VITAMIN D3) 5000 UNITS CAPS Take 5,000 Units by mouth daily.   Marland Kitchen doxazosin (CARDURA) 8 MG tablet Take 1 tablet (8 mg total) by mouth daily.  . enalapril (VASOTEC) 20 MG tablet Take 20 mg by mouth 2 (two) times daily.  . Ferrous Sulfate (IRON) 325 (65 FE) MG TABS Take 65 mg by mouth daily.   . Fiber, Guar Gum, CHEW Chew 1 capsule by mouth daily.  Marland Kitchen FLUoxetine (PROZAC) 40 MG capsule Take 1 capsule (40 mg total) by mouth daily.  . furosemide (LASIX) 40 MG tablet Take 1 tablet (40 mg total) by mouth 2 (two) times daily. (Patient not taking: Reported on 11/29/2014)  . Magnesium 250 MG TABS Take 1 tablet by mouth daily.   . Melatonin 5 MG SUBL Place 5 mg under the tongue at bedtime as needed (sleep).   . meloxicam (MOBIC) 15 MG tablet Take 1 tablet (15 mg total) by mouth daily.  . Multiple Vitamin (MULTIVITAMIN WITH MINERALS) TABS tablet Take 1 tablet by mouth daily.  Marland Kitchen nystatin (MYCOSTATIN/NYSTOP) 100000 UNIT/GM POWD Apply 1 g topically 2 (two) times daily as needed (rash).  Marland Kitchen omeprazole (PRILOSEC) 40 MG capsule Take 1 capsule (40 mg total) by mouth daily.  . polyethylene glycol (MIRALAX / GLYCOLAX) packet Take 17 g by mouth daily.   . QUEtiapine (SEROQUEL) 200 MG tablet Take 1 tablet (200 mg total) by mouth at bedtime.  . Sennosides (EX-LAX PO) Take 1 tablet by mouth daily as needed (constipation).  . vitamin B-12 (CYANOCOBALAMIN) 1000 MCG tablet Take 1,000 mcg by mouth daily.  Marland Kitchen ZINC OXIDE, TOPICAL, (DIAPER RASH) 10 % CREA Apply 1 application topically as needed (for rash).  .  [DISCONTINUED] ALPRAZolam (XANAX) 0.5 MG tablet Take 1 tablet (0.5 mg total) by mouth 3 (three) times daily as needed for anxiety.  . [DISCONTINUED] buPROPion (WELLBUTRIN XL) 150 MG 24 hr tablet TAKE 1 TABLET (150 MG TOTAL) BY MOUTH EVERY MORNING.  . [DISCONTINUED] FLUoxetine (PROZAC) 40 MG capsule Take 1 capsule (40 mg total) by mouth daily.  . [DISCONTINUED] QUEtiapine (SEROQUEL) 100 MG tablet Take 1 tablet (100 mg total) by mouth at bedtime.  . [DISCONTINUED] traZODone (DESYREL) 50 MG tablet Take 1 tablet (50 mg total) by mouth at bedtime.   No facility-administered encounter medications on file as of 01/16/2015.    No results found for this or any previous visit (from the past 2160 hour(s)).    Constitutional:  BP 140/82 mmHg  Pulse 76  Ht 5' 6.5" (1.689 m)  Wt 202 lb 12.8 oz (91.989 kg)  BMI 32.25 kg/m2   Musculoskeletal: Strength & Muscle Tone: decreased Gait & Station: unsteady, Using walker Patient leans: Front and Backward  Psychiatric Specialty Exam: General Appearance: Casual  Eye Contact::  Fair  Speech:  Slow  Volume:  Decreased  Mood:  Euthymic  Affect:  Blunt  Thought Process:  Loose and Slow  Orientation:  Full (Time, Place, and Person)  Thought Content:  Rumination  Suicidal Thoughts:  No  Homicidal Thoughts:  No  Memory:  Immediate;   Fair Recent;   Poor Remote;   Fair  Judgement:  Fair  Insight:  Fair  Psychomotor Activity:  Decreased  Concentration:  Fair  Recall:  [employer] of Knowledge:  Fair  Language:  Fair  Akathisia:  No  Handed:  Right  AIMS (if indicated):     Assets:  Communication Skills Desire for Improvement Financial Resources/Insurance Housing Social Support  ADL's:  Intact  Cognition:  Impaired,  Mild  Sleep:        Established Problem, Stable/Improving (1), Decision to obtain old records (1), Review and summation of old records (2), Review of Last Therapy Session (1) and Review of Medication Regimen & Side Effects  (2)  Assessment: Axis I: Major depressive disorder, recurrent moderate.  Cognitive disorder NOS.  Axis II: Deferred  Axis III:  Past Medical History  Diagnosis Date  . Depression   . Hypertension   . Diabetes mellitus without complication ([place])     controlled by diet  . Fatigue   . DOE (dyspnea on exertion)     Echo 04 /22/2014 - normal EF, mild LVH, grade 1 Diastolic Dysfunction; Lexiscan Myoview 04/28/12 - no ishcemia or infarction, LBBB septal wall motion; EF ~60%  . Bilateral edema of lower extremity     LE Dopplers 4/22 - no thrombus or thrombophelbitis, no reflux  . Scrotal edema   . Osteoarthritis     with bilateral hip surgeries and multiple complication, with the most recent  surgery in January 2012  . H/O iron deficiency anemia     with  hemmorrhoids  . Anxiety   . Left bundle branch block      chronic, negative Myoview a normal echo as noted above   . Hyperlipidemia   . GERD (gastroesophageal reflux disease)   . DJD (degenerative joint disease)   . DDD (degenerative disc disease)   . Vitamin D deficiency   . Colon polyp   . Abnormality of gait 10/05/2014     Plan:  I reviewed records from neurology and psychological testing.  Patient diagnosed with cognitive impairment.  Recommended discontinue trazodone and try Seroquel 200 mg to help insomnia and residual anger issues.  Stopping trazodone may help his dry mouth and constipation.  I will continue Wellbutrin XL 150 mg daily, Prozac 40 mg daily, Xanax 0.5 mg 3 times a day.  Discussed medication side effects and benefits. Patient is seeing neurologist for his memory impairment.  Discuss safety plan that anytime having active suicidal thoughts or homicidal thoughts then he need to call 911 or go to a local emergency room.  Follow-up in 3 months.    ARFEEN,SYED T., MD 01/16/2015

## 2015-01-24 IMAGING — CR DG CHEST 1V PORT
1 series · 1 of 1 positions shown · non-contrast
Comparison: Chest radiograph March 29, 2013

CLINICAL DATA: Syncopal episode.

EXAM:
PORTABLE CHEST - 1 VIEW

[portable]
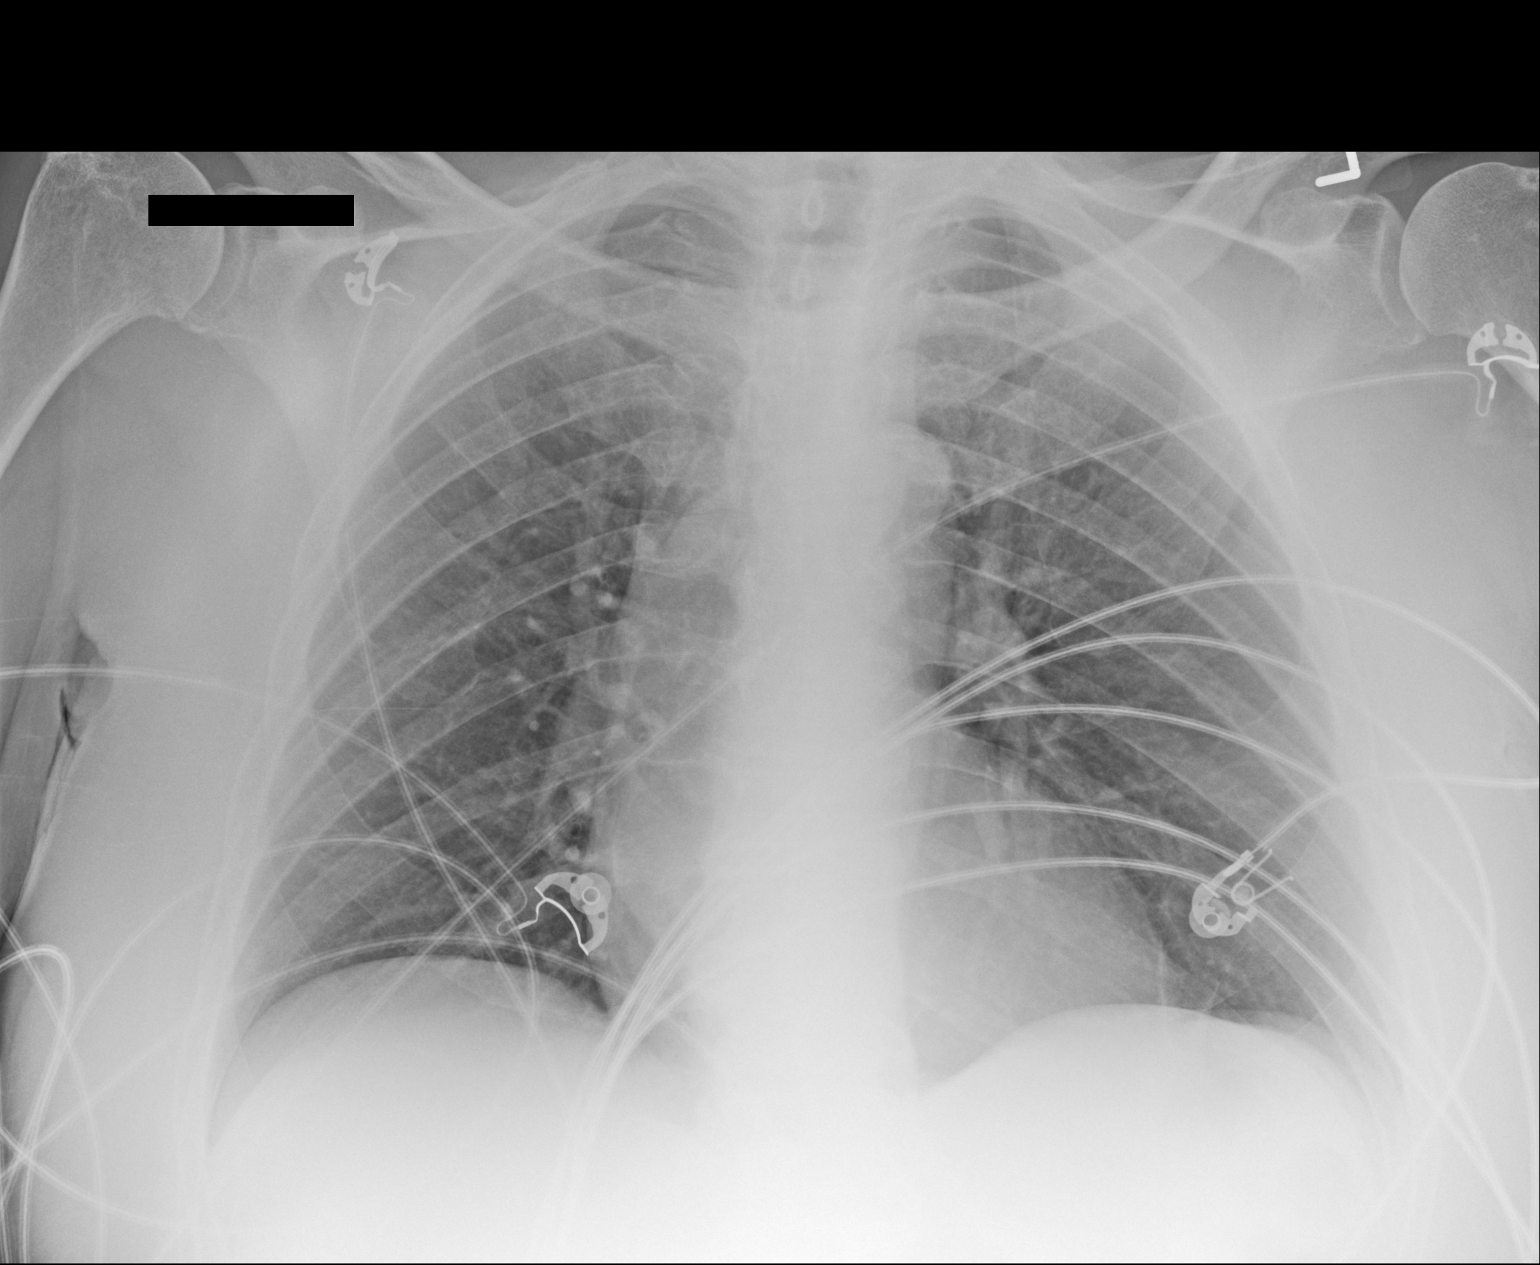

[1 of 1 positions shown; findings below may reference images not displayed]

FINDINGS: The cardiac silhouette is upper limits of normal in size,
mediastinal silhouette is unremarkable. Minimal left lung base
atelectasis versus scarring. The lungs are otherwise clear without
pleural effusions or focal consolidations. Trachea projects midline
and there is no pneumothorax. Soft tissue planes and included
osseous structures are non-suspicious. Moderate degenerative change
of thoracic spine.
IMPRESSION: Minimal left lung base atelectasis versus scarring.

Borderline cardiomegaly.

  By: Rawshanbek Lesbek

## 2015-02-07 ENCOUNTER — Encounter (HOSPITAL_COMMUNITY): Payer: Self-pay | Admitting: Clinical

## 2015-02-07 ENCOUNTER — Ambulatory Visit (INDEPENDENT_AMBULATORY_CARE_PROVIDER_SITE_OTHER): Payer: Commercial Managed Care - HMO | Admitting: Clinical

## 2015-02-07 DIAGNOSIS — F331 Major depressive disorder, recurrent, moderate: Secondary | ICD-10-CM

## 2015-02-07 NOTE — Progress Notes (Signed)
   THERAPIST PROGRESS NOTE  Session Time: 4:03 -4:32  Participation Level: Active  Behavioral Response: CasualAlertDepressed  Type of Therapy: Individual Therapy  Treatment Goals addressed: improve psychiatric symptoms, elevate mood  Interventions: Motivational Interviewing  Summary: Gerald [name] is a 71 y.o. male who presents with Major Depressive Disorder, recurrent, moderate .   Suicidal/Homicidal: Nowithout intent/plan    Therapist Response: Gerald [name] met with clinician for an individual session. Gerald [name]'s Wife joined the session. Jill discussed his psychiatric symptoms and his current life events. Gualberto shared that he continues to be depressed. He shared that he did not make any of the changes he talked about in last session. Gerald [name] shared his thoughts about why he had not made the changes, Gerald [name]'s wife shared her thoughts on the matter. Clinician asked open ended questions and Gerald [name] formulated a better plan for getting to bed earlier and go to the gym early ( he has not returned to gym yet). Gerald [name] shared with his wife some of the difficulty he is having because of the shift of power - she makes more of the decisions etc and he feels excluded (increases his depression). Gerald [name] and clinician discussed  What would make him feel included. His wife agreed to some of thee things that would help him feel included.    Plan: Return again in 1-2 weeks.  Diagnosis:Axis I: Major Depressive Disorder, recurrent, moderate   Kristee Angus A, LCSW 02/07/2015

## 2015-02-08 ENCOUNTER — Ambulatory Visit (INDEPENDENT_AMBULATORY_CARE_PROVIDER_SITE_OTHER): Payer: Commercial Managed Care - HMO | Admitting: Neurology

## 2015-02-08 ENCOUNTER — Encounter: Payer: Self-pay | Admitting: Neurology

## 2015-02-08 VITALS — BP 124/69 | HR 87 | Ht 66.5 in | Wt 216.0 lb

## 2015-02-08 DIAGNOSIS — R413 Other amnesia: Secondary | ICD-10-CM | POA: Diagnosis not present

## 2015-02-08 DIAGNOSIS — R269 Unspecified abnormalities of gait and mobility: Secondary | ICD-10-CM | POA: Diagnosis not present

## 2015-02-08 NOTE — Progress Notes (Signed)
Reason for visit: Memory disturbance  Gerald [name] is an 70 y.o. male  History of present illness:  Gerald [name] is a 70 year old right-handed black male with a history of underlying psychiatric disease with depression. The patient has had behavioral outbursts in the past, no severe episodes since last seen in September 2016. He has undergone MRI of the brain that shows very minimal white matter changes, blood work has been unremarkable. He has been sent for neuropsychological evaluation that showed minimal changes in memory and executive function from a prior study done 5 years ago. The study suggested that his underlying depressive issues were the main etiology of his cognitive complaints. The patient has complete alteration in the sleep wake cycle. He will stay awake until 6:30 AM, sleep all day long, then get up around 5:30 in the evening. He is not exercising any. He has significant hip issues, particular on the left with a leg length discrepancy, shorter on the left. He has fallen on occasion. He returns for an evaluation.  Past Medical History  Diagnosis Date  . Depression   . Hypertension   . Diabetes mellitus without complication (Gerald [name])     controlled by diet  . Fatigue   . DOE (dyspnea on exertion)     Echo 04 /22/2014 - normal EF, mild LVH, grade 1 Diastolic Dysfunction; Lexiscan Myoview 04/28/12 - no ishcemia or infarction, LBBB septal wall motion; EF ~60%  . Bilateral edema of lower extremity     LE Dopplers 4/22 - no thrombus or thrombophelbitis, no reflux  . Scrotal edema   . Osteoarthritis     with bilateral hip surgeries and multiple complication, with the most recent  surgery in January 2012  . H/O iron deficiency anemia     with  hemmorrhoids  . Anxiety   . Left bundle branch block      chronic, negative Myoview a normal echo as noted above   . Hyperlipidemia   . GERD (gastroesophageal reflux disease)   . DJD (degenerative joint disease)   . DDD (degenerative  disc disease)   . Vitamin D deficiency   . Colon polyp   . Abnormality of gait 10/05/2014    Past Surgical History  Procedure Laterality Date  . Hip surgery  2004  . Cardiology nuclear med study   04 24 2014    overall impression ; NORMAL  STRESS  NUCLEAR STUDY  . Venous duplex  04 22 2014    LOWER EXTREMITY SWELLING -Impressions Gerald [name] ; This is a normal bilateral  lower extremity venous duplex Doppler evaluation    Family History  Problem Relation Age of Onset  . Dementia Mother   . Hypertension Mother   . Heart disease Mother   . Diabetes Mother   . Heart attack Maternal Grandmother   . Diabetes Maternal Grandfather     Social history:  reports that he quit smoking about 31 years ago. He has never used smokeless tobacco. He reports that he drinks about 0.6 oz of alcohol per week. He reports that he does not use illicit drugs.   No Known Allergies  Medications:  Prior to Admission medications   Medication Sig Start Date End Date Taking? Authorizing Provider  ALPRAZolam Duanne Moron) 0.5 MG tablet Take 1 tablet (0.5 mg total) by mouth 3 (three) times daily as needed for anxiety. 01/16/15 01/16/16  Kathlee Nations, MD  aspirin 81 MG tablet Take 81 mg by mouth daily.    Historical Provider,  MD  bisoprolol-hydrochlorothiazide (ZIAC) 5-6.25 MG per tablet Take 0.5 tablets by mouth daily. 01/12/13   Unk Pinto, MD  buPROPion (WELLBUTRIN XL) 150 MG 24 hr tablet TAKE 1 TABLET (150 MG TOTAL) BY MOUTH EVERY MORNING. 01/16/15   Kathlee Nations, MD  Cholecalciferol (VITAMIN D3) 5000 UNITS CAPS Take 5,000 Units by mouth daily.     Historical Provider, MD  doxazosin (CARDURA) 8 MG tablet Take 1 tablet (8 mg total) by mouth daily. 03/24/13   Unk Pinto, MD  enalapril (VASOTEC) 20 MG tablet Take 20 mg by mouth 2 (two) times daily.    Historical Provider, MD  Ferrous Sulfate (IRON) 325 (65 FE) MG TABS Take 65 mg by mouth daily.     Historical Provider, MD  Fiber, Guar Gum, CHEW Chew 1 capsule by  mouth daily.    Historical Provider, MD  FLUoxetine (PROZAC) 40 MG capsule Take 1 capsule (40 mg total) by mouth daily. 01/16/15   Kathlee Nations, MD  furosemide (LASIX) 40 MG tablet Take 1 tablet (40 mg total) by mouth 2 (two) times daily. Patient not taking: Reported on 11/29/2014 03/24/13   Unk Pinto, MD  Magnesium 250 MG TABS Take 1 tablet by mouth daily.     Historical Provider, MD  Melatonin 5 MG SUBL Place 5 mg under the tongue at bedtime as needed (sleep).     Historical Provider, MD  meloxicam (MOBIC) 15 MG tablet Take 1 tablet (15 mg total) by mouth daily. 01/12/13   Unk Pinto, MD  Multiple Vitamin (MULTIVITAMIN WITH MINERALS) TABS tablet Take 1 tablet by mouth daily.    Historical Provider, MD  nystatin (MYCOSTATIN/NYSTOP) 100000 UNIT/GM POWD Apply 1 g topically 2 (two) times daily as needed (rash).    Historical Provider, MD  omeprazole (PRILOSEC) 40 MG capsule Take 1 capsule (40 mg total) by mouth daily. 01/12/13   Unk Pinto, MD  polyethylene glycol [facility] / Floria Raveling) packet Take 17 g by mouth daily.     Historical Provider, MD  QUEtiapine (SEROQUEL) 200 MG tablet Take 1 tablet (200 mg total) by mouth at bedtime. 01/16/15 01/17/16  Kathlee Nations, MD  Sennosides (EX-LAX PO) Take 1 tablet by mouth daily as needed (constipation).    Historical Provider, MD  vitamin B-12 (CYANOCOBALAMIN) 1000 MCG tablet Take 1,000 mcg by mouth daily.    Historical Provider, MD  ZINC OXIDE, TOPICAL, (DIAPER RASH) 10 % CREA Apply 1 application topically as needed (for rash).    Historical Provider, MD    ROS:  Out of a complete 14 system review of symptoms, the patient complains only of the following symptoms, and all other reviewed systems are negative.  Memory disturbance Gait disturbance  Blood pressure 124/69, pulse 87, height 5' 6.5" (1.689 m), weight 216 lb (97.977 kg).  Physical Exam  General: The patient is alert and cooperative at the time of the examination.  Skin: No  significant peripheral edema is noted.   Neurologic Exam  Mental status: The patient is alert and oriented x 3 at the time of the examination. The patient has apparent normal recent and remote memory, with an apparently normal attention span and concentration ability. Mini-Mental Status Examination done today shows a total score of 30/30. The patient is able to name 7 animals in 30 seconds.   Cranial nerves: Facial symmetry is present. Speech is normal, no aphasia or dysarthria is noted. Extraocular movements are full. Visual fields are full.  Motor: The patient has good strength in  all 4 extremities, with exception some weakness with hip flexion bilaterally, left greater than right.  Sensory examination: Soft touch sensation is symmetric on the face, arms, and legs.  Coordination: The patient has good finger-nose-finger and heel-to-shin bilaterally.  Gait and station: The patient has a limping gait with the left leg, the patient usually walks with a cane. Tandem gait was not attempted. Romberg is negative. No drift is seen.  Reflexes: Deep tendon reflexes are symmetric.   MRI brain 10/20/14:  IMPRESSION: Slightly abnormal MRI scan of the brain showing mild degree of generalized cerebral atrophy and chronic microvascular ischemic changes. No significant change compared with CT head 09/26/2014  * MRI scan images were reviewed online. I agree with the written report.    Assessment/Plan:  1. Depression  2. Minimum cognitive impairment  3. Diurnal rhythm disorder  The patient continues to have mild memory issues, neuropsychological testing procedures have not shown evidence of progression as one would expect with frontotemporal dementia or Alzheimer's disease. The patient has significant alterations in his diurnal rhythm, sleeping during the day, staying awake at night. This may have some impact on his ability to function cognitively during the daytime. At this point, no indication  for treatment for the memory, I will see the patient back on an as-needed basis.  Jill Alexanders MD 02/08/2015 12:24 PM  [facility] [address] [place], [place] [address]  Phone [phone] Fax [phone]

## 2015-02-08 NOTE — Patient Instructions (Signed)
Fall Prevention in the Home  Falls can cause injuries and can affect people from all age groups. There are many simple things that you can do to make your home safe and to help prevent falls. WHAT CAN I DO ON THE OUTSIDE OF MY HOME?  Regularly repair the edges of walkways and driveways and fix any cracks.  Remove high doorway thresholds.  Trim any shrubbery on the main path into your home.  Use bright outdoor lighting.  Clear walkways of debris and clutter, including tools and rocks.  Regularly check that handrails are securely fastened and in good repair. Both sides of any steps should have handrails.  Install guardrails along the edges of any raised decks or porches.  Have leaves, snow, and ice cleared regularly.  Use sand or salt on walkways during winter months.  In the garage, clean up any spills right away, including grease or oil spills. WHAT CAN I DO IN THE BATHROOM?  Use night lights.  Install grab bars by the toilet and in the tub and shower. Do not use towel bars as grab bars.  Use non-skid mats or decals on the floor of the tub or shower.  If you need to sit down while you are in the shower, use a plastic, non-slip stool..  Keep the floor dry. Immediately clean up any water that spills on the floor.  Remove soap buildup in the tub or shower on a regular basis.  Attach bath mats securely with double-sided non-slip rug tape.  Remove throw rugs and other tripping hazards from the floor. WHAT CAN I DO IN THE BEDROOM?  Use night lights.  Make sure that a bedside light is easy to reach.  Do not use oversized bedding that drapes onto the floor.  Have a firm chair that has side arms to use for getting dressed.  Remove throw rugs and other tripping hazards from the floor. WHAT CAN I DO IN THE KITCHEN?   Clean up any spills right away.  Avoid walking on wet floors.  Place frequently used items in easy-to-reach places.  If you need to reach for something  above you, use a sturdy step stool that has a grab bar.  Keep electrical cables out of the way.  Do not use floor polish or wax that makes floors slippery. If you have to use wax, make sure that it is non-skid floor wax.  Remove throw rugs and other tripping hazards from the floor. WHAT CAN I DO IN THE STAIRWAYS?  Do not leave any items on the stairs.  Make sure that there are handrails on both sides of the stairs. Fix handrails that are broken or loose. Make sure that handrails are as long as the stairways.  Check any carpeting to make sure that it is firmly attached to the stairs. Fix any carpet that is loose or worn.  Avoid having throw rugs at the top or bottom of stairways, or secure the rugs with carpet tape to prevent them from moving.  Make sure that you have a light switch at the top of the stairs and the bottom of the stairs. If you do not have them, have them installed. WHAT ARE SOME OTHER FALL PREVENTION TIPS?  Wear closed-toe shoes that fit well and support your feet. Wear shoes that have rubber soles or low heels.  When you use a stepladder, make sure that it is completely opened and that the sides are firmly locked. Have someone hold the ladder while you   are using it. Do not climb a closed stepladder.  Add color or contrast paint or tape to grab bars and handrails in your home. Place contrasting color strips on the first and last steps.  Use mobility aids as needed, such as canes, walkers, scooters, and crutches.  Turn on lights if it is dark. Replace any light bulbs that burn out.  Set up furniture so that there are clear paths. Keep the furniture in the same spot.  Fix any uneven floor surfaces.  Choose a carpet design that does not hide the edge of steps of a stairway.  Be aware of any and all pets.  Review your medicines with your healthcare provider. Some medicines can cause dizziness or changes in blood pressure, which increase your risk of falling. Talk  with your health care provider about other ways that you can decrease your risk of falls. This may include working with a physical therapist or trainer to improve your strength, balance, and endurance.   This information is not intended to replace advice given to you by your health care provider. Make sure you discuss any questions you have with your health care provider.   Document Released: 12/13/2001 Document Revised: 05/09/2014 Document Reviewed: 01/27/2014 Elsevier Interactive Patient Education 2016 [employer]  

## 2015-02-27 ENCOUNTER — Telehealth: Payer: Self-pay | Admitting: Neurology

## 2015-02-27 NOTE — Telephone Encounter (Signed)
I called the patient's wife. She feels like the patient was able to "fool" Dr. Jannifer Franklin and Dr. Valentina Shaggy. She feels like he is much worse than both doctors wrote in their reports. She states that Dr. Valentina Shaggy "took it easy" on the patient because he had only had 3 hours of sleep the night before. She feels like that was the patient's fault though and that Dr. Valentina Shaggy shouldn't have taken it easy on him. She states he wants to spend all of his social security check and sometimes he gets aggressive towards her. She is afraid of him. She would like to talk to Dr. Jannifer Franklin about this. She feels like he needs to hear her side/feelings. She states the patient has a family history of Alzheimer's disease.

## 2015-02-27 NOTE — Telephone Encounter (Signed)
I had a long discussion with the wife, she is mainly concerned about the patient's behavior, she does not feel safe around him. 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 The patient is followed by psychiatrist, he needs to continue to follow-up with this physician.

## 2015-02-27 NOTE — Telephone Encounter (Signed)
Wife called, wants to talk to someone about the diagnosis Dr. Jannifer Franklin gave about husband. Wife doesn't like the diagnosis Dr. Jannifer Franklin and Dr. Valentina Shaggy gave, states husband can manipulate Doctor's.

## 2015-03-07 DIAGNOSIS — E1122 Type 2 diabetes mellitus with diabetic chronic kidney disease: Secondary | ICD-10-CM | POA: Diagnosis not present

## 2015-03-07 DIAGNOSIS — E538 Deficiency of other specified B group vitamins: Secondary | ICD-10-CM | POA: Diagnosis not present

## 2015-03-07 DIAGNOSIS — N400 Enlarged prostate without lower urinary tract symptoms: Secondary | ICD-10-CM | POA: Diagnosis not present

## 2015-03-07 DIAGNOSIS — I5032 Chronic diastolic (congestive) heart failure: Secondary | ICD-10-CM | POA: Diagnosis not present

## 2015-03-07 DIAGNOSIS — E559 Vitamin D deficiency, unspecified: Secondary | ICD-10-CM | POA: Diagnosis not present

## 2015-03-14 ENCOUNTER — Other Ambulatory Visit (HOSPITAL_COMMUNITY): Payer: Self-pay | Admitting: Psychiatry

## 2015-03-14 DIAGNOSIS — F339 Major depressive disorder, recurrent, unspecified: Secondary | ICD-10-CM | POA: Diagnosis not present

## 2015-03-14 DIAGNOSIS — I5032 Chronic diastolic (congestive) heart failure: Secondary | ICD-10-CM | POA: Diagnosis not present

## 2015-03-14 DIAGNOSIS — D696 Thrombocytopenia, unspecified: Secondary | ICD-10-CM | POA: Diagnosis not present

## 2015-03-14 DIAGNOSIS — E1122 Type 2 diabetes mellitus with diabetic chronic kidney disease: Secondary | ICD-10-CM | POA: Diagnosis not present

## 2015-03-19 ENCOUNTER — Ambulatory Visit (HOSPITAL_COMMUNITY): Payer: Self-pay | Admitting: Clinical

## 2015-03-30 DIAGNOSIS — F0390 Unspecified dementia without behavioral disturbance: Secondary | ICD-10-CM | POA: Diagnosis not present

## 2015-04-12 ENCOUNTER — Ambulatory Visit (HOSPITAL_COMMUNITY): Payer: Self-pay | Admitting: Psychiatry

## 2015-04-13 DIAGNOSIS — F330 Major depressive disorder, recurrent, mild: Secondary | ICD-10-CM | POA: Diagnosis not present

## 2015-04-17 DIAGNOSIS — F330 Major depressive disorder, recurrent, mild: Secondary | ICD-10-CM | POA: Diagnosis not present

## 2015-04-30 DIAGNOSIS — F330 Major depressive disorder, recurrent, mild: Secondary | ICD-10-CM | POA: Diagnosis not present

## 2015-05-02 ENCOUNTER — Other Ambulatory Visit (HOSPITAL_COMMUNITY): Payer: Self-pay | Admitting: Psychiatry

## 2015-05-16 DIAGNOSIS — F330 Major depressive disorder, recurrent, mild: Secondary | ICD-10-CM | POA: Diagnosis not present

## 2015-05-21 DIAGNOSIS — F330 Major depressive disorder, recurrent, mild: Secondary | ICD-10-CM | POA: Diagnosis not present

## 2015-05-22 DIAGNOSIS — H40023 Open angle with borderline findings, high risk, bilateral: Secondary | ICD-10-CM | POA: Diagnosis not present

## 2015-05-24 ENCOUNTER — Encounter: Payer: Self-pay | Admitting: Gastroenterology

## 2015-05-31 DIAGNOSIS — E559 Vitamin D deficiency, unspecified: Secondary | ICD-10-CM | POA: Diagnosis not present

## 2015-05-31 DIAGNOSIS — E538 Deficiency of other specified B group vitamins: Secondary | ICD-10-CM | POA: Diagnosis not present

## 2015-05-31 DIAGNOSIS — E1122 Type 2 diabetes mellitus with diabetic chronic kidney disease: Secondary | ICD-10-CM | POA: Diagnosis not present

## 2015-05-31 DIAGNOSIS — I5032 Chronic diastolic (congestive) heart failure: Secondary | ICD-10-CM | POA: Diagnosis not present

## 2015-06-07 DIAGNOSIS — E1122 Type 2 diabetes mellitus with diabetic chronic kidney disease: Secondary | ICD-10-CM | POA: Diagnosis not present

## 2015-06-07 DIAGNOSIS — D696 Thrombocytopenia, unspecified: Secondary | ICD-10-CM | POA: Diagnosis not present

## 2015-06-07 DIAGNOSIS — F17211 Nicotine dependence, cigarettes, in remission: Secondary | ICD-10-CM | POA: Diagnosis not present

## 2015-06-07 DIAGNOSIS — F339 Major depressive disorder, recurrent, unspecified: Secondary | ICD-10-CM | POA: Diagnosis not present

## 2015-06-07 DIAGNOSIS — I5032 Chronic diastolic (congestive) heart failure: Secondary | ICD-10-CM | POA: Diagnosis not present

## 2015-06-14 DIAGNOSIS — M25552 Pain in left hip: Secondary | ICD-10-CM | POA: Diagnosis not present

## 2015-06-26 DIAGNOSIS — N183 Chronic kidney disease, stage 3 (moderate): Secondary | ICD-10-CM | POA: Diagnosis not present

## 2015-06-27 DIAGNOSIS — F330 Major depressive disorder, recurrent, mild: Secondary | ICD-10-CM | POA: Diagnosis not present

## 2015-07-11 ENCOUNTER — Other Ambulatory Visit (HOSPITAL_COMMUNITY): Payer: Self-pay | Admitting: Psychiatry

## 2015-07-12 DIAGNOSIS — R413 Other amnesia: Secondary | ICD-10-CM | POA: Diagnosis not present

## 2015-07-12 DIAGNOSIS — G479 Sleep disorder, unspecified: Secondary | ICD-10-CM | POA: Diagnosis not present

## 2015-07-12 DIAGNOSIS — G4710 Hypersomnia, unspecified: Secondary | ICD-10-CM | POA: Diagnosis not present

## 2015-08-07 DIAGNOSIS — F330 Major depressive disorder, recurrent, mild: Secondary | ICD-10-CM | POA: Diagnosis not present

## 2015-08-10 DIAGNOSIS — G4710 Hypersomnia, unspecified: Secondary | ICD-10-CM | POA: Diagnosis not present

## 2015-08-10 DIAGNOSIS — I771 Stricture of artery: Secondary | ICD-10-CM | POA: Diagnosis not present

## 2015-08-10 DIAGNOSIS — Z87891 Personal history of nicotine dependence: Secondary | ICD-10-CM | POA: Diagnosis not present

## 2015-08-10 DIAGNOSIS — E119 Type 2 diabetes mellitus without complications: Secondary | ICD-10-CM | POA: Diagnosis not present

## 2015-08-10 DIAGNOSIS — R413 Other amnesia: Secondary | ICD-10-CM | POA: Diagnosis not present

## 2015-08-10 DIAGNOSIS — I10 Essential (primary) hypertension: Secondary | ICD-10-CM | POA: Diagnosis not present

## 2015-09-06 DIAGNOSIS — I129 Hypertensive chronic kidney disease with stage 1 through stage 4 chronic kidney disease, or unspecified chronic kidney disease: Secondary | ICD-10-CM | POA: Diagnosis not present

## 2015-09-06 DIAGNOSIS — E559 Vitamin D deficiency, unspecified: Secondary | ICD-10-CM | POA: Diagnosis not present

## 2015-09-06 DIAGNOSIS — E1122 Type 2 diabetes mellitus with diabetic chronic kidney disease: Secondary | ICD-10-CM | POA: Diagnosis not present

## 2015-09-06 DIAGNOSIS — E538 Deficiency of other specified B group vitamins: Secondary | ICD-10-CM | POA: Diagnosis not present

## 2015-09-13 DIAGNOSIS — G4733 Obstructive sleep apnea (adult) (pediatric): Secondary | ICD-10-CM | POA: Diagnosis not present

## 2015-09-13 DIAGNOSIS — E1122 Type 2 diabetes mellitus with diabetic chronic kidney disease: Secondary | ICD-10-CM | POA: Diagnosis not present

## 2015-09-13 DIAGNOSIS — F17211 Nicotine dependence, cigarettes, in remission: Secondary | ICD-10-CM | POA: Diagnosis not present

## 2015-09-13 DIAGNOSIS — I5032 Chronic diastolic (congestive) heart failure: Secondary | ICD-10-CM | POA: Diagnosis not present

## 2015-09-13 DIAGNOSIS — I129 Hypertensive chronic kidney disease with stage 1 through stage 4 chronic kidney disease, or unspecified chronic kidney disease: Secondary | ICD-10-CM | POA: Diagnosis not present

## 2015-09-13 DIAGNOSIS — Z6832 Body mass index (BMI) 32.0-32.9, adult: Secondary | ICD-10-CM | POA: Diagnosis not present

## 2015-09-13 DIAGNOSIS — F339 Major depressive disorder, recurrent, unspecified: Secondary | ICD-10-CM | POA: Diagnosis not present

## 2015-09-13 DIAGNOSIS — I10 Essential (primary) hypertension: Secondary | ICD-10-CM | POA: Diagnosis not present

## 2015-09-13 DIAGNOSIS — Z23 Encounter for immunization: Secondary | ICD-10-CM | POA: Diagnosis not present

## 2015-10-09 DIAGNOSIS — F330 Major depressive disorder, recurrent, mild: Secondary | ICD-10-CM | POA: Diagnosis not present

## 2015-11-12 DIAGNOSIS — E785 Hyperlipidemia, unspecified: Secondary | ICD-10-CM | POA: Diagnosis not present

## 2015-11-12 DIAGNOSIS — I10 Essential (primary) hypertension: Secondary | ICD-10-CM | POA: Diagnosis not present

## 2015-11-12 DIAGNOSIS — Z87820 Personal history of traumatic brain injury: Secondary | ICD-10-CM | POA: Diagnosis not present

## 2015-11-12 DIAGNOSIS — E119 Type 2 diabetes mellitus without complications: Secondary | ICD-10-CM | POA: Diagnosis not present

## 2015-11-12 DIAGNOSIS — G3184 Mild cognitive impairment, so stated: Secondary | ICD-10-CM | POA: Diagnosis not present

## 2015-11-12 DIAGNOSIS — G4733 Obstructive sleep apnea (adult) (pediatric): Secondary | ICD-10-CM | POA: Diagnosis not present

## 2015-11-12 DIAGNOSIS — Z7982 Long term (current) use of aspirin: Secondary | ICD-10-CM | POA: Diagnosis not present

## 2015-11-12 DIAGNOSIS — Z6831 Body mass index (BMI) 31.0-31.9, adult: Secondary | ICD-10-CM | POA: Diagnosis not present

## 2015-11-15 DIAGNOSIS — G4733 Obstructive sleep apnea (adult) (pediatric): Secondary | ICD-10-CM | POA: Insufficient documentation

## 2015-11-20 DIAGNOSIS — E119 Type 2 diabetes mellitus without complications: Secondary | ICD-10-CM | POA: Diagnosis not present

## 2015-11-20 DIAGNOSIS — I10 Essential (primary) hypertension: Secondary | ICD-10-CM | POA: Diagnosis not present

## 2015-11-20 DIAGNOSIS — G4733 Obstructive sleep apnea (adult) (pediatric): Secondary | ICD-10-CM | POA: Diagnosis not present

## 2015-11-20 DIAGNOSIS — Z9989 Dependence on other enabling machines and devices: Secondary | ICD-10-CM | POA: Diagnosis not present

## 2015-12-06 ENCOUNTER — Other Ambulatory Visit (HOSPITAL_COMMUNITY): Payer: Self-pay | Admitting: Psychiatry

## 2015-12-06 DIAGNOSIS — G4733 Obstructive sleep apnea (adult) (pediatric): Secondary | ICD-10-CM | POA: Diagnosis not present

## 2015-12-06 DIAGNOSIS — F331 Major depressive disorder, recurrent, moderate: Secondary | ICD-10-CM

## 2015-12-13 DIAGNOSIS — I129 Hypertensive chronic kidney disease with stage 1 through stage 4 chronic kidney disease, or unspecified chronic kidney disease: Secondary | ICD-10-CM | POA: Diagnosis not present

## 2015-12-13 DIAGNOSIS — E1122 Type 2 diabetes mellitus with diabetic chronic kidney disease: Secondary | ICD-10-CM | POA: Diagnosis not present

## 2015-12-13 DIAGNOSIS — E559 Vitamin D deficiency, unspecified: Secondary | ICD-10-CM | POA: Diagnosis not present

## 2015-12-13 DIAGNOSIS — E785 Hyperlipidemia, unspecified: Secondary | ICD-10-CM | POA: Diagnosis not present

## 2015-12-13 DIAGNOSIS — Z0000 Encounter for general adult medical examination without abnormal findings: Secondary | ICD-10-CM | POA: Diagnosis not present

## 2015-12-13 DIAGNOSIS — E538 Deficiency of other specified B group vitamins: Secondary | ICD-10-CM | POA: Diagnosis not present

## 2015-12-17 DIAGNOSIS — H35362 Drusen (degenerative) of macula, left eye: Secondary | ICD-10-CM | POA: Diagnosis not present

## 2015-12-17 DIAGNOSIS — E119 Type 2 diabetes mellitus without complications: Secondary | ICD-10-CM | POA: Diagnosis not present

## 2015-12-17 DIAGNOSIS — Z961 Presence of intraocular lens: Secondary | ICD-10-CM | POA: Diagnosis not present

## 2015-12-17 DIAGNOSIS — H40023 Open angle with borderline findings, high risk, bilateral: Secondary | ICD-10-CM | POA: Diagnosis not present

## 2015-12-18 DIAGNOSIS — I5032 Chronic diastolic (congestive) heart failure: Secondary | ICD-10-CM | POA: Diagnosis not present

## 2015-12-18 DIAGNOSIS — F339 Major depressive disorder, recurrent, unspecified: Secondary | ICD-10-CM | POA: Diagnosis not present

## 2015-12-18 DIAGNOSIS — E1122 Type 2 diabetes mellitus with diabetic chronic kidney disease: Secondary | ICD-10-CM | POA: Diagnosis not present

## 2015-12-18 DIAGNOSIS — Z0000 Encounter for general adult medical examination without abnormal findings: Secondary | ICD-10-CM | POA: Diagnosis not present

## 2016-01-05 DIAGNOSIS — G4733 Obstructive sleep apnea (adult) (pediatric): Secondary | ICD-10-CM | POA: Diagnosis not present

## 2016-01-08 DIAGNOSIS — F330 Major depressive disorder, recurrent, mild: Secondary | ICD-10-CM | POA: Diagnosis not present

## 2016-01-29 DIAGNOSIS — Z1212 Encounter for screening for malignant neoplasm of rectum: Secondary | ICD-10-CM | POA: Diagnosis not present

## 2016-01-29 DIAGNOSIS — Z1211 Encounter for screening for malignant neoplasm of colon: Secondary | ICD-10-CM | POA: Diagnosis not present

## 2016-02-05 DIAGNOSIS — G4733 Obstructive sleep apnea (adult) (pediatric): Secondary | ICD-10-CM | POA: Diagnosis not present

## 2016-02-21 DIAGNOSIS — R413 Other amnesia: Secondary | ICD-10-CM | POA: Diagnosis not present

## 2016-02-21 DIAGNOSIS — G4733 Obstructive sleep apnea (adult) (pediatric): Secondary | ICD-10-CM | POA: Diagnosis not present

## 2016-02-21 DIAGNOSIS — G3184 Mild cognitive impairment, so stated: Secondary | ICD-10-CM | POA: Diagnosis not present

## 2016-03-05 DIAGNOSIS — G4733 Obstructive sleep apnea (adult) (pediatric): Secondary | ICD-10-CM | POA: Diagnosis not present

## 2016-03-11 DIAGNOSIS — E559 Vitamin D deficiency, unspecified: Secondary | ICD-10-CM | POA: Diagnosis not present

## 2016-03-11 DIAGNOSIS — I129 Hypertensive chronic kidney disease with stage 1 through stage 4 chronic kidney disease, or unspecified chronic kidney disease: Secondary | ICD-10-CM | POA: Diagnosis not present

## 2016-03-11 DIAGNOSIS — E1122 Type 2 diabetes mellitus with diabetic chronic kidney disease: Secondary | ICD-10-CM | POA: Diagnosis not present

## 2016-03-11 DIAGNOSIS — E785 Hyperlipidemia, unspecified: Secondary | ICD-10-CM | POA: Diagnosis not present

## 2016-03-24 DIAGNOSIS — E119 Type 2 diabetes mellitus without complications: Secondary | ICD-10-CM | POA: Diagnosis not present

## 2016-03-24 DIAGNOSIS — G4733 Obstructive sleep apnea (adult) (pediatric): Secondary | ICD-10-CM | POA: Diagnosis not present

## 2016-03-24 DIAGNOSIS — Z79899 Other long term (current) drug therapy: Secondary | ICD-10-CM | POA: Diagnosis not present

## 2016-03-24 DIAGNOSIS — G3184 Mild cognitive impairment, so stated: Secondary | ICD-10-CM | POA: Diagnosis not present

## 2016-03-24 DIAGNOSIS — G478 Other sleep disorders: Secondary | ICD-10-CM | POA: Diagnosis not present

## 2016-03-24 DIAGNOSIS — Z9119 Patient's noncompliance with other medical treatment and regimen: Secondary | ICD-10-CM | POA: Diagnosis not present

## 2016-03-24 DIAGNOSIS — G479 Sleep disorder, unspecified: Secondary | ICD-10-CM | POA: Diagnosis not present

## 2016-03-28 DIAGNOSIS — F339 Major depressive disorder, recurrent, unspecified: Secondary | ICD-10-CM | POA: Diagnosis not present

## 2016-03-28 DIAGNOSIS — E1122 Type 2 diabetes mellitus with diabetic chronic kidney disease: Secondary | ICD-10-CM | POA: Diagnosis not present

## 2016-03-28 DIAGNOSIS — I5032 Chronic diastolic (congestive) heart failure: Secondary | ICD-10-CM | POA: Diagnosis not present

## 2016-03-28 DIAGNOSIS — I129 Hypertensive chronic kidney disease with stage 1 through stage 4 chronic kidney disease, or unspecified chronic kidney disease: Secondary | ICD-10-CM | POA: Diagnosis not present

## 2016-04-01 ENCOUNTER — Other Ambulatory Visit (HOSPITAL_COMMUNITY): Payer: Self-pay | Admitting: Psychiatry

## 2016-04-01 DIAGNOSIS — F331 Major depressive disorder, recurrent, moderate: Secondary | ICD-10-CM

## 2016-04-08 DIAGNOSIS — F330 Major depressive disorder, recurrent, mild: Secondary | ICD-10-CM | POA: Diagnosis not present

## 2016-04-16 DIAGNOSIS — R413 Other amnesia: Secondary | ICD-10-CM | POA: Diagnosis not present

## 2016-06-19 DIAGNOSIS — H40023 Open angle with borderline findings, high risk, bilateral: Secondary | ICD-10-CM | POA: Diagnosis not present

## 2016-06-24 DIAGNOSIS — I10 Essential (primary) hypertension: Secondary | ICD-10-CM | POA: Diagnosis not present

## 2016-06-24 DIAGNOSIS — E1122 Type 2 diabetes mellitus with diabetic chronic kidney disease: Secondary | ICD-10-CM | POA: Diagnosis not present

## 2016-06-24 DIAGNOSIS — E785 Hyperlipidemia, unspecified: Secondary | ICD-10-CM | POA: Diagnosis not present

## 2016-07-01 DIAGNOSIS — I10 Essential (primary) hypertension: Secondary | ICD-10-CM | POA: Diagnosis not present

## 2016-07-01 DIAGNOSIS — E559 Vitamin D deficiency, unspecified: Secondary | ICD-10-CM | POA: Diagnosis not present

## 2016-07-01 DIAGNOSIS — E7800 Pure hypercholesterolemia, unspecified: Secondary | ICD-10-CM | POA: Diagnosis not present

## 2016-07-01 DIAGNOSIS — E119 Type 2 diabetes mellitus without complications: Secondary | ICD-10-CM | POA: Diagnosis not present

## 2016-07-11 DIAGNOSIS — F330 Major depressive disorder, recurrent, mild: Secondary | ICD-10-CM | POA: Diagnosis not present

## 2016-08-22 DIAGNOSIS — Z7982 Long term (current) use of aspirin: Secondary | ICD-10-CM | POA: Diagnosis not present

## 2016-08-22 DIAGNOSIS — I10 Essential (primary) hypertension: Secondary | ICD-10-CM | POA: Diagnosis not present

## 2016-08-22 DIAGNOSIS — G4730 Sleep apnea, unspecified: Secondary | ICD-10-CM | POA: Diagnosis not present

## 2016-08-22 DIAGNOSIS — R51 Headache: Secondary | ICD-10-CM | POA: Diagnosis not present

## 2016-08-22 DIAGNOSIS — E119 Type 2 diabetes mellitus without complications: Secondary | ICD-10-CM | POA: Diagnosis not present

## 2016-08-22 DIAGNOSIS — E785 Hyperlipidemia, unspecified: Secondary | ICD-10-CM | POA: Diagnosis not present

## 2016-08-22 DIAGNOSIS — F329 Major depressive disorder, single episode, unspecified: Secondary | ICD-10-CM | POA: Diagnosis not present

## 2016-08-22 DIAGNOSIS — G3184 Mild cognitive impairment, so stated: Secondary | ICD-10-CM | POA: Diagnosis not present

## 2016-09-03 DIAGNOSIS — G4486 Cervicogenic headache: Secondary | ICD-10-CM | POA: Insufficient documentation

## 2016-09-23 DIAGNOSIS — M26622 Arthralgia of left temporomandibular joint: Secondary | ICD-10-CM | POA: Diagnosis not present

## 2016-09-23 DIAGNOSIS — M26629 Arthralgia of temporomandibular joint, unspecified side: Secondary | ICD-10-CM | POA: Insufficient documentation

## 2016-09-23 DIAGNOSIS — H9202 Otalgia, left ear: Secondary | ICD-10-CM | POA: Diagnosis not present

## 2016-09-25 DIAGNOSIS — F418 Other specified anxiety disorders: Secondary | ICD-10-CM | POA: Diagnosis not present

## 2016-09-25 DIAGNOSIS — E119 Type 2 diabetes mellitus without complications: Secondary | ICD-10-CM | POA: Diagnosis not present

## 2016-09-25 DIAGNOSIS — E7800 Pure hypercholesterolemia, unspecified: Secondary | ICD-10-CM | POA: Diagnosis not present

## 2016-09-25 DIAGNOSIS — E559 Vitamin D deficiency, unspecified: Secondary | ICD-10-CM | POA: Diagnosis not present

## 2016-10-02 DIAGNOSIS — N183 Chronic kidney disease, stage 3 (moderate): Secondary | ICD-10-CM | POA: Diagnosis not present

## 2016-10-02 DIAGNOSIS — E559 Vitamin D deficiency, unspecified: Secondary | ICD-10-CM | POA: Diagnosis not present

## 2016-10-02 DIAGNOSIS — F418 Other specified anxiety disorders: Secondary | ICD-10-CM | POA: Diagnosis not present

## 2016-10-02 DIAGNOSIS — E119 Type 2 diabetes mellitus without complications: Secondary | ICD-10-CM | POA: Diagnosis not present

## 2016-10-02 DIAGNOSIS — Z23 Encounter for immunization: Secondary | ICD-10-CM | POA: Diagnosis not present

## 2016-10-02 DIAGNOSIS — E7800 Pure hypercholesterolemia, unspecified: Secondary | ICD-10-CM | POA: Diagnosis not present

## 2016-10-02 DIAGNOSIS — N189 Chronic kidney disease, unspecified: Secondary | ICD-10-CM | POA: Diagnosis not present

## 2016-10-02 DIAGNOSIS — I10 Essential (primary) hypertension: Secondary | ICD-10-CM | POA: Diagnosis not present

## 2016-11-21 DIAGNOSIS — I5032 Chronic diastolic (congestive) heart failure: Secondary | ICD-10-CM | POA: Diagnosis not present

## 2016-11-21 DIAGNOSIS — N183 Chronic kidney disease, stage 3 (moderate): Secondary | ICD-10-CM | POA: Diagnosis not present

## 2016-11-21 DIAGNOSIS — R809 Proteinuria, unspecified: Secondary | ICD-10-CM | POA: Diagnosis not present

## 2016-11-21 DIAGNOSIS — F339 Major depressive disorder, recurrent, unspecified: Secondary | ICD-10-CM | POA: Diagnosis not present

## 2016-11-21 DIAGNOSIS — E1122 Type 2 diabetes mellitus with diabetic chronic kidney disease: Secondary | ICD-10-CM | POA: Diagnosis not present

## 2016-11-21 DIAGNOSIS — E559 Vitamin D deficiency, unspecified: Secondary | ICD-10-CM | POA: Diagnosis not present

## 2016-11-21 DIAGNOSIS — I129 Hypertensive chronic kidney disease with stage 1 through stage 4 chronic kidney disease, or unspecified chronic kidney disease: Secondary | ICD-10-CM | POA: Diagnosis not present

## 2016-11-21 DIAGNOSIS — D631 Anemia in chronic kidney disease: Secondary | ICD-10-CM | POA: Diagnosis not present

## 2016-11-25 DIAGNOSIS — N183 Chronic kidney disease, stage 3 (moderate): Secondary | ICD-10-CM | POA: Diagnosis not present

## 2016-11-25 DIAGNOSIS — R809 Proteinuria, unspecified: Secondary | ICD-10-CM | POA: Diagnosis not present

## 2016-11-26 ENCOUNTER — Other Ambulatory Visit: Payer: Self-pay | Admitting: Nephrology

## 2016-11-26 DIAGNOSIS — N183 Chronic kidney disease, stage 3 unspecified: Secondary | ICD-10-CM

## 2016-12-02 ENCOUNTER — Ambulatory Visit
Admission: RE | Admit: 2016-12-02 | Discharge: 2016-12-02 | Disposition: A | Discharge status: Home / Self Care | Payer: Medicare HMO | Source: Ambulatory Visit | Attending: Nephrology | Admitting: Nephrology

## 2016-12-02 DIAGNOSIS — N189 Chronic kidney disease, unspecified: Secondary | ICD-10-CM | POA: Diagnosis not present

## 2016-12-02 DIAGNOSIS — N183 Chronic kidney disease, stage 3 unspecified: Secondary | ICD-10-CM

## 2016-12-11 DIAGNOSIS — E119 Type 2 diabetes mellitus without complications: Secondary | ICD-10-CM | POA: Diagnosis not present

## 2016-12-11 DIAGNOSIS — H35362 Drusen (degenerative) of macula, left eye: Secondary | ICD-10-CM | POA: Diagnosis not present

## 2016-12-11 DIAGNOSIS — H35371 Puckering of macula, right eye: Secondary | ICD-10-CM | POA: Diagnosis not present

## 2016-12-11 DIAGNOSIS — H40023 Open angle with borderline findings, high risk, bilateral: Secondary | ICD-10-CM | POA: Diagnosis not present

## 2016-12-26 DIAGNOSIS — E7800 Pure hypercholesterolemia, unspecified: Secondary | ICD-10-CM | POA: Diagnosis not present

## 2016-12-26 DIAGNOSIS — E119 Type 2 diabetes mellitus without complications: Secondary | ICD-10-CM | POA: Diagnosis not present

## 2016-12-26 DIAGNOSIS — E559 Vitamin D deficiency, unspecified: Secondary | ICD-10-CM | POA: Diagnosis not present

## 2016-12-26 DIAGNOSIS — Z0000 Encounter for general adult medical examination without abnormal findings: Secondary | ICD-10-CM | POA: Diagnosis not present

## 2017-01-02 DIAGNOSIS — J069 Acute upper respiratory infection, unspecified: Secondary | ICD-10-CM | POA: Diagnosis not present

## 2017-01-02 DIAGNOSIS — E785 Hyperlipidemia, unspecified: Secondary | ICD-10-CM | POA: Diagnosis not present

## 2017-01-02 DIAGNOSIS — F329 Major depressive disorder, single episode, unspecified: Secondary | ICD-10-CM | POA: Diagnosis not present

## 2017-01-02 DIAGNOSIS — Z0000 Encounter for general adult medical examination without abnormal findings: Secondary | ICD-10-CM | POA: Diagnosis not present

## 2017-01-02 DIAGNOSIS — I129 Hypertensive chronic kidney disease with stage 1 through stage 4 chronic kidney disease, or unspecified chronic kidney disease: Secondary | ICD-10-CM | POA: Diagnosis not present

## 2017-01-02 DIAGNOSIS — E119 Type 2 diabetes mellitus without complications: Secondary | ICD-10-CM | POA: Diagnosis not present

## 2017-01-02 DIAGNOSIS — N183 Chronic kidney disease, stage 3 (moderate): Secondary | ICD-10-CM | POA: Diagnosis not present

## 2017-01-20 DIAGNOSIS — R4189 Other symptoms and signs involving cognitive functions and awareness: Secondary | ICD-10-CM | POA: Diagnosis not present

## 2017-01-20 DIAGNOSIS — R51 Headache: Secondary | ICD-10-CM | POA: Diagnosis not present

## 2017-01-26 DIAGNOSIS — F330 Major depressive disorder, recurrent, mild: Secondary | ICD-10-CM | POA: Diagnosis not present

## 2017-02-17 DIAGNOSIS — D631 Anemia in chronic kidney disease: Secondary | ICD-10-CM | POA: Diagnosis not present

## 2017-02-17 DIAGNOSIS — N183 Chronic kidney disease, stage 3 (moderate): Secondary | ICD-10-CM | POA: Diagnosis not present

## 2017-02-17 DIAGNOSIS — E1122 Type 2 diabetes mellitus with diabetic chronic kidney disease: Secondary | ICD-10-CM | POA: Diagnosis not present

## 2017-02-17 DIAGNOSIS — I129 Hypertensive chronic kidney disease with stage 1 through stage 4 chronic kidney disease, or unspecified chronic kidney disease: Secondary | ICD-10-CM | POA: Diagnosis not present

## 2017-02-17 DIAGNOSIS — E559 Vitamin D deficiency, unspecified: Secondary | ICD-10-CM | POA: Diagnosis not present

## 2017-02-17 DIAGNOSIS — R809 Proteinuria, unspecified: Secondary | ICD-10-CM | POA: Diagnosis not present

## 2017-02-18 DIAGNOSIS — R809 Proteinuria, unspecified: Secondary | ICD-10-CM | POA: Diagnosis not present

## 2017-03-11 DIAGNOSIS — N183 Chronic kidney disease, stage 3 (moderate): Secondary | ICD-10-CM | POA: Diagnosis not present

## 2017-03-11 DIAGNOSIS — I129 Hypertensive chronic kidney disease with stage 1 through stage 4 chronic kidney disease, or unspecified chronic kidney disease: Secondary | ICD-10-CM | POA: Diagnosis not present

## 2017-04-02 DIAGNOSIS — N183 Chronic kidney disease, stage 3 (moderate): Secondary | ICD-10-CM | POA: Diagnosis not present

## 2017-04-02 DIAGNOSIS — E785 Hyperlipidemia, unspecified: Secondary | ICD-10-CM | POA: Diagnosis not present

## 2017-04-02 DIAGNOSIS — E119 Type 2 diabetes mellitus without complications: Secondary | ICD-10-CM | POA: Diagnosis not present

## 2017-04-09 DIAGNOSIS — I10 Essential (primary) hypertension: Secondary | ICD-10-CM | POA: Diagnosis not present

## 2017-04-09 DIAGNOSIS — E538 Deficiency of other specified B group vitamins: Secondary | ICD-10-CM | POA: Diagnosis not present

## 2017-04-09 DIAGNOSIS — N183 Chronic kidney disease, stage 3 (moderate): Secondary | ICD-10-CM | POA: Diagnosis not present

## 2017-04-09 DIAGNOSIS — E1122 Type 2 diabetes mellitus with diabetic chronic kidney disease: Secondary | ICD-10-CM | POA: Diagnosis not present

## 2017-04-09 DIAGNOSIS — D649 Anemia, unspecified: Secondary | ICD-10-CM | POA: Diagnosis not present

## 2017-04-09 DIAGNOSIS — E559 Vitamin D deficiency, unspecified: Secondary | ICD-10-CM | POA: Diagnosis not present

## 2017-04-09 DIAGNOSIS — E785 Hyperlipidemia, unspecified: Secondary | ICD-10-CM | POA: Diagnosis not present

## 2017-05-21 DIAGNOSIS — N179 Acute kidney failure, unspecified: Secondary | ICD-10-CM | POA: Diagnosis not present

## 2017-05-21 DIAGNOSIS — E559 Vitamin D deficiency, unspecified: Secondary | ICD-10-CM | POA: Diagnosis not present

## 2017-05-21 DIAGNOSIS — I129 Hypertensive chronic kidney disease with stage 1 through stage 4 chronic kidney disease, or unspecified chronic kidney disease: Secondary | ICD-10-CM | POA: Diagnosis not present

## 2017-05-21 DIAGNOSIS — R809 Proteinuria, unspecified: Secondary | ICD-10-CM | POA: Diagnosis not present

## 2017-05-21 DIAGNOSIS — D631 Anemia in chronic kidney disease: Secondary | ICD-10-CM | POA: Diagnosis not present

## 2017-05-21 DIAGNOSIS — N183 Chronic kidney disease, stage 3 (moderate): Secondary | ICD-10-CM | POA: Diagnosis not present

## 2017-05-21 DIAGNOSIS — N189 Chronic kidney disease, unspecified: Secondary | ICD-10-CM | POA: Diagnosis not present

## 2017-05-26 DIAGNOSIS — F418 Other specified anxiety disorders: Secondary | ICD-10-CM | POA: Diagnosis not present

## 2017-05-26 DIAGNOSIS — R413 Other amnesia: Secondary | ICD-10-CM | POA: Diagnosis not present

## 2017-05-26 DIAGNOSIS — F330 Major depressive disorder, recurrent, mild: Secondary | ICD-10-CM | POA: Diagnosis not present

## 2017-06-16 DIAGNOSIS — H40023 Open angle with borderline findings, high risk, bilateral: Secondary | ICD-10-CM | POA: Diagnosis not present

## 2017-06-16 DIAGNOSIS — H11441 Conjunctival cysts, right eye: Secondary | ICD-10-CM | POA: Diagnosis not present

## 2017-09-02 DIAGNOSIS — E538 Deficiency of other specified B group vitamins: Secondary | ICD-10-CM | POA: Diagnosis not present

## 2017-09-02 DIAGNOSIS — E559 Vitamin D deficiency, unspecified: Secondary | ICD-10-CM | POA: Diagnosis not present

## 2017-09-02 DIAGNOSIS — I10 Essential (primary) hypertension: Secondary | ICD-10-CM | POA: Diagnosis not present

## 2017-09-02 DIAGNOSIS — E1122 Type 2 diabetes mellitus with diabetic chronic kidney disease: Secondary | ICD-10-CM | POA: Diagnosis not present

## 2017-09-02 DIAGNOSIS — D649 Anemia, unspecified: Secondary | ICD-10-CM | POA: Diagnosis not present

## 2017-09-02 DIAGNOSIS — Z79899 Other long term (current) drug therapy: Secondary | ICD-10-CM | POA: Diagnosis not present

## 2017-09-10 DIAGNOSIS — D649 Anemia, unspecified: Secondary | ICD-10-CM | POA: Diagnosis not present

## 2017-09-10 DIAGNOSIS — Z23 Encounter for immunization: Secondary | ICD-10-CM | POA: Diagnosis not present

## 2017-09-10 DIAGNOSIS — I10 Essential (primary) hypertension: Secondary | ICD-10-CM | POA: Diagnosis not present

## 2017-09-10 DIAGNOSIS — E1122 Type 2 diabetes mellitus with diabetic chronic kidney disease: Secondary | ICD-10-CM | POA: Diagnosis not present

## 2017-10-15 DIAGNOSIS — R809 Proteinuria, unspecified: Secondary | ICD-10-CM | POA: Diagnosis not present

## 2017-10-15 DIAGNOSIS — I129 Hypertensive chronic kidney disease with stage 1 through stage 4 chronic kidney disease, or unspecified chronic kidney disease: Secondary | ICD-10-CM | POA: Diagnosis not present

## 2017-10-15 DIAGNOSIS — N183 Chronic kidney disease, stage 3 (moderate): Secondary | ICD-10-CM | POA: Diagnosis not present

## 2017-10-15 DIAGNOSIS — E559 Vitamin D deficiency, unspecified: Secondary | ICD-10-CM | POA: Diagnosis not present

## 2017-10-15 DIAGNOSIS — D631 Anemia in chronic kidney disease: Secondary | ICD-10-CM | POA: Diagnosis not present

## 2017-10-15 DIAGNOSIS — N189 Chronic kidney disease, unspecified: Secondary | ICD-10-CM | POA: Diagnosis not present

## 2017-10-15 DIAGNOSIS — N179 Acute kidney failure, unspecified: Secondary | ICD-10-CM | POA: Diagnosis not present

## 2017-10-16 DIAGNOSIS — R809 Proteinuria, unspecified: Secondary | ICD-10-CM | POA: Diagnosis not present

## 2017-10-16 DIAGNOSIS — N183 Chronic kidney disease, stage 3 (moderate): Secondary | ICD-10-CM | POA: Diagnosis not present

## 2017-10-16 DIAGNOSIS — N189 Chronic kidney disease, unspecified: Secondary | ICD-10-CM | POA: Diagnosis not present

## 2017-10-16 DIAGNOSIS — N179 Acute kidney failure, unspecified: Secondary | ICD-10-CM | POA: Diagnosis not present

## 2017-10-22 DIAGNOSIS — F330 Major depressive disorder, recurrent, mild: Secondary | ICD-10-CM | POA: Diagnosis not present

## 2017-10-30 DIAGNOSIS — R251 Tremor, unspecified: Secondary | ICD-10-CM | POA: Diagnosis not present

## 2017-10-30 DIAGNOSIS — Z7982 Long term (current) use of aspirin: Secondary | ICD-10-CM | POA: Diagnosis not present

## 2017-10-30 DIAGNOSIS — F329 Major depressive disorder, single episode, unspecified: Secondary | ICD-10-CM | POA: Diagnosis not present

## 2017-10-30 DIAGNOSIS — R413 Other amnesia: Secondary | ICD-10-CM | POA: Diagnosis not present

## 2017-10-30 DIAGNOSIS — G3189 Other specified degenerative diseases of nervous system: Secondary | ICD-10-CM | POA: Diagnosis not present

## 2017-10-30 DIAGNOSIS — F419 Anxiety disorder, unspecified: Secondary | ICD-10-CM | POA: Diagnosis not present

## 2017-10-30 DIAGNOSIS — Z79899 Other long term (current) drug therapy: Secondary | ICD-10-CM | POA: Diagnosis not present

## 2017-11-07 DIAGNOSIS — R251 Tremor, unspecified: Secondary | ICD-10-CM | POA: Diagnosis not present

## 2017-11-07 DIAGNOSIS — R413 Other amnesia: Secondary | ICD-10-CM | POA: Diagnosis not present

## 2017-11-10 DIAGNOSIS — N183 Chronic kidney disease, stage 3 (moderate): Secondary | ICD-10-CM | POA: Diagnosis not present

## 2017-11-10 DIAGNOSIS — I129 Hypertensive chronic kidney disease with stage 1 through stage 4 chronic kidney disease, or unspecified chronic kidney disease: Secondary | ICD-10-CM | POA: Diagnosis not present

## 2017-12-25 DIAGNOSIS — Z0000 Encounter for general adult medical examination without abnormal findings: Secondary | ICD-10-CM | POA: Diagnosis not present

## 2017-12-25 DIAGNOSIS — I10 Essential (primary) hypertension: Secondary | ICD-10-CM | POA: Diagnosis not present

## 2017-12-25 DIAGNOSIS — E1122 Type 2 diabetes mellitus with diabetic chronic kidney disease: Secondary | ICD-10-CM | POA: Diagnosis not present

## 2017-12-25 DIAGNOSIS — E538 Deficiency of other specified B group vitamins: Secondary | ICD-10-CM | POA: Diagnosis not present

## 2017-12-25 DIAGNOSIS — E785 Hyperlipidemia, unspecified: Secondary | ICD-10-CM | POA: Diagnosis not present

## 2017-12-28 DIAGNOSIS — D649 Anemia, unspecified: Secondary | ICD-10-CM | POA: Diagnosis not present

## 2018-01-04 DIAGNOSIS — Z0000 Encounter for general adult medical examination without abnormal findings: Secondary | ICD-10-CM | POA: Diagnosis not present

## 2018-01-04 DIAGNOSIS — I959 Hypotension, unspecified: Secondary | ICD-10-CM | POA: Diagnosis not present

## 2018-01-04 DIAGNOSIS — N183 Chronic kidney disease, stage 3 (moderate): Secondary | ICD-10-CM | POA: Diagnosis not present

## 2018-01-04 DIAGNOSIS — Z125 Encounter for screening for malignant neoplasm of prostate: Secondary | ICD-10-CM | POA: Diagnosis not present

## 2018-01-04 DIAGNOSIS — D509 Iron deficiency anemia, unspecified: Secondary | ICD-10-CM | POA: Diagnosis not present

## 2018-01-04 DIAGNOSIS — I5032 Chronic diastolic (congestive) heart failure: Secondary | ICD-10-CM | POA: Diagnosis not present

## 2018-01-04 DIAGNOSIS — E119 Type 2 diabetes mellitus without complications: Secondary | ICD-10-CM | POA: Diagnosis not present

## 2018-01-04 DIAGNOSIS — F418 Other specified anxiety disorders: Secondary | ICD-10-CM | POA: Diagnosis not present

## 2018-01-04 DIAGNOSIS — I129 Hypertensive chronic kidney disease with stage 1 through stage 4 chronic kidney disease, or unspecified chronic kidney disease: Secondary | ICD-10-CM | POA: Diagnosis not present

## 2018-01-07 DIAGNOSIS — H40023 Open angle with borderline findings, high risk, bilateral: Secondary | ICD-10-CM | POA: Diagnosis not present

## 2018-01-07 DIAGNOSIS — H35371 Puckering of macula, right eye: Secondary | ICD-10-CM | POA: Diagnosis not present

## 2018-01-07 DIAGNOSIS — H35341 Macular cyst, hole, or pseudohole, right eye: Secondary | ICD-10-CM | POA: Diagnosis not present

## 2018-01-07 DIAGNOSIS — E119 Type 2 diabetes mellitus without complications: Secondary | ICD-10-CM | POA: Diagnosis not present

## 2018-01-20 DIAGNOSIS — R413 Other amnesia: Secondary | ICD-10-CM | POA: Diagnosis not present

## 2018-01-20 DIAGNOSIS — N183 Chronic kidney disease, stage 3 (moderate): Secondary | ICD-10-CM | POA: Diagnosis not present

## 2018-01-20 DIAGNOSIS — Z79899 Other long term (current) drug therapy: Secondary | ICD-10-CM | POA: Diagnosis not present

## 2018-01-20 DIAGNOSIS — I959 Hypotension, unspecified: Secondary | ICD-10-CM | POA: Diagnosis not present

## 2018-01-25 ENCOUNTER — Encounter (INDEPENDENT_AMBULATORY_CARE_PROVIDER_SITE_OTHER): Payer: Medicare Other | Admitting: Ophthalmology

## 2018-01-25 DIAGNOSIS — H43813 Vitreous degeneration, bilateral: Secondary | ICD-10-CM

## 2018-01-25 DIAGNOSIS — E113392 Type 2 diabetes mellitus with moderate nonproliferative diabetic retinopathy without macular edema, left eye: Secondary | ICD-10-CM | POA: Diagnosis not present

## 2018-01-25 DIAGNOSIS — I10 Essential (primary) hypertension: Secondary | ICD-10-CM

## 2018-01-25 DIAGNOSIS — H59031 Cystoid macular edema following cataract surgery, right eye: Secondary | ICD-10-CM

## 2018-01-25 DIAGNOSIS — E11311 Type 2 diabetes mellitus with unspecified diabetic retinopathy with macular edema: Secondary | ICD-10-CM

## 2018-01-25 DIAGNOSIS — E113311 Type 2 diabetes mellitus with moderate nonproliferative diabetic retinopathy with macular edema, right eye: Secondary | ICD-10-CM | POA: Diagnosis not present

## 2018-01-25 DIAGNOSIS — H35033 Hypertensive retinopathy, bilateral: Secondary | ICD-10-CM

## 2018-02-01 DIAGNOSIS — Z5181 Encounter for therapeutic drug level monitoring: Secondary | ICD-10-CM | POA: Diagnosis not present

## 2018-02-16 DIAGNOSIS — I129 Hypertensive chronic kidney disease with stage 1 through stage 4 chronic kidney disease, or unspecified chronic kidney disease: Secondary | ICD-10-CM | POA: Diagnosis not present

## 2018-02-16 DIAGNOSIS — N183 Chronic kidney disease, stage 3 (moderate): Secondary | ICD-10-CM | POA: Diagnosis not present

## 2018-02-16 DIAGNOSIS — N179 Acute kidney failure, unspecified: Secondary | ICD-10-CM | POA: Diagnosis not present

## 2018-02-16 DIAGNOSIS — D631 Anemia in chronic kidney disease: Secondary | ICD-10-CM | POA: Diagnosis not present

## 2018-02-16 DIAGNOSIS — R809 Proteinuria, unspecified: Secondary | ICD-10-CM | POA: Diagnosis not present

## 2018-02-16 DIAGNOSIS — E559 Vitamin D deficiency, unspecified: Secondary | ICD-10-CM | POA: Diagnosis not present

## 2018-02-16 DIAGNOSIS — N189 Chronic kidney disease, unspecified: Secondary | ICD-10-CM | POA: Diagnosis not present

## 2018-03-08 ENCOUNTER — Encounter (INDEPENDENT_AMBULATORY_CARE_PROVIDER_SITE_OTHER): Payer: Medicare Other | Admitting: Ophthalmology

## 2018-03-10 DIAGNOSIS — G319 Degenerative disease of nervous system, unspecified: Secondary | ICD-10-CM | POA: Diagnosis not present

## 2018-03-10 DIAGNOSIS — G479 Sleep disorder, unspecified: Secondary | ICD-10-CM | POA: Diagnosis not present

## 2018-03-10 DIAGNOSIS — R413 Other amnesia: Secondary | ICD-10-CM | POA: Diagnosis not present

## 2018-03-11 ENCOUNTER — Encounter (INDEPENDENT_AMBULATORY_CARE_PROVIDER_SITE_OTHER): Payer: Medicare Other | Admitting: Ophthalmology

## 2018-03-11 DIAGNOSIS — H35033 Hypertensive retinopathy, bilateral: Secondary | ICD-10-CM | POA: Diagnosis not present

## 2018-03-11 DIAGNOSIS — E11311 Type 2 diabetes mellitus with unspecified diabetic retinopathy with macular edema: Secondary | ICD-10-CM

## 2018-03-11 DIAGNOSIS — I10 Essential (primary) hypertension: Secondary | ICD-10-CM

## 2018-03-11 DIAGNOSIS — E113392 Type 2 diabetes mellitus with moderate nonproliferative diabetic retinopathy without macular edema, left eye: Secondary | ICD-10-CM | POA: Diagnosis not present

## 2018-03-11 DIAGNOSIS — R419 Unspecified symptoms and signs involving cognitive functions and awareness: Secondary | ICD-10-CM | POA: Insufficient documentation

## 2018-03-11 DIAGNOSIS — E113311 Type 2 diabetes mellitus with moderate nonproliferative diabetic retinopathy with macular edema, right eye: Secondary | ICD-10-CM | POA: Diagnosis not present

## 2018-03-11 DIAGNOSIS — H43813 Vitreous degeneration, bilateral: Secondary | ICD-10-CM

## 2018-04-08 ENCOUNTER — Encounter (INDEPENDENT_AMBULATORY_CARE_PROVIDER_SITE_OTHER): Payer: Medicare Other | Admitting: Ophthalmology

## 2018-04-08 ENCOUNTER — Other Ambulatory Visit: Payer: Self-pay

## 2018-04-08 DIAGNOSIS — I10 Essential (primary) hypertension: Secondary | ICD-10-CM | POA: Diagnosis not present

## 2018-04-08 DIAGNOSIS — E11311 Type 2 diabetes mellitus with unspecified diabetic retinopathy with macular edema: Secondary | ICD-10-CM

## 2018-04-08 DIAGNOSIS — H43813 Vitreous degeneration, bilateral: Secondary | ICD-10-CM

## 2018-04-08 DIAGNOSIS — H35033 Hypertensive retinopathy, bilateral: Secondary | ICD-10-CM | POA: Diagnosis not present

## 2018-04-08 DIAGNOSIS — H35372 Puckering of macula, left eye: Secondary | ICD-10-CM

## 2018-04-08 DIAGNOSIS — E113311 Type 2 diabetes mellitus with moderate nonproliferative diabetic retinopathy with macular edema, right eye: Secondary | ICD-10-CM | POA: Diagnosis not present

## 2018-04-08 DIAGNOSIS — E113392 Type 2 diabetes mellitus with moderate nonproliferative diabetic retinopathy without macular edema, left eye: Secondary | ICD-10-CM | POA: Diagnosis not present

## 2018-05-06 ENCOUNTER — Encounter (INDEPENDENT_AMBULATORY_CARE_PROVIDER_SITE_OTHER): Payer: Medicare Other | Admitting: Ophthalmology

## 2018-05-06 ENCOUNTER — Other Ambulatory Visit: Payer: Self-pay

## 2018-05-06 DIAGNOSIS — I10 Essential (primary) hypertension: Secondary | ICD-10-CM

## 2018-05-06 DIAGNOSIS — E113211 Type 2 diabetes mellitus with mild nonproliferative diabetic retinopathy with macular edema, right eye: Secondary | ICD-10-CM | POA: Diagnosis not present

## 2018-05-06 DIAGNOSIS — H35033 Hypertensive retinopathy, bilateral: Secondary | ICD-10-CM

## 2018-05-06 DIAGNOSIS — E11311 Type 2 diabetes mellitus with unspecified diabetic retinopathy with macular edema: Secondary | ICD-10-CM

## 2018-05-06 DIAGNOSIS — H43813 Vitreous degeneration, bilateral: Secondary | ICD-10-CM

## 2018-05-06 DIAGNOSIS — E113392 Type 2 diabetes mellitus with moderate nonproliferative diabetic retinopathy without macular edema, left eye: Secondary | ICD-10-CM

## 2018-06-03 ENCOUNTER — Encounter (INDEPENDENT_AMBULATORY_CARE_PROVIDER_SITE_OTHER): Payer: Medicare Other | Admitting: Ophthalmology

## 2018-06-03 DIAGNOSIS — H35033 Hypertensive retinopathy, bilateral: Secondary | ICD-10-CM | POA: Diagnosis not present

## 2018-06-03 DIAGNOSIS — E113311 Type 2 diabetes mellitus with moderate nonproliferative diabetic retinopathy with macular edema, right eye: Secondary | ICD-10-CM | POA: Diagnosis not present

## 2018-06-03 DIAGNOSIS — H43813 Vitreous degeneration, bilateral: Secondary | ICD-10-CM

## 2018-06-03 DIAGNOSIS — E113392 Type 2 diabetes mellitus with moderate nonproliferative diabetic retinopathy without macular edema, left eye: Secondary | ICD-10-CM

## 2018-06-03 DIAGNOSIS — E11311 Type 2 diabetes mellitus with unspecified diabetic retinopathy with macular edema: Secondary | ICD-10-CM

## 2018-06-03 DIAGNOSIS — I10 Essential (primary) hypertension: Secondary | ICD-10-CM

## 2018-06-29 DIAGNOSIS — G479 Sleep disorder, unspecified: Secondary | ICD-10-CM | POA: Diagnosis not present

## 2018-06-29 DIAGNOSIS — R413 Other amnesia: Secondary | ICD-10-CM | POA: Diagnosis not present

## 2018-06-29 DIAGNOSIS — G4720 Circadian rhythm sleep disorder, unspecified type: Secondary | ICD-10-CM | POA: Diagnosis not present

## 2018-07-01 DIAGNOSIS — H11441 Conjunctival cysts, right eye: Secondary | ICD-10-CM | POA: Diagnosis not present

## 2018-07-01 DIAGNOSIS — H40023 Open angle with borderline findings, high risk, bilateral: Secondary | ICD-10-CM | POA: Diagnosis not present

## 2018-07-15 ENCOUNTER — Encounter (INDEPENDENT_AMBULATORY_CARE_PROVIDER_SITE_OTHER): Payer: Medicare Other | Admitting: Ophthalmology

## 2018-07-15 ENCOUNTER — Other Ambulatory Visit: Payer: Self-pay

## 2018-07-15 DIAGNOSIS — E11311 Type 2 diabetes mellitus with unspecified diabetic retinopathy with macular edema: Secondary | ICD-10-CM

## 2018-07-15 DIAGNOSIS — I10 Essential (primary) hypertension: Secondary | ICD-10-CM | POA: Diagnosis not present

## 2018-07-15 DIAGNOSIS — H43813 Vitreous degeneration, bilateral: Secondary | ICD-10-CM

## 2018-07-15 DIAGNOSIS — E113292 Type 2 diabetes mellitus with mild nonproliferative diabetic retinopathy without macular edema, left eye: Secondary | ICD-10-CM

## 2018-07-15 DIAGNOSIS — H35033 Hypertensive retinopathy, bilateral: Secondary | ICD-10-CM

## 2018-07-15 DIAGNOSIS — E113211 Type 2 diabetes mellitus with mild nonproliferative diabetic retinopathy with macular edema, right eye: Secondary | ICD-10-CM | POA: Diagnosis not present

## 2018-07-21 DIAGNOSIS — Z7189 Other specified counseling: Secondary | ICD-10-CM | POA: Diagnosis not present

## 2018-07-21 DIAGNOSIS — D509 Iron deficiency anemia, unspecified: Secondary | ICD-10-CM | POA: Diagnosis not present

## 2018-07-21 DIAGNOSIS — I10 Essential (primary) hypertension: Secondary | ICD-10-CM | POA: Diagnosis not present

## 2018-07-21 DIAGNOSIS — I5032 Chronic diastolic (congestive) heart failure: Secondary | ICD-10-CM | POA: Diagnosis not present

## 2018-07-21 DIAGNOSIS — E119 Type 2 diabetes mellitus without complications: Secondary | ICD-10-CM | POA: Diagnosis not present

## 2018-08-26 ENCOUNTER — Other Ambulatory Visit: Payer: Self-pay

## 2018-08-26 ENCOUNTER — Encounter (INDEPENDENT_AMBULATORY_CARE_PROVIDER_SITE_OTHER): Payer: Medicare Other | Admitting: Ophthalmology

## 2018-08-26 DIAGNOSIS — H35033 Hypertensive retinopathy, bilateral: Secondary | ICD-10-CM | POA: Diagnosis not present

## 2018-08-26 DIAGNOSIS — E11311 Type 2 diabetes mellitus with unspecified diabetic retinopathy with macular edema: Secondary | ICD-10-CM

## 2018-08-26 DIAGNOSIS — H43813 Vitreous degeneration, bilateral: Secondary | ICD-10-CM

## 2018-08-26 DIAGNOSIS — I10 Essential (primary) hypertension: Secondary | ICD-10-CM | POA: Diagnosis not present

## 2018-08-26 DIAGNOSIS — E113292 Type 2 diabetes mellitus with mild nonproliferative diabetic retinopathy without macular edema, left eye: Secondary | ICD-10-CM | POA: Diagnosis not present

## 2018-08-26 DIAGNOSIS — E113211 Type 2 diabetes mellitus with mild nonproliferative diabetic retinopathy with macular edema, right eye: Secondary | ICD-10-CM

## 2018-09-03 DIAGNOSIS — N183 Chronic kidney disease, stage 3 (moderate): Secondary | ICD-10-CM | POA: Diagnosis not present

## 2018-09-03 DIAGNOSIS — N189 Chronic kidney disease, unspecified: Secondary | ICD-10-CM | POA: Diagnosis not present

## 2018-09-03 DIAGNOSIS — D631 Anemia in chronic kidney disease: Secondary | ICD-10-CM | POA: Diagnosis not present

## 2018-09-03 DIAGNOSIS — I129 Hypertensive chronic kidney disease with stage 1 through stage 4 chronic kidney disease, or unspecified chronic kidney disease: Secondary | ICD-10-CM | POA: Diagnosis not present

## 2018-09-03 DIAGNOSIS — E559 Vitamin D deficiency, unspecified: Secondary | ICD-10-CM | POA: Diagnosis not present

## 2018-09-03 DIAGNOSIS — N179 Acute kidney failure, unspecified: Secondary | ICD-10-CM | POA: Diagnosis not present

## 2018-09-03 DIAGNOSIS — R809 Proteinuria, unspecified: Secondary | ICD-10-CM | POA: Diagnosis not present

## 2018-09-06 DIAGNOSIS — N179 Acute kidney failure, unspecified: Secondary | ICD-10-CM | POA: Diagnosis not present

## 2018-09-06 DIAGNOSIS — R809 Proteinuria, unspecified: Secondary | ICD-10-CM | POA: Diagnosis not present

## 2018-09-30 ENCOUNTER — Encounter (INDEPENDENT_AMBULATORY_CARE_PROVIDER_SITE_OTHER): Payer: Medicare Other | Admitting: Ophthalmology

## 2018-09-30 DIAGNOSIS — E113292 Type 2 diabetes mellitus with mild nonproliferative diabetic retinopathy without macular edema, left eye: Secondary | ICD-10-CM | POA: Diagnosis not present

## 2018-09-30 DIAGNOSIS — H35033 Hypertensive retinopathy, bilateral: Secondary | ICD-10-CM | POA: Diagnosis not present

## 2018-09-30 DIAGNOSIS — H35371 Puckering of macula, right eye: Secondary | ICD-10-CM

## 2018-09-30 DIAGNOSIS — I10 Essential (primary) hypertension: Secondary | ICD-10-CM | POA: Diagnosis not present

## 2018-09-30 DIAGNOSIS — H43813 Vitreous degeneration, bilateral: Secondary | ICD-10-CM

## 2018-09-30 DIAGNOSIS — E11311 Type 2 diabetes mellitus with unspecified diabetic retinopathy with macular edema: Secondary | ICD-10-CM

## 2018-09-30 DIAGNOSIS — E113211 Type 2 diabetes mellitus with mild nonproliferative diabetic retinopathy with macular edema, right eye: Secondary | ICD-10-CM | POA: Diagnosis not present

## 2018-10-05 DIAGNOSIS — F332 Major depressive disorder, recurrent severe without psychotic features: Secondary | ICD-10-CM | POA: Insufficient documentation

## 2018-10-05 DIAGNOSIS — G4720 Circadian rhythm sleep disorder, unspecified type: Secondary | ICD-10-CM | POA: Diagnosis not present

## 2018-10-11 ENCOUNTER — Other Ambulatory Visit: Payer: Self-pay

## 2018-10-11 DIAGNOSIS — Z20822 Contact with and (suspected) exposure to covid-19: Secondary | ICD-10-CM

## 2018-10-14 LAB — NOVEL CORONAVIRUS, NAA: SARS-CoV-2, NAA: NOT DETECTED

## 2018-10-15 DIAGNOSIS — Z23 Encounter for immunization: Secondary | ICD-10-CM | POA: Diagnosis not present

## 2018-11-11 ENCOUNTER — Encounter (INDEPENDENT_AMBULATORY_CARE_PROVIDER_SITE_OTHER): Payer: Medicare Other | Admitting: Ophthalmology

## 2018-11-11 DIAGNOSIS — E113292 Type 2 diabetes mellitus with mild nonproliferative diabetic retinopathy without macular edema, left eye: Secondary | ICD-10-CM | POA: Diagnosis not present

## 2018-11-11 DIAGNOSIS — I10 Essential (primary) hypertension: Secondary | ICD-10-CM

## 2018-11-11 DIAGNOSIS — H35033 Hypertensive retinopathy, bilateral: Secondary | ICD-10-CM

## 2018-11-11 DIAGNOSIS — E113211 Type 2 diabetes mellitus with mild nonproliferative diabetic retinopathy with macular edema, right eye: Secondary | ICD-10-CM

## 2018-11-11 DIAGNOSIS — H43813 Vitreous degeneration, bilateral: Secondary | ICD-10-CM

## 2018-11-11 DIAGNOSIS — E11311 Type 2 diabetes mellitus with unspecified diabetic retinopathy with macular edema: Secondary | ICD-10-CM

## 2018-12-06 ENCOUNTER — Encounter (INDEPENDENT_AMBULATORY_CARE_PROVIDER_SITE_OTHER): Payer: Medicare Other | Admitting: Ophthalmology

## 2018-12-09 DIAGNOSIS — N179 Acute kidney failure, unspecified: Secondary | ICD-10-CM | POA: Diagnosis not present

## 2018-12-09 DIAGNOSIS — I129 Hypertensive chronic kidney disease with stage 1 through stage 4 chronic kidney disease, or unspecified chronic kidney disease: Secondary | ICD-10-CM | POA: Diagnosis not present

## 2018-12-09 DIAGNOSIS — N189 Chronic kidney disease, unspecified: Secondary | ICD-10-CM | POA: Diagnosis not present

## 2018-12-09 DIAGNOSIS — N183 Chronic kidney disease, stage 3 unspecified: Secondary | ICD-10-CM | POA: Diagnosis not present

## 2018-12-09 DIAGNOSIS — D631 Anemia in chronic kidney disease: Secondary | ICD-10-CM | POA: Diagnosis not present

## 2018-12-09 DIAGNOSIS — R809 Proteinuria, unspecified: Secondary | ICD-10-CM | POA: Diagnosis not present

## 2018-12-15 ENCOUNTER — Other Ambulatory Visit: Payer: Self-pay

## 2018-12-15 ENCOUNTER — Encounter: Payer: Self-pay | Admitting: Podiatry

## 2018-12-15 ENCOUNTER — Ambulatory Visit: Payer: Medicare Other | Admitting: Podiatry

## 2018-12-15 VITALS — BP 107/54 | HR 73

## 2018-12-15 DIAGNOSIS — M2170 Unequal limb length (acquired), unspecified site: Secondary | ICD-10-CM

## 2018-12-15 DIAGNOSIS — M79674 Pain in right toe(s): Secondary | ICD-10-CM | POA: Diagnosis not present

## 2018-12-15 DIAGNOSIS — B351 Tinea unguium: Secondary | ICD-10-CM | POA: Diagnosis not present

## 2018-12-15 DIAGNOSIS — M79675 Pain in left toe(s): Secondary | ICD-10-CM | POA: Diagnosis not present

## 2018-12-15 DIAGNOSIS — L84 Corns and callosities: Secondary | ICD-10-CM

## 2018-12-15 DIAGNOSIS — E119 Type 2 diabetes mellitus without complications: Secondary | ICD-10-CM | POA: Diagnosis not present

## 2018-12-15 NOTE — Patient Instructions (Addendum)
Diabetes Mellitus and Foot Care Foot care is an important part of your health, especially when you have diabetes. Diabetes may cause you to have problems because of poor blood flow (circulation) to your feet and legs, which can cause your skin to:  Become thinner and drier.  Break more easily.  Heal more slowly.  Peel and crack. You may also have nerve damage (neuropathy) in your legs and feet, causing decreased feeling in them. This means that you may not notice minor injuries to your feet that could lead to more serious problems. Noticing and addressing any potential problems early is the best way to prevent future foot problems. How to care for your feet Foot hygiene  Wash your feet daily with warm water and mild soap. Do not use hot water. Then, pat your feet and the areas between your toes until they are completely dry. Do not soak your feet as this can dry your skin.  Trim your toenails straight across. Do not dig under them or around the cuticle. File the edges of your nails with an emery board or nail file.  Apply a moisturizing lotion or petroleum jelly to the skin on your feet and to dry, brittle toenails. Use lotion that does not contain alcohol and is unscented. Do not apply lotion between your toes. Shoes and socks  Wear clean socks or stockings every day. Make sure they are not too tight. Do not wear knee-high stockings since they may decrease blood flow to your legs.  Wear shoes that fit properly and have enough cushioning. Always look in your shoes before you put them on to be sure there are no objects inside.  To break in new shoes, wear them for just a few hours a day. This prevents injuries on your feet. Wounds, scrapes, corns, and calluses  Check your feet daily for blisters, cuts, bruises, sores, and redness. If you cannot see the bottom of your feet, use a mirror or ask someone for help.  Do not cut corns or calluses or try to remove them with medicine.  If you  find a minor scrape, cut, or break in the skin on your feet, keep it and the skin around it clean and dry. You may clean these areas with mild soap and water. Do not clean the area with peroxide, alcohol, or iodine.  If you have a wound, scrape, corn, or callus on your foot, look at it several times a day to make sure it is healing and not infected. Check for: ? Redness, swelling, or pain. ? Fluid or blood. ? Warmth. ? Pus or a bad smell. General instructions  Do not cross your legs. This may decrease blood flow to your feet.  Do not use heating pads or hot water bottles on your feet. They may burn your skin. If you have lost feeling in your feet or legs, you may not know this is happening until it is too late.  Protect your feet from hot and cold by wearing shoes, such as at the beach or on hot pavement.  Schedule a complete foot exam at least once a year (annually) or more often if you have foot problems. If you have foot problems, report any cuts, sores, or bruises to your health care provider immediately. Contact a health care provider if:  You have a medical condition that increases your risk of infection and you have any cuts, sores, or bruises on your feet.  You have an injury that is not   healing.  You have redness on your legs or feet.  You feel burning or tingling in your legs or feet.  You have pain or cramps in your legs and feet.  Your legs or feet are numb.  Your feet always feel cold.  You have pain around a toenail. Get help right away if:  You have a wound, scrape, corn, or callus on your foot and: ? You have pain, swelling, or redness that gets worse. ? You have fluid or blood coming from the wound, scrape, corn, or callus. ? Your wound, scrape, corn, or callus feels warm to the touch. ? You have pus or a bad smell coming from the wound, scrape, corn, or callus. ? You have a fever. ? You have a red line going up your leg. Summary  Check your feet every day  for cuts, sores, red spots, swelling, and blisters.  Moisturize feet and legs daily.  Wear shoes that fit properly and have enough cushioning.  If you have foot problems, report any cuts, sores, or bruises to your health care provider immediately.  Schedule a complete foot exam at least once a year (annually) or more often if you have foot problems. This information is not intended to replace advice given to you by your health care provider. Make sure you discuss any questions you have with your health care provider. Document Released: 12/21/1999 Document Revised: 02/04/2017 Document Reviewed: 01/25/2016 Elsevier Patient Education  2020 [employer]  Corns and Calluses Corns are small areas of thickened skin that occur on the top, sides, or tip of a toe. They contain a cone-shaped core with a point that can press on a nerve below. This causes pain.  Calluses are areas of thickened skin that can occur anywhere on the body, including the hands, fingers, palms, soles of the feet, and heels. Calluses are usually larger than corns. What are the causes? Corns and calluses are caused by rubbing (friction) or pressure, such as from shoes that are too tight or do not fit properly. What increases the risk? Corns are more likely to develop in people who have misshapen toes (toe deformities), such as hammer toes. Calluses can occur with friction to any area of the skin. They are more likely to develop in people who:  Work with their hands.  Wear shoes that fit poorly, are too tight, or are high-heeled.  Have toe deformities. What are the signs or symptoms? Symptoms of a corn or callus include:  A hard growth on the skin.  Pain or tenderness under the skin.  Redness and swelling.  Increased discomfort while wearing tight-fitting shoes, if your feet are affected. If a corn or callus becomes infected, symptoms may include:  Redness and swelling that gets worse.  Pain.  Fluid, blood, or  pus draining from the corn or callus. How is this diagnosed? Corns and calluses may be diagnosed based on your symptoms, your medical history, and a physical exam. How is this treated? Treatment for corns and calluses may include:  Removing the cause of the friction or pressure. This may involve: ? Changing your shoes. ? Wearing shoe inserts (orthotics) or other protective layers in your shoes, such as a corn pad. ? Wearing gloves.  Applying medicine to the skin (topical medicine) to help soften skin in the hardened, thickened areas.  Removing layers of dead skin with a file to reduce the size of the corn or callus.  Removing the corn or callus with a scalpel or   laser.  Taking antibiotic medicines, if your corn or callus is infected.  Having surgery, if a toe deformity is the cause. Follow these instructions at home:   Take over-the-counter and prescription medicines only as told by your health care provider.  If you were prescribed an antibiotic, take it as told by your health care provider. Do not stop taking it even if your condition starts to improve.  Wear shoes that fit well. Avoid wearing high-heeled shoes and shoes that are too tight or too loose.  Wear any padding, protective layers, gloves, or orthotics as told by your health care provider.  Soak your hands or feet and then use a file or pumice stone to soften your corn or callus. Do this as told by your health care provider.  Check your corn or callus every day for symptoms of infection. Contact a health care provider if you:  Notice that your symptoms do not improve with treatment.  Have redness or swelling that gets worse.  Notice that your corn or callus becomes painful.  Have fluid, blood, or pus coming from your corn or callus.  Have new symptoms. Summary  Corns are small areas of thickened skin that occur on the top, sides, or tip of a toe.  Calluses are areas of thickened skin that can occur anywhere  on the body, including the hands, fingers, palms, and soles of the feet. Calluses are usually larger than corns.  Corns and calluses are caused by rubbing (friction) or pressure, such as from shoes that are too tight or do not fit properly.  Treatment may include wearing any padding, protective layers, gloves, or orthotics as told by your health care provider. This information is not intended to replace advice given to you by your health care provider. Make sure you discuss any questions you have with your health care provider. Document Released: 09/29/2003 Document Revised: 04/14/2018 Document Reviewed: 11/05/2016 Elsevier Patient Education  2020 [employer]  Onychomycosis/Fungal Toenails  WHAT IS IT? An infection that lies within the keratin of your nail plate that is caused by a fungus.  WHY ME? Fungal infections affect all ages, sexes, races, and creeds.  There may be many factors that predispose you to a fungal infection such as age, coexisting medical conditions such as diabetes, or an autoimmune disease; stress, medications, fatigue, genetics, etc.  Bottom line: fungus thrives in a warm, moist environment and your shoes offer such a location.  IS IT CONTAGIOUS? Theoretically, yes.  You do not want to share shoes, nail clippers or files with someone who has fungal toenails.  Walking around barefoot in the same room or sleeping in the same bed is unlikely to transfer the organism.  It is important to realize, however, that fungus can spread easily from one nail to the next on the same foot.  HOW DO WE TREAT THIS?  There are several ways to treat this condition.  Treatment may depend on many factors such as age, medications, pregnancy, liver and kidney conditions, etc.  It is best to ask your doctor which options are available to you.  1. No treatment.   Unlike many other medical concerns, you can live with this condition.  However for many people this can be a painful condition and may  lead to ingrown toenails or a bacterial infection.  It is recommended that you keep the nails cut short to help reduce the amount of fungal nail. 2. Topical treatment.  These range from herbal remedies to prescription   strength nail lacquers.  About 40-50% effective, topicals require twice daily application for approximately 9 to 12 months or until an entirely new nail has grown out.  The most effective topicals are medical grade medications available through physicians offices. 3. Oral antifungal medications.  With an 80-90% cure rate, the most common oral medication requires 3 to 4 months of therapy and stays in your system for a year as the new nail grows out.  Oral antifungal medications do require blood work to make sure it is a safe drug for you.  A liver function panel will be performed prior to starting the medication and after the first month of treatment.  It is important to have the blood work performed to avoid any harmful side effects.  In general, this medication safe but blood work is required. 4. Laser Therapy.  This treatment is performed by applying a specialized laser to the affected nail plate.  This therapy is noninvasive, fast, and non-painful.  It is not covered by insurance and is therefore, out of pocket.  The results have been very good with a 80-95% cure rate.  [facility] is the only practice in the area to offer this therapy. 5. Permanent Nail Avulsion.  Removing the entire nail so that a new nail will not grow back. 

## 2018-12-16 ENCOUNTER — Encounter (INDEPENDENT_AMBULATORY_CARE_PROVIDER_SITE_OTHER): Payer: Medicare Other | Admitting: Ophthalmology

## 2018-12-16 DIAGNOSIS — H43813 Vitreous degeneration, bilateral: Secondary | ICD-10-CM

## 2018-12-16 DIAGNOSIS — E11311 Type 2 diabetes mellitus with unspecified diabetic retinopathy with macular edema: Secondary | ICD-10-CM

## 2018-12-16 DIAGNOSIS — I10 Essential (primary) hypertension: Secondary | ICD-10-CM

## 2018-12-16 DIAGNOSIS — E113292 Type 2 diabetes mellitus with mild nonproliferative diabetic retinopathy without macular edema, left eye: Secondary | ICD-10-CM

## 2018-12-16 DIAGNOSIS — H35033 Hypertensive retinopathy, bilateral: Secondary | ICD-10-CM

## 2018-12-16 DIAGNOSIS — H35371 Puckering of macula, right eye: Secondary | ICD-10-CM

## 2018-12-16 DIAGNOSIS — E113211 Type 2 diabetes mellitus with mild nonproliferative diabetic retinopathy with macular edema, right eye: Secondary | ICD-10-CM | POA: Diagnosis not present

## 2018-12-22 NOTE — Progress Notes (Signed)
Subjective: Gerald [name] presents today referred by [name], DO for diabetic foot evaluation.  Patient relates 45 year history of diabetes. He states he has been diet controlled for the past few years.  Patient denies any history of foot wounds.  Patient relates occansional numbness, tingling and burning in feet.  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 This requires he wear a shoe with external buildup on the left side.  Today, patient c/o of painful, discolored, thick toenails which interfere with daily activities.  Pain is aggravated when wearing enclosed shoe gear.   He is also requesting diabetic shoes on today's visit.   Past Medical History:  Diagnosis Date  . Abnormality of gait 10/05/2014  . Anxiety   . Bilateral edema of lower extremity    LE Dopplers 4/22 - no thrombus or thrombophelbitis, no reflux  . Colon polyp   . DDD (degenerative disc disease)   . Depression   . Diabetes mellitus without complication ([place])    controlled by diet  . DJD (degenerative joint disease)   . DOE (dyspnea on exertion)    Echo 04 /22/2014 - normal EF, mild LVH, grade 1 Diastolic Dysfunction; Lexiscan Myoview 04/28/12 - no ishcemia or infarction, LBBB septal wall motion; EF ~60%  . Fatigue   . GERD (gastroesophageal reflux disease)   . H/O iron deficiency anemia    with  hemmorrhoids  . Hyperlipidemia   . Hypertension   . Left bundle branch block     chronic, negative Myoview a normal echo as noted above   . Osteoarthritis    with bilateral hip surgeries and multiple complication, with the most recent  surgery in January 2012  . Scrotal edema   . Vitamin D deficiency     Patient Active Problem List   Diagnosis Date Noted  . Severe episode of recurrent major depressive disorder, without psychotic features ([place]) 10/05/2018  . Cognitive complaints with normal  neuropsychological exam 03/11/2018  . Referred otalgia of left ear 09/23/2016  . Temporomandibular joint (TMJ) pain 09/23/2016  . Cervicogenic headache 09/03/2016  . Obstructive sleep apnea syndrome 11/15/2015  . Memory difficulties 10/05/2014  . Abnormality of gait 10/05/2014  . Intermittent explosive disorder 09/25/2014  . 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 Encounter for long-term (current) use of other medications 04/28/2013  . Vitamin D Deficiency 04/28/2013  . Sleep disorder -Epworth OSA scale = 15 06/02/2012  . Anal fissure 08/09/2010  . Septic arthritis of hip ([place]) 04/11/2010  . HYPERLIPIDEMIA 01/11/2010  . DEPRESSION 01/11/2010  . HYPERTENSION 01/11/2010  . GERD 01/11/2010  . Depression 01/11/2010  . Hyperlipidemia 01/11/2010  . T2 NIDDM w/Stage 2 CKD 09/21/2008  . ANEMIA, IRON DEFICIENCY 09/21/2008  . Personal history of colonic polyps 09/21/2008  . Anemia, iron deficiency 09/21/2008    Past Surgical History:  Procedure Laterality Date  . Cardiology Nuclear Med Study   04 24 2014   overall impression ; NORMAL  STRESS  NUCLEAR STUDY  . HIP SURGERY  2004  . Venous Duplex  04 22 2014   LOWER EXTREMITY SWELLING -Impressions [name] ; This is a normal bilateral  lower extremity venous duplex Doppler evaluation    Current Outpatient Medications on File Prior to Visit  Medication Sig Dispense Refill  . ALPRAZolam (XANAX) 0.25 MG tablet     . amLODipine (NORVASC) 10  MG tablet     . atorvastatin (LIPITOR) 10 MG tablet     . donepezil (ARICEPT) 5 MG tablet Take by mouth.    Marland Kitchen aspirin 81 MG tablet Take 81 mg by mouth daily.    . bisoprolol-hydrochlorothiazide (ZIAC) 5-6.25 MG per tablet Take 0.5 tablets by mouth daily. 90 tablet 1  . buPROPion (WELLBUTRIN XL) 150 MG 24 hr tablet TAKE 1 TABLET (150 MG TOTAL) BY MOUTH EVERY MORNING. 90 tablet 0  . buPROPion (WELLBUTRIN XL) 300 MG 24 hr tablet     . Cholecalciferol (VITAMIN D3)  5000 UNITS CAPS Take 5,000 Units by mouth daily.     Marland Kitchen doxazosin (CARDURA) 8 MG tablet Take 1 tablet (8 mg total) by mouth daily. 90 tablet 3  . enalapril (VASOTEC) 20 MG tablet Take 20 mg by mouth 2 (two) times daily.    . Ferrous Sulfate (IRON) 325 (65 FE) MG TABS Take 65 mg by mouth daily.     . Fiber, Guar Gum, CHEW Chew 1 capsule by mouth daily.    Marland Kitchen FLUoxetine (PROZAC) 40 MG capsule Take 1 capsule (40 mg total) by mouth daily. 90 capsule 0  . furosemide (LASIX) 40 MG tablet Take 1 tablet (40 mg total) by mouth 2 (two) times daily. 180 tablet 3  . Magnesium 250 MG TABS Take 1 tablet by mouth daily.     . Melatonin 5 MG SUBL Place 5 mg under the tongue at bedtime as needed (sleep).     . meloxicam (MOBIC) 15 MG tablet Take 1 tablet (15 mg total) by mouth daily. 90 tablet 1  . Multiple Vitamin (MULTIVITAMIN WITH MINERALS) TABS tablet Take 1 tablet by mouth daily.    Marland Kitchen nystatin (MYCOSTATIN/NYSTOP) 100000 UNIT/GM POWD Apply 1 g topically 2 (two) times daily as needed (rash).    Marland Kitchen omeprazole (PRILOSEC) 40 MG capsule Take 1 capsule (40 mg total) by mouth daily. 90 capsule 1  . polyethylene glycol (MIRALAX / GLYCOLAX) packet Take 17 g by mouth daily.     . QUEtiapine (SEROQUEL) 200 MG tablet Take 1 tablet (200 mg total) by mouth at bedtime. 90 tablet 0  . ranitidine (ZANTAC) 75 MG tablet Take by mouth.    . Sennosides (EX-LAX PO) Take 1 tablet by mouth daily as needed (constipation).    . vitamin B-12 (CYANOCOBALAMIN) 1000 MCG tablet Take 1,000 mcg by mouth daily.    Marland Kitchen ZINC OXIDE, TOPICAL, (DIAPER RASH) 10 % CREA Apply 1 application topically as needed (for rash).     No current facility-administered medications on file prior to visit.     No Known Allergies  Social History   Occupational History  . Occupation: Retired    Fish farm manager: [employer]  Tobacco Use  . Smoking status: Former Smoker    Packs/day: 0.50    Years: 30.00    Pack years: 15.00    Quit date: 03/30/1983     Years since quitting: 35.7  . Smokeless tobacco: Never Used  Substance and Sexual Activity  . Alcohol use: Yes    Alcohol/week: 1.0 standard drinks    Types: 1 Standard drinks or equivalent per week    Comment: couple times a year  . Drug use: No  . Sexual activity: Not Currently    Family History  Problem Relation Age of Onset  . Dementia Mother   . Hypertension Mother   . Heart disease Mother   . Diabetes Mother   . Heart attack Maternal Grandmother   .  Diabetes Maternal Grandfather     Immunization History  Administered Date(s) Administered  . DTaP 01/07/2003  . Influenza Split 10/14/2010, 09/21/2012  . Pneumococcal Polysaccharide-23 06/16/2012  . Zoster 06/12/2011    Review of systems: Positive Findings in bold print.  Constitutional:  chills, fatigue, fever, sweats, weight change Communication: Optometrist, sign Ecologist, hand writing, iPad/Android device Head: headaches, head injury Eyes: changes in vision, eye pain, glaucoma, cataracts, macular degeneration, diplopia, glare,  light sensitivity, eyeglasses or contacts, blindness Ears nose mouth throat: hearing impaired, hearing aids,  ringing in ears, deaf, sign language,  vertigo, nosebleeds,  rhinitis,  cold sores, snoring, swollen glands Cardiovascular: HTN, edema, arrhythmia, pacemaker in place, defibrillator in place, chest pain/tightness, chronic anticoagulation, blood clot, heart failure, MI Peripheral Vascular: leg cramps, varicose veins, blood clots, lymphedema, varicosities Respiratory:  difficulty breathing, denies congestion, SOB, wheezing, cough, emphysema Gastrointestinal: change in appetite or weight, abdominal pain, constipation, diarrhea, nausea, vomiting, vomiting blood, change in bowel habits, abdominal pain, jaundice, rectal bleeding, hemorrhoids, GERD Genitourinary:  nocturia,  pain on urination, polyuria,  blood in urine, Foley catheter, urinary urgency, ESRD on  hemodialysis Musculoskeletal: amputation, cramping, stiff joints, painful joints, decreased joint motion, fractures, OA, gout, hemiplegia, paraplegia, uses cane, wheelchair bound, uses walker, uses rollator Skin: +changes in toenails, color change, dryness, itching, mole changes,  rash, wound(s) Neurological: headaches, numbness in feet, paresthesias in feet, burning in feet, fainting,  seizures, change in speech,  headaches, memory problems/poor historian, cerebral palsy, weakness, paralysis, CVA, TIA Endocrine: diabetes, hypothyroidism, hyperthyroidism,  goiter, dry mouth, flushing, heat intolerance,  cold intolerance,  excessive thirst, denies polyuria,  nocturia Hematological:  easy bleeding, excessive bleeding, easy bruising, enlarged lymph nodes, on long term blood thinner, history of past transusions Allergy/immunological:  hives, eczema, frequent infections, multiple drug allergies, seasonal allergies, transplant recipient, multiple food allergies Psychiatric:  anxiety, depression, mood disorder, suicidal ideations, hallucinations, insomnia  Objective: Vitals:   12/15/18 0837  BP: (!) 107/54  Pulse: 73   Vascular Examination: Capillary refill time immediate x 10 digits  Dorsalis pedis pulses faintly palpable b/l.  Posterior tibial pulses diminished b/l.  Digital hair sparse b/l.    Skin temperature gradient WNL b/l.  Dermatological Examination: Skin with normal turgor, texture and tone b/l.  Toenails 1-5 b/l discolored, thick, dystrophic with subungual debris and pain with palpation to nailbeds due to thickness of nails.  Hyperkeratotic lesion submet head 5 right foot with tenderness to palpation. No edema, no erythema, no drainage, no flocculence.  Musculoskeletal: Muscle strength 5/5 to all LE muscle groups b/l.   Hammertoes b/l  Limb length discrepancy left lower extremity which requires external shoe build up.   Neurological: Sensation intact 5/5 b/l with 10 gram  monofilament.  Vibratory sensation intact b/l.   Assessment: 1. Painful onychomycosis toenails 1-5 b/l  2. Callus submet head 5 right foot 3. Limb length discrepancy 4. NIDDM  Plan: 1. Discussed diabetic foot care principles. Literature dispensed on today. 2. Toenails 1-5 b/l were debrided in length and girth without iatrogenic bleeding. 3. Callus pared submetatarsal head 5 right foot utilizing sterile scalpel blade without incident. 4. Patient to continue soft, supportive shoe gear daily. 5. Rx written for diabetic shoes: one pair of extra depth shoes with custom insoles. Offload submet head 5 callus right foot. Add buildup to left foot for limb length discrepancy. Per Medicare guidelines, patient's feet need to be evaluated by an MD/DO managing patient's diabetes and diabetic shoe certification form needs to be signed by  the MD/DO. If patient's diabetes is being managed by an Endocrinologist, the Endocrinologist must evaluate patient's feet and sign the Medicare diabetic shoe certification form. 6. Patient to report any pedal injuries to medical professional immediately. 7. Follow up 3 months.  8. Patient/POA to call should there be a concern in the interim.

## 2019-01-20 ENCOUNTER — Encounter (INDEPENDENT_AMBULATORY_CARE_PROVIDER_SITE_OTHER): Payer: Medicare Other | Admitting: Ophthalmology

## 2019-01-20 DIAGNOSIS — E113292 Type 2 diabetes mellitus with mild nonproliferative diabetic retinopathy without macular edema, left eye: Secondary | ICD-10-CM

## 2019-01-20 DIAGNOSIS — I10 Essential (primary) hypertension: Secondary | ICD-10-CM | POA: Diagnosis not present

## 2019-01-20 DIAGNOSIS — E11311 Type 2 diabetes mellitus with unspecified diabetic retinopathy with macular edema: Secondary | ICD-10-CM

## 2019-01-20 DIAGNOSIS — E113211 Type 2 diabetes mellitus with mild nonproliferative diabetic retinopathy with macular edema, right eye: Secondary | ICD-10-CM | POA: Diagnosis not present

## 2019-01-20 DIAGNOSIS — H35033 Hypertensive retinopathy, bilateral: Secondary | ICD-10-CM

## 2019-01-20 DIAGNOSIS — H35371 Puckering of macula, right eye: Secondary | ICD-10-CM | POA: Diagnosis not present

## 2019-01-20 DIAGNOSIS — H43813 Vitreous degeneration, bilateral: Secondary | ICD-10-CM

## 2019-02-21 DIAGNOSIS — H40023 Open angle with borderline findings, high risk, bilateral: Secondary | ICD-10-CM | POA: Diagnosis not present

## 2019-02-21 DIAGNOSIS — H35363 Drusen (degenerative) of macula, bilateral: Secondary | ICD-10-CM | POA: Diagnosis not present

## 2019-02-21 DIAGNOSIS — E113211 Type 2 diabetes mellitus with mild nonproliferative diabetic retinopathy with macular edema, right eye: Secondary | ICD-10-CM | POA: Diagnosis not present

## 2019-02-21 DIAGNOSIS — H35371 Puckering of macula, right eye: Secondary | ICD-10-CM | POA: Diagnosis not present

## 2019-02-28 ENCOUNTER — Other Ambulatory Visit: Payer: Self-pay

## 2019-02-28 ENCOUNTER — Encounter (INDEPENDENT_AMBULATORY_CARE_PROVIDER_SITE_OTHER): Payer: Medicare Other | Admitting: Ophthalmology

## 2019-02-28 DIAGNOSIS — I10 Essential (primary) hypertension: Secondary | ICD-10-CM | POA: Diagnosis not present

## 2019-02-28 DIAGNOSIS — E113311 Type 2 diabetes mellitus with moderate nonproliferative diabetic retinopathy with macular edema, right eye: Secondary | ICD-10-CM | POA: Diagnosis not present

## 2019-02-28 DIAGNOSIS — E113292 Type 2 diabetes mellitus with mild nonproliferative diabetic retinopathy without macular edema, left eye: Secondary | ICD-10-CM | POA: Diagnosis not present

## 2019-02-28 DIAGNOSIS — E11311 Type 2 diabetes mellitus with unspecified diabetic retinopathy with macular edema: Secondary | ICD-10-CM | POA: Diagnosis not present

## 2019-02-28 DIAGNOSIS — H43813 Vitreous degeneration, bilateral: Secondary | ICD-10-CM

## 2019-02-28 DIAGNOSIS — H35033 Hypertensive retinopathy, bilateral: Secondary | ICD-10-CM | POA: Diagnosis not present

## 2019-03-15 DIAGNOSIS — R7989 Other specified abnormal findings of blood chemistry: Secondary | ICD-10-CM | POA: Diagnosis not present

## 2019-03-15 DIAGNOSIS — E1122 Type 2 diabetes mellitus with diabetic chronic kidney disease: Secondary | ICD-10-CM | POA: Diagnosis not present

## 2019-03-15 DIAGNOSIS — E538 Deficiency of other specified B group vitamins: Secondary | ICD-10-CM | POA: Diagnosis not present

## 2019-03-15 DIAGNOSIS — E559 Vitamin D deficiency, unspecified: Secondary | ICD-10-CM | POA: Diagnosis not present

## 2019-03-15 DIAGNOSIS — I5032 Chronic diastolic (congestive) heart failure: Secondary | ICD-10-CM | POA: Diagnosis not present

## 2019-03-16 ENCOUNTER — Other Ambulatory Visit: Payer: Self-pay

## 2019-03-16 ENCOUNTER — Ambulatory Visit: Payer: Medicare Other | Admitting: Podiatry

## 2019-03-16 ENCOUNTER — Encounter: Payer: Self-pay | Admitting: Podiatry

## 2019-03-16 VITALS — Temp 97.9°F

## 2019-03-16 DIAGNOSIS — M79674 Pain in right toe(s): Secondary | ICD-10-CM

## 2019-03-16 DIAGNOSIS — M79675 Pain in left toe(s): Secondary | ICD-10-CM

## 2019-03-16 DIAGNOSIS — L84 Corns and callosities: Secondary | ICD-10-CM | POA: Diagnosis not present

## 2019-03-16 DIAGNOSIS — E119 Type 2 diabetes mellitus without complications: Secondary | ICD-10-CM | POA: Diagnosis not present

## 2019-03-16 DIAGNOSIS — B351 Tinea unguium: Secondary | ICD-10-CM | POA: Diagnosis not present

## 2019-03-16 DIAGNOSIS — M2170 Unequal limb length (acquired), unspecified site: Secondary | ICD-10-CM

## 2019-03-16 NOTE — Patient Instructions (Addendum)
Corns and Calluses Corns are small areas of thickened skin that occur on the top, sides, or tip of a toe. They contain a cone-shaped core with a point that can press on a nerve below. This causes pain.  Calluses are areas of thickened skin that can occur anywhere on the body, including the hands, fingers, palms, soles of the feet, and heels. Calluses are usually larger than corns. What are the causes? Corns and calluses are caused by rubbing (friction) or pressure, such as from shoes that are too tight or do not fit properly. What increases the risk? Corns are more likely to develop in people who have misshapen toes (toe deformities), such as hammer toes. Calluses can occur with friction to any area of the skin. They are more likely to develop in people who:  Work with their hands.  Wear shoes that fit poorly, are too tight, or are high-heeled.  Have toe deformities. What are the signs or symptoms? Symptoms of a corn or callus include:  A hard growth on the skin.  Pain or tenderness under the skin.  Redness and swelling.  Increased discomfort while wearing tight-fitting shoes, if your feet are affected. If a corn or callus becomes infected, symptoms may include:  Redness and swelling that gets worse.  Pain.  Fluid, blood, or pus draining from the corn or callus. How is this diagnosed? Corns and calluses may be diagnosed based on your symptoms, your medical history, and a physical exam. How is this treated? Treatment for corns and calluses may include:  Removing the cause of the friction or pressure. This may involve: ? Changing your shoes. ? Wearing shoe inserts (orthotics) or other protective layers in your shoes, such as a corn pad. ? Wearing gloves.  Applying medicine to the skin (topical medicine) to help soften skin in the hardened, thickened areas.  Removing layers of dead skin with a file to reduce the size of the corn or callus.  Removing the corn or callus with a  scalpel or laser.  Taking antibiotic medicines, if your corn or callus is infected.  Having surgery, if a toe deformity is the cause. Follow these instructions at home:   Take over-the-counter and prescription medicines only as told by your health care provider.  If you were prescribed an antibiotic, take it as told by your health care provider. Do not stop taking it even if your condition starts to improve.  Wear shoes that fit well. Avoid wearing high-heeled shoes and shoes that are too tight or too loose.  Wear any padding, protective layers, gloves, or orthotics as told by your health care provider.  Soak your hands or feet and then use a file or pumice stone to soften your corn or callus. Do this as told by your health care provider.  Check your corn or callus every day for symptoms of infection. Contact a health care provider if you:  Notice that your symptoms do not improve with treatment.  Have redness or swelling that gets worse.  Notice that your corn or callus becomes painful.  Have fluid, blood, or pus coming from your corn or callus.  Have new symptoms. Summary  Corns are small areas of thickened skin that occur on the top, sides, or tip of a toe.  Calluses are areas of thickened skin that can occur anywhere on the body, including the hands, fingers, palms, and soles of the feet. Calluses are usually larger than corns.  Corns and calluses are caused by   rubbing (friction) or pressure, such as from shoes that are too tight or do not fit properly.  Treatment may include wearing any padding, protective layers, gloves, or orthotics as told by your health care provider. This information is not intended to replace advice given to you by your health care provider. Make sure you discuss any questions you have with your health care provider. Document Revised: 04/14/2018 Document Reviewed: 11/05/2016 Elsevier Patient Education  [place].  Diabetes Mellitus and  [place] care is an important part of your health, especially when you have diabetes. Diabetes may cause you to have problems because of poor blood flow (circulation) to your feet and legs, which can cause your skin to:  Become thinner and drier.  Break more easily.  Heal more slowly.  Peel and crack. You may also have nerve damage (neuropathy) in your legs and feet, causing decreased feeling in them. This means that you may not notice minor injuries to your feet that could lead to more serious problems. Noticing and addressing any potential problems early is the best way to prevent future foot problems. How to care for your feet Foot hygiene  Wash your feet daily with warm water and mild soap. Do not use hot water. Then, pat your feet and the areas between your toes until they are completely dry. Do not soak your feet as this can dry your skin.  Trim your toenails straight across. Do not dig under them or around the cuticle. File the edges of your nails with an emery board or nail file.  Apply a moisturizing lotion or petroleum jelly to the skin on your feet and to dry, brittle toenails. Use lotion that does not contain alcohol and is unscented. Do not apply lotion between your toes. Shoes and socks  Wear clean socks or stockings every day. Make sure they are not too tight. Do not wear knee-high stockings since they may decrease blood flow to your legs.  Wear shoes that fit properly and have enough cushioning. Always look in your shoes before you put them on to be sure there are no objects inside.  To break in new shoes, wear them for just a few hours a day. This prevents injuries on your feet. Wounds, scrapes, corns, and calluses  Check your feet daily for blisters, cuts, bruises, sores, and redness. If you cannot see the bottom of your feet, use a mirror or ask someone for help.  Do not cut corns or calluses or try to remove them with medicine.  If you find a minor scrape,  cut, or break in the skin on your feet, keep it and the skin around it clean and dry. You may clean these areas with mild soap and water. Do not clean the area with peroxide, alcohol, or iodine.  If you have a wound, scrape, corn, or callus on your foot, look at it several times a day to make sure it is healing and not infected. Check for: ? Redness, swelling, or pain. ? Fluid or blood. ? Warmth. ? Pus or a bad smell. General instructions  Do not cross your legs. This may decrease blood flow to your feet.  Do not use heating pads or hot water bottles on your feet. They may burn your skin. If you have lost feeling in your feet or legs, you may not know this is happening until it is too late.  Protect your feet from hot and cold by wearing shoes, such as at  the beach or on hot pavement.  Schedule a complete foot exam at least once a year (annually) or more often if you have foot problems. If you have foot problems, report any cuts, sores, or bruises to your health care provider immediately. Contact a health care provider if:  You have a medical condition that increases your risk of infection and you have any cuts, sores, or bruises on your feet.  You have an injury that is not healing.  You have redness on your legs or feet.  You feel burning or tingling in your legs or feet.  You have pain or cramps in your legs and feet.  Your legs or feet are numb.  Your feet always feel cold.  You have pain around a toenail. Get help right away if:  You have a wound, scrape, corn, or callus on your foot and: ? You have pain, swelling, or redness that gets worse. ? You have fluid or blood coming from the wound, scrape, corn, or callus. ? Your wound, scrape, corn, or callus feels warm to the touch. ? You have pus or a bad smell coming from the wound, scrape, corn, or callus. ? You have a fever. ? You have a red line going up your leg. Summary  Check your feet every day for cuts, sores, red  spots, swelling, and blisters.  Moisturize feet and legs daily.  Wear shoes that fit properly and have enough cushioning.  If you have foot problems, report any cuts, sores, or bruises to your health care provider immediately.  Schedule a complete foot exam at least once a year (annually) or more often if you have foot problems. This information is not intended to replace advice given to you by your health care provider. Make sure you discuss any questions you have with your health care provider. Document Revised: 09/15/2018 Document Reviewed: 01/25/2016 Elsevier Patient Education  [place].

## 2019-03-22 DIAGNOSIS — I129 Hypertensive chronic kidney disease with stage 1 through stage 4 chronic kidney disease, or unspecified chronic kidney disease: Secondary | ICD-10-CM | POA: Diagnosis not present

## 2019-03-22 DIAGNOSIS — N183 Chronic kidney disease, stage 3 unspecified: Secondary | ICD-10-CM | POA: Diagnosis not present

## 2019-03-22 DIAGNOSIS — E1122 Type 2 diabetes mellitus with diabetic chronic kidney disease: Secondary | ICD-10-CM | POA: Diagnosis not present

## 2019-03-22 DIAGNOSIS — Z0000 Encounter for general adult medical examination without abnormal findings: Secondary | ICD-10-CM | POA: Diagnosis not present

## 2019-03-23 NOTE — Progress Notes (Signed)
Subjective: Gerald [name] presents today for follow up of preventative diabetic foot care and callus(es) plantar aspect right foot and painful mycotic toenails b/l that are difficult to trim. Pain interferes with ambulation. Aggravating factors include wearing enclosed shoe gear. Pain is relieved with periodic professional debridement.Marland Kitchen   He is requesting Rx for [facility] for buildup of left shoe for his limb length discrepancy.  No Known Allergies   Objective: Vitals:   03/16/19 1102  Temp: 97.9 F (36.6 C)   Pt 74 y.o. year old AA male WD, WN in NAD. AAO x 3.   Vascular Examination:  Capillary refill time to digits immediate b/l. Faintly palpable DP pulses b/l. Faintly palpable PT pulses b/l. Pedal hair sparse b/l. Skin temperature gradient within normal limits b/l.  Dermatological Examination: Pedal skin with normal turgor, texture and tone bilaterally. No open wounds bilaterally. No interdigital macerations bilaterally. Toenails 1-5 b/l elongated, dystrophic, thickened, crumbly with subungual debris and tenderness to dorsal palpation. Hyperkeratotic lesion(s) submet head 5 right foot.  No erythema, no edema, no drainage, no flocculence.  Musculoskeletal: Normal muscle strength 5/5 to all lower extremity muscle groups bilaterally, no pain crepitus or joint limitation noted with ROM b/l, hammertoes noted to the  2-5 bilaterally and limb length discrepancy LLE.  Neurological: Protective sensation intact 5/5 intact bilaterally with 10g monofilament b/l Vibratory sensation intact b/l  Assessment: 1. Pain due to onychomycosis of toenails of both feet   2. Callus   3. Acquired unequal limb length   4. Controlled type 2 diabetes mellitus without complication, without long-term current use of insulin ([place])   Plan: -Continue diabetic foot care principles. Literature dispensed on today.  -Written Rx dispensed addressed to [facility] for buildup of left shoe for  LLE limb length discrepancy. -Toenails 1-5 b/l were debrided in length and girth with sterile nail nippers and dremel without iatrogenic bleeding.  -Calluses pared submetatarsal head(s) 5 right foot utilizing sterile scalpel blade without incident. -Patient to continue soft, supportive shoe gear daily. -Patient to report any pedal injuries to medical professional immediately. -Patient/POA to call should there be question/concern in the interim.  Return in about 3 months (around 06/16/2019) for diabetic nail and callus trim.

## 2019-03-28 ENCOUNTER — Other Ambulatory Visit: Payer: Self-pay

## 2019-03-28 ENCOUNTER — Encounter (INDEPENDENT_AMBULATORY_CARE_PROVIDER_SITE_OTHER): Payer: Medicare Other | Admitting: Ophthalmology

## 2019-03-28 DIAGNOSIS — E11311 Type 2 diabetes mellitus with unspecified diabetic retinopathy with macular edema: Secondary | ICD-10-CM

## 2019-03-28 DIAGNOSIS — E113292 Type 2 diabetes mellitus with mild nonproliferative diabetic retinopathy without macular edema, left eye: Secondary | ICD-10-CM | POA: Diagnosis not present

## 2019-03-28 DIAGNOSIS — E113211 Type 2 diabetes mellitus with mild nonproliferative diabetic retinopathy with macular edema, right eye: Secondary | ICD-10-CM

## 2019-03-28 DIAGNOSIS — I10 Essential (primary) hypertension: Secondary | ICD-10-CM

## 2019-03-28 DIAGNOSIS — H35033 Hypertensive retinopathy, bilateral: Secondary | ICD-10-CM | POA: Diagnosis not present

## 2019-03-28 DIAGNOSIS — H35371 Puckering of macula, right eye: Secondary | ICD-10-CM

## 2019-03-28 DIAGNOSIS — H43813 Vitreous degeneration, bilateral: Secondary | ICD-10-CM

## 2019-04-20 DIAGNOSIS — E559 Vitamin D deficiency, unspecified: Secondary | ICD-10-CM | POA: Diagnosis not present

## 2019-04-20 DIAGNOSIS — N183 Chronic kidney disease, stage 3 unspecified: Secondary | ICD-10-CM | POA: Diagnosis not present

## 2019-04-20 DIAGNOSIS — N189 Chronic kidney disease, unspecified: Secondary | ICD-10-CM | POA: Diagnosis not present

## 2019-04-20 DIAGNOSIS — I129 Hypertensive chronic kidney disease with stage 1 through stage 4 chronic kidney disease, or unspecified chronic kidney disease: Secondary | ICD-10-CM | POA: Diagnosis not present

## 2019-04-20 DIAGNOSIS — D631 Anemia in chronic kidney disease: Secondary | ICD-10-CM | POA: Diagnosis not present

## 2019-04-20 DIAGNOSIS — R809 Proteinuria, unspecified: Secondary | ICD-10-CM | POA: Diagnosis not present

## 2019-05-02 ENCOUNTER — Other Ambulatory Visit: Payer: Self-pay

## 2019-05-02 ENCOUNTER — Encounter (INDEPENDENT_AMBULATORY_CARE_PROVIDER_SITE_OTHER): Payer: Medicare Other | Admitting: Ophthalmology

## 2019-05-02 DIAGNOSIS — H35033 Hypertensive retinopathy, bilateral: Secondary | ICD-10-CM

## 2019-05-02 DIAGNOSIS — H43813 Vitreous degeneration, bilateral: Secondary | ICD-10-CM

## 2019-05-02 DIAGNOSIS — E113292 Type 2 diabetes mellitus with mild nonproliferative diabetic retinopathy without macular edema, left eye: Secondary | ICD-10-CM

## 2019-05-02 DIAGNOSIS — I10 Essential (primary) hypertension: Secondary | ICD-10-CM

## 2019-05-02 DIAGNOSIS — E11311 Type 2 diabetes mellitus with unspecified diabetic retinopathy with macular edema: Secondary | ICD-10-CM

## 2019-05-02 DIAGNOSIS — E113211 Type 2 diabetes mellitus with mild nonproliferative diabetic retinopathy with macular edema, right eye: Secondary | ICD-10-CM

## 2019-05-21 IMAGING — US US RENAL
1 series · 14 of 25 positions shown · non-contrast
Comparison: 02/04/2010

CLINICAL DATA: Chronic kidney disease

EXAM:
RENAL / URINARY TRACT ULTRASOUND COMPLETE

[Series 1: us renal · 0.23mm/px · 14 of 49 slices shown]
[im 1/49]
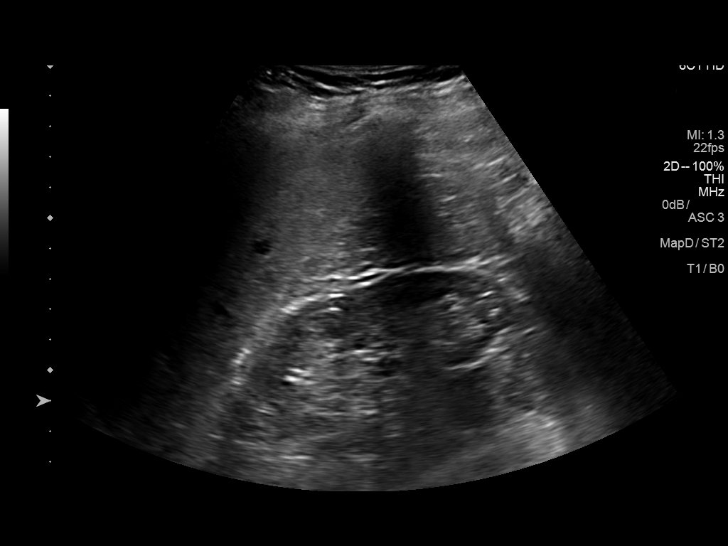
[im 5/49]
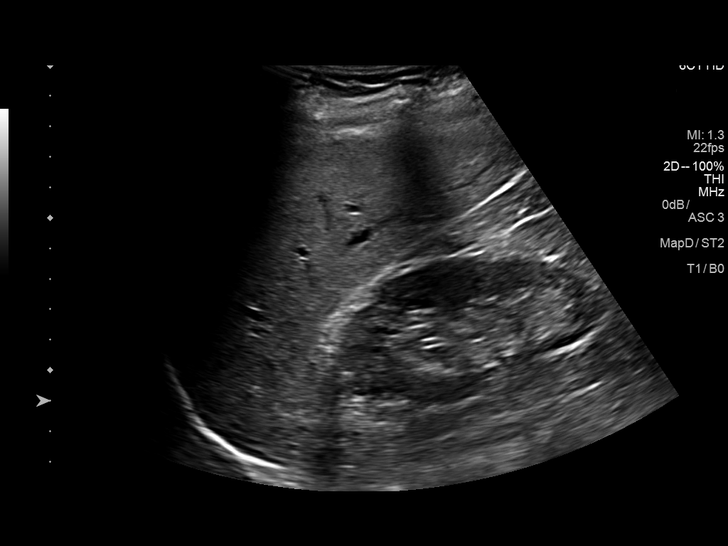
[im 9/49]
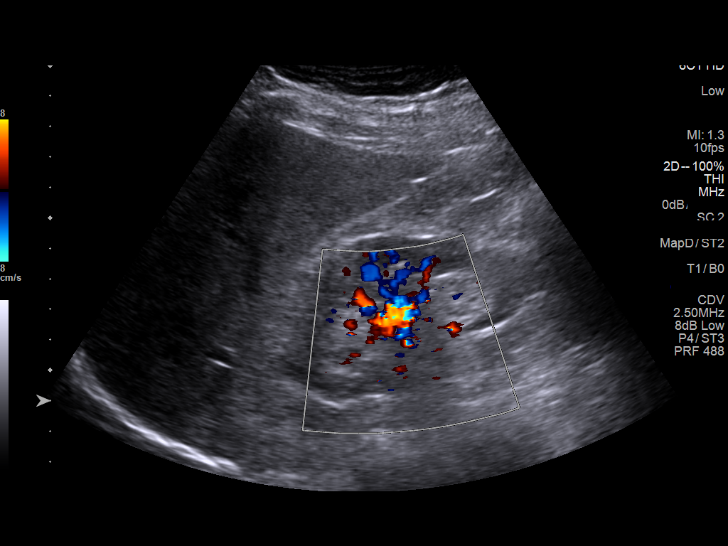
[im 13/49]
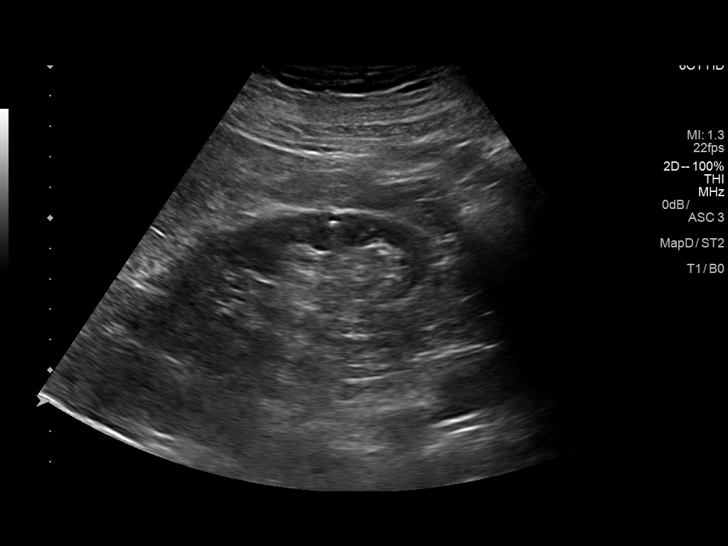
[im 17/49]
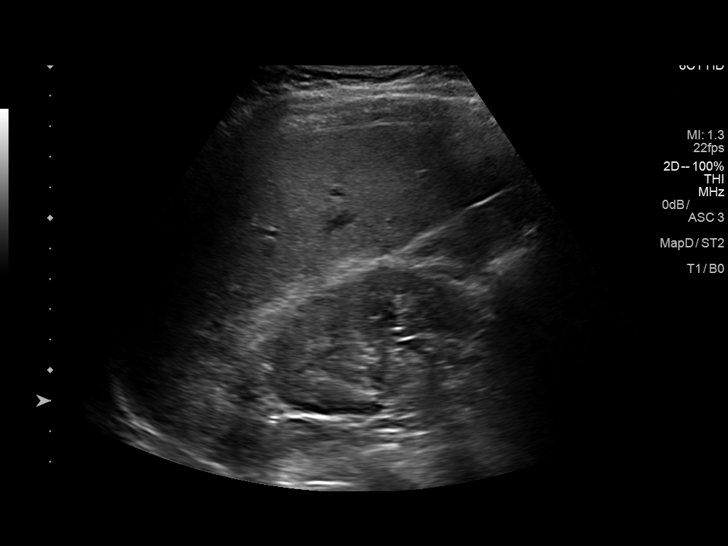
[im 19/49]
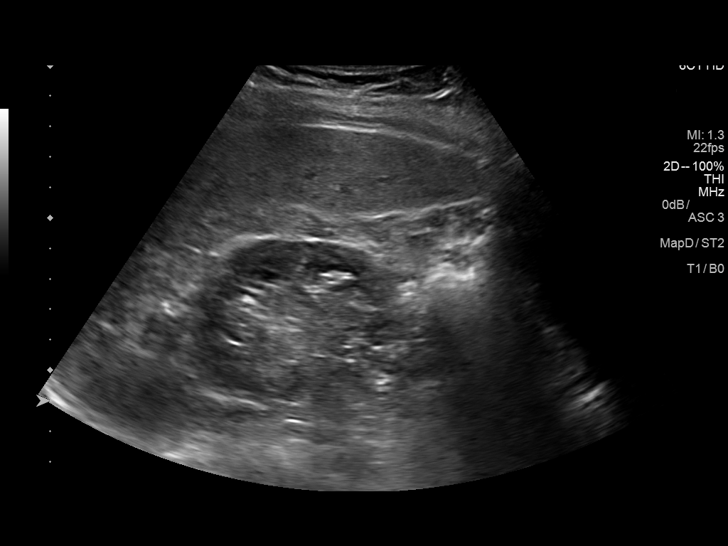
[im 23/49]
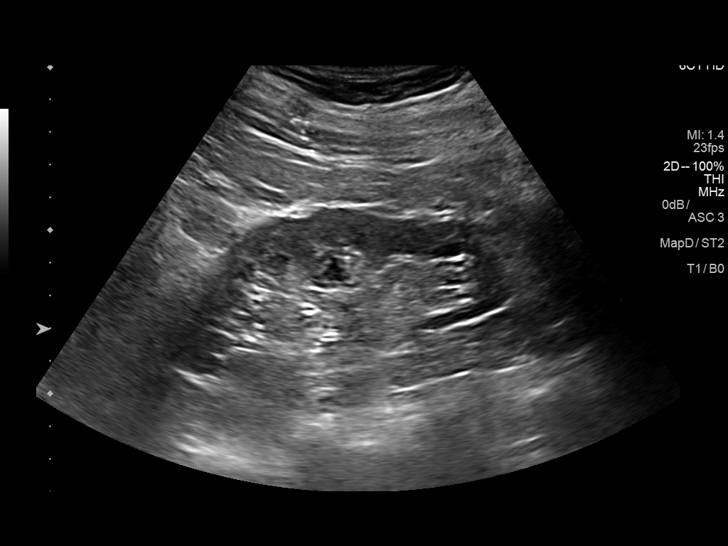
[im 27/49]
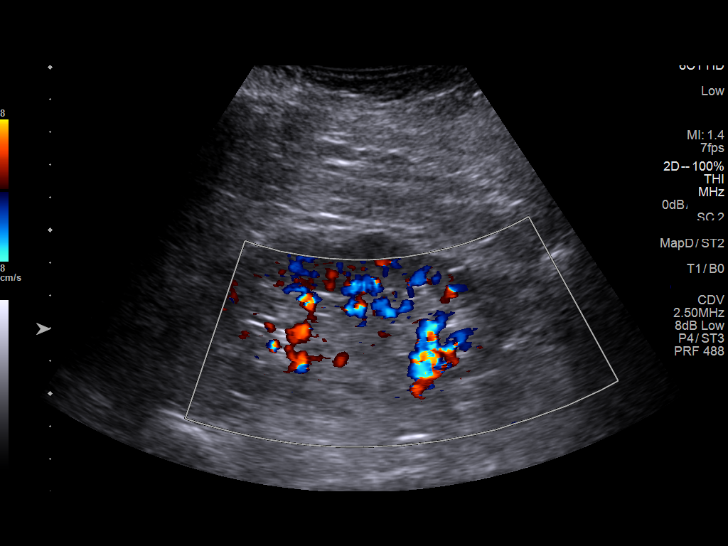
[im 31/49]
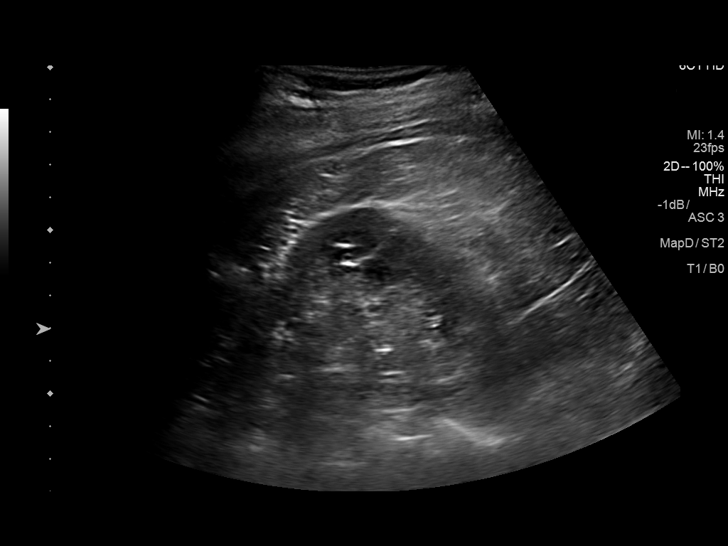
[im 33/49]
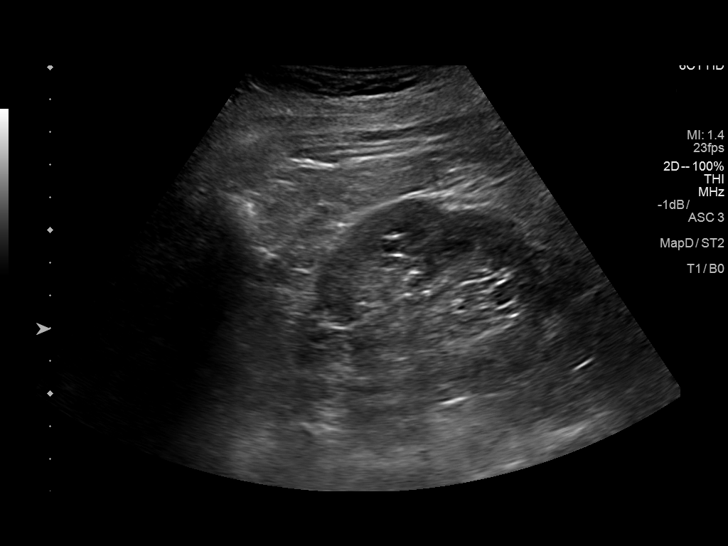
[im 37/49]
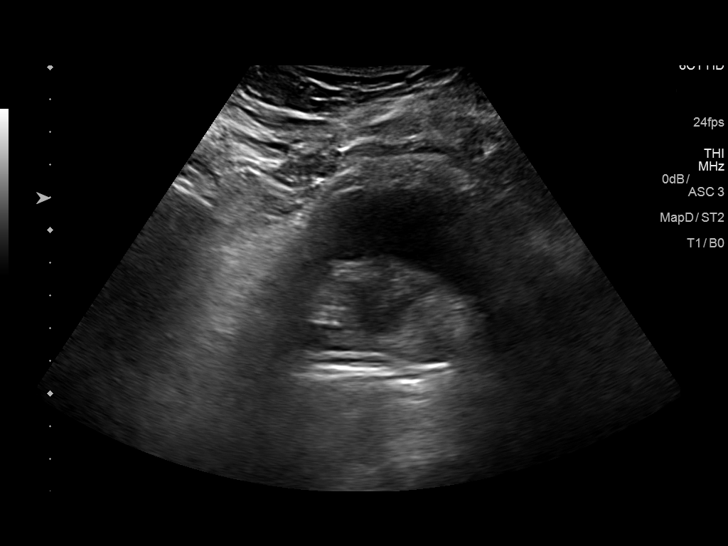
[im 41/49]
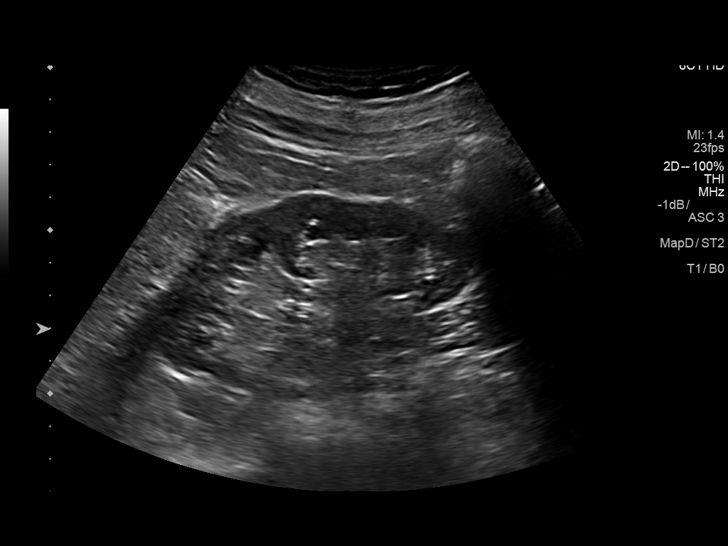
[im 45/49]
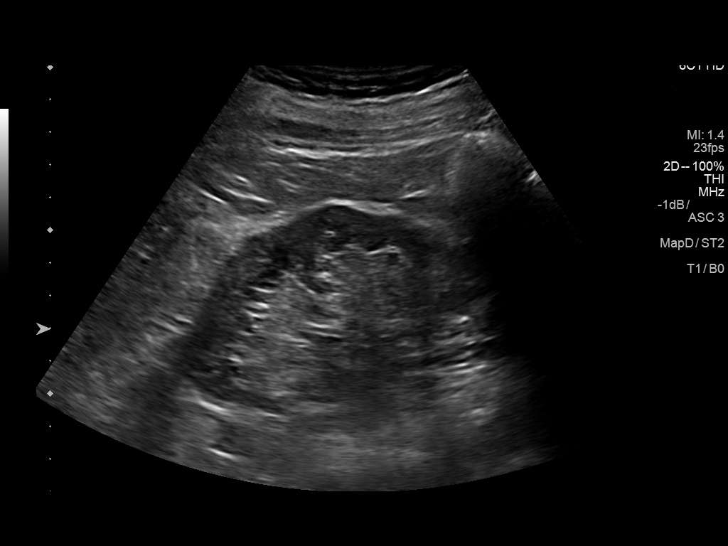
[im 49/49]
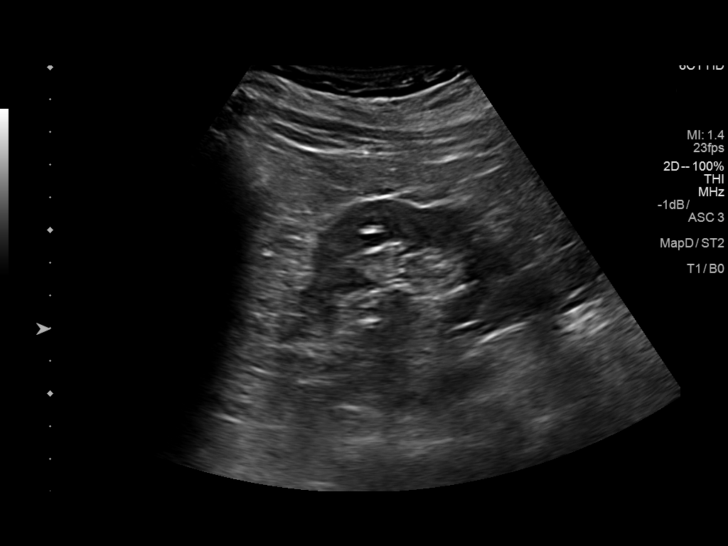

[14 of 25 positions shown; findings below may reference images not displayed]

FINDINGS: Right Kidney:

Length: 10.8 cm. Echogenicity within normal limits. No mass or
hydronephrosis visualized. Possible 6 mm stone in the midpole.
Cortical thinning present at the poles.

Left Kidney:

Length: 10.8 cm. Echogenicity within normal limits. No mass or
hydronephrosis visualized. Possible 6 mm stone in the midpole of the
left kidney. Mild cortical thinning suggested at the poles.

Bladder:

Bladder is thick walled but underdistended
IMPRESSION: 1. Negative for hydronephrosis
2. Possible old small stones in the kidneys bilaterally
3. Thick walled but underdistended bladder

## 2019-06-01 ENCOUNTER — Encounter (INDEPENDENT_AMBULATORY_CARE_PROVIDER_SITE_OTHER): Payer: Medicare Other | Admitting: Ophthalmology

## 2019-06-01 ENCOUNTER — Other Ambulatory Visit: Payer: Self-pay

## 2019-06-01 DIAGNOSIS — E113212 Type 2 diabetes mellitus with mild nonproliferative diabetic retinopathy with macular edema, left eye: Secondary | ICD-10-CM

## 2019-06-01 DIAGNOSIS — I10 Essential (primary) hypertension: Secondary | ICD-10-CM

## 2019-06-01 DIAGNOSIS — E11311 Type 2 diabetes mellitus with unspecified diabetic retinopathy with macular edema: Secondary | ICD-10-CM | POA: Diagnosis not present

## 2019-06-01 DIAGNOSIS — E113292 Type 2 diabetes mellitus with mild nonproliferative diabetic retinopathy without macular edema, left eye: Secondary | ICD-10-CM | POA: Diagnosis not present

## 2019-06-01 DIAGNOSIS — H35371 Puckering of macula, right eye: Secondary | ICD-10-CM

## 2019-06-01 DIAGNOSIS — H35033 Hypertensive retinopathy, bilateral: Secondary | ICD-10-CM

## 2019-06-01 DIAGNOSIS — H43813 Vitreous degeneration, bilateral: Secondary | ICD-10-CM

## 2019-06-21 ENCOUNTER — Ambulatory Visit: Payer: Medicare Other | Admitting: Podiatry

## 2019-06-21 ENCOUNTER — Encounter: Payer: Self-pay | Admitting: Podiatry

## 2019-06-21 ENCOUNTER — Other Ambulatory Visit: Payer: Self-pay

## 2019-06-21 DIAGNOSIS — M79674 Pain in right toe(s): Secondary | ICD-10-CM | POA: Diagnosis not present

## 2019-06-21 DIAGNOSIS — M2170 Unequal limb length (acquired), unspecified site: Secondary | ICD-10-CM

## 2019-06-21 DIAGNOSIS — N182 Chronic kidney disease, stage 2 (mild): Secondary | ICD-10-CM | POA: Diagnosis not present

## 2019-06-21 DIAGNOSIS — E0822 Diabetes mellitus due to underlying condition with diabetic chronic kidney disease: Secondary | ICD-10-CM

## 2019-06-21 DIAGNOSIS — M79675 Pain in left toe(s): Secondary | ICD-10-CM

## 2019-06-21 DIAGNOSIS — B351 Tinea unguium: Secondary | ICD-10-CM | POA: Diagnosis not present

## 2019-06-21 DIAGNOSIS — E119 Type 2 diabetes mellitus without complications: Secondary | ICD-10-CM

## 2019-06-21 DIAGNOSIS — L84 Corns and callosities: Secondary | ICD-10-CM | POA: Diagnosis not present

## 2019-06-21 NOTE — Patient Instructions (Addendum)
Diabetes Mellitus and Foot Care Foot care is an important part of your health, especially when you have diabetes. Diabetes may cause you to have problems because of poor blood flow (circulation) to your feet and legs, which can cause your skin to:  Become thinner and drier.  Break more easily.  Heal more slowly.  Peel and crack. You may also have nerve damage (neuropathy) in your legs and feet, causing decreased feeling in them. This means that you may not notice minor injuries to your feet that could lead to more serious problems. Noticing and addressing any potential problems early is the best way to prevent future foot problems. How to care for your feet Foot hygiene  Wash your feet daily with warm water and mild soap. Do not use hot water. Then, pat your feet and the areas between your toes until they are completely dry. Do not soak your feet as this can dry your skin.  Trim your toenails straight across. Do not dig under them or around the cuticle. File the edges of your nails with an emery board or nail file.  Apply a moisturizing lotion or petroleum jelly to the skin on your feet and to dry, brittle toenails. Use lotion that does not contain alcohol and is unscented. Do not apply lotion between your toes. Shoes and socks  Wear clean socks or stockings every day. Make sure they are not too tight. Do not wear knee-high stockings since they may decrease blood flow to your legs.  Wear shoes that fit properly and have enough cushioning. Always look in your shoes before you put them on to be sure there are no objects inside.  To break in new shoes, wear them for just a few hours a day. This prevents injuries on your feet. Wounds, scrapes, corns, and calluses  Check your feet daily for blisters, cuts, bruises, sores, and redness. If you cannot see the bottom of your feet, use a mirror or ask someone for help.  Do not cut corns or calluses or try to remove them with medicine.  If you  find a minor scrape, cut, or break in the skin on your feet, keep it and the skin around it clean and dry. You may clean these areas with mild soap and water. Do not clean the area with peroxide, alcohol, or iodine.  If you have a wound, scrape, corn, or callus on your foot, look at it several times a day to make sure it is healing and not infected. Check for: ? Redness, swelling, or pain. ? Fluid or blood. ? Warmth. ? Pus or a bad smell. General instructions  Do not cross your legs. This may decrease blood flow to your feet.  Do not use heating pads or hot water bottles on your feet. They may burn your skin. If you have lost feeling in your feet or legs, you may not know this is happening until it is too late.  Protect your feet from hot and cold by wearing shoes, such as at the beach or on hot pavement.  Schedule a complete foot exam at least once a year (annually) or more often if you have foot problems. If you have foot problems, report any cuts, sores, or bruises to your health care provider immediately. Contact a health care provider if:  You have a medical condition that increases your risk of infection and you have any cuts, sores, or bruises on your feet.  You have an injury that is not   healing.  You have redness on your legs or feet.  You feel burning or tingling in your legs or feet.  You have pain or cramps in your legs and feet.  Your legs or feet are numb.  Your feet always feel cold.  You have pain around a toenail. Get help right away if:  You have a wound, scrape, corn, or callus on your foot and: ? You have pain, swelling, or redness that gets worse. ? You have fluid or blood coming from the wound, scrape, corn, or callus. ? Your wound, scrape, corn, or callus feels warm to the touch. ? You have pus or a bad smell coming from the wound, scrape, corn, or callus. ? You have a fever. ? You have a red line going up your leg. Summary  Check your feet every day  for cuts, sores, red spots, swelling, and blisters.  Moisturize feet and legs daily.  Wear shoes that fit properly and have enough cushioning.  If you have foot problems, report any cuts, sores, or bruises to your health care provider immediately.  Schedule a complete foot exam at least once a year (annually) or more often if you have foot problems. This information is not intended to replace advice given to you by your health care provider. Make sure you discuss any questions you have with your health care provider. Document Revised: 09/15/2018 Document Reviewed: 01/25/2016 Elsevier Patient Education  2020 [employer]  

## 2019-06-28 NOTE — Progress Notes (Signed)
Subjective: Gerald [name] presents today preventative diabetic foot care and painful callus(es) right foot and painful mycotic toenails b/l that are difficult to trim. Pain interferes with ambulation. Aggravating factors include wearing enclosed shoe gear. Pain is relieved with periodic professional debridement.  Gerald Morning, DO is patient's PCP. Last visit was: 07/21/2018.  He states he was unable to get his lift for his right shoe because vendor wanted to supply both lift and shoes. Gerald [name] declined due to having his own new shoes.  Past Medical History:  Diagnosis Date   Abnormality of gait 10/05/2014   Anxiety    Bilateral edema of lower extremity    LE Dopplers 4/22 - no thrombus or thrombophelbitis, no reflux   Colon polyp    DDD (degenerative disc disease)    Depression    Diabetes mellitus without complication ([name])    controlled by diet   DJD (degenerative joint disease)    DOE (dyspnea on exertion)    Echo 04 /22/2014 - normal EF, mild LVH, grade 1 Diastolic Dysfunction; Lexiscan Myoview 04/28/12 - no ishcemia or infarction, LBBB septal wall motion; EF ~60%   Fatigue    GERD (gastroesophageal reflux disease)    H/O iron deficiency anemia    with  hemmorrhoids   Hyperlipidemia    Hypertension    Left bundle branch block     chronic, negative Myoview a normal echo as noted above    Osteoarthritis    with bilateral hip surgeries and multiple complication, with the most recent  surgery in January 2012   Scrotal edema    Vitamin D deficiency      Current Outpatient Medications on File Prior to Visit  Medication Sig Dispense Refill   ALPRAZolam (XANAX) 0.25 MG tablet      amLODipine (NORVASC) 10 MG tablet      aspirin 81 MG tablet Take 81 mg by mouth daily.     atorvastatin (LIPITOR) 10 MG tablet      bisoprolol-hydrochlorothiazide (ZIAC) 5-6.25 MG per tablet Take 0.5 tablets by mouth daily. 90 tablet 1   buPROPion (WELLBUTRIN XL) 150 MG 24  hr tablet TAKE 1 TABLET (150 MG TOTAL) BY MOUTH EVERY [name]. 90 tablet 0   buPROPion (WELLBUTRIN XL) 300 MG 24 hr tablet      Cholecalciferol (VITAMIN D3) 5000 UNITS CAPS Take 5,000 Units by mouth daily.      donepezil (ARICEPT) 5 MG tablet Take by mouth.     doxazosin (CARDURA) 8 MG tablet Take 1 tablet (8 mg total) by mouth daily. 90 tablet 3   enalapril (VASOTEC) 20 MG tablet Take 20 mg by mouth 2 (two) times daily.     Ferrous Sulfate (IRON) 325 (65 FE) MG TABS Take 65 mg by mouth daily.      Fiber, Guar Gum, CHEW Chew 1 capsule by mouth daily.     FLUoxetine (PROZAC) 40 MG capsule Take 1 capsule (40 mg total) by mouth daily. 90 capsule 0   furosemide (LASIX) 40 MG tablet Take 1 tablet (40 mg total) by mouth 2 (two) times daily. 180 tablet 3   Magnesium 250 MG TABS Take 1 tablet by mouth daily.      Melatonin 5 MG SUBL Place 5 mg under the tongue at bedtime as needed (sleep).      meloxicam (MOBIC) 15 MG tablet Take 1 tablet (15 mg total) by mouth daily. 90 tablet 1   Multiple Vitamin (MULTIVITAMIN WITH MINERALS) TABS tablet Take 1 tablet  by mouth daily.     nystatin (MYCOSTATIN/NYSTOP) 100000 UNIT/GM POWD Apply 1 g topically 2 (two) times daily as needed (rash).     omeprazole (PRILOSEC) 40 MG capsule Take 1 capsule (40 mg total) by mouth daily. 90 capsule 1   polyethylene glycol (MIRALAX / GLYCOLAX) packet Take 17 g by mouth daily.      QUEtiapine (SEROQUEL) 200 MG tablet Take 1 tablet (200 mg total) by mouth at bedtime. 90 tablet 0   ranitidine (ZANTAC) 75 MG tablet Take by mouth.     Sennosides (EX-LAX PO) Take 1 tablet by mouth daily as needed (constipation).     vitamin B-12 (CYANOCOBALAMIN) 1000 MCG tablet Take 1,000 mcg by mouth daily.     ZINC OXIDE, TOPICAL, (DIAPER RASH) 10 % CREA Apply 1 application topically as needed (for rash).     No current facility-administered medications on file prior to visit.     No Known Allergies  Objective: Gerald [name] is a pleasant 74 y.o. male WD, WN in NAD. AAO x 3.  There were no vitals filed for this visit.  Vascular Examination: Capillary refill time to digits immediate b/l. Faintly palpable pedal pulses b/l. Pedal hair sparse. Lower extremity skin temperature gradient within normal limits.  Dermatological Examination: Pedal skin with normal turgor, texture and tone bilaterally. No open wounds bilaterally. No interdigital macerations bilaterally. Toenails 1-5 b/l elongated, discolored, dystrophic, thickened, crumbly with subungual debris and tenderness to dorsal palpation. Hyperkeratotic lesion(s) submet head 5 right foot.  No erythema, no edema, no drainage, no flocculence.  Musculoskeletal: Normal muscle strength 5/5 to all lower extremity muscle groups bilaterally. No pain crepitus or joint limitation noted with ROM b/l. Hammertoes noted to the 2-5 bilaterally. Limb length discrepancy LLE.  Neurological Examination: Protective sensation intact 5/5 intact bilaterally with 10g monofilament b/l. Vibratory sensation intact b/l. Proprioception intact bilaterally.  Assessment: 1. Pain due to onychomycosis of toenails of both feet   2. Callus   3. Acquired unequal limb length   4. 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 -No new findings. No new orders. -Toenails 1-5 b/l were debrided in length and girth with sterile nail nippers and dremel without iatrogenic bleeding.  -Callus(es) submet head 5 right foot pared utilizing sterile scalpel blade without complication or incident. Total number debrided =1. -Patient to report any pedal injuries to medical professional immediately. -Patient to continue soft, supportive shoe gear daily. -Patient/POA to call should there be question/concern in the interim.  Return in about 3 months (around 09/21/2019) for diabetic nail and callus  trim.  Marzetta Board, DPM

## 2019-06-29 ENCOUNTER — Encounter (INDEPENDENT_AMBULATORY_CARE_PROVIDER_SITE_OTHER): Payer: Medicare Other | Admitting: Ophthalmology

## 2019-06-29 ENCOUNTER — Other Ambulatory Visit: Payer: Self-pay

## 2019-06-29 DIAGNOSIS — I10 Essential (primary) hypertension: Secondary | ICD-10-CM | POA: Diagnosis not present

## 2019-06-29 DIAGNOSIS — H43813 Vitreous degeneration, bilateral: Secondary | ICD-10-CM

## 2019-06-29 DIAGNOSIS — E11311 Type 2 diabetes mellitus with unspecified diabetic retinopathy with macular edema: Secondary | ICD-10-CM

## 2019-06-29 DIAGNOSIS — E113292 Type 2 diabetes mellitus with mild nonproliferative diabetic retinopathy without macular edema, left eye: Secondary | ICD-10-CM | POA: Diagnosis not present

## 2019-06-29 DIAGNOSIS — H35033 Hypertensive retinopathy, bilateral: Secondary | ICD-10-CM

## 2019-06-29 DIAGNOSIS — E113211 Type 2 diabetes mellitus with mild nonproliferative diabetic retinopathy with macular edema, right eye: Secondary | ICD-10-CM

## 2019-07-21 ENCOUNTER — Ambulatory Visit
Admission: EM | Admit: 2019-07-21 | Discharge: 2019-07-21 | Discharge status: Against Medical Advice | Payer: Medicare Other

## 2019-07-27 ENCOUNTER — Ambulatory Visit: Payer: Medicare Other | Attending: Internal Medicine

## 2019-07-27 DIAGNOSIS — Z20822 Contact with and (suspected) exposure to covid-19: Secondary | ICD-10-CM

## 2019-07-28 LAB — SARS-COV-2, NAA 2 DAY TAT

## 2019-07-28 LAB — NOVEL CORONAVIRUS, NAA: SARS-CoV-2, NAA: NOT DETECTED

## 2019-08-03 ENCOUNTER — Other Ambulatory Visit: Payer: Self-pay

## 2019-08-03 ENCOUNTER — Encounter (INDEPENDENT_AMBULATORY_CARE_PROVIDER_SITE_OTHER): Payer: Medicare Other | Admitting: Ophthalmology

## 2019-08-03 DIAGNOSIS — I10 Essential (primary) hypertension: Secondary | ICD-10-CM

## 2019-08-03 DIAGNOSIS — E113211 Type 2 diabetes mellitus with mild nonproliferative diabetic retinopathy with macular edema, right eye: Secondary | ICD-10-CM | POA: Diagnosis not present

## 2019-08-03 DIAGNOSIS — E113292 Type 2 diabetes mellitus with mild nonproliferative diabetic retinopathy without macular edema, left eye: Secondary | ICD-10-CM

## 2019-08-03 DIAGNOSIS — H35033 Hypertensive retinopathy, bilateral: Secondary | ICD-10-CM | POA: Diagnosis not present

## 2019-08-03 DIAGNOSIS — E11311 Type 2 diabetes mellitus with unspecified diabetic retinopathy with macular edema: Secondary | ICD-10-CM

## 2019-08-03 DIAGNOSIS — H43813 Vitreous degeneration, bilateral: Secondary | ICD-10-CM

## 2019-08-31 ENCOUNTER — Encounter (INDEPENDENT_AMBULATORY_CARE_PROVIDER_SITE_OTHER): Payer: Medicare Other | Admitting: Ophthalmology

## 2019-09-13 ENCOUNTER — Other Ambulatory Visit: Payer: Self-pay

## 2019-09-13 ENCOUNTER — Encounter (INDEPENDENT_AMBULATORY_CARE_PROVIDER_SITE_OTHER): Payer: Medicare Other | Admitting: Ophthalmology

## 2019-09-13 DIAGNOSIS — E113292 Type 2 diabetes mellitus with mild nonproliferative diabetic retinopathy without macular edema, left eye: Secondary | ICD-10-CM

## 2019-09-13 DIAGNOSIS — E11311 Type 2 diabetes mellitus with unspecified diabetic retinopathy with macular edema: Secondary | ICD-10-CM

## 2019-09-13 DIAGNOSIS — H43813 Vitreous degeneration, bilateral: Secondary | ICD-10-CM

## 2019-09-13 DIAGNOSIS — E113311 Type 2 diabetes mellitus with moderate nonproliferative diabetic retinopathy with macular edema, right eye: Secondary | ICD-10-CM

## 2019-09-13 DIAGNOSIS — I10 Essential (primary) hypertension: Secondary | ICD-10-CM | POA: Diagnosis not present

## 2019-09-13 DIAGNOSIS — H35033 Hypertensive retinopathy, bilateral: Secondary | ICD-10-CM

## 2019-09-15 DIAGNOSIS — E1122 Type 2 diabetes mellitus with diabetic chronic kidney disease: Secondary | ICD-10-CM | POA: Diagnosis not present

## 2019-09-15 DIAGNOSIS — I129 Hypertensive chronic kidney disease with stage 1 through stage 4 chronic kidney disease, or unspecified chronic kidney disease: Secondary | ICD-10-CM | POA: Diagnosis not present

## 2019-09-21 DIAGNOSIS — D631 Anemia in chronic kidney disease: Secondary | ICD-10-CM | POA: Diagnosis not present

## 2019-09-21 DIAGNOSIS — N179 Acute kidney failure, unspecified: Secondary | ICD-10-CM | POA: Diagnosis not present

## 2019-09-21 DIAGNOSIS — R809 Proteinuria, unspecified: Secondary | ICD-10-CM | POA: Diagnosis not present

## 2019-09-21 DIAGNOSIS — I129 Hypertensive chronic kidney disease with stage 1 through stage 4 chronic kidney disease, or unspecified chronic kidney disease: Secondary | ICD-10-CM | POA: Diagnosis not present

## 2019-09-21 DIAGNOSIS — N183 Chronic kidney disease, stage 3 unspecified: Secondary | ICD-10-CM | POA: Diagnosis not present

## 2019-09-21 DIAGNOSIS — N189 Chronic kidney disease, unspecified: Secondary | ICD-10-CM | POA: Diagnosis not present

## 2019-09-22 DIAGNOSIS — E1122 Type 2 diabetes mellitus with diabetic chronic kidney disease: Secondary | ICD-10-CM | POA: Diagnosis not present

## 2019-09-22 DIAGNOSIS — E11319 Type 2 diabetes mellitus with unspecified diabetic retinopathy without macular edema: Secondary | ICD-10-CM | POA: Diagnosis not present

## 2019-09-22 DIAGNOSIS — E119 Type 2 diabetes mellitus without complications: Secondary | ICD-10-CM | POA: Diagnosis not present

## 2019-09-22 DIAGNOSIS — I129 Hypertensive chronic kidney disease with stage 1 through stage 4 chronic kidney disease, or unspecified chronic kidney disease: Secondary | ICD-10-CM | POA: Diagnosis not present

## 2019-09-30 ENCOUNTER — Ambulatory Visit: Payer: Medicare Other | Admitting: Podiatry

## 2019-09-30 ENCOUNTER — Other Ambulatory Visit: Payer: Self-pay

## 2019-09-30 DIAGNOSIS — N182 Chronic kidney disease, stage 2 (mild): Secondary | ICD-10-CM | POA: Diagnosis not present

## 2019-09-30 DIAGNOSIS — M79674 Pain in right toe(s): Secondary | ICD-10-CM

## 2019-09-30 DIAGNOSIS — B351 Tinea unguium: Secondary | ICD-10-CM | POA: Diagnosis not present

## 2019-09-30 DIAGNOSIS — L84 Corns and callosities: Secondary | ICD-10-CM

## 2019-09-30 DIAGNOSIS — M79675 Pain in left toe(s): Secondary | ICD-10-CM

## 2019-09-30 DIAGNOSIS — E0822 Diabetes mellitus due to underlying condition with diabetic chronic kidney disease: Secondary | ICD-10-CM | POA: Diagnosis not present

## 2019-10-03 DIAGNOSIS — Z23 Encounter for immunization: Secondary | ICD-10-CM | POA: Diagnosis not present

## 2019-10-03 DIAGNOSIS — H40023 Open angle with borderline findings, high risk, bilateral: Secondary | ICD-10-CM | POA: Diagnosis not present

## 2019-10-04 ENCOUNTER — Encounter: Payer: Self-pay | Admitting: Podiatry

## 2019-10-04 NOTE — Progress Notes (Signed)
0Subjective: [name] presents today preventative diabetic foot care and painful callus(es) right foot and painful mycotic toenails b/l that are difficult to trim. Pain interferes with ambulation. Aggravating factors include wearing enclosed shoe gear. Pain is relieved with periodic professional debridement.  [name], DO is patient's PCP. Last visit was: about 3 weeks ago.  He has not checked his blood sugar.  He states he still has not gotten the build up on his left shoe.  Past Medical History:  Diagnosis Date   Abnormality of gait 10/05/2014   Anxiety    Bilateral edema of lower extremity    LE Dopplers 4/22 - no thrombus or thrombophelbitis, no reflux   Colon polyp    DDD (degenerative disc disease)    Depression    Diabetes mellitus without complication ([name])    controlled by diet   DJD (degenerative joint disease)    DOE (dyspnea on exertion)    Echo 04 /22/2014 - normal EF, mild LVH, grade 1 Diastolic Dysfunction; Lexiscan Myoview 04/28/12 - no ishcemia or infarction, LBBB septal wall motion; EF ~60%   Fatigue    GERD (gastroesophageal reflux disease)    H/O iron deficiency anemia    with  hemmorrhoids   Hyperlipidemia    Hypertension    Left bundle branch block     chronic, negative Myoview a normal echo as noted above    Osteoarthritis    with bilateral hip surgeries and multiple complication, with the most recent  surgery in January 2012   Scrotal edema    Vitamin D deficiency      Current Outpatient Medications on File Prior to Visit  Medication Sig Dispense Refill   ALPRAZolam (XANAX) 0.25 MG tablet      amLODipine (NORVASC) 10 MG tablet      aspirin 81 MG tablet Take 81 mg by mouth daily.     atorvastatin (LIPITOR) 10 MG tablet      bisoprolol-hydrochlorothiazide (ZIAC) 5-6.25 MG per tablet Take 0.5 tablets by mouth daily. 90 tablet 1   buPROPion (WELLBUTRIN XL) 150 MG 24 hr tablet TAKE 1 TABLET (150 MG TOTAL) BY MOUTH EVERY  MORNING. 90 tablet 0   buPROPion (WELLBUTRIN XL) 300 MG 24 hr tablet      Cholecalciferol (VITAMIN D3) 5000 UNITS CAPS Take 5,000 Units by mouth daily.      donepezil (ARICEPT) 5 MG tablet Take by mouth.     doxazosin (CARDURA) 8 MG tablet Take 1 tablet (8 mg total) by mouth daily. 90 tablet 3   enalapril (VASOTEC) 20 MG tablet Take 20 mg by mouth 2 (two) times daily.     Ferrous Sulfate (IRON) 325 (65 FE) MG TABS Take 65 mg by mouth daily.      Fiber, Guar Gum, CHEW Chew 1 capsule by mouth daily.     FLUoxetine (PROZAC) 40 MG capsule Take 1 capsule (40 mg total) by mouth daily. 90 capsule 0   furosemide (LASIX) 40 MG tablet Take 1 tablet (40 mg total) by mouth 2 (two) times daily. 180 tablet 3   Magnesium 250 MG TABS Take 1 tablet by mouth daily.      Melatonin 5 MG SUBL Place 5 mg under the tongue at bedtime as needed (sleep).      meloxicam (MOBIC) 15 MG tablet Take 1 tablet (15 mg total) by mouth daily. 90 tablet 1   Multiple Vitamin (MULTIVITAMIN WITH MINERALS) TABS tablet Take 1 tablet by mouth daily.  nystatin (MYCOSTATIN/NYSTOP) 100000 UNIT/GM POWD Apply 1 g topically 2 (two) times daily as needed (rash).     omeprazole (PRILOSEC) 40 MG capsule Take 1 capsule (40 mg total) by mouth daily. 90 capsule 1   polyethylene glycol (MIRALAX / GLYCOLAX) packet Take 17 g by mouth daily.      PROLENSA 0.07 % SOLN Place 1 drop into the right eye at bedtime.     QUEtiapine (SEROQUEL) 200 MG tablet Take 1 tablet (200 mg total) by mouth at bedtime. 90 tablet 0   ranitidine (ZANTAC) 75 MG tablet Take by mouth.     Sennosides (EX-LAX PO) Take 1 tablet by mouth daily as needed (constipation).     vitamin B-12 (CYANOCOBALAMIN) 1000 MCG tablet Take 1,000 mcg by mouth daily.     ZINC OXIDE, TOPICAL, (DIAPER RASH) 10 % CREA Apply 1 application topically as needed (for rash).     No current facility-administered medications on file prior to visit.     No Known  Allergies  Objective: Gerald [name] is a pleasant 74 y.o. male WD, WN in NAD. AAO x 3.  There were no vitals filed for this visit.  Vascular Examination: Capillary refill time to digits immediate b/l. Faintly palpable pedal pulses b/l. Pedal hair sparse. Lower extremity skin temperature gradient within normal limits.  Dermatological Examination: Pedal skin with normal turgor, texture and tone bilaterally. No open wounds bilaterally. No interdigital macerations bilaterally. Toenails 1-5 b/l elongated, discolored, dystrophic, thickened, crumbly with subungual debris and tenderness to dorsal palpation. Hyperkeratotic lesion(s) submet head 5 right foot.  No erythema, no edema, no drainage, no flocculence.  Musculoskeletal: Normal muscle strength 5/5 to all lower extremity muscle groups bilaterally. No pain crepitus or joint limitation noted with ROM b/l. Hammertoes noted to the 2-5 bilaterally. Limb length discrepancy LLE.  Neurological Examination: Protective sensation intact 5/5 intact bilaterally with 10g monofilament b/l. Vibratory sensation intact b/l. Proprioception intact bilaterally.  Assessment: 1. Pain due to onychomycosis of toenails of both feet   2. Callus   3. 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 -No new findings. No new orders. -He will work on getting a build up on his new shoe. -Toenails 1-5 b/l were debrided in length and girth with sterile nail nippers and dremel without iatrogenic bleeding.  -Callus(es) submet head 5 right foot pared utilizing sterile scalpel blade without complication or incident. Total number debrided =1. -Patient to report any pedal injuries to medical professional immediately. -Patient to continue soft, supportive shoe gear daily. -Patient/POA to call should there be question/concern in the interim.  Return in about 3  months (around 12/30/2019).  Marzetta Board, DPM

## 2019-10-20 ENCOUNTER — Other Ambulatory Visit: Payer: Self-pay

## 2019-10-20 ENCOUNTER — Encounter (INDEPENDENT_AMBULATORY_CARE_PROVIDER_SITE_OTHER): Payer: Medicare Other | Admitting: Ophthalmology

## 2019-10-20 DIAGNOSIS — H43813 Vitreous degeneration, bilateral: Secondary | ICD-10-CM

## 2019-10-20 DIAGNOSIS — I10 Essential (primary) hypertension: Secondary | ICD-10-CM

## 2019-10-20 DIAGNOSIS — E113211 Type 2 diabetes mellitus with mild nonproliferative diabetic retinopathy with macular edema, right eye: Secondary | ICD-10-CM

## 2019-10-20 DIAGNOSIS — H35371 Puckering of macula, right eye: Secondary | ICD-10-CM

## 2019-10-20 DIAGNOSIS — E11311 Type 2 diabetes mellitus with unspecified diabetic retinopathy with macular edema: Secondary | ICD-10-CM | POA: Diagnosis not present

## 2019-10-20 DIAGNOSIS — E113292 Type 2 diabetes mellitus with mild nonproliferative diabetic retinopathy without macular edema, left eye: Secondary | ICD-10-CM

## 2019-10-20 DIAGNOSIS — H35033 Hypertensive retinopathy, bilateral: Secondary | ICD-10-CM | POA: Diagnosis not present

## 2019-12-08 ENCOUNTER — Other Ambulatory Visit: Payer: Self-pay

## 2019-12-08 ENCOUNTER — Encounter (INDEPENDENT_AMBULATORY_CARE_PROVIDER_SITE_OTHER): Payer: Medicare Other | Admitting: Ophthalmology

## 2019-12-08 DIAGNOSIS — E11311 Type 2 diabetes mellitus with unspecified diabetic retinopathy with macular edema: Secondary | ICD-10-CM

## 2019-12-08 DIAGNOSIS — I10 Essential (primary) hypertension: Secondary | ICD-10-CM | POA: Diagnosis not present

## 2019-12-08 DIAGNOSIS — H35033 Hypertensive retinopathy, bilateral: Secondary | ICD-10-CM

## 2019-12-08 DIAGNOSIS — E113211 Type 2 diabetes mellitus with mild nonproliferative diabetic retinopathy with macular edema, right eye: Secondary | ICD-10-CM

## 2019-12-08 DIAGNOSIS — E113292 Type 2 diabetes mellitus with mild nonproliferative diabetic retinopathy without macular edema, left eye: Secondary | ICD-10-CM | POA: Diagnosis not present

## 2019-12-08 DIAGNOSIS — H43813 Vitreous degeneration, bilateral: Secondary | ICD-10-CM

## 2020-01-04 ENCOUNTER — Ambulatory Visit: Payer: Medicare Other | Admitting: Podiatry

## 2020-01-04 ENCOUNTER — Encounter: Payer: Self-pay | Admitting: Podiatry

## 2020-01-04 ENCOUNTER — Other Ambulatory Visit: Payer: Self-pay

## 2020-01-04 DIAGNOSIS — I422 Other hypertrophic cardiomyopathy: Secondary | ICD-10-CM | POA: Insufficient documentation

## 2020-01-04 DIAGNOSIS — E11319 Type 2 diabetes mellitus with unspecified diabetic retinopathy without macular edema: Secondary | ICD-10-CM | POA: Insufficient documentation

## 2020-01-04 DIAGNOSIS — I447 Left bundle-branch block, unspecified: Secondary | ICD-10-CM | POA: Insufficient documentation

## 2020-01-04 DIAGNOSIS — I059 Rheumatic mitral valve disease, unspecified: Secondary | ICD-10-CM | POA: Insufficient documentation

## 2020-01-04 DIAGNOSIS — N183 Chronic kidney disease, stage 3 unspecified: Secondary | ICD-10-CM | POA: Insufficient documentation

## 2020-01-04 DIAGNOSIS — E538 Deficiency of other specified B group vitamins: Secondary | ICD-10-CM | POA: Insufficient documentation

## 2020-01-04 DIAGNOSIS — B351 Tinea unguium: Secondary | ICD-10-CM | POA: Diagnosis not present

## 2020-01-04 DIAGNOSIS — M79674 Pain in right toe(s): Secondary | ICD-10-CM | POA: Diagnosis not present

## 2020-01-04 DIAGNOSIS — G4700 Insomnia, unspecified: Secondary | ICD-10-CM | POA: Insufficient documentation

## 2020-01-04 DIAGNOSIS — N182 Chronic kidney disease, stage 2 (mild): Secondary | ICD-10-CM | POA: Diagnosis not present

## 2020-01-04 DIAGNOSIS — E0822 Diabetes mellitus due to underlying condition with diabetic chronic kidney disease: Secondary | ICD-10-CM | POA: Diagnosis not present

## 2020-01-04 DIAGNOSIS — L84 Corns and callosities: Secondary | ICD-10-CM | POA: Diagnosis not present

## 2020-01-04 DIAGNOSIS — F339 Major depressive disorder, recurrent, unspecified: Secondary | ICD-10-CM | POA: Insufficient documentation

## 2020-01-04 DIAGNOSIS — M1610 Unilateral primary osteoarthritis, unspecified hip: Secondary | ICD-10-CM | POA: Insufficient documentation

## 2020-01-04 DIAGNOSIS — E119 Type 2 diabetes mellitus without complications: Secondary | ICD-10-CM

## 2020-01-04 DIAGNOSIS — M79675 Pain in left toe(s): Secondary | ICD-10-CM | POA: Diagnosis not present

## 2020-01-04 DIAGNOSIS — N529 Male erectile dysfunction, unspecified: Secondary | ICD-10-CM | POA: Insufficient documentation

## 2020-01-04 DIAGNOSIS — I5032 Chronic diastolic (congestive) heart failure: Secondary | ICD-10-CM | POA: Insufficient documentation

## 2020-01-04 DIAGNOSIS — F411 Generalized anxiety disorder: Secondary | ICD-10-CM | POA: Insufficient documentation

## 2020-01-04 DIAGNOSIS — E1121 Type 2 diabetes mellitus with diabetic nephropathy: Secondary | ICD-10-CM | POA: Insufficient documentation

## 2020-01-04 DIAGNOSIS — M2170 Unequal limb length (acquired), unspecified site: Secondary | ICD-10-CM

## 2020-01-04 DIAGNOSIS — N400 Enlarged prostate without lower urinary tract symptoms: Secondary | ICD-10-CM | POA: Insufficient documentation

## 2020-01-04 HISTORY — DX: Type 2 diabetes mellitus without complications: E11.9

## 2020-01-06 NOTE — Progress Notes (Signed)
0Subjective: Gerald [name] presents today preventative diabetic foot care and painful callus(es) right foot and painful mycotic toenails b/l that are difficult to trim. Pain interferes with ambulation. Aggravating factors include wearing enclosed shoe gear. Pain is relieved with periodic professional debridement.  Gerald Reichmann, DO is patient's PCP. Last visit was: about 3 weeks ago.  He has not checked his blood sugar.  He voices no new pedal problems on today's visit.  Past Medical History:  Diagnosis Date  . Abnormality of gait 10/05/2014  . Anxiety   . Bilateral edema of lower extremity    LE Dopplers 4/22 - no thrombus or thrombophelbitis, no reflux  . Colon polyp   . DDD (degenerative disc disease)   . Depression   . Diabetes mellitus without complication ([name])    controlled by diet  . DJD (degenerative joint disease)   . DOE (dyspnea on exertion)    Echo 04 /22/2014 - normal EF, mild LVH, grade 1 Diastolic Dysfunction; Lexiscan Myoview 04/28/12 - no ishcemia or infarction, LBBB septal wall motion; EF ~60%  . Fatigue   . GERD (gastroesophageal reflux disease)   . H/O iron deficiency anemia    with  hemmorrhoids  . Hyperlipidemia   . Hypertension   . Left bundle branch block     chronic, negative Myoview a normal echo as noted above   . Osteoarthritis    with bilateral hip surgeries and multiple complication, with the most recent  surgery in January 2012  . Scrotal edema   . Vitamin D deficiency      Current Outpatient Medications on File Prior to Visit  Medication Sig Dispense Refill  . ALPRAZolam (XANAX) 0.25 MG tablet     . amLODipine (NORVASC) 10 MG tablet     . ascorbic acid (VITAMIN C) 500 MG tablet 1 tablet    . aspirin 81 MG tablet Take 81 mg by mouth daily.    Marland Kitchen atorvastatin (LIPITOR) 10 MG tablet     . bisoprolol-hydrochlorothiazide (ZIAC) 5-6.25 MG per tablet Take 0.5 tablets by mouth daily. 90 tablet 1  . buPROPion (WELLBUTRIN XL) 150 MG 24 hr tablet  TAKE 1 TABLET (150 MG TOTAL) BY MOUTH EVERY MORNING. 90 tablet 0  . buPROPion (WELLBUTRIN XL) 300 MG 24 hr tablet     . Cholecalciferol (VITAMIN D3) 5000 UNITS CAPS Take 5,000 Units by mouth daily.     Marland Kitchen donepezil (ARICEPT) 5 MG tablet Take by mouth.    . doxazosin (CARDURA) 8 MG tablet Take 1 tablet (8 mg total) by mouth daily. 90 tablet 3  . enalapril (VASOTEC) 20 MG tablet Take 20 mg by mouth 2 (two) times daily.    . Ferrous Sulfate (IRON) 325 (65 FE) MG TABS Take 65 mg by mouth daily.     . Fiber, Guar Gum, CHEW Chew 1 capsule by mouth daily.    Marland Kitchen FLUoxetine (PROZAC) 40 MG capsule Take 1 capsule (40 mg total) by mouth daily. 90 capsule 0  . furosemide (LASIX) 40 MG tablet Take 1 tablet (40 mg total) by mouth 2 (two) times daily. 180 tablet 3  . Magnesium 250 MG TABS Take 1 tablet by mouth daily.    . Melatonin 5 MG SUBL Place 5 mg under the tongue at bedtime as needed (sleep).     . meloxicam (MOBIC) 15 MG tablet Take 1 tablet (15 mg total) by mouth daily. 90 tablet 1  . Multiple Vitamin (MULTIVITAMIN WITH MINERALS) TABS tablet Take 1 tablet  by mouth daily.    Marland Kitchen nystatin (MYCOSTATIN/NYSTOP) 100000 UNIT/GM POWD Apply 1 g topically 2 (two) times daily as needed (rash).    Marland Kitchen omeprazole (PRILOSEC) 40 MG capsule Take 1 capsule (40 mg total) by mouth daily. 90 capsule 1  . polyethylene glycol (MIRALAX / GLYCOLAX) packet Take 17 g by mouth daily.    Marland Kitchen PROLENSA 0.07 % SOLN Place 1 drop into the right eye at bedtime.    . ranitidine (ZANTAC) 75 MG tablet Take by mouth.    . Sennosides (EX-LAX PO) Take 1 tablet by mouth daily as needed (constipation).    . vitamin B-12 (CYANOCOBALAMIN) 1000 MCG tablet Take 1,000 mcg by mouth daily.    Marland Kitchen ZINC OXIDE, TOPICAL, 10 % CREA Apply 1 application topically as needed (for rash).    . QUEtiapine (SEROQUEL) 200 MG tablet Take 1 tablet (200 mg total) by mouth at bedtime. 90 tablet 0   No current facility-administered medications on file prior to visit.      No Known Allergies  Objective: Gerald [name] is a pleasant 74 y.o. male WD, WN in NAD. AAO x 3.  There were no vitals filed for this visit.  Vascular Examination: Capillary refill time to digits immediate b/l. Faintly palpable pedal pulses b/l. Pedal hair sparse. Lower extremity skin temperature gradient within normal limits.  Dermatological Examination: Pedal skin with normal turgor, texture and tone bilaterally. No open wounds bilaterally. No interdigital macerations bilaterally. Toenails 1-5 b/l elongated, discolored, dystrophic, thickened, crumbly with subungual debris and tenderness to dorsal palpation. Hyperkeratotic lesion(s) submet head 5 right foot.  No erythema, no edema, no drainage, no flocculence.  Musculoskeletal: Normal muscle strength 5/5 to all lower extremity muscle groups bilaterally. No pain crepitus or joint limitation noted with ROM b/l. Hammertoes noted to the 2-5 bilaterally. Limb length discrepancy LLE.  Neurological Examination: Protective sensation intact 5/5 intact bilaterally with 10g monofilament b/l. Vibratory sensation intact b/l. Proprioception intact bilaterally.  Assessment: 1. Pain due to onychomycosis of toenails of both feet   2. Callus   3. 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 Acquired unequal limb length     Plan: -Examined patient. -No new findings. No new orders. -Continue diabetic foot care principles. -Toenails 1-5 b/l were debrided in length and girth with sterile nail nippers and dremel without iatrogenic bleeding.  -Callus(es) submet head 5 right foot pared utilizing sterile scalpel blade without complication or incident. Total number debrided =1. -Patient to report any pedal injuries to medical professional immediately. -Patient to continue soft, supportive shoe gear daily. -Patient/POA to call should there be question/concern in the  interim.  Return in about 3 months (around 04/03/2020) for Routine foot care.  Marzetta Board, DPM

## 2020-01-10 DIAGNOSIS — I129 Hypertensive chronic kidney disease with stage 1 through stage 4 chronic kidney disease, or unspecified chronic kidney disease: Secondary | ICD-10-CM | POA: Diagnosis not present

## 2020-01-10 DIAGNOSIS — N189 Chronic kidney disease, unspecified: Secondary | ICD-10-CM | POA: Diagnosis not present

## 2020-01-10 DIAGNOSIS — D631 Anemia in chronic kidney disease: Secondary | ICD-10-CM | POA: Diagnosis not present

## 2020-01-10 DIAGNOSIS — N179 Acute kidney failure, unspecified: Secondary | ICD-10-CM | POA: Diagnosis not present

## 2020-01-10 DIAGNOSIS — N183 Chronic kidney disease, stage 3 unspecified: Secondary | ICD-10-CM | POA: Diagnosis not present

## 2020-01-10 DIAGNOSIS — R809 Proteinuria, unspecified: Secondary | ICD-10-CM | POA: Diagnosis not present

## 2020-01-12 DIAGNOSIS — H40023 Open angle with borderline findings, high risk, bilateral: Secondary | ICD-10-CM | POA: Diagnosis not present

## 2020-01-26 ENCOUNTER — Encounter (INDEPENDENT_AMBULATORY_CARE_PROVIDER_SITE_OTHER): Payer: Medicare Other | Admitting: Ophthalmology

## 2020-01-26 ENCOUNTER — Other Ambulatory Visit: Payer: Self-pay

## 2020-01-26 DIAGNOSIS — E113211 Type 2 diabetes mellitus with mild nonproliferative diabetic retinopathy with macular edema, right eye: Secondary | ICD-10-CM

## 2020-01-26 DIAGNOSIS — I10 Essential (primary) hypertension: Secondary | ICD-10-CM | POA: Diagnosis not present

## 2020-01-26 DIAGNOSIS — H35033 Hypertensive retinopathy, bilateral: Secondary | ICD-10-CM | POA: Diagnosis not present

## 2020-01-26 DIAGNOSIS — H43813 Vitreous degeneration, bilateral: Secondary | ICD-10-CM | POA: Diagnosis not present

## 2020-01-26 DIAGNOSIS — E113392 Type 2 diabetes mellitus with moderate nonproliferative diabetic retinopathy without macular edema, left eye: Secondary | ICD-10-CM | POA: Diagnosis not present

## 2020-03-08 ENCOUNTER — Encounter (INDEPENDENT_AMBULATORY_CARE_PROVIDER_SITE_OTHER): Payer: Medicare Other | Admitting: Ophthalmology

## 2020-03-08 ENCOUNTER — Other Ambulatory Visit: Payer: Self-pay

## 2020-03-08 DIAGNOSIS — E113292 Type 2 diabetes mellitus with mild nonproliferative diabetic retinopathy without macular edema, left eye: Secondary | ICD-10-CM | POA: Diagnosis not present

## 2020-03-08 DIAGNOSIS — E113311 Type 2 diabetes mellitus with moderate nonproliferative diabetic retinopathy with macular edema, right eye: Secondary | ICD-10-CM

## 2020-03-08 DIAGNOSIS — H43813 Vitreous degeneration, bilateral: Secondary | ICD-10-CM | POA: Diagnosis not present

## 2020-03-08 DIAGNOSIS — I10 Essential (primary) hypertension: Secondary | ICD-10-CM

## 2020-03-08 DIAGNOSIS — H35033 Hypertensive retinopathy, bilateral: Secondary | ICD-10-CM

## 2020-03-15 DIAGNOSIS — R7989 Other specified abnormal findings of blood chemistry: Secondary | ICD-10-CM | POA: Diagnosis not present

## 2020-03-15 DIAGNOSIS — E119 Type 2 diabetes mellitus without complications: Secondary | ICD-10-CM | POA: Diagnosis not present

## 2020-03-15 DIAGNOSIS — E785 Hyperlipidemia, unspecified: Secondary | ICD-10-CM | POA: Diagnosis not present

## 2020-03-15 DIAGNOSIS — E1122 Type 2 diabetes mellitus with diabetic chronic kidney disease: Secondary | ICD-10-CM | POA: Diagnosis not present

## 2020-03-22 DIAGNOSIS — I5032 Chronic diastolic (congestive) heart failure: Secondary | ICD-10-CM | POA: Diagnosis not present

## 2020-03-22 DIAGNOSIS — E119 Type 2 diabetes mellitus without complications: Secondary | ICD-10-CM | POA: Diagnosis not present

## 2020-03-22 DIAGNOSIS — E785 Hyperlipidemia, unspecified: Secondary | ICD-10-CM | POA: Diagnosis not present

## 2020-03-22 DIAGNOSIS — I129 Hypertensive chronic kidney disease with stage 1 through stage 4 chronic kidney disease, or unspecified chronic kidney disease: Secondary | ICD-10-CM | POA: Diagnosis not present

## 2020-03-22 DIAGNOSIS — Z0000 Encounter for general adult medical examination without abnormal findings: Secondary | ICD-10-CM | POA: Diagnosis not present

## 2020-03-22 DIAGNOSIS — D509 Iron deficiency anemia, unspecified: Secondary | ICD-10-CM | POA: Diagnosis not present

## 2020-03-23 ENCOUNTER — Encounter: Payer: Self-pay | Admitting: Podiatry

## 2020-04-04 ENCOUNTER — Ambulatory Visit: Payer: Medicare Other | Admitting: Podiatry

## 2020-04-19 ENCOUNTER — Encounter (INDEPENDENT_AMBULATORY_CARE_PROVIDER_SITE_OTHER): Payer: Medicare Other | Admitting: Ophthalmology

## 2020-04-19 ENCOUNTER — Other Ambulatory Visit: Payer: Self-pay

## 2020-04-19 DIAGNOSIS — E113292 Type 2 diabetes mellitus with mild nonproliferative diabetic retinopathy without macular edema, left eye: Secondary | ICD-10-CM

## 2020-04-19 DIAGNOSIS — H35033 Hypertensive retinopathy, bilateral: Secondary | ICD-10-CM | POA: Diagnosis not present

## 2020-04-19 DIAGNOSIS — E113211 Type 2 diabetes mellitus with mild nonproliferative diabetic retinopathy with macular edema, right eye: Secondary | ICD-10-CM | POA: Diagnosis not present

## 2020-04-19 DIAGNOSIS — H43813 Vitreous degeneration, bilateral: Secondary | ICD-10-CM | POA: Diagnosis not present

## 2020-04-19 DIAGNOSIS — I10 Essential (primary) hypertension: Secondary | ICD-10-CM | POA: Diagnosis not present

## 2020-05-21 DIAGNOSIS — N189 Chronic kidney disease, unspecified: Secondary | ICD-10-CM | POA: Diagnosis not present

## 2020-05-21 DIAGNOSIS — I129 Hypertensive chronic kidney disease with stage 1 through stage 4 chronic kidney disease, or unspecified chronic kidney disease: Secondary | ICD-10-CM | POA: Diagnosis not present

## 2020-05-21 DIAGNOSIS — R809 Proteinuria, unspecified: Secondary | ICD-10-CM | POA: Diagnosis not present

## 2020-05-21 DIAGNOSIS — N183 Chronic kidney disease, stage 3 unspecified: Secondary | ICD-10-CM | POA: Diagnosis not present

## 2020-05-21 DIAGNOSIS — D631 Anemia in chronic kidney disease: Secondary | ICD-10-CM | POA: Diagnosis not present

## 2020-05-21 DIAGNOSIS — N179 Acute kidney failure, unspecified: Secondary | ICD-10-CM | POA: Diagnosis not present

## 2020-05-24 ENCOUNTER — Encounter (INDEPENDENT_AMBULATORY_CARE_PROVIDER_SITE_OTHER): Payer: Medicare Other | Admitting: Ophthalmology

## 2020-05-31 DIAGNOSIS — N183 Chronic kidney disease, stage 3 unspecified: Secondary | ICD-10-CM | POA: Diagnosis not present

## 2020-06-25 ENCOUNTER — Encounter (INDEPENDENT_AMBULATORY_CARE_PROVIDER_SITE_OTHER): Payer: Medicare Other | Admitting: Ophthalmology

## 2020-06-25 ENCOUNTER — Other Ambulatory Visit: Payer: Self-pay

## 2020-06-25 DIAGNOSIS — H35033 Hypertensive retinopathy, bilateral: Secondary | ICD-10-CM

## 2020-06-25 DIAGNOSIS — E113311 Type 2 diabetes mellitus with moderate nonproliferative diabetic retinopathy with macular edema, right eye: Secondary | ICD-10-CM | POA: Diagnosis not present

## 2020-06-25 DIAGNOSIS — E113292 Type 2 diabetes mellitus with mild nonproliferative diabetic retinopathy without macular edema, left eye: Secondary | ICD-10-CM | POA: Diagnosis not present

## 2020-06-25 DIAGNOSIS — H43813 Vitreous degeneration, bilateral: Secondary | ICD-10-CM | POA: Diagnosis not present

## 2020-06-25 DIAGNOSIS — I10 Essential (primary) hypertension: Secondary | ICD-10-CM

## 2020-07-11 ENCOUNTER — Ambulatory Visit: Payer: Medicare Other | Admitting: Podiatry

## 2020-07-26 ENCOUNTER — Encounter (INDEPENDENT_AMBULATORY_CARE_PROVIDER_SITE_OTHER): Payer: Medicare Other | Admitting: Ophthalmology

## 2020-08-02 DIAGNOSIS — E113311 Type 2 diabetes mellitus with moderate nonproliferative diabetic retinopathy with macular edema, right eye: Secondary | ICD-10-CM | POA: Diagnosis not present

## 2020-08-02 DIAGNOSIS — E113292 Type 2 diabetes mellitus with mild nonproliferative diabetic retinopathy without macular edema, left eye: Secondary | ICD-10-CM | POA: Diagnosis not present

## 2020-08-02 DIAGNOSIS — H40023 Open angle with borderline findings, high risk, bilateral: Secondary | ICD-10-CM | POA: Diagnosis not present

## 2020-08-07 ENCOUNTER — Ambulatory Visit: Payer: Medicare Other | Admitting: Podiatry

## 2020-08-07 ENCOUNTER — Encounter: Payer: Self-pay | Admitting: Podiatry

## 2020-08-07 ENCOUNTER — Other Ambulatory Visit: Payer: Self-pay

## 2020-08-07 DIAGNOSIS — M79675 Pain in left toe(s): Secondary | ICD-10-CM

## 2020-08-07 DIAGNOSIS — B351 Tinea unguium: Secondary | ICD-10-CM | POA: Diagnosis not present

## 2020-08-07 DIAGNOSIS — L84 Corns and callosities: Secondary | ICD-10-CM

## 2020-08-07 DIAGNOSIS — E1122 Type 2 diabetes mellitus with diabetic chronic kidney disease: Secondary | ICD-10-CM

## 2020-08-07 DIAGNOSIS — N183 Chronic kidney disease, stage 3 unspecified: Secondary | ICD-10-CM | POA: Diagnosis not present

## 2020-08-07 DIAGNOSIS — M79674 Pain in right toe(s): Secondary | ICD-10-CM | POA: Diagnosis not present

## 2020-08-12 NOTE — Progress Notes (Signed)
Subjective: Gerald [name] is a pleasant 75 y.o. male patient seen today painful thick toenails that are difficult to trim. Pain interferes with ambulation. Aggravating factors include wearing enclosed shoe gear. Pain is relieved with periodic professional debridement.  [name] is diabetic. He voices no new pedal concerns on today's visit.  PCP is [name], DO. Last visit was: 03/22/2020.  No Known Allergies  Objective: Physical Exam  General: Gerald [name] is a pleasant 75 y.o. African American male, WD, WN in NAD. AAO x 3.   Vascular:  Capillary refill time to digits immediate b/l. Faintly palpable DP pulse(s) b/l lower extremities. Faintly palpable PT pulse(s) b/l lower extremities. Pedal hair sparse. Lower extremity skin temperature gradient within normal limits. No pain with calf compression b/l.  Dermatological:  Pedal skin with normal turgor, texture and tone b/l lower extremities. No open wounds b/l lower extremities. No interdigital macerations b/l lower extremities. Toenails 1-5 b/l elongated, discolored, dystrophic, thickened, crumbly with subungual debris and tenderness to dorsal palpation. Hyperkeratotic lesion(s) submet head 5 right foot.  No erythema, no edema, no drainage, no fluctuance.  Musculoskeletal:  Normal muscle strength 5/5 to all lower extremity muscle groups bilaterally. Limb length discrepancy of left lower extremity. Hammertoe(s) noted to the 2-5 bilaterally.  Neurological:  Protective sensation intact 5/5 intact bilaterally with 10g monofilament b/l. Vibratory sensation intact b/l.  Assessment and Plan:  1. Pain due to onychomycosis of toenails of both feet   2. Callus   3. Type 2 diabetes mellitus with stage 3 chronic kidney disease, without long-term current use of insulin, unspecified whether stage 3a or 3b CKD ([place])   -Examined patient. -Continue diabetic foot care principles. -Patient to continue soft, supportive shoe gear  daily. -Toenails 1-5 b/l were debrided in length and girth with sterile nail nippers and dremel without iatrogenic bleeding.  -Callus(es) submet head 5 right foot pared utilizing sterile scalpel blade without complication or incident. Total number debrided =1. -Patient to report any pedal injuries to medical professional immediately. -Patient/POA to call should there be question/concern in the interim.  Return in about 3 months (around 11/07/2020).  Marzetta Board, DPM

## 2020-08-14 ENCOUNTER — Other Ambulatory Visit: Payer: Self-pay

## 2020-08-14 ENCOUNTER — Encounter (INDEPENDENT_AMBULATORY_CARE_PROVIDER_SITE_OTHER): Payer: Medicare Other | Admitting: Ophthalmology

## 2020-08-14 DIAGNOSIS — I10 Essential (primary) hypertension: Secondary | ICD-10-CM

## 2020-08-14 DIAGNOSIS — E113311 Type 2 diabetes mellitus with moderate nonproliferative diabetic retinopathy with macular edema, right eye: Secondary | ICD-10-CM | POA: Diagnosis not present

## 2020-08-14 DIAGNOSIS — E113392 Type 2 diabetes mellitus with moderate nonproliferative diabetic retinopathy without macular edema, left eye: Secondary | ICD-10-CM | POA: Diagnosis not present

## 2020-08-14 DIAGNOSIS — H43813 Vitreous degeneration, bilateral: Secondary | ICD-10-CM | POA: Diagnosis not present

## 2020-08-14 DIAGNOSIS — H35033 Hypertensive retinopathy, bilateral: Secondary | ICD-10-CM | POA: Diagnosis not present

## 2020-09-11 ENCOUNTER — Other Ambulatory Visit: Payer: Self-pay

## 2020-09-11 ENCOUNTER — Encounter (INDEPENDENT_AMBULATORY_CARE_PROVIDER_SITE_OTHER): Payer: Medicare Other | Admitting: Ophthalmology

## 2020-09-11 DIAGNOSIS — H43813 Vitreous degeneration, bilateral: Secondary | ICD-10-CM

## 2020-09-11 DIAGNOSIS — I10 Essential (primary) hypertension: Secondary | ICD-10-CM

## 2020-09-11 DIAGNOSIS — E113311 Type 2 diabetes mellitus with moderate nonproliferative diabetic retinopathy with macular edema, right eye: Secondary | ICD-10-CM | POA: Diagnosis not present

## 2020-09-11 DIAGNOSIS — E113292 Type 2 diabetes mellitus with mild nonproliferative diabetic retinopathy without macular edema, left eye: Secondary | ICD-10-CM | POA: Diagnosis not present

## 2020-09-11 DIAGNOSIS — H35033 Hypertensive retinopathy, bilateral: Secondary | ICD-10-CM | POA: Diagnosis not present

## 2020-09-14 ENCOUNTER — Encounter (INDEPENDENT_AMBULATORY_CARE_PROVIDER_SITE_OTHER): Payer: Self-pay

## 2020-09-20 DIAGNOSIS — E538 Deficiency of other specified B group vitamins: Secondary | ICD-10-CM | POA: Diagnosis not present

## 2020-09-20 DIAGNOSIS — D509 Iron deficiency anemia, unspecified: Secondary | ICD-10-CM | POA: Diagnosis not present

## 2020-09-20 DIAGNOSIS — I129 Hypertensive chronic kidney disease with stage 1 through stage 4 chronic kidney disease, or unspecified chronic kidney disease: Secondary | ICD-10-CM | POA: Diagnosis not present

## 2020-09-20 DIAGNOSIS — E119 Type 2 diabetes mellitus without complications: Secondary | ICD-10-CM | POA: Diagnosis not present

## 2020-09-20 DIAGNOSIS — E559 Vitamin D deficiency, unspecified: Secondary | ICD-10-CM | POA: Diagnosis not present

## 2020-09-27 DIAGNOSIS — R0781 Pleurodynia: Secondary | ICD-10-CM | POA: Diagnosis not present

## 2020-09-27 DIAGNOSIS — E11319 Type 2 diabetes mellitus with unspecified diabetic retinopathy without macular edema: Secondary | ICD-10-CM | POA: Diagnosis not present

## 2020-09-27 DIAGNOSIS — I5032 Chronic diastolic (congestive) heart failure: Secondary | ICD-10-CM | POA: Diagnosis not present

## 2020-09-27 DIAGNOSIS — E785 Hyperlipidemia, unspecified: Secondary | ICD-10-CM | POA: Diagnosis not present

## 2020-09-27 DIAGNOSIS — D638 Anemia in other chronic diseases classified elsewhere: Secondary | ICD-10-CM | POA: Diagnosis not present

## 2020-09-27 DIAGNOSIS — I129 Hypertensive chronic kidney disease with stage 1 through stage 4 chronic kidney disease, or unspecified chronic kidney disease: Secondary | ICD-10-CM | POA: Diagnosis not present

## 2020-09-27 DIAGNOSIS — Z23 Encounter for immunization: Secondary | ICD-10-CM | POA: Diagnosis not present

## 2020-09-27 DIAGNOSIS — M25551 Pain in right hip: Secondary | ICD-10-CM | POA: Diagnosis not present

## 2020-09-27 DIAGNOSIS — R296 Repeated falls: Secondary | ICD-10-CM | POA: Diagnosis not present

## 2020-09-27 DIAGNOSIS — E559 Vitamin D deficiency, unspecified: Secondary | ICD-10-CM | POA: Diagnosis not present

## 2020-09-27 DIAGNOSIS — N1831 Chronic kidney disease, stage 3a: Secondary | ICD-10-CM | POA: Diagnosis not present

## 2020-09-27 DIAGNOSIS — R413 Other amnesia: Secondary | ICD-10-CM | POA: Diagnosis not present

## 2020-10-01 ENCOUNTER — Encounter: Payer: Self-pay | Admitting: Neurology

## 2020-10-01 ENCOUNTER — Ambulatory Visit: Payer: Medicare Other | Admitting: Neurology

## 2020-10-01 ENCOUNTER — Other Ambulatory Visit: Payer: Self-pay | Admitting: Neurology

## 2020-10-01 VITALS — BP 133/66 | HR 74

## 2020-10-01 DIAGNOSIS — G4720 Circadian rhythm sleep disorder, unspecified type: Secondary | ICD-10-CM | POA: Diagnosis not present

## 2020-10-01 DIAGNOSIS — R269 Unspecified abnormalities of gait and mobility: Secondary | ICD-10-CM

## 2020-10-01 DIAGNOSIS — G3184 Mild cognitive impairment, so stated: Secondary | ICD-10-CM

## 2020-10-01 DIAGNOSIS — I10 Essential (primary) hypertension: Secondary | ICD-10-CM | POA: Diagnosis not present

## 2020-10-01 MED ORDER — RIVASTIGMINE TARTRATE 1.5 MG PO CAPS
1.5000 mg | ORAL_CAPSULE | Freq: Two times a day (BID) | ORAL | 0 refills | Status: DC
Start: 2020-10-01 — End: 2020-10-02

## 2020-10-01 NOTE — Patient Instructions (Addendum)
Will check Vitamin B12 level and TSH  MRI Brain without contrast  Strongly encourage use of walker  Physical therapy for gait training and multiple falls  Start Rivastigmine 1.5 mg twice a day  Return in 6 months of sooner if worse
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
## 2020-10-02 ENCOUNTER — Telehealth: Payer: Self-pay | Admitting: Neurology

## 2020-10-02 LAB — TSH: TSH: 1.57 u[IU]/mL (ref 0.450–4.500)

## 2020-10-02 LAB — VITAMIN B12: Vitamin B-12: 464 pg/mL (ref 232–1245)

## 2020-10-02 NOTE — Telephone Encounter (Signed)
[employer] medicare order sent to [facility], [facility] they will reach out to the patient to schedule.  

## 2020-10-05 DIAGNOSIS — I5032 Chronic diastolic (congestive) heart failure: Secondary | ICD-10-CM | POA: Diagnosis not present

## 2020-10-05 DIAGNOSIS — I130 Hypertensive heart and chronic kidney disease with heart failure and stage 1 through stage 4 chronic kidney disease, or unspecified chronic kidney disease: Secondary | ICD-10-CM | POA: Diagnosis not present

## 2020-10-05 DIAGNOSIS — N1831 Chronic kidney disease, stage 3a: Secondary | ICD-10-CM | POA: Diagnosis not present

## 2020-10-05 DIAGNOSIS — E785 Hyperlipidemia, unspecified: Secondary | ICD-10-CM | POA: Diagnosis not present

## 2020-10-06 ENCOUNTER — Other Ambulatory Visit: Payer: Self-pay

## 2020-10-06 ENCOUNTER — Ambulatory Visit
Admission: RE | Admit: 2020-10-06 | Discharge: 2020-10-06 | Disposition: A | Discharge status: Home / Self Care | Payer: Medicare Other | Source: Ambulatory Visit | Attending: Neurology | Admitting: Neurology

## 2020-10-06 DIAGNOSIS — G3184 Mild cognitive impairment, so stated: Secondary | ICD-10-CM | POA: Diagnosis not present

## 2020-10-06 DIAGNOSIS — R519 Headache, unspecified: Secondary | ICD-10-CM | POA: Diagnosis not present

## 2020-10-09 ENCOUNTER — Encounter (INDEPENDENT_AMBULATORY_CARE_PROVIDER_SITE_OTHER): Payer: Medicare Other | Admitting: Ophthalmology

## 2020-10-10 ENCOUNTER — Ambulatory Visit: Payer: Medicare Other | Admitting: Physical Therapy

## 2020-10-15 ENCOUNTER — Ambulatory Visit: Payer: Medicare Other | Attending: Neurology | Admitting: Physical Therapy

## 2020-10-15 ENCOUNTER — Other Ambulatory Visit: Payer: Self-pay

## 2020-10-15 ENCOUNTER — Encounter: Payer: Self-pay | Admitting: Physical Therapy

## 2020-10-15 DIAGNOSIS — M6281 Muscle weakness (generalized): Secondary | ICD-10-CM | POA: Insufficient documentation

## 2020-10-15 DIAGNOSIS — R2689 Other abnormalities of gait and mobility: Secondary | ICD-10-CM | POA: Diagnosis not present

## 2020-10-15 DIAGNOSIS — R2681 Unsteadiness on feet: Secondary | ICD-10-CM | POA: Insufficient documentation

## 2020-10-15 DIAGNOSIS — R262 Difficulty in walking, not elsewhere classified: Secondary | ICD-10-CM | POA: Insufficient documentation

## 2020-10-15 NOTE — Therapy (Signed)
[facility]. [place], [place], [address] Phone: [phone]   Fax:  [phone]  Physical Therapy Evaluation  Patient Details  Name: Gerald [name] MRN: [id number] Date of Birth: 1945/10/19 No data recorded  Encounter Date: 10/15/2020   PT End of Session - 10/15/20 1639     Visit Number 1    Number of Visits 17    Date for PT Re-Evaluation 12/10/20    PT Start Time 7793    PT Stop Time 1638    PT Time Calculation (min) 51 min    Activity Tolerance Patient tolerated treatment well    Behavior During Therapy [facility] for tasks assessed/performed             Past Medical History:  Diagnosis Date   Abnormality of gait 10/05/2014   Anxiety    Bilateral edema of lower extremity    LE Dopplers 4/22 - no thrombus or thrombophelbitis, no reflux   Colon polyp    DDD (degenerative disc disease)    Depression    Diabetes mellitus without complication ([place])    controlled by diet   DJD (degenerative joint disease)    DOE (dyspnea on exertion)    Echo 04 /22/2014 - normal EF, mild LVH, grade 1 Diastolic Dysfunction; Lexiscan Myoview 04/28/12 - no ishcemia or infarction, LBBB septal wall motion; EF ~60%   Fatigue    GERD (gastroesophageal reflux disease)    H/O iron deficiency anemia    with  hemmorrhoids   Hyperlipidemia    Hypertension    Left bundle branch block     chronic, negative Myoview a normal echo as noted above    Osteoarthritis    with bilateral hip surgeries and multiple complication, with the most recent  surgery in January 2012   Scrotal edema    Vitamin D deficiency     Past Surgical History:  Procedure Laterality Date   Cardiology Nuclear Med Study   04 24 2014   overall impression ; NORMAL  STRESS  NUCLEAR STUDY   HIP SURGERY  2004   Venous Duplex  04 22 2014   LOWER EXTREMITY SWELLING -Impressions [name] ; This is a normal bilateral  lower extremity venous duplex Doppler evaluation     There were no vitals filed for this visit.    Subjective Assessment - 10/15/20 1551     Subjective 75 YO reports feeling unsteady with multiple falls. He presents with decreased memory. He also feels weak, which he feels is contributing to his falls.    Pertinent History DJD, Depression, DDD    Patient Stated Goals Patient would like to get back to the gym, have more confidence, not need a RW.    Currently in Pain? No/denies                [facility] PT Assessment - 10/15/20 0001       Balance Screen   Has the patient fallen in the past 6 months Yes    How many times? multiple    Has the patient had a decrease in activity level because of a fear of falling?  Yes    Is the patient reluctant to leave their home because of a fear of falling?  Yes      [place] residence    Living Arrangements Spouse/significant other;Children    Available Help at Discharge Family    Type of [place]  Home Access Stairs to enter    Entrance Stairs-Number of Steps 4    Entrance Stairs-Rails Left    Home Layout One level    [place] - 2 wheels;Cane - single point      Prior Function   Level of Independence Independent with basic ADLs;Independent with household mobility with device      Cognition   Memory Impaired      ROM / Strength   AROM / PROM / Strength AROM;Strength      AROM   Overall AROM Comments BLE ROM WFL except L hip limited in all planes.    AROM Assessment Site Hip;Knee;Ankle    Right/Left Hip Right;Left    Right Hip Extension 4    Right Hip ABduction 4    Left Hip Extension -3    Left Hip Flexion -3    Left Hip ABduction 2    Right/Left Knee Right;Left    Right Knee Extension 4    Right Knee Flexion 4    Left Knee Extension 4    Left Knee Flexion 4    Right/Left Ankle Right;Left    Right Ankle Dorsiflexion 4    Right Ankle Plantar Flexion 4    Right Ankle Inversion 4    Right Ankle Eversion 4    Left Ankle  Dorsiflexion 4    Left Ankle Plantar Flexion 4    Left Ankle Inversion 4    Left Ankle Eversion 4      Strength   Strength Assessment Site Hip;Knee;Ankle    Right/Left Hip Right;Left    Right Hip Flexion 4-/5    Right Hip ABduction 4/5    Left Hip Flexion 3-/5    Left Hip ABduction 2+/5    Right/Left Knee Right;Left    Right Knee Flexion 4/5    Right Knee Extension 4/5    Left Knee Flexion 4-/5    Left Knee Extension 4-/5    Right/Left Ankle Right;Left    Right Ankle Dorsiflexion 4/5    Left Ankle Dorsiflexion 4/5    Left Ankle Plantar Flexion 4/5      Transfers   Transfers Sit to Stand    Sit to Stand 6: Modified independent (Device/Increase time)    Comments Shifts to R for sit to stand.      Ambulation/Gait   Ambulation/Gait Yes    Ambulation/Gait Assistance 6: Modified independent (Device/Increase time)    Ambulation Distance (Feet) 100 Feet    Assistive device Straight cane    Gait Pattern Step-to pattern;Decreased stance time - left;Decreased step length - right;Decreased stride length;Decreased hip/knee flexion - left;Decreased weight shift to left                        Objective measurements completed on examination: See above findings.                PT Education - 10/15/20 1639     Education Details HEP, POC    Person(s) Educated Spouse;Patient    Methods Explanation;Demonstration;Handout    Comprehension Verbalized understanding;Returned demonstration              PT Short Term Goals - 10/15/20 1646       PT SHORT TERM GOAL #1   Title Patient will perform basic HEP with MI, using written instructions.    Baseline given initial HEP    Time 2    Period Weeks    Status New  Target Date 10/29/20               PT Long Term Goals - 10/15/20 1647       PT LONG TERM GOAL #1   Title Improve L hip strenght to 3/5    Baseline 2+-(3-)/5    Time 8    Period Weeks    Status New    Target Date 12/10/20      PT  LONG TERM GOAL #2   Title Patient will perform advanced HEP MI with written instructions    Baseline Initiated    Time 8    Period Weeks    Status New    Target Date 12/10/20      PT LONG TERM GOAL #3   Title ambualte x at least 500' with LRAD, MI, including uneven surfaces.    Baseline Unsteady with cane, 50', CGA.    Time 8    Period Weeks    Status New    Target Date 12/10/20      PT LONG TERM GOAL #4   Title Up and down 4 steps using U rail, with step to gait pattern, S    Baseline CGA, unsteady    Time 8    Period Weeks    Status New    Target Date 12/10/20      PT LONG TERM GOAL #5   Title Patient will demosntrate improved weight hsift onto LLE in gait iwth longer step length and equal stance time through each LE.    Baseline Step to gait with decreased WB through LLE.    Time 8    Period Weeks    Status New    Target Date 12/10/20                    Plan - 10/15/20 1640     Clinical Impression Statement 75 YO arrived to therapy using straight cane with unsteady gait. He demonstrates weakness, especially in L hip, with a L leg length discrepancy. Patient has lift in shoe, reports that in 2012 he had a hip infection and they "removed the connection" in his hip ([place]?)    Personal Factors and Comorbidities Age;Comorbidity 3+    Comorbidities leg length discrepancy, arthritis, DDD, L BBB    Examination-Activity Limitations Bathing;Locomotion Level;Transfers;Bend;Sleep;Squat;Stairs;Stand;Lift    Stability/Clinical Decision Making Stable/Uncomplicated    Clinical Decision Making Moderate    Rehab Potential Good    PT Frequency 2x / week    PT Duration 8 weeks    PT Treatment/Interventions ADLs/Self Care Home Management;Electrical Stimulation;DME Instruction;Moist Heat;Iontophoresis 4mg /ml Dexamethasone;Gait training;Stair training;Functional mobility training;Therapeutic activities;Therapeutic exercise;Balance training;Neuromuscular re-education;Manual  techniques;Patient/family education    PT Next Visit Plan Trunk and LE strengthening, balance training    PT Home Exercise Plan Initiated    Consulted and Agree with Plan of Care Patient;Family member/caregiver             Patient will benefit from skilled therapeutic intervention in order to improve the following deficits and impairments:  Abnormal gait, Decreased coordination, Difficulty walking, Cardiopulmonary status limiting activity, Decreased endurance, Decreased activity tolerance, Decreased balance, Decreased mobility, Decreased strength, Impaired flexibility, Improper body mechanics  Visit Diagnosis: Muscle weakness (generalized)  Other abnormalities of gait and mobility  Unsteadiness on feet  Difficulty in walking, not elsewhere classified     Problem List Patient Active Problem List   Diagnosis Date Noted   Anxiety state 01/04/2020   Benign prostatic hyperplasia without lower urinary tract symptoms  01/04/2020   Chronic diastolic heart failure ([place]) 01/04/2020   Diabetic renal disease ([place]) 01/04/2020   Diabetic retinopathy ([place]) 01/04/2020   ED (erectile dysfunction) of organic origin 01/04/2020   Hypertrophic cardiomyopathy ([place]) 01/04/2020   Insomnia 01/04/2020   Left bundle branch block 01/04/2020   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   Primary localized osteoarthritis of pelvic region and thigh 01/04/2020   Stage 3 chronic kidney disease ([place]) 01/04/2020   Vitamin B12 deficiency (non anemic) 01/04/2020   Recurrent major depression ([place]) 01/04/2020   Type 2 diabetes mellitus without complications ([place]) 34/28/7681   Severe episode of recurrent major depressive disorder, without psychotic features ([place]) 10/05/2018   Cognitive complaints with normal neuropsychological exam 03/11/2018   Referred otalgia of left ear 09/23/2016   Temporomandibular joint (TMJ) pain 09/23/2016   Cervicogenic headache 09/03/2016   Obstructive sleep apnea syndrome 11/15/2015    Memory difficulties 10/05/2014   Abnormality of gait 10/05/2014   Intermittent explosive disorder 09/25/2014   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 [place], [place], [address] Phone: [phone]   Fax:  [phone]  Name: Gerald [name] MRN: [id number] Date of Birth: 24-Jan-1945

## 2020-10-15 NOTE — Patient Instructions (Signed)
Access Code: [email or web address] URL: [email or web address] Date: 10/15/2020 Prepared by: Ethel Rana  Exercises Supine Bridge - 1 x daily - 7 x weekly - 1 sets - 10 reps Supine Straight Leg Raise - 1 x daily - 7 x weekly - 1 sets - 10 reps Seated Hip Abduction - 1 x daily - 7 x weekly - 1 sets - 10 reps Front to Back Standing Balance on Rocker Board at [employer] - 1 x daily - 7 x weekly - 1 sets - 10 reps Add Lateral weight shifts in stand.

## 2020-10-16 ENCOUNTER — Encounter (INDEPENDENT_AMBULATORY_CARE_PROVIDER_SITE_OTHER): Payer: Self-pay | Admitting: Ophthalmology

## 2020-10-16 ENCOUNTER — Encounter (INDEPENDENT_AMBULATORY_CARE_PROVIDER_SITE_OTHER): Payer: Medicare Other | Admitting: Ophthalmology

## 2020-10-16 ENCOUNTER — Ambulatory Visit (INDEPENDENT_AMBULATORY_CARE_PROVIDER_SITE_OTHER): Payer: Medicare Other | Admitting: Ophthalmology

## 2020-10-16 DIAGNOSIS — Z961 Presence of intraocular lens: Secondary | ICD-10-CM | POA: Insufficient documentation

## 2020-10-16 DIAGNOSIS — H35371 Puckering of macula, right eye: Secondary | ICD-10-CM | POA: Diagnosis not present

## 2020-10-16 DIAGNOSIS — E103292 Type 1 diabetes mellitus with mild nonproliferative diabetic retinopathy without macular edema, left eye: Secondary | ICD-10-CM

## 2020-10-16 DIAGNOSIS — E113292 Type 2 diabetes mellitus with mild nonproliferative diabetic retinopathy without macular edema, left eye: Secondary | ICD-10-CM | POA: Insufficient documentation

## 2020-10-16 DIAGNOSIS — E113211 Type 2 diabetes mellitus with mild nonproliferative diabetic retinopathy with macular edema, right eye: Secondary | ICD-10-CM | POA: Diagnosis not present

## 2020-10-16 MED ORDER — BEVACIZUMAB 2.5 MG/0.1ML IZ SOSY
2.5000 mg | PREFILLED_SYRINGE | INTRAVITREAL | Status: AC | PRN
  Administered 2020-10-16: 2.5 mg via INTRAVITREAL

## 2020-10-16 NOTE — Assessment & Plan Note (Signed)
OD will treat this right eye with intravitreal Avastin to see if the CME centrally is responsive.  This rather diffuse CME and also that noted on fluorescein angiography of Dr. 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  The angiograms are limited in scope but I do not see extensive diabetic retinopathy otherwise.  For this reason we will treat today with intravitreal Avastin to maintain ongoing therapy which may have suppressed progression of NPDR by ongoing antivegF, and allow for some resolution.  Nonetheless we will recheck in 2 weeks via OCT for efficacy of this antivegF therapy

## 2020-10-16 NOTE — Progress Notes (Signed)
10/16/2020     CHIEF COMPLAINT Patient presents for  Chief Complaint  Patient presents with   Retina Evaluation      HISTORY OF PRESENT ILLNESS: Gerald [name] is a 75 y.o. male who presents to the clinic today for:   HPI     Retina Evaluation   In right eye.  This started 3 years ago.  Duration of 3 years.        Comments   NP- pt wants second opinion-  Dr. Zigmund Daniel  Pt states, "I have been getting shots in my right eye from Dr. Zigmund Daniel for the last three years and he dismissed me as a pt at my last visit. I feel that we had some personal issues and it was no longer a good fit." Pt reports using Dorzolamide drops BID OD Last A1C: 5.6 LBS: does not check every day         Last edited by Kendra Opitz, COA on 10/16/2020  2:55 PM.      Referring physician: Monna Fam, MD [place],  [place] [id number]  HISTORICAL INFORMATION:   Selected notes from the MEDICAL RECORD NUMBER    Lab Results  Component Value Date   HGBA1C 5.6 06/24/2013     CURRENT MEDICATIONS: Current Outpatient Medications (Ophthalmic Drugs)  Medication Sig   Bromfenac Sodium (PROLENSA) 0.07 % SOLN 1 drop into affected eye   dorzolamide (TRUSOPT) 2 % ophthalmic solution Place 1 drop into the right eye 2 (two) times daily.   PROLENSA 0.07 % SOLN Place 1 drop into the right eye at bedtime.   No current facility-administered medications for this visit. (Ophthalmic Drugs)   Current Outpatient Medications (Other)  Medication Sig   ALPRAZolam (XANAX) 0.25 MG tablet    amLODipine (NORVASC) 10 MG tablet    ascorbic acid (VITAMIN C) 500 MG tablet 1 tablet   aspirin 81 MG tablet Take 81 mg by mouth daily.   atorvastatin (LIPITOR) 10 MG tablet    bisoprolol-hydrochlorothiazide (ZIAC) 2.5-6.25 MG tablet    buPROPion (WELLBUTRIN XL) 150 MG 24 hr tablet TAKE 1 TABLET (150 MG TOTAL) BY MOUTH EVERY MORNING.   buPROPion (WELLBUTRIN XL) 300 MG 24 hr tablet     Cholecalciferol (VITAMIN D3) 5000 UNITS CAPS Take 5,000 Units by mouth daily.    doxazosin (CARDURA) 8 MG tablet Take 1 tablet (8 mg total) by mouth daily.   enalapril (VASOTEC) 20 MG tablet Take 20 mg by mouth 2 (two) times daily.   Ferrous Sulfate (IRON) 325 (65 FE) MG TABS Take 65 mg by mouth daily.    Fiber, Guar Gum, CHEW Chew 1 capsule by mouth daily.   FLUoxetine (PROZAC) 40 MG capsule Take 1 capsule (40 mg total) by mouth daily.   furosemide (LASIX) 20 MG tablet Take 20 mg by mouth daily.   Magnesium 250 MG TABS Take 1 tablet by mouth daily.   Melatonin 5 MG SUBL Place 5 mg under the tongue at bedtime as needed (sleep).    Multiple Vitamin (MULTIVITAMIN WITH MINERALS) TABS tablet Take 1 tablet by mouth daily.   nystatin (MYCOSTATIN/NYSTOP) 100000 UNIT/GM POWD Apply 1 g topically 2 (two) times daily as needed (rash).   omeprazole (PRILOSEC) 40 MG capsule Take 1 capsule (40 mg total) by mouth daily.   polyethylene glycol (MIRALAX / GLYCOLAX) packet Take 17 g by mouth daily.   rivastigmine (EXELON) 1.5 MG capsule TAKE 1 CAPSULE(1.5 MG) BY MOUTH TWICE DAILY  Sennosides (EX-LAX PO) Take 1 tablet by mouth daily as needed (constipation).   vitamin B-12 (CYANOCOBALAMIN) 1000 MCG tablet Take 1,000 mcg by mouth daily.   vitamin C (ASCORBIC ACID) 500 MG tablet 1 tablet   ZINC OXIDE, TOPICAL, 10 % CREA Apply 1 application topically as needed (for rash).   No current facility-administered medications for this visit. (Other)      REVIEW OF SYSTEMS: ROS   Negative for: Constitutional, Gastrointestinal, Neurological, Skin, Genitourinary, Musculoskeletal, HENT, Endocrine, Cardiovascular, Eyes, Respiratory, Psychiatric, Allergic/Imm, Heme/Lymph Last edited by Hurman Horn, MD on 10/16/2020  3:42 PM.       ALLERGIES No Known Allergies  PAST MEDICAL HISTORY Past Medical History:  Diagnosis Date   Abnormality of gait 10/05/2014   Anxiety    Bilateral edema of lower extremity    LE  Dopplers 4/22 - no thrombus or thrombophelbitis, no reflux   Colon polyp    DDD (degenerative disc disease)    Depression    Diabetes mellitus without complication ([place])    controlled by diet   DJD (degenerative joint disease)    DOE (dyspnea on exertion)    Echo 04 /22/2014 - normal EF, mild LVH, grade 1 Diastolic Dysfunction; Lexiscan Myoview 04/28/12 - no ishcemia or infarction, LBBB septal wall motion; EF ~60%   Fatigue    GERD (gastroesophageal reflux disease)    H/O iron deficiency anemia    with  hemmorrhoids   Hyperlipidemia    Hypertension    Left bundle branch block     chronic, negative Myoview a normal echo as noted above    Osteoarthritis    with bilateral hip surgeries and multiple complication, with the most recent  surgery in January 2012   Scrotal edema    Type 2 diabetes mellitus without complications ([place]) 01/74/9449   Vitamin D deficiency    Past Surgical History:  Procedure Laterality Date   Cardiology Nuclear Med Study   04 24 2014   overall impression ; NORMAL  STRESS  NUCLEAR STUDY   HIP SURGERY  2004   Venous Duplex  04 22 2014   LOWER EXTREMITY SWELLING -Impressions [name] ; This is a normal bilateral  lower extremity venous duplex Doppler evaluation    FAMILY HISTORY Family History  Problem Relation Age of Onset   Dementia Mother    Hypertension Mother    Heart disease Mother    Diabetes Mother    Heart attack Maternal Grandmother    Diabetes Maternal Grandfather     SOCIAL HISTORY Social History   Tobacco Use   Smoking status: Former    Packs/day: 0.50    Years: 30.00    Pack years: 15.00    Types: Cigarettes    Quit date: 03/30/1983    Years since quitting: 37.5   Smokeless tobacco: Never  Substance Use Topics   Alcohol use: Yes    Alcohol/week: 1.0 standard drink    Types: 1 Standard drinks or equivalent per week    Comment: couple times a year   Drug use: No         OPHTHALMIC EXAM:  Base Eye Exam     Visual Acuity  (ETDRS)       Right Left   Dist [place] 20/200 +1 20/20   Dist ph [place] NI          Tonometry (Tonopen, 2:59 PM)       Right Left   Pressure 09 10  Pupils       Pupils Dark Light Shape React APD   Right PERRL 3 3 Round Minimal None   Left PERRL 3 3 Round Minimal None         Visual Fields (Counting fingers)       Left Right    Full Full         Extraocular Movement       Right Left    Full Full         Neuro/Psych     Oriented x3: Yes         Dilation     Both eyes: 1.0% Mydriacyl, 2.5% Phenylephrine @ 2:59 PM           Slit Lamp and Fundus Exam     External Exam       Right Left   External Normal Normal         Slit Lamp Exam       Right Left   Lids/Lashes Normal Normal   Conjunctiva/Sclera White and quiet White and quiet   Cornea Clear Clear   Anterior Chamber Deep and quiet Deep and quiet   Iris Round and reactive Round and reactive   Lens Centered posterior chamber intraocular lens Centered posterior chamber intraocular lens   Vitreous Normal Normal         Fundus Exam       Right Left   Disc Normal Normal   Macula Normal Normal   Vessels Normal Normal   Periphery Normal Normal            IMAGING AND PROCEDURES  Imaging and Procedures for 10/16/20  OCT, Retina - OU - Both Eyes       Right Eye Quality was good. Scan locations included subfoveal. Central Foveal Thickness: 481. Progression has no prior data. Findings include abnormal foveal contour.   Left Eye Quality was good. Scan locations included subfoveal. Central Foveal Thickness: 263. Progression has no prior data. Findings include normal foveal contour.   Notes OD with rather massive CME and CSME.  Appears to be also an epiretinal membrane evident which is hard to know if this is causative or secondary nature OD  OS microaneurysms are seen on red for evaluation yet no CSME noted OS at all     Color Fundus Photography Optos - OU - Both Eyes        Right Eye Progression has no prior data. Disc findings include normal observations. Macula : geographic atrophy.   Left Eye Progression has no prior data. Disc findings include normal observations. Macula : geographic atrophy.   Notes Mild NPDR OU at most.  Rare microaneurysms evident in the para macular region left eye.     Intravitreal Injection, Pharmacologic Agent - OD - Right Eye       Time Out 10/16/2020. 3:38 PM. Confirmed correct patient, procedure, site, and patient consented.   Anesthesia Topical anesthesia was used. Anesthetic medications included [place] 3.5%.   Procedure Preparation included Tobramycin 0.3%, 10% betadine to eyelids, 5% betadine to ocular surface. A 30 gauge needle was used.   Injection: 2.5 mg bevacizumab 2.5 MG/0.1ML   Route: Intravitreal, Site: Right Eye   NDC: [phone], Lot: [id number]   Post-op Post injection exam found visual acuity of at least counting fingers. The patient tolerated the procedure well. There were no complications. The patient received written and verbal post procedure care education. Post injection medications included ocuflox.  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  This rather diffuse CME and also that noted on fluorescein angiography of Dr. 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  The angiograms are limited in scope but I do not see extensive diabetic retinopathy otherwise.  For this reason we will treat today with intravitreal Avastin to maintain ongoing therapy which may have suppressed progression of NPDR by ongoing antivegF, and allow for some resolution.  Nonetheless we will recheck in 2 weeks via OCT for  efficacy of this antivegF therapy  Macular pucker, right eye OD with epiretinal membrane which might be secondary to the CME but also may be a component of CME progression and now not responsive to antivegF OD.     ICD-10-CM   1. 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 Pseudophakia of both eyes  Z96.1     3. Macular pucker, right eye  H35.371 OCT, Retina - OU - Both Eyes    4. Mild nonproliferative diabetic retinopathy of left eye associated with type 1 diabetes mellitus, macular edema presence unspecified ([place])  N23.5573       1.  OD with very significant center involved CME possibly CSME.  However sparse peripheral nonproliferative disease is noted in each eye.  Very unusual to have this degree of CME with sparse early and mild NPDR at most moderate NPDR seen in either eye.  AntivegF for long period of time can obviously suppress the progression and the eye being treated yet not contralaterally.  2.  Will treat OD today and follow-up in 2 weeks with a nondilated OCT to look for sensitivity to treatment in the right eye.  3.  OD, encourage patient not to compress mash or rub the eye because of the issue of chafing of the iris potentially triggering secondary CME  Will contemplate possibly adding an NSAID therapy if the right eye is not showing signs of responsiveness to antivegF therapy upon return  Ophthalmic Meds Ordered this visit:  Meds ordered this encounter  Medications   bevacizumab (AVASTIN) SOSY 2.5 mg       Return in about 2 weeks (around 10/30/2020) for NO DILATE, OCT.  There are no Patient Instructions on file for this visit.   Explained the diagnoses, plan, and follow up with the patient  and they expressed understanding.  Patient expressed understanding of the importance of proper follow up care.   Clent Demark Tyberius Ryner M.D. Diseases & Surgery of the Retina and Vitreous Retina [facility] Diabetic [place] 10/16/20     Abbreviations: M myopia (nearsighted); A astigmatism; H hyperopia (farsighted); P presbyopia; Mrx spectacle prescription;  CTL contact lenses; OD right eye; OS left eye; OU both eyes  XT exotropia; ET esotropia; PEK punctate epithelial keratitis; PEE punctate epithelial erosions; DES dry eye syndrome; MGD meibomian gland dysfunction; ATs artificial tears; PFAT's preservative free artificial tears; [place] nuclear sclerotic cataract; PSC posterior subcapsular cataract; ERM epi-retinal membrane; PVD posterior  vitreous detachment; RD retinal detachment; DM diabetes mellitus; DR diabetic retinopathy; NPDR non-proliferative diabetic retinopathy; PDR proliferative diabetic retinopathy; CSME clinically significant macular edema; DME diabetic macular edema; dbh dot blot hemorrhages; CWS cotton wool spot; POAG primary open angle glaucoma; C/D cup-to-disc ratio; HVF humphrey visual field; GVF goldmann visual field; OCT optical coherence tomography; IOP intraocular pressure; BRVO Branch retinal vein occlusion; CRVO central retinal vein occlusion; CRAO central retinal artery occlusion; BRAO branch retinal artery occlusion; RT retinal tear; SB scleral buckle; PPV pars plana vitrectomy; VH Vitreous hemorrhage; PRP panretinal laser photocoagulation; IVK intravitreal kenalog; VMT vitreomacular traction; MH Macular hole;  NVD neovascularization of the disc; NVE neovascularization elsewhere; AREDS age related eye disease study; ARMD age related macular degeneration; POAG primary open angle glaucoma; EBMD epithelial/anterior basement membrane dystrophy; ACIOL anterior chamber intraocular lens; IOL intraocular lens; PCIOL posterior chamber intraocular lens; Phaco/IOL phacoemulsification with intraocular lens  placement; [place] photorefractive keratectomy; LASIK laser assisted in situ keratomileusis; HTN hypertension; DM diabetes mellitus; COPD chronic obstructive pulmonary disease

## 2020-10-16 NOTE — Assessment & Plan Note (Signed)
OD with epiretinal membrane which might be secondary to the CME but also may be a component of CME progression and now not responsive to antivegF OD.

## 2020-10-16 NOTE — Assessment & Plan Note (Signed)
Stable OU 

## 2020-10-24 ENCOUNTER — Ambulatory Visit: Payer: Medicare Other | Admitting: Physical Therapy

## 2020-10-29 ENCOUNTER — Other Ambulatory Visit: Payer: Self-pay

## 2020-10-29 ENCOUNTER — Encounter (INDEPENDENT_AMBULATORY_CARE_PROVIDER_SITE_OTHER): Payer: Self-pay | Admitting: Ophthalmology

## 2020-10-29 ENCOUNTER — Ambulatory Visit (INDEPENDENT_AMBULATORY_CARE_PROVIDER_SITE_OTHER): Payer: Medicare Other | Admitting: Ophthalmology

## 2020-10-29 DIAGNOSIS — G4733 Obstructive sleep apnea (adult) (pediatric): Secondary | ICD-10-CM

## 2020-10-29 DIAGNOSIS — H35371 Puckering of macula, right eye: Secondary | ICD-10-CM

## 2020-10-29 DIAGNOSIS — E113211 Type 2 diabetes mellitus with mild nonproliferative diabetic retinopathy with macular edema, right eye: Secondary | ICD-10-CM | POA: Diagnosis not present

## 2020-10-29 NOTE — Assessment & Plan Note (Signed)
OD, CME has improved without any recent therapy intravitreally patient is only using topical Prolensa most days once daily.  Even with incomplete compliance slightly less CME is present overall in the right eye.  I spent nearly 15 minutes explaining to the patient's critical importance of preventing nightly hypoxic damage from untreated sleep apnea.  Gerald [name] has moderate sleep apnea with documentation of 30 AHI's per hour approximately as recent as December 2020.  Gerald [name] was noncompliant with the CPAP machine Gerald [name] was given because Gerald [name] had difficulty adjusting.  I have urged the patient to reconsider its use and to make every effort to make it work for a period of 2 to 3 weeks and then reassess his sense of wellbeing and whether his sleep cycle will change improve and otherwise improved sense of wellbeing and potentially helping his eyes

## 2020-10-29 NOTE — Assessment & Plan Note (Addendum)
Patient reports not using CPAP saying "he does not have it anymore, the OSA   I spent nearly 15 minutes explaining to the patient's critical importance of preventing nightly hypoxic damage from untreated sleep apnea.  He has moderate sleep apnea with documentation of 30 AHI's per hour approximately as recent as December 2020.  He was noncompliant with the CPAP machine he was given because he had difficulty adjusting.  I have urged the patient to reconsider its use and to make every effort to make it work for a period of 2 to 3 weeks and then reassess his sense of wellbeing and whether his sleep cycle will change improve and otherwise improved sense of wellbeing and potentially helping his eyes

## 2020-10-29 NOTE — Assessment & Plan Note (Signed)
Mild to moderate epiretinal membrane with possible secondary CME

## 2020-10-29 NOTE — Progress Notes (Signed)
10/29/2020     CHIEF COMPLAINT Patient presents for  Chief Complaint  Patient presents with   Retina Follow Up      HISTORY OF PRESENT ILLNESS: Gerald [name] is a 75 y.o. male who presents to the clinic today for:   HPI     Retina Follow Up   Patient presents with  Diabetic Retinopathy.  This started 2 weeks ago.  Severity is mild.  Duration of 2 weeks.  Since onset it is stable.        Comments   2 week fu OU oct, NO DILATION. Patient states vision is stable and unchanged since last visit. Denies any new floaters or FOL. Pt states dorzolamide bid OD.      Last edited by Laurin Coder on 10/29/2020  2:12 PM.      Referring physician: [name], DO [address] [place],  [place] [address]  HISTORICAL INFORMATION:   Selected notes from the MEDICAL RECORD NUMBER    Lab Results  Component Value Date   HGBA1C 5.6 06/24/2013     CURRENT MEDICATIONS: Current Outpatient Medications (Ophthalmic Drugs)  Medication Sig   Bromfenac Sodium (PROLENSA) 0.07 % SOLN 1 drop into affected eye   dorzolamide (TRUSOPT) 2 % ophthalmic solution Place 1 drop into the right eye 2 (two) times daily.   PROLENSA 0.07 % SOLN Place 1 drop into the right eye at bedtime.   No current facility-administered medications for this visit. (Ophthalmic Drugs)   Current Outpatient Medications (Other)  Medication Sig   ALPRAZolam (XANAX) 0.25 MG tablet    amLODipine (NORVASC) 10 MG tablet    ascorbic acid (VITAMIN C) 500 MG tablet 1 tablet   aspirin 81 MG tablet Take 81 mg by mouth daily.   atorvastatin (LIPITOR) 10 MG tablet    bisoprolol-hydrochlorothiazide (ZIAC) 2.5-6.25 MG tablet    buPROPion (WELLBUTRIN XL) 150 MG 24 hr tablet TAKE 1 TABLET (150 MG TOTAL) BY MOUTH EVERY MORNING.   buPROPion (WELLBUTRIN XL) 300 MG 24 hr tablet    Cholecalciferol (VITAMIN D3) 5000 UNITS CAPS Take 5,000 Units by mouth daily.    doxazosin (CARDURA) 8 MG tablet Take 1 tablet (8 mg  total) by mouth daily.   enalapril (VASOTEC) 20 MG tablet Take 20 mg by mouth 2 (two) times daily.   Ferrous Sulfate (IRON) 325 (65 FE) MG TABS Take 65 mg by mouth daily.    Fiber, Guar Gum, CHEW Chew 1 capsule by mouth daily.   FLUoxetine (PROZAC) 40 MG capsule Take 1 capsule (40 mg total) by mouth daily.   furosemide (LASIX) 20 MG tablet Take 20 mg by mouth daily.   Magnesium 250 MG TABS Take 1 tablet by mouth daily.   Melatonin 5 MG SUBL Place 5 mg under the tongue at bedtime as needed (sleep).    Multiple Vitamin (MULTIVITAMIN WITH MINERALS) TABS tablet Take 1 tablet by mouth daily.   nystatin (MYCOSTATIN/NYSTOP) 100000 UNIT/GM POWD Apply 1 g topically 2 (two) times daily as needed (rash).   omeprazole (PRILOSEC) 40 MG capsule Take 1 capsule (40 mg total) by mouth daily.   polyethylene glycol (MIRALAX / GLYCOLAX) packet Take 17 g by mouth daily.   rivastigmine (EXELON) 1.5 MG capsule TAKE 1 CAPSULE(1.5 MG) BY MOUTH TWICE DAILY   Sennosides (EX-LAX PO) Take 1 tablet by mouth daily as needed (constipation).   vitamin B-12 (CYANOCOBALAMIN) 1000 MCG tablet Take 1,000 mcg by mouth daily.   vitamin C (ASCORBIC  ACID) 500 MG tablet 1 tablet   ZINC OXIDE, TOPICAL, 10 % CREA Apply 1 application topically as needed (for rash).   No current facility-administered medications for this visit. (Other)      REVIEW OF SYSTEMS:    ALLERGIES No Known Allergies  PAST MEDICAL HISTORY Past Medical History:  Diagnosis Date   Abnormality of gait 10/05/2014   Anxiety    Bilateral edema of lower extremity    LE Dopplers 4/22 - no thrombus or thrombophelbitis, no reflux   Colon polyp    DDD (degenerative disc disease)    Depression    Diabetes mellitus without complication ([name])    controlled by diet   DJD (degenerative joint disease)    DOE (dyspnea on exertion)    Echo 04 /22/2014 - normal EF, mild LVH, grade 1 Diastolic Dysfunction; Lexiscan Myoview 04/28/12 - no ishcemia or infarction, LBBB  septal wall motion; EF ~60%   Fatigue    GERD (gastroesophageal reflux disease)    H/O iron deficiency anemia    with  hemmorrhoids   Hyperlipidemia    Hypertension    Left bundle branch block     chronic, negative Myoview a normal echo as noted above    Osteoarthritis    with bilateral hip surgeries and multiple complication, with the most recent  surgery in January 2012   Scrotal edema    Type 2 diabetes mellitus without complications ([place]) 78/46/9629   Vitamin D deficiency    Past Surgical History:  Procedure Laterality Date   Cardiology Nuclear Med Study   04 24 2014   overall impression ; NORMAL  STRESS  NUCLEAR STUDY   HIP SURGERY  2004   Venous Duplex  04 22 2014   LOWER EXTREMITY SWELLING -Impressions [name] ; This is a normal bilateral  lower extremity venous duplex Doppler evaluation    FAMILY HISTORY Family History  Problem Relation Age of Onset   Dementia Mother    Hypertension Mother    Heart disease Mother    Diabetes Mother    Heart attack Maternal Grandmother    Diabetes Maternal Grandfather     SOCIAL HISTORY Social History   Tobacco Use   Smoking status: Former    Packs/day: 0.50    Years: 30.00    Pack years: 15.00    Types: Cigarettes    Quit date: 03/30/1983    Years since quitting: 37.6   Smokeless tobacco: Never  Substance Use Topics   Alcohol use: Yes    Alcohol/week: 1.0 standard drink    Types: 1 Standard drinks or equivalent per week    Comment: couple times a year   Drug use: No         OPHTHALMIC EXAM:  Base Eye Exam     Visual Acuity (ETDRS)       Right Left   Dist [place] 20/200 +1 20/20   Dist ph [place] 20/40          Tonometry (Tonopen, 2:17 PM)       Right Left   Pressure 7 5         Pupils       Pupils Dark Light Shape React APD   Right PERRL 3 2 Round Brisk None   Left PERRL 3 2 Round Brisk None         Extraocular Movement       Right Left    Full Full         Neuro/Psych  Oriented x3:  Yes   Mood/Affect: Normal         Dilation     Both eyes: No dilation            Slit Lamp and Fundus Exam     External Exam       Right Left   External Normal Normal         Slit Lamp Exam       Right Left   Lids/Lashes Normal Normal   Conjunctiva/Sclera White and quiet White and quiet   Cornea Clear Clear   Anterior Chamber Deep and quiet Deep and quiet   Iris Round and reactive Round and reactive   Lens Centered posterior chamber intraocular lens Centered posterior chamber intraocular lens   Anterior Vitreous Normal Normal            IMAGING AND PROCEDURES  Imaging and Procedures for 10/29/20  OCT, Retina - OU - Both Eyes       Right Eye Quality was good. Scan locations included subfoveal. Central Foveal Thickness: 481. Progression has improved. Findings include abnormal foveal contour.   Left Eye Quality was good. Scan locations included subfoveal. Central Foveal Thickness: 261. Progression has been stable. Findings include normal foveal contour.   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  We will continue with topical NSAIDs  OS microaneurysms are seen on red for evaluation yet no CSME noted OS at all             ASSESSMENT/PLAN:  Obstructive sleep apnea syndrome Patient reports not using CPAP saying "he does not have it anymore, the OSA   I spent nearly 15 minutes explaining to the patient's critical importance of preventing nightly hypoxic damage from untreated sleep apnea.  He has moderate sleep apnea with documentation of 30 AHI's per hour approximately as recent as December 2020.  He was noncompliant with the CPAP machine he was given because he had difficulty adjusting.  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  Even with incomplete compliance slightly less CME is present overall in the right eye.  I spent nearly 15 minutes explaining to the patient's critical importance of preventing nightly hypoxic damage from untreated sleep apnea.  He has moderate sleep apnea with documentation of 30 AHI's per hour approximately as recent as December 2020.  He was noncompliant with the CPAP machine he was given because he had difficulty adjusting.  I have urged the patient to reconsider its use and to make every effort to make it work for a period of 2 to 3 weeks and then reassess his sense of wellbeing and whether his sleep cycle will change improve and otherwise improved sense of wellbeing and potentially helping his eyes     ICD-10-CM   1. 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 Obstructive sleep apnea syndrome  G47.33     3. Macular pucker, right eye  H35.371       1.  OD with mild CME, possible CSME overall improved on topical Prolensa I will asked that he continue this once nightly since he has improved however I also believe there is some likelihood that this could be some component of nightly hypoxia induced by untreated sleep apnea which she has clear diagnosis and has not been compliant nor was able to to complete therapy.  Thus he no longer is has the machine in and its attachments  2.  I have asked the patient to follow-up with [name], for consideration of restarting CPAP and/or similar therapy  for his sleep apnea to maximize nightly oxygenation to the brain and to the eye  3.  I have explained the patient that the center part of the eye, the macula requires more oxygen in any other part of the body.  Ophthalmic Meds Ordered this visit:  No orders of the defined types were placed in this encounter.      Return in about 8 weeks (around 12/24/2020) for DILATE OU, OCT.  There are no Patient Instructions on file for this visit.   Explained the diagnoses, plan, and follow up with the patient and they expressed understanding.  Patient expressed understanding of the importance of proper follow up care.   Clent Demark Donald Memoli M.D. Diseases & Surgery of the Retina and Vitreous Retina [facility] Diabetic [place] 10/29/20     Abbreviations: M myopia (nearsighted); A astigmatism; H hyperopia (farsighted); P presbyopia; Mrx spectacle prescription;  CTL contact lenses; OD right eye; OS left eye; OU both eyes  XT exotropia; ET esotropia; PEK punctate epithelial keratitis; PEE punctate epithelial erosions; DES dry eye syndrome; MGD meibomian gland dysfunction; ATs artificial tears; PFAT's preservative free artificial tears; [place] nuclear sclerotic cataract; PSC posterior subcapsular cataract; ERM epi-retinal membrane; PVD posterior vitreous detachment; RD retinal detachment; DM diabetes mellitus; DR diabetic retinopathy; NPDR non-proliferative diabetic retinopathy; PDR proliferative diabetic retinopathy; CSME clinically significant macular edema; DME diabetic macular edema; dbh dot blot hemorrhages; CWS cotton wool spot; POAG primary open angle glaucoma; C/D cup-to-disc ratio; HVF humphrey visual field; GVF goldmann visual field; OCT optical coherence tomography; IOP intraocular pressure; BRVO Branch retinal vein occlusion; CRVO central retinal vein occlusion; CRAO central retinal artery occlusion; BRAO branch retinal artery occlusion; RT retinal tear; SB scleral buckle; PPV pars plana vitrectomy; VH Vitreous  hemorrhage; PRP panretinal laser photocoagulation; IVK intravitreal kenalog; VMT vitreomacular traction; MH Macular hole;  NVD neovascularization of the disc; NVE neovascularization elsewhere; AREDS age related eye disease study; ARMD age related macular degeneration; POAG primary open angle glaucoma; EBMD epithelial/anterior basement membrane dystrophy; ACIOL anterior chamber intraocular lens; IOL intraocular lens; PCIOL posterior chamber intraocular lens; Phaco/IOL phacoemulsification with intraocular lens placement; [place] photorefractive keratectomy; LASIK laser assisted in situ keratomileusis; HTN hypertension; DM diabetes mellitus; COPD chronic obstructive pulmonary disease

## 2020-10-30 ENCOUNTER — Ambulatory Visit: Payer: Medicare Other | Admitting: Physical Therapy

## 2020-10-30 ENCOUNTER — Encounter: Payer: Self-pay | Admitting: Physical Therapy

## 2020-10-30 DIAGNOSIS — R2689 Other abnormalities of gait and mobility: Secondary | ICD-10-CM

## 2020-10-30 DIAGNOSIS — R2681 Unsteadiness on feet: Secondary | ICD-10-CM | POA: Diagnosis not present

## 2020-10-30 DIAGNOSIS — R262 Difficulty in walking, not elsewhere classified: Secondary | ICD-10-CM | POA: Diagnosis not present

## 2020-10-30 DIAGNOSIS — M6281 Muscle weakness (generalized): Secondary | ICD-10-CM

## 2020-10-30 NOTE — Therapy (Signed)
[address] [place], [place], [address] Phone: [phone]   Fax:  [phone]  Physical Therapy Treatment  Patient Details  Name: Gerald [name] MRN: [id number] Date of Birth: 06-04-1945 No data recorded  Encounter Date: 10/30/2020   PT End of Session - 10/30/20 1751     Visit Number 2    Number of Visits 17    Date for PT Re-Evaluation 12/10/20    PT Start Time 4628    PT Stop Time 1657    PT Time Calculation (min) 40 min    Activity Tolerance Patient tolerated treatment well    Behavior During Therapy [facility] for tasks assessed/performed             Past Medical History:  Diagnosis Date   Abnormality of gait 10/05/2014   Anxiety    Bilateral edema of lower extremity    LE Dopplers 4/22 - no thrombus or thrombophelbitis, no reflux   Colon polyp    DDD (degenerative disc disease)    Depression    Diabetes mellitus without complication ([place])    controlled by diet   DJD (degenerative joint disease)    DOE (dyspnea on exertion)    Echo 04 /22/2014 - normal EF, mild LVH, grade 1 Diastolic Dysfunction; Lexiscan Myoview 04/28/12 - no ishcemia or infarction, LBBB septal wall motion; EF ~60%   Fatigue    GERD (gastroesophageal reflux disease)    H/O iron deficiency anemia    with  hemmorrhoids   Hyperlipidemia    Hypertension    Left bundle branch block     chronic, negative Myoview a normal echo as noted above    Osteoarthritis    with bilateral hip surgeries and multiple complication, with the most recent  surgery in January 2012   Scrotal edema    Type 2 diabetes mellitus without complications ([place]) 63/81/7711   Vitamin D deficiency     Past Surgical History:  Procedure Laterality Date   Cardiology Nuclear Med Study   04 24 2014   overall impression ; NORMAL  STRESS  NUCLEAR STUDY   HIP SURGERY  2004   Venous Duplex  04 22 2014   LOWER EXTREMITY SWELLING -Impressions [name] ; This is a normal  bilateral  lower extremity venous duplex Doppler evaluation    There were no vitals filed for this visit.   Subjective Assessment - 10/30/20 1620     Subjective Its been a rough day, I've had trouble sleeping in the day and being awake all night. I saw the eye doctor yesterday and they told me I need to get a CPAP machine to help regulate sleep. Balance is my main concern, I've had quite a few falls recently and its getting worse and worse. When I'm in the house I use a slip on for my left foot but its not quite as high as it needs to be which can make me fall too. I often lose balance backwards. Has not been doing a whole lot of anything recently, frustrated because he used to be an active person.    Pertinent History DJD, Depression, DDD    Patient Stated Goals Patient would like to get back to the gym, have more confidence, not need a RW.    Currently in Pain? No/denies  [place] Adult PT Treatment/Exercise - 10/30/20 0001       Exercises   Exercises Knee/Hip      Knee/Hip Exercises: Standing   Other Standing Knee Exercises eccentric stand to sits x5 with mod facilitation      Knee/Hip Exercises: Supine   Bridges Both;2 sets    Bridges Limitations 12 reps    Other Supine Knee/Hip Exercises supine clams red TB 2x12                 Balance Exercises - 10/30/20 0001       Balance Exercises: Standing   Tandem Stance Eyes open;3 reps;15 secs    Other Standing Exercises posterior leans onto wall using ankle DFs to pull self forward x10; stepping strategy to correct posterior LOB x6 B    Other Standing Exercises Comments standing on foam pad- multidirectional pertrubations/correction exercise                PT Education - 10/30/20 1751     Education Details exercise form and purpose    Person(s) Educated Patient    Methods Explanation    Comprehension Verbalized understanding              PT Short Term Goals -  10/15/20 1646       PT SHORT TERM GOAL #1   Title Patient will perform basic HEP with MI, using written instructions.    Baseline given initial HEP    Time 2    Period Weeks    Status New    Target Date 10/29/20               PT Long Term Goals - 10/15/20 1647       PT LONG TERM GOAL #1   Title Improve L hip strenght to 3/5    Baseline 2+-(3-)/5    Time 8    Period Weeks    Status New    Target Date 12/10/20      PT LONG TERM GOAL #2   Title Patient will perform advanced HEP MI with written instructions    Baseline Initiated    Time 8    Period Weeks    Status New    Target Date 12/10/20      PT LONG TERM GOAL #3   Title ambualte x at least 500' with LRAD, MI, including uneven surfaces.    Baseline Unsteady with cane, 50', CGA.    Time 8    Period Weeks    Status New    Target Date 12/10/20      PT LONG TERM GOAL #4   Title Up and down 4 steps using U rail, with step to gait pattern, S    Baseline CGA, unsteady    Time 8    Period Weeks    Status New    Target Date 12/10/20      PT LONG TERM GOAL #5   Title Patient will demosntrate improved weight hsift onto LLE in gait iwth longer step length and equal stance time through each LE.    Baseline Step to gait with decreased WB through LLE.    Time 8    Period Weeks    Status New    Target Date 12/10/20                   Plan - 10/30/20 1752     Clinical Impression Statement Mr. Jelinski arrives today a little down- tells me he has  not been sleeping well and is generally discouraged about his balance and increasing number of falls. Has had lots of posterior LOB recently. Spent a good time during today's session working on balance training and reaction time when balance is challenged especially in posterior direction, otherwise focused on strength and endurance. Motivated, but really benefited from some supportive listening and encouragement today. Had a little difficult sequencing novel tasks today.  Continue current POC- really need to focus on fall prevention strategies moving forward, might also consider trial of quad cane next session.    Personal Factors and Comorbidities Age;Comorbidity 3+    Comorbidities leg length discrepancy, arthritis, DDD, L BBB    Examination-Activity Limitations Bathing;Locomotion Level;Transfers;Bend;Sleep;Squat;Stairs;Stand;Lift    Stability/Clinical Decision Making Stable/Uncomplicated    Clinical Decision Making Moderate    Rehab Potential Good    PT Frequency 2x / week    PT Duration 8 weeks    PT Treatment/Interventions ADLs/Self Care Home Management;Electrical Stimulation;DME Instruction;Moist Heat;Iontophoresis 4mg /ml Dexamethasone;Gait training;Stair training;Functional mobility training;Therapeutic activities;Therapeutic exercise;Balance training;Neuromuscular re-education;Manual techniques;Patient/family education    PT Next Visit Plan Trunk and LE strengthening, balance training. Trial quad cane?    PT Home Exercise Plan Initiated    Consulted and Agree with Plan of Care Patient             Patient will benefit from skilled therapeutic intervention in order to improve the following deficits and impairments:  Abnormal gait, Decreased coordination, Difficulty walking, Cardiopulmonary status limiting activity, Decreased endurance, Decreased activity tolerance, Decreased balance, Decreased mobility, Decreased strength, Impaired flexibility, Improper body mechanics  Visit Diagnosis: Muscle weakness (generalized)  Other abnormalities of gait and mobility  Unsteadiness on feet  Difficulty in walking, not elsewhere classified     Problem List Patient Active Problem List   Diagnosis Date Noted   Pseudophakia of both eyes 10/16/2020   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 ([place]) 10/16/2020   Macular pucker, right eye 10/16/2020   Mild nonproliferative diabetic  retinopathy of left eye ([place]) 10/16/2020   Anxiety state 01/04/2020   Benign prostatic hyperplasia without lower urinary tract symptoms 01/04/2020   Chronic diastolic heart failure ([place]) 01/04/2020   Diabetic renal disease ([place]) 01/04/2020   Diabetic retinopathy ([place]) 01/04/2020   ED (erectile dysfunction) of organic origin 01/04/2020   Hypertrophic cardiomyopathy ([place]) 01/04/2020   Insomnia 01/04/2020   Left bundle branch block 01/04/2020   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   Primary localized osteoarthritis of pelvic region and thigh 01/04/2020   Stage 3 chronic kidney disease ([place]) 01/04/2020   Vitamin B12 deficiency (non anemic) 01/04/2020   Recurrent major depression ([place]) 01/04/2020   Severe episode of recurrent major depressive disorder, without psychotic features ([place]) 10/05/2018   Cognitive complaints with normal neuropsychological exam 03/11/2018   Referred otalgia of left ear 09/23/2016   Temporomandibular joint (TMJ) pain 09/23/2016   Cervicogenic headache 09/03/2016   Obstructive sleep apnea syndrome 11/15/2015   Memory difficulties 10/05/2014   Abnormality of gait 10/05/2014   Intermittent explosive disorder 09/25/2014   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   Encounter for long-term (current) use of other medications 04/28/2013   Vitamin D Deficiency 04/28/2013   Sleep disorder -Epworth OSA scale = 15 06/02/2012   Anal fissure 08/09/2010   Septic arthritis of hip ([place]) 04/11/2010   HYPERLIPIDEMIA 01/11/2010   DEPRESSION 01/11/2010   HYPERTENSION 01/11/2010   GERD 01/11/2010   Depression 01/11/2010   Hyperlipidemia  01/11/2010   T2 NIDDM w/Stage 2 CKD 09/21/2008   ANEMIA, IRON DEFICIENCY 09/21/2008   Personal history of colonic polyps 09/21/2008   Anemia, iron deficiency 09/21/2008   Ann Lions PT, DPT, PN2   Supplemental Physical Therapist [facility]    Pager [phone] Acute Rehab Office [place]. [place], [place], [address] Phone: [phone]   Fax:  [phone]  Name: Gerald [name] MRN: [id number] Date of Birth: 06/30/1945

## 2020-11-01 ENCOUNTER — Ambulatory Visit: Payer: Medicare Other | Admitting: Physical Therapy

## 2020-11-01 ENCOUNTER — Other Ambulatory Visit: Payer: Self-pay

## 2020-11-01 ENCOUNTER — Encounter: Payer: Self-pay | Admitting: Physical Therapy

## 2020-11-01 DIAGNOSIS — R2681 Unsteadiness on feet: Secondary | ICD-10-CM | POA: Diagnosis not present

## 2020-11-01 DIAGNOSIS — R2689 Other abnormalities of gait and mobility: Secondary | ICD-10-CM | POA: Diagnosis not present

## 2020-11-01 DIAGNOSIS — R262 Difficulty in walking, not elsewhere classified: Secondary | ICD-10-CM

## 2020-11-01 DIAGNOSIS — M6281 Muscle weakness (generalized): Secondary | ICD-10-CM

## 2020-11-01 NOTE — Therapy (Signed)
[place]. [place], [place], [address] Phone: [phone]   Fax:  [phone]  Physical Therapy Treatment  Patient Details  Name: Gerald [name] MRN: [id number] Date of Birth: 11-14-1945 No data recorded  Encounter Date: 11/01/2020   PT End of Session - 11/01/20 1649     Visit Number 3    Number of Visits 17    Date for PT Re-Evaluation 12/10/20    PT Start Time 1600    PT Stop Time 1645    PT Time Calculation (min) 45 min    Activity Tolerance Patient tolerated treatment well    Behavior During Therapy [facility] for tasks assessed/performed             Past Medical History:  Diagnosis Date   Abnormality of gait 10/05/2014   Anxiety    Bilateral edema of lower extremity    LE Dopplers 4/22 - no thrombus or thrombophelbitis, no reflux   Colon polyp    DDD (degenerative disc disease)    Depression    Diabetes mellitus without complication ([place])    controlled by diet   DJD (degenerative joint disease)    DOE (dyspnea on exertion)    Echo 04 /22/2014 - normal EF, mild LVH, grade 1 Diastolic Dysfunction; Lexiscan Myoview 04/28/12 - no ishcemia or infarction, LBBB septal wall motion; EF ~60%   Fatigue    GERD (gastroesophageal reflux disease)    H/O iron deficiency anemia    with  hemmorrhoids   Hyperlipidemia    Hypertension    Left bundle branch block     chronic, negative Myoview a normal echo as noted above    Osteoarthritis    with bilateral hip surgeries and multiple complication, with the most recent  surgery in January 2012   Scrotal edema    Type 2 diabetes mellitus without complications ([place]) 13/24/4010   Vitamin D deficiency     Past Surgical History:  Procedure Laterality Date   Cardiology Nuclear Med Study   04 24 2014   overall impression ; NORMAL  STRESS  NUCLEAR STUDY   HIP SURGERY  2004   Venous Duplex  04 22 2014   LOWER EXTREMITY SWELLING -Impressions [name] ; This is a normal  bilateral  lower extremity venous duplex Doppler evaluation    There were no vitals filed for this visit.   Subjective Assessment - 11/01/20 1601     Subjective Doing ok today, no recent falls.    Currently in Pain? No/denies                               [facility] Adult PT Treatment/Exercise - 11/01/20 0001       High Level Balance   High Level Balance Comments Standing ball toss      Knee/Hip Exercises: Aerobic   Nustep L5 x4 min, L3 LE only x2 min      Knee/Hip Exercises: Machines for Strengthening   Cybex Leg Press 20lb 2x10, LLE 20lb 2x5      Knee/Hip Exercises: Standing   Hip ADduction Strengthening;Both;1 set;10 reps   with RW   Other Standing Knee Exercises Alt 4 in box taps 2x5 each      Knee/Hip Exercises: Seated   Long Arc Quad Strengthening;Both;2 sets;10 reps    Long Arc Quad Weight 2 lbs.    Marching Strengthening;Both;2 sets;10 reps    [employer] 2  lbs.    Hamstring Curl Strengthening;Both;2 sets;10 reps    Hamstring Limitations red tband    Sit to Sand 2 sets;10 reps;with UE support   slightly elevated mat table                      PT Short Term Goals - 11/01/20 1655       PT SHORT TERM GOAL #1   Title Patient will perform basic HEP with MI, using written instructions.    Status Partially Met               PT Long Term Goals - 10/15/20 1647       PT LONG TERM GOAL #1   Title Improve L hip strenght to 3/5    Baseline 2+-(3-)/5    Time 8    Period Weeks    Status New    Target Date 12/10/20      PT LONG TERM GOAL #2   Title Patient will perform advanced HEP MI with written instructions    Baseline Initiated    Time 8    Period Weeks    Status New    Target Date 12/10/20      PT LONG TERM GOAL #3   Title ambualte x at least 500' with LRAD, MI, including uneven surfaces.    Baseline Unsteady with cane, 50', CGA.    Time 8    Period Weeks    Status New    Target Date 12/10/20      PT LONG  TERM GOAL #4   Title Up and down 4 steps using U rail, with step to gait pattern, S    Baseline CGA, unsteady    Time 8    Period Weeks    Status New    Target Date 12/10/20      PT LONG TERM GOAL #5   Title Patient will demosntrate improved weight hsift onto LLE in gait iwth longer step length and equal stance time through each LE.    Baseline Step to gait with decreased WB through LLE.    Time 8    Period Weeks    Status New    Target Date 12/10/20                   Plan - 11/01/20 1650     Clinical Impression Statement Progressed pt to more strengthening interventions. Cue needed to complete full ROM with HS curls and extensions. Cue needed to hold contraction with LAQ. Time spent on sit to stands not allowing LE to push against table. Some posterior leaning requiring min assist for stability with alt box taps. Cue to use LLE with bilat leg press.    Personal Factors and Comorbidities Age;Comorbidity 3+    Comorbidities leg length discrepancy, arthritis, DDD, L BBB    Examination-Activity Limitations Bathing;Locomotion Level;Transfers;Bend;Sleep;Squat;Stairs;Stand;Lift    Stability/Clinical Decision Making Stable/Uncomplicated    Rehab Potential Good    PT Frequency 2x / week    PT Duration 8 weeks    PT Treatment/Interventions ADLs/Self Care Home Management;Electrical Stimulation;DME Instruction;Moist Heat;Iontophoresis 4mg /ml Dexamethasone;Gait training;Stair training;Functional mobility training;Therapeutic activities;Therapeutic exercise;Balance training;Neuromuscular re-education;Manual techniques;Patient/family education    PT Next Visit Plan Trunk and LE strengthening, balance training. Trial quad cane?             Patient will benefit from skilled therapeutic intervention in order to improve the following deficits and impairments:  Abnormal gait, Decreased coordination, Difficulty walking, Cardiopulmonary status  limiting activity, Decreased endurance,  Decreased activity tolerance, Decreased balance, Decreased mobility, Decreased strength, Impaired flexibility, Improper body mechanics  Visit Diagnosis: Muscle weakness (generalized)  Unsteadiness on feet  Difficulty in walking, not elsewhere classified  Other abnormalities of gait and mobility     Problem List Patient Active Problem List   Diagnosis Date Noted   Pseudophakia of both eyes 10/16/2020   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 ([place]) 10/16/2020   Macular pucker, right eye 10/16/2020   Mild nonproliferative diabetic retinopathy of left eye ([place]) 10/16/2020   Anxiety state 01/04/2020   Benign prostatic hyperplasia without lower urinary tract symptoms 01/04/2020   Chronic diastolic heart failure ([place]) 01/04/2020   Diabetic renal disease ([place]) 01/04/2020   Diabetic retinopathy ([place]) 01/04/2020   ED (erectile dysfunction) of organic origin 01/04/2020   Hypertrophic cardiomyopathy ([place]) 01/04/2020   Insomnia 01/04/2020   Left bundle branch block 01/04/2020   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   Primary localized osteoarthritis of pelvic region and thigh 01/04/2020   Stage 3 chronic kidney disease ([place]) 01/04/2020   Vitamin B12 deficiency (non anemic) 01/04/2020   Recurrent major depression ([place]) 01/04/2020   Severe episode of recurrent major depressive disorder, without psychotic features ([place]) 10/05/2018   Cognitive complaints with normal neuropsychological exam 03/11/2018   Referred otalgia of left ear 09/23/2016   Temporomandibular joint (TMJ) pain 09/23/2016   Cervicogenic headache 09/03/2016   Obstructive sleep apnea syndrome 11/15/2015   Memory difficulties 10/05/2014   Abnormality of gait 10/05/2014   Intermittent explosive disorder 09/25/2014   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   Encounter for long-term (current) use of other medications  04/28/2013   Vitamin D Deficiency 04/28/2013   Sleep disorder -Epworth OSA scale = 15 06/02/2012   Anal fissure 08/09/2010   Septic arthritis of hip ([place]) 04/11/2010   HYPERLIPIDEMIA 01/11/2010   DEPRESSION 01/11/2010   HYPERTENSION 01/11/2010   GERD 01/11/2010   Depression 01/11/2010   Hyperlipidemia 01/11/2010   T2 NIDDM w/Stage 2 CKD 09/21/2008   ANEMIA, IRON DEFICIENCY 09/21/2008   Personal history of colonic polyps 09/21/2008   Anemia, iron deficiency 09/21/2008    Scot Jun, PTA 11/01/2020, 4:56 PM  [place]. [place], [place], [address] Phone: [phone]   Fax:  [phone]  Name: Gerald [name] MRN: [id number] Date of Birth: [birth year]

## 2020-11-05 DIAGNOSIS — N189 Chronic kidney disease, unspecified: Secondary | ICD-10-CM | POA: Diagnosis not present

## 2020-11-05 DIAGNOSIS — E559 Vitamin D deficiency, unspecified: Secondary | ICD-10-CM | POA: Diagnosis not present

## 2020-11-05 DIAGNOSIS — D631 Anemia in chronic kidney disease: Secondary | ICD-10-CM | POA: Diagnosis not present

## 2020-11-05 DIAGNOSIS — N179 Acute kidney failure, unspecified: Secondary | ICD-10-CM | POA: Diagnosis not present

## 2020-11-05 DIAGNOSIS — N1832 Chronic kidney disease, stage 3b: Secondary | ICD-10-CM | POA: Diagnosis not present

## 2020-11-05 DIAGNOSIS — I129 Hypertensive chronic kidney disease with stage 1 through stage 4 chronic kidney disease, or unspecified chronic kidney disease: Secondary | ICD-10-CM | POA: Diagnosis not present

## 2020-11-06 ENCOUNTER — Other Ambulatory Visit: Payer: Self-pay

## 2020-11-06 ENCOUNTER — Encounter: Payer: Self-pay | Admitting: Physical Therapy

## 2020-11-06 ENCOUNTER — Ambulatory Visit: Payer: Medicare Other | Attending: Neurology | Admitting: Physical Therapy

## 2020-11-06 DIAGNOSIS — N1832 Chronic kidney disease, stage 3b: Secondary | ICD-10-CM | POA: Diagnosis not present

## 2020-11-06 DIAGNOSIS — M6281 Muscle weakness (generalized): Secondary | ICD-10-CM | POA: Diagnosis not present

## 2020-11-06 DIAGNOSIS — R262 Difficulty in walking, not elsewhere classified: Secondary | ICD-10-CM

## 2020-11-06 DIAGNOSIS — R2689 Other abnormalities of gait and mobility: Secondary | ICD-10-CM

## 2020-11-06 DIAGNOSIS — R2681 Unsteadiness on feet: Secondary | ICD-10-CM | POA: Diagnosis not present

## 2020-11-06 NOTE — Therapy (Signed)
[place]. [place], [place], [address] Phone: [phone]   Fax:  [phone]  Physical Therapy Treatment  Patient Details  Name: Gerald [name] MRN: [id number] Date of Birth: [birth year] No data recorded  Encounter Date: 11/06/2020   PT End of Session - 11/06/20 1701     Visit Number 4    Number of Visits 17    Date for PT Re-Evaluation 12/10/20    PT Start Time 1618    PT Stop Time 3785    PT Time Calculation (min) 40 min    Equipment Utilized During Treatment Gait belt    Activity Tolerance Patient tolerated treatment well    Behavior During Therapy [facility] for tasks assessed/performed             Past Medical History:  Diagnosis Date   Abnormality of gait 10/05/2014   Anxiety    Bilateral edema of lower extremity    LE Dopplers 4/22 - no thrombus or thrombophelbitis, no reflux   Colon polyp    DDD (degenerative disc disease)    Depression    Diabetes mellitus without complication ([place])    controlled by diet   DJD (degenerative joint disease)    DOE (dyspnea on exertion)    Echo 04 /22/2014 - normal EF, mild LVH, grade 1 Diastolic Dysfunction; Lexiscan Myoview 04/28/12 - no ishcemia or infarction, LBBB septal wall motion; EF ~60%   Fatigue    GERD (gastroesophageal reflux disease)    H/O iron deficiency anemia    with  hemmorrhoids   Hyperlipidemia    Hypertension    Left bundle branch block     chronic, negative Myoview a normal echo as noted above    Osteoarthritis    with bilateral hip surgeries and multiple complication, with the most recent  surgery in January 2012   Scrotal edema    Type 2 diabetes mellitus without complications ([place]) 88/50/2774   Vitamin D deficiency     Past Surgical History:  Procedure Laterality Date   Cardiology Nuclear Med Study   04 24 2014   overall impression ; NORMAL  STRESS  NUCLEAR STUDY   HIP SURGERY  2004   Venous Duplex  04 22 2014   LOWER EXTREMITY  SWELLING -Impressions [name] ; This is a normal bilateral  lower extremity venous duplex Doppler evaluation    There were no vitals filed for this visit.   Subjective Assessment - 11/06/20 1621     Subjective Doing OK today; I felt a little soreness in my legs after last session, I've not had any close calls or actual falls    Pertinent History DJD, Depression, DDD    Patient Stated Goals Patient would like to get back to the gym, have more confidence, not need a RW.    Currently in Pain? No/denies                               [facility] Adult PT Treatment/Exercise - 11/06/20 0001       Ambulation/Gait   Gait Comments gait training with SBQC- much more steady and more balanced with recoprocal gait pattern, good sequencing and use of QC      Knee/Hip Exercises: Standing   Heel Raises Both;1 set;10 reps    Lateral Step Up Both;1 set;10 reps;Hand Hold: 1;Step Height: 4"   4 inch box x10 RLE, 2 inch box x5 L  LE   Forward Step Up Both;1 set;10 reps                 Balance Exercises - 11/06/20 0001       Balance Exercises: Standing   Standing Eyes Closed Narrow base of support (BOS);Foam/compliant surface;3 reps;30 secs    Tandem Stance Eyes open;Foam/compliant surface;3 reps;15 secs    Tandem Gait Forward;4 reps    Retro Gait 3 reps                PT Education - 11/06/20 1701     Education Details exercise form and purpose, QC sequencing and process of getting his own    Person(s) Educated Patient    Methods Explanation    Comprehension Verbalized understanding              PT Short Term Goals - 11/01/20 1655       PT SHORT TERM GOAL #1   Title Patient will perform basic HEP with MI, using written instructions.    Status Partially Met               PT Long Term Goals - 10/15/20 1647       PT LONG TERM GOAL #1   Title Improve L hip strenght to 3/5    Baseline 2+-(3-)/5    Time 8    Period Weeks    Status New    Target  Date 12/10/20      PT LONG TERM GOAL #2   Title Patient will perform advanced HEP MI with written instructions    Baseline Initiated    Time 8    Period Weeks    Status New    Target Date 12/10/20      PT LONG TERM GOAL #3   Title ambualte x at least 500' with LRAD, MI, including uneven surfaces.    Baseline Unsteady with cane, 50', CGA.    Time 8    Period Weeks    Status New    Target Date 12/10/20      PT LONG TERM GOAL #4   Title Up and down 4 steps using U rail, with step to gait pattern, S    Baseline CGA, unsteady    Time 8    Period Weeks    Status New    Target Date 12/10/20      PT LONG TERM GOAL #5   Title Patient will demosntrate improved weight hsift onto LLE in gait iwth longer step length and equal stance time through each LE.    Baseline Step to gait with decreased WB through LLE.    Time 8    Period Weeks    Status New    Target Date 12/10/20                   Plan - 11/06/20 1702     Clinical Impression Statement [name] arrives today wanting to work more on strength and balance- felt really good after last session, liked working on strength last session. Continued progressing balance training, introduced bodyweight strengthening today as well. Tolerated well but does have more difficulty in stance phase on L LE when practicing gait training and with closed chain strength exercises. Did really well using SBQC today, may benefit from one of his own- discussed. L LE grossly weaker in quads and glutes as well, will require focused strengthening moving forward.  Very motivated, anticipate he'll make good progress moving forward.  Personal Factors and Comorbidities Age;Comorbidity 3+    Comorbidities leg length discrepancy, arthritis, DDD, L BBB    Examination-Activity Limitations Bathing;Locomotion Level;Transfers;Bend;Sleep;Squat;Stairs;Stand;Lift    Stability/Clinical Decision Making Stable/Uncomplicated    Clinical Decision Making Moderate     Rehab Potential Good    PT Frequency 2x / week    PT Duration 8 weeks    PT Treatment/Interventions ADLs/Self Care Home Management;Electrical Stimulation;DME Instruction;Moist Heat;Iontophoresis 4mg /ml Dexamethasone;Gait training;Stair training;Functional mobility training;Therapeutic activities;Therapeutic exercise;Balance training;Neuromuscular re-education;Manual techniques;Patient/family education    PT Next Visit Plan Trunk and LE strengthening, balance training. continue gait training with [facility]    PT Home Exercise Plan Initiated    Consulted and Agree with Plan of Care Patient             Patient will benefit from skilled therapeutic intervention in order to improve the following deficits and impairments:  Abnormal gait, Decreased coordination, Difficulty walking, Cardiopulmonary status limiting activity, Decreased endurance, Decreased activity tolerance, Decreased balance, Decreased mobility, Decreased strength, Impaired flexibility, Improper body mechanics  Visit Diagnosis: Muscle weakness (generalized)  Unsteadiness on feet  Difficulty in walking, not elsewhere classified  Other abnormalities of gait and mobility     Problem List Patient Active Problem List   Diagnosis Date Noted   Pseudophakia of both eyes 10/16/2020   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 ([place]) 10/16/2020   Macular pucker, right eye 10/16/2020   Mild nonproliferative diabetic retinopathy of left eye ([place]) 10/16/2020   Anxiety state 01/04/2020   Benign prostatic hyperplasia without lower urinary tract symptoms 01/04/2020   Chronic diastolic heart failure ([place]) 01/04/2020   Diabetic renal disease ([place]) 01/04/2020   Diabetic retinopathy ([place]) 01/04/2020   ED (erectile dysfunction) of organic origin 01/04/2020   Hypertrophic cardiomyopathy ([place]) 01/04/2020   Insomnia 01/04/2020   Left bundle branch block 01/04/2020    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   Primary localized osteoarthritis of pelvic region and thigh 01/04/2020   Stage 3 chronic kidney disease ([place]) 01/04/2020   Vitamin B12 deficiency (non anemic) 01/04/2020   Recurrent major depression ([place]) 01/04/2020   Severe episode of recurrent major depressive disorder, without psychotic features ([place]) 10/05/2018   Cognitive complaints with normal neuropsychological exam 03/11/2018   Referred otalgia of left ear 09/23/2016   Temporomandibular joint (TMJ) pain 09/23/2016   Cervicogenic headache 09/03/2016   Obstructive sleep apnea syndrome 11/15/2015   Memory difficulties 10/05/2014   Abnormality of gait 10/05/2014   Intermittent explosive disorder 09/25/2014   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   Encounter for long-term (current) use of other medications 04/28/2013   Vitamin D Deficiency 04/28/2013   Sleep disorder -Epworth OSA scale = 15 06/02/2012   Anal fissure 08/09/2010   Septic arthritis of hip ([place]) 04/11/2010   HYPERLIPIDEMIA 01/11/2010   DEPRESSION 01/11/2010   HYPERTENSION 01/11/2010   GERD 01/11/2010   Depression 01/11/2010   Hyperlipidemia 01/11/2010   T2 NIDDM w/Stage 2 CKD 09/21/2008   ANEMIA, IRON DEFICIENCY 09/21/2008   Personal history of colonic polyps 09/21/2008   Anemia, iron deficiency 09/21/2008   Ann Lions PT, DPT, PN2   Supplemental Physical Therapist [facility]    Pager [phone] Acute Rehab Office [place]. [place], [place], [address] Phone: [phone]   Fax:  [phone]  Name: EDSON DERIDDER MRN: [id number] Date of Birth: 20-Aug-1945

## 2020-11-07 ENCOUNTER — Encounter: Payer: Self-pay | Admitting: Podiatry

## 2020-11-07 ENCOUNTER — Ambulatory Visit: Payer: Medicare Other | Admitting: Podiatry

## 2020-11-07 DIAGNOSIS — E1122 Type 2 diabetes mellitus with diabetic chronic kidney disease: Secondary | ICD-10-CM

## 2020-11-07 DIAGNOSIS — N183 Chronic kidney disease, stage 3 unspecified: Secondary | ICD-10-CM | POA: Diagnosis not present

## 2020-11-07 DIAGNOSIS — M79674 Pain in right toe(s): Secondary | ICD-10-CM

## 2020-11-07 DIAGNOSIS — R296 Repeated falls: Secondary | ICD-10-CM | POA: Insufficient documentation

## 2020-11-07 DIAGNOSIS — D638 Anemia in other chronic diseases classified elsewhere: Secondary | ICD-10-CM | POA: Insufficient documentation

## 2020-11-07 DIAGNOSIS — M79675 Pain in left toe(s): Secondary | ICD-10-CM | POA: Diagnosis not present

## 2020-11-07 DIAGNOSIS — B351 Tinea unguium: Secondary | ICD-10-CM

## 2020-11-07 DIAGNOSIS — L84 Corns and callosities: Secondary | ICD-10-CM

## 2020-11-07 DIAGNOSIS — D631 Anemia in chronic kidney disease: Secondary | ICD-10-CM | POA: Insufficient documentation

## 2020-11-08 ENCOUNTER — Encounter: Payer: Self-pay | Admitting: Physical Therapy

## 2020-11-08 ENCOUNTER — Other Ambulatory Visit: Payer: Self-pay

## 2020-11-08 ENCOUNTER — Ambulatory Visit: Payer: Medicare Other | Admitting: Physical Therapy

## 2020-11-08 DIAGNOSIS — R2681 Unsteadiness on feet: Secondary | ICD-10-CM | POA: Diagnosis not present

## 2020-11-08 DIAGNOSIS — R2689 Other abnormalities of gait and mobility: Secondary | ICD-10-CM | POA: Diagnosis not present

## 2020-11-08 DIAGNOSIS — R262 Difficulty in walking, not elsewhere classified: Secondary | ICD-10-CM

## 2020-11-08 DIAGNOSIS — M6281 Muscle weakness (generalized): Secondary | ICD-10-CM

## 2020-11-08 NOTE — Therapy (Signed)
[place] [place], [place], [address] Phone: [phone]   Fax:  [phone]  Physical Therapy Treatment  Patient Details  Name: Gerald [name] MRN: [id number] Date of Birth: 1945-04-18 No data recorded  Encounter Date: 11/08/2020   PT End of Session - 11/08/20 1742     Visit Number 5    Number of Visits 17    Date for PT Re-Evaluation 12/10/20    PT Start Time 1620    PT Stop Time 8115    PT Time Calculation (min) 38 min    Equipment Utilized During Treatment Gait belt    Activity Tolerance Patient tolerated treatment well    Behavior During Therapy Gerald [name] for tasks assessed/performed             Past Medical History:  Diagnosis Date   Abnormality of gait 10/05/2014   Anxiety    Bilateral edema of lower extremity    LE Dopplers 4/22 - no thrombus or thrombophelbitis, no reflux   Colon polyp    DDD (degenerative disc disease)    Depression    Diabetes mellitus without complication ([place])    controlled by diet   DJD (degenerative joint disease)    DOE (dyspnea on exertion)    Echo 04 /22/2014 - normal EF, mild LVH, grade 1 Diastolic Dysfunction; Lexiscan Myoview 04/28/12 - no ishcemia or infarction, LBBB septal wall motion; EF ~60%   Fatigue    GERD (gastroesophageal reflux disease)    H/O iron deficiency anemia    with  hemmorrhoids   Hyperlipidemia    Hypertension    Left bundle branch block     chronic, negative Myoview a normal echo as noted above    Osteoarthritis    with bilateral hip surgeries and multiple complication, with the most recent  surgery in January 2012   Scrotal edema    Type 2 diabetes mellitus without complications ([place]) 72/62/0355   Vitamin D deficiency     Past Surgical History:  Procedure Laterality Date   Cardiology Nuclear Med Study   04 24 2014   overall impression ; NORMAL  STRESS  NUCLEAR STUDY   HIP SURGERY  2004   Venous Duplex  04 22 2014   LOWER EXTREMITY  SWELLING -Impressions Gerald [name] ; This is a normal bilateral  lower extremity venous duplex Doppler evaluation    There were no vitals filed for this visit.   Subjective Assessment - 11/08/20 1623     Subjective I felt good after last session, no soreness. Still looking into getting SBQC, its on the to do list.    Pertinent History DJD, Depression, DDD    Patient Stated Goals Patient would like to get back to the gym, have more confidence, not need a RW.    Currently in Pain? No/denies                               [facility] Adult PT Treatment/Exercise - 11/08/20 0001       Ambulation/Gait   Gait Comments gait training outdoors with [facility] over uneven and unpredictable surfaces; did well but did need MinA 25% of the time for balance      Knee/Hip Exercises: Standing   Heel Raises Both;1 set;20 reps    Heel Raises Limitations plus toe raises x20    Wall Squat 1 set;10 reps    Wall Squat Limitations mini squats for  form    Other Standing Knee Exercises L LE x6 with BUEs on 4 inch step focus on eccentric contorl    Other Standing Knee Exercises side stepping x64f to the left, 422fto the right due to L LE weakness/pain                     PT Education - 11/08/20 1741     Education Details exercise from and purpose, QC sequencing over uneven surfaces and navigating up/down curb    Person(s) Educated Patient    Methods Explanation    Comprehension Verbalized understanding;Returned demonstration              PT Short Term Goals - 11/01/20 1655       PT SHORT TERM GOAL #1   Title Patient will perform basic HEP with MI, using written instructions.    Status Partially Met               PT Long Term Goals - 10/15/20 1647       PT LONG TERM GOAL #1   Title Improve L hip strenght to 3/5    Baseline 2+-(3-)/5    Time 8    Period Weeks    Status New    Target Date 12/10/20      PT LONG TERM GOAL #2   Title Patient will perform advanced  HEP MI with written instructions    Baseline Initiated    Time 8    Period Weeks    Status New    Target Date 12/10/20      PT LONG TERM GOAL #3   Title ambualte x at least 500' with LRAD, MI, including uneven surfaces.    Baseline Unsteady with cane, 50', CGA.    Time 8    Period Weeks    Status New    Target Date 12/10/20      PT LONG TERM GOAL #4   Title Up and down 4 steps using U rail, with step to gait pattern, S    Baseline CGA, unsteady    Time 8    Period Weeks    Status New    Target Date 12/10/20      PT LONG TERM GOAL #5   Title Patient will demosntrate improved weight hsift onto LLE in gait iwth longer step length and equal stance time through each LE.    Baseline Step to gait with decreased WB through LLE.    Time 8    Period Weeks    Status New    Target Date 12/10/20                   Plan - 11/08/20 1743     Clinical Impression Statement Mr. WiBarnickrrives today in good spirits- really enjoyed the strengthening activities last session and still working on getting his own quad cane. Spent time using quad cane outside walking over curbs and uneven surfaces/grass/dirt mounds; able to complete with min guard to intermittent MinA for balance, Min cues for sequencing/safety placement of quad cane. Otherwise worked on functional strength today; able to progress LE strengthening exercises somewhat with L LE although it does remain much weaker than the right overall. Responding well to POC, continue as planned.    Personal Factors and Comorbidities Age;Comorbidity 3+    Comorbidities leg length discrepancy, arthritis, DDD, L BBB    Examination-Activity Limitations Bathing;Locomotion Level;Transfers;Bend;Sleep;Squat;Stairs;Stand;Lift    Stability/Clinical Decision Making Stable/Uncomplicated  Clinical Decision Making Moderate    Rehab Potential Good    PT Frequency 2x / week    PT Duration 8 weeks    PT Treatment/Interventions ADLs/Self Care Home  Management;Electrical Stimulation;DME Instruction;Moist Heat;Iontophoresis 10m/ml Dexamethasone;Gait training;Stair training;Functional mobility training;Therapeutic activities;Therapeutic exercise;Balance training;Neuromuscular re-education;Manual techniques;Patient/family education    PT Next Visit Plan Trunk and LE strengthening, balance training. [facility]   PT Home Exercise Plan Initiated    Consulted and Agree with Plan of Care Patient             Patient will benefit from skilled therapeutic intervention in order to improve the following deficits and impairments:  Abnormal gait, Decreased coordination, Difficulty walking, Cardiopulmonary status limiting activity, Decreased endurance, Decreased activity tolerance, Decreased balance, Decreased mobility, Decreased strength, Impaired flexibility, Improper body mechanics  Visit Diagnosis: Muscle weakness (generalized)  Unsteadiness on feet  Difficulty in walking, not elsewhere classified  Other abnormalities of gait and mobility     Problem List Patient Active Problem List   Diagnosis Date Noted   Anemia of chronic disease 11/07/2020   Recurrent falls 11/07/2020   Pseudophakia of both eyes 10/16/2020   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 ([place] 10/16/2020   Macular pucker, right eye 10/16/2020   Mild nonproliferative diabetic retinopathy of left eye ([place] 10/16/2020   Anxiety state 01/04/2020   Benign prostatic hyperplasia without lower urinary tract symptoms 01/04/2020   Chronic diastolic heart failure ([place] 01/04/2020   Diabetic renal disease ([facility] 01/04/2020   Diabetic retinopathy ([facility] 01/04/2020   ED (erectile dysfunction) of organic origin 01/04/2020   Hypertrophic cardiomyopathy ([facility] 01/04/2020   Insomnia 01/04/2020   Left bundle branch block 01/04/2020   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   Primary localized  osteoarthritis of pelvic region and thigh 01/04/2020   Stage 3 chronic kidney disease ([facility] 01/04/2020   Vitamin B12 deficiency (non anemic) 01/04/2020   Recurrent major depression (HParis 01/04/2020   Severe episode of recurrent major depressive disorder, without psychotic features ([facility] 10/05/2018   Cognitive complaints with normal neuropsychological exam 03/11/2018   Referred otalgia of left ear 09/23/2016   Temporomandibular joint (TMJ) pain 09/23/2016   Cervicogenic headache 09/03/2016   Obstructive sleep apnea syndrome 11/15/2015   Memory difficulties 10/05/2014   Abnormality of gait 10/05/2014   Intermittent explosive disorder 09/25/2014   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   Encounter for long-term (current) use of other medications 04/28/2013   Vitamin D Deficiency 04/28/2013   Sleep disorder -Epworth OSA scale = 15 06/02/2012   Anal fissure 08/09/2010   Septic arthritis of hip ([place] 04/11/2010   HYPERLIPIDEMIA 01/11/2010   DEPRESSION 01/11/2010   HYPERTENSION 01/11/2010   GERD 01/11/2010   Depression 01/11/2010   Hyperlipidemia 01/11/2010   T2 NIDDM w/Stage 2 CKD 09/21/2008   ANEMIA, IRON DEFICIENCY 09/21/2008   Personal history of colonic polyps 09/21/2008   Anemia, iron deficiency 09/21/2008   KAnn LionsPT, DPT, PN2   Supplemental Physical Therapist [facility]    Pager [phone] Rehab Office [address] [address]Phone: [phone]  Fax:  [phone] Name: Gerald LANTRYMRN: [id number] of Birth: 9Aug 10, 1947

## 2020-11-10 NOTE — Progress Notes (Signed)
  Subjective:  Patient ID: Gerald [name], male    DOB: 06/17/1945,  MRN: [id number]  Gerald [name] presents to clinic today for at risk foot care. Pt has h/o NIDDM with chronic kidney disease and callus(es) b/l feet and painful thick toenails that are difficult to trim. Painful toenails interfere with ambulation. Aggravating factors include wearing enclosed shoe gear. Pain is relieved with periodic professional debridement. Painful calluses are aggravated when weightbearing with and without shoegear. Pain is relieved with periodic professional debridement.  Patient is not required to monitor blood glucose daily.  PCP is [name], DO , and last visit was 11/08/2020.  No Known Allergies  Review of Systems: Negative except as noted in the HPI. Objective:   Constitutional Gerald [name] is a pleasant 75 y.o. African American male, WD, WN in NAD. AAO x 3.   Vascular CFT <3 seconds b/l LE. Faintly palpable pedal pulses b/l. Pedal hair sparse. Lower extremity skin temperature gradient within normal limits. No edema noted b/l LE. No cyanosis or clubbing noted.  Neurologic Normal speech. Oriented to person, place, and time. Protective sensation intact 5/5 intact bilaterally with 10g monofilament b/l. Vibratory sensation intact b/l.  Dermatologic Pedal integument with normal turgor, texture and tone b/l LE. No open wounds b/l. No interdigital macerations b/l. Toenails 1-5 b/l elongated, thickened, discolored with subungual debris. +Tenderness with dorsal palpation of nailplates. Hyperkeratotic lesion(s) noted submet head 1 left foot and submet head 5 right foot.  Orthopedic: Normal muscle strength 5/5 to all lower extremity muscle groups bilaterally. Limb length discrepancy of left lower extremity. Hammertoe deformity noted 2-5 b/l.   Radiographs: None Assessment:   1. Pain due to onychomycosis of toenails of both feet   2. Callus   3. Type 2 diabetes mellitus with stage 3 chronic kidney  disease, without long-term current use of insulin, unspecified whether stage 3a or 3b CKD ([place])    Plan:  Patient was evaluated and treated and all questions answered. Consent given for treatment as described below: -No new findings. No new orders. -Continue diabetic foot care principles: inspect feet daily, monitor glucose as recommended by PCP and/or Endocrinologist, and follow prescribed diet per PCP, Endocrinologist and/or dietician. -Toenails 1-5 b/l were debrided in length and girth with sterile nail nippers and dremel without iatrogenic bleeding.  -Callus(es) submet head 1 left foot and submet head 5 right foot pared utilizing sterile scalpel blade without complication or incident. Total number debrided =2. -Patient/POA to call should there be question/concern in the interim.  Return in about 3 months (around 02/07/2021).  Marzetta Board, DPM

## 2020-11-13 ENCOUNTER — Encounter: Payer: Self-pay | Admitting: Physical Therapy

## 2020-11-13 ENCOUNTER — Other Ambulatory Visit: Payer: Self-pay

## 2020-11-13 ENCOUNTER — Ambulatory Visit: Payer: Medicare Other | Admitting: Physical Therapy

## 2020-11-13 DIAGNOSIS — R2681 Unsteadiness on feet: Secondary | ICD-10-CM

## 2020-11-13 DIAGNOSIS — M6281 Muscle weakness (generalized): Secondary | ICD-10-CM | POA: Diagnosis not present

## 2020-11-13 DIAGNOSIS — R2689 Other abnormalities of gait and mobility: Secondary | ICD-10-CM

## 2020-11-13 DIAGNOSIS — R262 Difficulty in walking, not elsewhere classified: Secondary | ICD-10-CM | POA: Diagnosis not present

## 2020-11-13 NOTE — Therapy (Signed)
[place]. [place], [place], [address] Phone: [phone]   Fax:  [phone]  Physical Therapy Treatment  Patient Details  Name: Gerald [name] MRN: [id number] Date of Birth: 1945-11-16 No data recorded  Encounter Date: 11/13/2020   PT End of Session - 11/13/20 1714     Visit Number 6    Number of Visits 17    Date for PT Re-Evaluation 12/10/20    PT Start Time 1620    PT Stop Time 1659    PT Time Calculation (min) 39 min    Equipment Utilized During Treatment Gait belt    Activity Tolerance Patient tolerated treatment well    Behavior During Therapy [facility] for tasks assessed/performed             Past Medical History:  Diagnosis Date   Abnormality of gait 10/05/2014   Anxiety    Bilateral edema of lower extremity    LE Dopplers 4/22 - no thrombus or thrombophelbitis, no reflux   Colon polyp    DDD (degenerative disc disease)    Depression    Diabetes mellitus without complication ([place])    controlled by diet   DJD (degenerative joint disease)    DOE (dyspnea on exertion)    Echo 04 /22/2014 - normal EF, mild LVH, grade 1 Diastolic Dysfunction; Lexiscan Myoview 04/28/12 - no ishcemia or infarction, LBBB septal wall motion; EF ~60%   Fatigue    GERD (gastroesophageal reflux disease)    H/O iron deficiency anemia    with  hemmorrhoids   Hyperlipidemia    Hypertension    Left bundle branch block     chronic, negative Myoview a normal echo as noted above    Osteoarthritis    with bilateral hip surgeries and multiple complication, with the most recent  surgery in January 2012   Scrotal edema    Type 2 diabetes mellitus without complications ([place]) 54/27/0623   Vitamin D deficiency     Past Surgical History:  Procedure Laterality Date   Cardiology Nuclear Med Study   04 24 2014   overall impression ; NORMAL  STRESS  NUCLEAR STUDY   HIP SURGERY  2004   Venous Duplex  04 22 2014   LOWER EXTREMITY  SWELLING -Impressions [name] ; This is a normal bilateral  lower extremity venous duplex Doppler evaluation    There were no vitals filed for this visit.   Subjective Assessment - 11/13/20 1623     Subjective I felt a little sore after last session like we worked it but it didn't last all weekend. Still working on getting personal quad cane.    Pertinent History DJD, Depression, DDD    Patient Stated Goals Patient would like to get back to the gym, have more confidence, not need a RW.    Currently in Pain? No/denies                               [facility] Adult PT Treatment/Exercise - 11/13/20 0001       Ambulation/Gait   Gait Comments gait training outdoors over more challenging surfaces- more unpredictable ground, steeper hills/more hills; up to Mod VC/MinA for balance to complete course successfully      Knee/Hip Exercises: Standing   Heel Raises Both;1 set;15 reps    Heel Raises Limitations limited by time left in session; focus on slow movement and muscle control  PT Education - 11/13/20 1712     Education Details safety considerations/general environment awarness and scanning what's coming up in environment- sometimes its better to take longer but smoother and safer path instead of risks in order to prevent a fall; role of strength and endurance in preventing falls    Person(s) Educated Patient    Methods Explanation    Comprehension Verbalized understanding              PT Short Term Goals - 11/01/20 1655       PT SHORT TERM GOAL #1   Title Patient will perform basic HEP with MI, using written instructions.    Status Partially Met               PT Long Term Goals - 10/15/20 1647       PT LONG TERM GOAL #1   Title Improve L hip strenght to 3/5    Baseline 2+-(3-)/5    Time 8    Period Weeks    Status New    Target Date 12/10/20      PT LONG TERM GOAL #2   Title Patient will perform advanced HEP MI  with written instructions    Baseline Initiated    Time 8    Period Weeks    Status New    Target Date 12/10/20      PT LONG TERM GOAL #3   Title ambualte x at least 500' with LRAD, MI, including uneven surfaces.    Baseline Unsteady with cane, 50', CGA.    Time 8    Period Weeks    Status New    Target Date 12/10/20      PT LONG TERM GOAL #4   Title Up and down 4 steps using U rail, with step to gait pattern, S    Baseline CGA, unsteady    Time 8    Period Weeks    Status New    Target Date 12/10/20      PT LONG TERM GOAL #5   Title Patient will demosntrate improved weight hsift onto LLE in gait iwth longer step length and equal stance time through each LE.    Baseline Step to gait with decreased WB through LLE.    Time 8    Period Weeks    Status New    Target Date 12/10/20                   Plan - 11/13/20 1714     Clinical Impression Statement Mr. Cardoza arrives today reporting he had a good workout last time. Continued focus of gait training outside for improved strength, balance and endurance with SQBC. Continues to have great carryover of sequencing with this device and much more steady. Chose much more challenging route involving more uneven surfaces including steeper hills/more unpredictable environment- did need up to Mod cues for safety and MinA for balance with increased challenge. He rated outdoor activities as being 9/10 on difficulty scale today.  Definitely has weak ankle musculature (especially dorsiflexors) which may be contributing to unsteadiness/difficulty recovering balance when challenged. Discussed general safety and decision making in challenging environments, benefit of taking his time and choosing potentially longer but safer/less risky path when it comes to preventing falls in the community. Tells me he tends to fall a lot when turning to look over his shoulder- assessed this today, did not appear very unsteady either direction, we are in  agreement his falling may be  more from rushing than physical act of looking over shoulder. Also worked on some ankle strengthening today. Will benefit from strength focus next session potentially to include floor to stand training due to his reports of frequent falls.    Personal Factors and Comorbidities Age;Comorbidity 3+    Comorbidities leg length discrepancy, arthritis, DDD, L BBB    Examination-Activity Limitations Bathing;Locomotion Level;Transfers;Bend;Sleep;Squat;Stairs;Stand;Lift    Stability/Clinical Decision Making Stable/Uncomplicated    Clinical Decision Making Moderate    Rehab Potential Good    PT Frequency 2x / week    PT Duration 8 weeks    PT Treatment/Interventions ADLs/Self Care Home Management;Electrical Stimulation;DME Instruction;Moist Heat;Iontophoresis 60m/ml Dexamethasone;Gait training;Stair training;Functional mobility training;Therapeutic activities;Therapeutic exercise;Balance training;Neuromuscular re-education;Manual techniques;Patient/family education    PT Next Visit Plan Trunk and LE strengthening, balance training. continue gait training with SBQC. Floor to stand training. Update HEP.    PT Home Exercise Plan Initiated    Consulted and Agree with Plan of Care Patient             Patient will benefit from skilled therapeutic intervention in order to improve the following deficits and impairments:  Abnormal gait, Decreased coordination, Difficulty walking, Cardiopulmonary status limiting activity, Decreased endurance, Decreased activity tolerance, Decreased balance, Decreased mobility, Decreased strength, Impaired flexibility, Improper body mechanics  Visit Diagnosis: Unsteadiness on feet  Muscle weakness (generalized)  Difficulty in walking, not elsewhere classified  Other abnormalities of gait and mobility     Problem List Patient Active Problem List   Diagnosis Date Noted   Anemia of chronic disease 11/07/2020   Recurrent falls 11/07/2020    Pseudophakia of both eyes 10/16/2020   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 ([facility] 10/16/2020   Macular pucker, right eye 10/16/2020   Mild nonproliferative diabetic retinopathy of left eye (HCarl[place] 10/16/2020   Anxiety state 01/04/2020   Benign prostatic hyperplasia without lower urinary tract symptoms 01/04/2020   Chronic diastolic heart failure (HEpworth 01/04/2020   Diabetic renal disease (HT[place] 01/04/2020   Diabetic retinopathy (HS[place] 01/04/2020   ED (erectile dysfunction) of organic origin 01/04/2020   Hypertrophic cardiomyopathy (HPle[place] 01/04/2020   Insomnia 01/04/2020   Left bundle branch block 01/04/2020   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   Primary localized osteoarthritis of pelvic region and thigh 01/04/2020   Stage 3 chronic kidney disease (HBr[place] 01/04/2020   Vitamin B12 deficiency (non anemic) 01/04/2020   Recurrent major depression ([facility] 01/04/2020   Severe episode of recurrent major depressive disorder, without psychotic features ([facility] 10/05/2018   Cognitive complaints with normal neuropsychological exam 03/11/2018   Referred otalgia of left ear 09/23/2016   Temporomandibular joint (TMJ) pain 09/23/2016   Cervicogenic headache 09/03/2016   Obstructive sleep apnea syndrome 11/15/2015   Memory difficulties 10/05/2014   Abnormality of gait 10/05/2014   Intermittent explosive disorder 09/25/2014   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   Encounter for long-term (current) use of other medications 04/28/2013   Vitamin D Deficiency 04/28/2013   Sleep disorder -Epworth OSA scale = 15 06/02/2012   Anal fissure 08/09/2010   Septic arthritis of hip (HH[place] 04/11/2010   HYPERLIPIDEMIA 01/11/2010   DEPRESSION 01/11/2010   HYPERTENSION 01/11/2010   GERD 01/11/2010   Depression 01/11/2010   Hyperlipidemia 01/11/2010   T2 NIDDM w/Stage 2 CKD 09/21/2008   ANEMIA, IRON  DEFICIENCY 09/21/2008   Personal history of colonic polyps 09/21/2008   Anemia, iron deficiency 09/21/2008   KAnn LionsPT,  DPT, PN2   Supplemental Physical Therapist [facility]    Pager [phone] Acute Rehab Office [place]. [place], [place], [address] Phone: [phone]   Fax:  [phone]  Name: Gerald [name] MRN: [id number] Date of Birth: 11-05-1945

## 2020-11-15 ENCOUNTER — Ambulatory Visit: Payer: Medicare Other | Admitting: Physical Therapy

## 2020-11-15 ENCOUNTER — Other Ambulatory Visit: Payer: Self-pay

## 2020-11-15 ENCOUNTER — Encounter: Payer: Self-pay | Admitting: Physical Therapy

## 2020-11-15 DIAGNOSIS — M6281 Muscle weakness (generalized): Secondary | ICD-10-CM

## 2020-11-15 DIAGNOSIS — R2681 Unsteadiness on feet: Secondary | ICD-10-CM | POA: Diagnosis not present

## 2020-11-15 DIAGNOSIS — R2689 Other abnormalities of gait and mobility: Secondary | ICD-10-CM | POA: Diagnosis not present

## 2020-11-15 DIAGNOSIS — R262 Difficulty in walking, not elsewhere classified: Secondary | ICD-10-CM

## 2020-11-15 NOTE — Therapy (Signed)
[address] [place], [place], [address] Phone: [phone]   Fax:  [phone]  Physical Therapy Treatment  Patient Details  Name: Gerald [name] MRN: [id number] Date of Birth: 12-06-1945 No data recorded  Encounter Date: 11/15/2020   PT End of Session - 11/15/20 1700     Visit Number 7    Number of Visits 17    Date for PT Re-Evaluation 12/10/20    PT Start Time 1631    PT Stop Time 1700    PT Time Calculation (min) 29 min    Activity Tolerance Patient tolerated treatment well    Behavior During Therapy [facility] for tasks assessed/performed             Past Medical History:  Diagnosis Date   Abnormality of gait 10/05/2014   Anxiety    Bilateral edema of lower extremity    LE Dopplers 4/22 - no thrombus or thrombophelbitis, no reflux   Colon polyp    DDD (degenerative disc disease)    Depression    Diabetes mellitus without complication ([place])    controlled by diet   DJD (degenerative joint disease)    DOE (dyspnea on exertion)    Echo 04 /22/2014 - normal EF, mild LVH, grade 1 Diastolic Dysfunction; Lexiscan Myoview 04/28/12 - no ishcemia or infarction, LBBB septal wall motion; EF ~60%   Fatigue    GERD (gastroesophageal reflux disease)    H/O iron deficiency anemia    with  hemmorrhoids   Hyperlipidemia    Hypertension    Left bundle branch block     chronic, negative Myoview a normal echo as noted above    Osteoarthritis    with bilateral hip surgeries and multiple complication, with the most recent  surgery in January 2012   Scrotal edema    Type 2 diabetes mellitus without complications ([place]) 11/04/1312   Vitamin D deficiency     Past Surgical History:  Procedure Laterality Date   Cardiology Nuclear Med Study   04 24 2014   overall impression ; NORMAL  STRESS  NUCLEAR STUDY   HIP SURGERY  2004   Venous Duplex  04 22 2014   LOWER EXTREMITY SWELLING -Impressions [name] ; This is a normal  bilateral  lower extremity venous duplex Doppler evaluation    There were no vitals filed for this visit.   Subjective Assessment - 11/15/20 1635     Subjective I apologize for being bit late today. Felt fine after last session, I didn't do my HEP like I planned to at home tho.    Pertinent History DJD, Depression, DDD    Patient Stated Goals Patient would like to get back to the gym, have more confidence, not need a RW.    Currently in Pain? No/denies                               [facility] Adult PT Treatment/Exercise - 11/15/20 0001       Knee/Hip Exercises: Seated   Other Seated Knee/Hip Exercises ankle DF, inversion, eversion red TB 1x15 B      Knee/Hip Exercises: Supine   Bridges Both;1 set;15 reps    Bridges with Clamshell Both;1 set;15 reps   red TB 3 second holds   Other Supine Knee/Hip Exercises supine clams red TB 1x15 with 3 second holds  PT Education - 11/15/20 1700     Education Details exercise form, HEP updated next session    Person(s) Educated Patient    Methods Explanation    Comprehension Verbalized understanding              PT Short Term Goals - 11/01/20 1655       PT SHORT TERM GOAL #1   Title Patient will perform basic HEP with MI, using written instructions.    Status Partially Met               PT Long Term Goals - 10/15/20 1647       PT LONG TERM GOAL #1   Title Improve L hip strenght to 3/5    Baseline 2+-(3-)/5    Time 8    Period Weeks    Status New    Target Date 12/10/20      PT LONG TERM GOAL #2   Title Patient will perform advanced HEP MI with written instructions    Baseline Initiated    Time 8    Period Weeks    Status New    Target Date 12/10/20      PT LONG TERM GOAL #3   Title ambualte x at least 500' with LRAD, MI, including uneven surfaces.    Baseline Unsteady with cane, 50', CGA.    Time 8    Period Weeks    Status New    Target Date 12/10/20      PT  LONG TERM GOAL #4   Title Up and down 4 steps using U rail, with step to gait pattern, S    Baseline CGA, unsteady    Time 8    Period Weeks    Status New    Target Date 12/10/20      PT LONG TERM GOAL #5   Title Patient will demosntrate improved weight hsift onto LLE in gait iwth longer step length and equal stance time through each LE.    Baseline Step to gait with decreased WB through LLE.    Time 8    Period Weeks    Status New    Target Date 12/10/20                   Plan - 11/15/20 1705     Clinical Impression Statement Mr. Kohrs arrives a bit late today- spent session focusing on functional strength for proximal musculature and ankles in an attempt to strengthen musculature primarily involved in balance recovery skills/ability. Did not have time to attempt floor to stand transfers today, might be a great activity for next session though. Continue and progress PT POC as tolerated. Need to encourage increased compliance with HEP at home moving forward.    Personal Factors and Comorbidities Age;Comorbidity 3+    Comorbidities leg length discrepancy, arthritis, DDD, L BBB    Examination-Activity Limitations Bathing;Locomotion Level;Transfers;Bend;Sleep;Squat;Stairs;Stand;Lift    Stability/Clinical Decision Making Stable/Uncomplicated    Clinical Decision Making Moderate    Rehab Potential Good    PT Frequency 2x / week    PT Duration 8 weeks    PT Treatment/Interventions ADLs/Self Care Home Management;Electrical Stimulation;DME Instruction;Moist Heat;Iontophoresis 13m/ml Dexamethasone;Gait training;Stair training;Functional mobility training;Therapeutic activities;Therapeutic exercise;Balance training;Neuromuscular re-education;Manual techniques;Patient/family education    PT Next Visit Plan Trunk and LE strengthening, balance training. continue gait training with SBQC. Floor to stand training. Update HEP.    PT Home Exercise Plan Initiated    Consulted and Agree with  Plan of  Care Patient             Patient will benefit from skilled therapeutic intervention in order to improve the following deficits and impairments:  Abnormal gait, Decreased coordination, Difficulty walking, Cardiopulmonary status limiting activity, Decreased endurance, Decreased activity tolerance, Decreased balance, Decreased mobility, Decreased strength, Impaired flexibility, Improper body mechanics  Visit Diagnosis: Unsteadiness on feet  Muscle weakness (generalized)  Difficulty in walking, not elsewhere classified  Other abnormalities of gait and mobility     Problem List Patient Active Problem List   Diagnosis Date Noted   Anemia of chronic disease 11/07/2020   Recurrent falls 11/07/2020   Pseudophakia of both eyes 10/16/2020   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) 01/04/2020   Insomnia 01/04/2020   Left bundle branch block 01/04/2020   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   Primary localized osteoarthritis of pelvic region and thigh 01/04/2020   Stage 3 chronic kidney disease ([place]) 01/04/2020   Vitamin B12 deficiency (non anemic) 01/04/2020   Recurrent major depression ([place]) 01/04/2020   Severe episode of recurrent major depressive disorder, without psychotic features ([place]) 10/05/2018   Cognitive complaints with normal neuropsychological exam 03/11/2018   Referred otalgia of left ear 09/23/2016   Temporomandibular joint (TMJ) pain  09/23/2016   Cervicogenic headache 09/03/2016   Obstructive sleep apnea syndrome 11/15/2015   Memory difficulties 10/05/2014   Abnormality of gait 10/05/2014   Intermittent explosive disorder 09/25/2014   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   Encounter for long-term (current) use of other medications 04/28/2013   Vitamin D Deficiency 04/28/2013   Sleep disorder -Epworth OSA scale = 15 06/02/2012   Anal fissure 08/09/2010   Septic arthritis of hip ([place]) 04/11/2010   HYPERLIPIDEMIA 01/11/2010   DEPRESSION 01/11/2010   HYPERTENSION 01/11/2010   GERD 01/11/2010   Depression 01/11/2010   Hyperlipidemia 01/11/2010   T2 NIDDM w/Stage 2 CKD 09/21/2008   ANEMIA, IRON DEFICIENCY 09/21/2008   Personal history of colonic polyps 09/21/2008   Anemia, iron deficiency 09/21/2008   Ann Lions PT, DPT, PN2   Supplemental Physical Therapist [facility]    Pager [phone] Acute Rehab Office [address]. [place], [place], [address] Phone: [phone]   Fax:  [phone]  Name: Gerald [name] MRN: [id number] Date of Birth: Jul 20, 1945

## 2020-11-20 ENCOUNTER — Other Ambulatory Visit: Payer: Self-pay

## 2020-11-20 ENCOUNTER — Encounter: Payer: Self-pay | Admitting: Physical Therapy

## 2020-11-20 ENCOUNTER — Ambulatory Visit: Payer: Medicare Other | Admitting: Physical Therapy

## 2020-11-20 DIAGNOSIS — R2689 Other abnormalities of gait and mobility: Secondary | ICD-10-CM | POA: Diagnosis not present

## 2020-11-20 DIAGNOSIS — M6281 Muscle weakness (generalized): Secondary | ICD-10-CM

## 2020-11-20 DIAGNOSIS — R262 Difficulty in walking, not elsewhere classified: Secondary | ICD-10-CM

## 2020-11-20 DIAGNOSIS — R2681 Unsteadiness on feet: Secondary | ICD-10-CM

## 2020-11-20 NOTE — Therapy (Signed)
[address] [place], [place], [address] Phone: [phone]   Fax:  [phone]  Physical Therapy Treatment  Patient Details  Name: Gerald [name] MRN: [id number] Date of Birth: Oct 06, 1945 No data recorded  Encounter Date: 11/20/2020   PT End of Session - 11/20/20 1707     Visit Number 8    Number of Visits 17    Date for PT Re-Evaluation 12/10/20    PT Start Time 1635   arrived late   PT Stop Time 1658    PT Time Calculation (min) 23 min    Activity Tolerance Patient tolerated treatment well    Behavior During Therapy [facility] for tasks assessed/performed             Past Medical History:  Diagnosis Date   Abnormality of gait 10/05/2014   Anxiety    Bilateral edema of lower extremity    LE Dopplers 4/22 - no thrombus or thrombophelbitis, no reflux   Colon polyp    DDD (degenerative disc disease)    Depression    Diabetes mellitus without complication ([place])    controlled by diet   DJD (degenerative joint disease)    DOE (dyspnea on exertion)    Echo 04 /22/2014 - normal EF, mild LVH, grade 1 Diastolic Dysfunction; Lexiscan Myoview 04/28/12 - no ishcemia or infarction, LBBB septal wall motion; EF ~60%   Fatigue    GERD (gastroesophageal reflux disease)    H/O iron deficiency anemia    with  hemmorrhoids   Hyperlipidemia    Hypertension    Left bundle branch block     chronic, negative Myoview a normal echo as noted above    Osteoarthritis    with bilateral hip surgeries and multiple complication, with the most recent  surgery in January 2012   Scrotal edema    Type 2 diabetes mellitus without complications ([place]) 85/90/9311   Vitamin D deficiency     Past Surgical History:  Procedure Laterality Date   Cardiology Nuclear Med Study   04 24 2014   overall impression ; NORMAL  STRESS  NUCLEAR STUDY   HIP SURGERY  2004   Venous Duplex  04 22 2014   LOWER EXTREMITY SWELLING -Impressions [name] ;  This is a normal bilateral  lower extremity venous duplex Doppler evaluation    There were no vitals filed for this visit.   Subjective Assessment - 11/20/20 1636     Subjective I apologize for being late today- I don't like being late. I overslept and that's why I was late.  I was sore all weekend after our last workout.    Pertinent History DJD, Depression, DDD    Patient Stated Goals Patient would like to get back to the gym, have more confidence, not need a RW.    Currently in Pain? No/denies                               [facility] Adult PT Treatment/Exercise - 11/20/20 0001       Therapeutic Activites    Therapeutic Activities Other Therapeutic Activities    Other Therapeutic Activities floor to stand with min guard, Min-Mod cues for sequencing and safety      Knee/Hip Exercises: Standing   Forward Step Up Both;1 set;15 reps;Hand Hold: 2  PT Education - 11/20/20 1707     Education Details importance of getting to PT sessions on time for full benefit; currently recommend he does continue with therapy    Person(s) Educated Patient    Methods Explanation    Comprehension Verbalized understanding              PT Short Term Goals - 11/01/20 1655       PT SHORT TERM GOAL #1   Title Patient will perform basic HEP with MI, using written instructions.    Status Partially Met               PT Long Term Goals - 10/15/20 1647       PT LONG TERM GOAL #1   Title Improve L hip strenght to 3/5    Baseline 2+-(3-)/5    Time 8    Period Weeks    Status New    Target Date 12/10/20      PT LONG TERM GOAL #2   Title Patient will perform advanced HEP MI with written instructions    Baseline Initiated    Time 8    Period Weeks    Status New    Target Date 12/10/20      PT LONG TERM GOAL #3   Title ambualte x at least 500' with LRAD, MI, including uneven surfaces.    Baseline Unsteady with cane, 50', CGA.    Time 8     Period Weeks    Status New    Target Date 12/10/20      PT LONG TERM GOAL #4   Title Up and down 4 steps using U rail, with step to gait pattern, S    Baseline CGA, unsteady    Time 8    Period Weeks    Status New    Target Date 12/10/20      PT LONG TERM GOAL #5   Title Patient will demosntrate improved weight hsift onto LLE in gait iwth longer step length and equal stance time through each LE.    Baseline Step to gait with decreased WB through LLE.    Time 8    Period Weeks    Status New    Target Date 12/10/20                   Plan - 11/20/20 1708     Clinical Impression Statement Gerald [name] arrives late today- discussed importance of arriving to therapy on time to get full effect of sessions; we are happy to continue to see him, but he will get much more benefit from attending full sessions. Discussed sequencing/safety considerations and practiced floor to stand today- able to physically perform well, did need education on checking for injury and about situations in which he should call 911 before trying to move around. Otherwise worked on functional strength as able/time allowed. Would really benefit from continuation of PT- we just need to work with him to find session times he can attend more easily.    Personal Factors and Comorbidities Age;Comorbidity 3+    Comorbidities leg length discrepancy, arthritis, DDD, L BBB    Examination-Activity Limitations Bathing;Locomotion Level;Transfers;Bend;Sleep;Squat;Stairs;Stand;Lift    Stability/Clinical Decision Making Stable/Uncomplicated    Clinical Decision Making Moderate    Rehab Potential Good    PT Frequency 2x / week    PT Duration 8 weeks    PT Treatment/Interventions ADLs/Self Care Home Management;Electrical Stimulation;DME Instruction;Moist Heat;Iontophoresis 4mg /ml Dexamethasone;Gait training;Stair training;Functional mobility training;Therapeutic  activities;Therapeutic exercise;Balance training;Neuromuscular  re-education;Manual techniques;Patient/family education    PT Next Visit Plan needs to schedule more appointments- Trunk and LE strengthening, balance training. continue gait training with SBQC. Update HEP.    PT Home Exercise Plan Initiated    Consulted and Agree with Plan of Care Patient             Patient will benefit from skilled therapeutic intervention in order to improve the following deficits and impairments:  Abnormal gait, Decreased coordination, Difficulty walking, Cardiopulmonary status limiting activity, Decreased endurance, Decreased activity tolerance, Decreased balance, Decreased mobility, Decreased strength, Impaired flexibility, Improper body mechanics  Visit Diagnosis: Unsteadiness on feet  Muscle weakness (generalized)  Difficulty in walking, not elsewhere classified  Other abnormalities of gait and mobility     Problem List Patient Active Problem List   Diagnosis Date Noted   Anemia of chronic disease 11/07/2020   Recurrent falls 11/07/2020   Pseudophakia of both eyes 10/16/2020   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 ([place]) 10/16/2020   Macular pucker, right eye 10/16/2020   Mild nonproliferative diabetic retinopathy of left eye ([place]) 10/16/2020   Anxiety state 01/04/2020   Benign prostatic hyperplasia without lower urinary tract symptoms 01/04/2020   Chronic diastolic heart failure ([place]) 01/04/2020   Diabetic renal disease ([place]) 01/04/2020   Diabetic retinopathy ([place]) 01/04/2020   ED (erectile dysfunction) of organic origin 01/04/2020   Hypertrophic cardiomyopathy ([place]) 01/04/2020   Insomnia 01/04/2020   Left bundle branch block 01/04/2020   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   Primary localized osteoarthritis of pelvic region and thigh 01/04/2020   Stage 3 chronic kidney disease ([place]) 01/04/2020   Vitamin B12 deficiency (non anemic) 01/04/2020   Recurrent major depression  ([place]) 01/04/2020   Severe episode of recurrent major depressive disorder, without psychotic features ([place]) 10/05/2018   Cognitive complaints with normal neuropsychological exam 03/11/2018   Referred otalgia of left ear 09/23/2016   Temporomandibular joint (TMJ) pain 09/23/2016   Cervicogenic headache 09/03/2016   Obstructive sleep apnea syndrome 11/15/2015   Memory difficulties 10/05/2014   Abnormality of gait 10/05/2014   Intermittent explosive disorder 09/25/2014   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   Encounter for long-term (current) use of other medications 04/28/2013   Vitamin D Deficiency 04/28/2013   Sleep disorder -Epworth OSA scale = 15 06/02/2012   Anal fissure 08/09/2010   Septic arthritis of hip ([place]) 04/11/2010   HYPERLIPIDEMIA 01/11/2010   DEPRESSION 01/11/2010   HYPERTENSION 01/11/2010   GERD 01/11/2010   Depression 01/11/2010   Hyperlipidemia 01/11/2010   T2 NIDDM w/Stage 2 CKD 09/21/2008   ANEMIA, IRON DEFICIENCY 09/21/2008   Personal history of colonic polyps 09/21/2008   Anemia, iron deficiency 09/21/2008   Ann Lions PT, DPT, PN2   Supplemental Physical Therapist [facility]    Pager [phone] Acute Rehab Office [place]. [place], [place], [address] Phone: [phone]   Fax:  [phone]  Name: Gerald [name] MRN: [id number] Date of Birth: [birth year]

## 2020-11-21 ENCOUNTER — Telehealth: Payer: Self-pay | Admitting: Neurology

## 2020-11-21 NOTE — Telephone Encounter (Signed)
Confirmed with the patient that Dr. April Manson did place PT orders on 10/01/20. She appreciated the call.

## 2020-11-21 NOTE — Telephone Encounter (Signed)
Pt's wife states pt's last date for PT is tomorrow.  Wife wants to know if Dr April Manson is sending an order for a continuance of the PT since he thought pt was benefiting from it, please call to confirm.

## 2020-11-22 ENCOUNTER — Ambulatory Visit: Payer: Medicare Other | Admitting: Physical Therapy

## 2020-11-26 ENCOUNTER — Ambulatory Visit: Payer: Medicare Other | Admitting: Physical Therapy

## 2020-11-26 ENCOUNTER — Other Ambulatory Visit: Payer: Self-pay

## 2020-11-26 ENCOUNTER — Encounter: Payer: Self-pay | Admitting: Physical Therapy

## 2020-11-26 DIAGNOSIS — R2681 Unsteadiness on feet: Secondary | ICD-10-CM

## 2020-11-26 DIAGNOSIS — M6281 Muscle weakness (generalized): Secondary | ICD-10-CM

## 2020-11-26 DIAGNOSIS — R2689 Other abnormalities of gait and mobility: Secondary | ICD-10-CM

## 2020-11-26 DIAGNOSIS — R262 Difficulty in walking, not elsewhere classified: Secondary | ICD-10-CM

## 2020-11-26 NOTE — Therapy (Signed)
[place]. [place], [place], [address] Phone: [phone]   Fax:  [phone]  Physical Therapy Treatment  Patient Details  Name: Gerald [name] MRN: [id number] Date of Birth: Mar 18, 1945 No data recorded  Encounter Date: 11/26/2020   PT End of Session - 11/26/20 1713     Visit Number [id number]    Number of Visits [phone]    Date for PT Re-Evaluation 12/10/20    PT Start Time 1632    PT Stop Time 1713    PT Time Calculation (min) 41 min    Activity Tolerance Patient tolerated treatment well    Behavior During Therapy [facility] for tasks assessed/performed             Past Medical History:  Diagnosis Date   Abnormality of gait 10/05/2014   Anxiety    Bilateral edema of lower extremity    LE Dopplers 4/22 - no thrombus or thrombophelbitis, no reflux   Colon polyp    DDD (degenerative disc disease)    Depression    Diabetes mellitus without complication ([place])    controlled by diet   DJD (degenerative joint disease)    DOE (dyspnea on exertion)    Echo 04 /22/2014 - normal EF, mild LVH, grade 1 Diastolic Dysfunction; Lexiscan Myoview 04/28/12 - no ishcemia or infarction, LBBB septal wall motion; EF ~60%   Fatigue    GERD (gastroesophageal reflux disease)    H/O iron deficiency anemia    with  hemmorrhoids   Hyperlipidemia    Hypertension    Left bundle branch block     chronic, negative Myoview a normal echo as noted above    Osteoarthritis    with bilateral hip surgeries and multiple complication, with the most recent  surgery in January 2012   Scrotal edema    Type 2 diabetes mellitus without complications ([place]) 37/62/8315   Vitamin D deficiency     Past Surgical History:  Procedure Laterality Date   Cardiology Nuclear Med Study   04 24 2014   overall impression ; NORMAL  STRESS  NUCLEAR STUDY   HIP SURGERY  2004   Venous Duplex  04 22 2014   LOWER EXTREMITY SWELLING -Impressions [name] ; This is a  normal bilateral  lower extremity venous duplex Doppler evaluation    There were no vitals filed for this visit.   Subjective Assessment - 11/26/20 1651     Subjective Patient reports he has not been performing HEP due to being lazy.    Pertinent History DJD, Depression, DDD    Patient Stated Goals Patient would like to get back to the gym, have more confidence, not need a RW.    Currently in Pain? No/denies                               [facility] Adult PT Treatment/Exercise - 11/26/20 0001       Knee/Hip Exercises: Aerobic   Nustep L3 x 4 minutes legs only      Knee/Hip Exercises: Standing   Other Standing Knee Exercises Also performed standing rows, shoulder ext for trunk stability. 1 x 10 each      Knee/Hip Exercises: Seated   Long Arc Quad Strengthening;Both;1 set;10 reps    Long Arc Quad Limitations Red T band    [facility] 10 reps x 5 sec hold    Clamshell with TheraBand Red  Other Seated Knee/Hip Exercises knee flexion against red T band.    Hamstring Curl Strengthening;Both;1 set;10 reps    Hamstring Limitations red tband                     PT Education - 11/26/20 1712     Education Details Updated HEP, importance of performing HEP daily and good form.    Person(s) Educated Patient    Methods Explanation;Demonstration;Handout    Comprehension Verbalized understanding;Returned demonstration              PT Short Term Goals - 11/26/20 1717       PT SHORT TERM GOAL #1   Title Patient will perform basic HEP with MI, using written instructions.    Baseline HEP updated    Time 2    Period Weeks    Status On-going               PT Long Term Goals - 10/15/20 1647       PT LONG TERM GOAL #1   Title Improve L hip strenght to 3/5    Baseline 2+-(3-)/5    Time 8    Period Weeks    Status New    Target Date 12/10/20      PT LONG TERM GOAL #2   Title Patient will perform advanced HEP MI with written instructions     Baseline Initiated    Time 8    Period Weeks    Status New    Target Date 12/10/20      PT LONG TERM GOAL #3   Title ambualte x at least 500' with LRAD, MI, including uneven surfaces.    Baseline Unsteady with cane, 50', CGA.    Time 8    Period Weeks    Status New    Target Date 12/10/20      PT LONG TERM GOAL #4   Title Up and down 4 steps using U rail, with step to gait pattern, S    Baseline CGA, unsteady    Time 8    Period Weeks    Status New    Target Date 12/10/20      PT LONG TERM GOAL #5   Title Patient will demosntrate improved weight hsift onto LLE in gait iwth longer step length and equal stance time through each LE.    Baseline Step to gait with decreased WB through LLE.    Time 8    Period Weeks    Status New    Target Date 12/10/20                   Plan - 11/26/20 1714     Clinical Impression Statement Patient acknowledged he has not been performing HEp, states he has ben lazy. Therapsit encouraged performance, educating him to the importance of daily strenghtening to be effective. Updated HEP to more closely resemble the program he used to perform at the gym in hope sof increasing his consistency of performance.    Personal Factors and Comorbidities Age;Comorbidity 3+    Comorbidities leg length discrepancy, arthritis, DDD, L BBB    Examination-Activity Limitations Bathing;Locomotion Level;Transfers;Bend;Sleep;Squat;Stairs;Stand;Lift    Stability/Clinical Decision Making Stable/Uncomplicated    Clinical Decision Making Low    Rehab Potential Good    PT Frequency 2x / week    PT Duration Other (comment)   7w   PT Treatment/Interventions ADLs/Self Care Home Management;Electrical Stimulation;DME Instruction;Moist Heat;Iontophoresis 4mg /ml Dexamethasone;Gait training;Stair training;Functional mobility  training;Therapeutic activities;Therapeutic exercise;Balance training;Neuromuscular re-education;Manual techniques;Patient/family education    PT Next  Visit Plan F/U with HEP    PT Home Exercise Plan Initiated    Consulted and Agree with Plan of Care Patient             Patient will benefit from skilled therapeutic intervention in order to improve the following deficits and impairments:  Abnormal gait, Decreased coordination, Difficulty walking, Cardiopulmonary status limiting activity, Decreased endurance, Decreased activity tolerance, Decreased balance, Decreased mobility, Decreased strength, Impaired flexibility, Improper body mechanics  Visit Diagnosis: Unsteadiness on feet  Muscle weakness (generalized)  Difficulty in walking, not elsewhere classified  Other abnormalities of gait and mobility     Problem List Patient Active Problem List   Diagnosis Date Noted   Anemia of chronic disease 11/07/2020   Recurrent falls 11/07/2020   Pseudophakia of both eyes 10/16/2020   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 ([place]) 10/16/2020   Macular pucker, right eye 10/16/2020   Mild nonproliferative diabetic retinopathy of left eye ([place]) 10/16/2020   Anxiety state 01/04/2020   Benign prostatic hyperplasia without lower urinary tract symptoms 01/04/2020   Chronic diastolic heart failure ([place]) 01/04/2020   Diabetic renal disease ([place]) 01/04/2020   Diabetic retinopathy ([place]) 01/04/2020   ED (erectile dysfunction) of organic origin 01/04/2020   Hypertrophic cardiomyopathy ([place]) 01/04/2020   Insomnia 01/04/2020   Left bundle branch block 01/04/2020   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   Primary localized osteoarthritis of pelvic region and thigh 01/04/2020   Stage 3 chronic kidney disease ([place]) 01/04/2020   Vitamin B12 deficiency (non anemic) 01/04/2020   Recurrent major depression ([place]) 01/04/2020   Severe episode of recurrent major depressive disorder, without psychotic features ([place]) 10/05/2018   Cognitive complaints with normal neuropsychological exam  03/11/2018   Referred otalgia of left ear 09/23/2016   Temporomandibular joint (TMJ) pain 09/23/2016   Cervicogenic headache 09/03/2016   Obstructive sleep apnea syndrome 11/15/2015   Memory difficulties 10/05/2014   Abnormality of gait 10/05/2014   Intermittent explosive disorder 09/25/2014   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   Encounter for long-term (current) use of other medications 04/28/2013   Vitamin D Deficiency 04/28/2013   Sleep disorder -Epworth OSA scale = 15 06/02/2012   Anal fissure 08/09/2010   Septic arthritis of hip ([place]) 04/11/2010   HYPERLIPIDEMIA 01/11/2010   DEPRESSION 01/11/2010   HYPERTENSION 01/11/2010   GERD 01/11/2010   Depression 01/11/2010   Hyperlipidemia 01/11/2010   T2 NIDDM w/Stage 2 CKD 09/21/2008   ANEMIA, IRON DEFICIENCY 09/21/2008   Personal history of colonic polyps 09/21/2008   Anemia, iron deficiency 09/21/2008    Marcelina Morel, DPT 11/26/2020, 5:18 PM  [place]. [place], [place], [address] Phone: [phone]   Fax:  [phone]  Name: SHAMAN MUSCARELLA MRN: [id number] Date of Birth: 11-Nov-1945

## 2020-11-26 NOTE — Patient Instructions (Signed)
Access Code: [email or web address] URL: [email or web address] Date: 11/26/2020 Prepared by: Ethel Rana  Exercises Seated Hip Abduction with Resistance - 1 x daily - 7 x weekly - 2 sets - 10 reps Seated Hip Adduction Squeeze with Ball - 1 x daily - 7 x weekly - 2 sets - 10 reps Seated Knee Extension with Resistance - 1 x daily - 7 x weekly - 2 sets - 10 reps Seated Hamstring Curls with Resistance - 1 x daily - 7 x weekly - 2 sets - 10 reps Mini Squat - 1 x daily - 7 x weekly - 2 sets - 10 reps Shoulder Extension with Resistance - 1 x daily - 7 x weekly - 2 sets - 10 reps Standing Bilateral Low Shoulder Row with Anchored Resistance - 1 x daily - 7 x weekly - 2 sets - 10 reps Shoulder External Rotation and Scapular Retraction with Resistance - 1 x daily - 7 x weekly - 2 sets - 10 reps

## 2020-12-05 ENCOUNTER — Other Ambulatory Visit: Payer: Self-pay

## 2020-12-05 ENCOUNTER — Ambulatory Visit: Payer: Medicare Other | Admitting: Physical Therapy

## 2020-12-05 ENCOUNTER — Encounter: Payer: Self-pay | Admitting: Physical Therapy

## 2020-12-05 DIAGNOSIS — R2689 Other abnormalities of gait and mobility: Secondary | ICD-10-CM

## 2020-12-05 DIAGNOSIS — R262 Difficulty in walking, not elsewhere classified: Secondary | ICD-10-CM | POA: Diagnosis not present

## 2020-12-05 DIAGNOSIS — M6281 Muscle weakness (generalized): Secondary | ICD-10-CM

## 2020-12-05 DIAGNOSIS — R2681 Unsteadiness on feet: Secondary | ICD-10-CM | POA: Diagnosis not present

## 2020-12-05 NOTE — Therapy (Signed)
[place]. [place], [place], [address] Phone: [phone]   Fax:  [phone]  Physical Therapy Treatment  Patient Details  Name: Gerald [name] MRN: [id number] Date of Birth: 09-Oct-1945 No data recorded  Encounter Date: 12/05/2020   PT End of Session - 12/05/20 1656     Visit Number 10    Number of Visits 17    Date for PT Re-Evaluation 12/10/20    PT Start Time 1645    PT Stop Time 1718    PT Time Calculation (min) 33 min    Activity Tolerance Patient tolerated treatment well    Behavior During Therapy [facility] for tasks assessed/performed             Past Medical History:  Diagnosis Date   Abnormality of gait 10/05/2014   Anxiety    Bilateral edema of lower extremity    LE Dopplers 4/22 - no thrombus or thrombophelbitis, no reflux   Colon polyp    DDD (degenerative disc disease)    Depression    Diabetes mellitus without complication ([place])    controlled by diet   DJD (degenerative joint disease)    DOE (dyspnea on exertion)    Echo 04 /22/2014 - normal EF, mild LVH, grade 1 Diastolic Dysfunction; Lexiscan Myoview 04/28/12 - no ishcemia or infarction, LBBB septal wall motion; EF ~60%   Fatigue    GERD (gastroesophageal reflux disease)    H/O iron deficiency anemia    with  hemmorrhoids   Hyperlipidemia    Hypertension    Left bundle branch block     chronic, negative Myoview a normal echo as noted above    Osteoarthritis    with bilateral hip surgeries and multiple complication, with the most recent  surgery in January 2012   Scrotal edema    Type 2 diabetes mellitus without complications ([place]) 81/85/6314   Vitamin D deficiency     Past Surgical History:  Procedure Laterality Date   Cardiology Nuclear Med Study   04 24 2014   overall impression ; NORMAL  STRESS  NUCLEAR STUDY   HIP SURGERY  2004   Venous Duplex  04 22 2014   LOWER EXTREMITY SWELLING -Impressions [name] ; This is a normal  bilateral  lower extremity venous duplex Doppler evaluation    There were no vitals filed for this visit.   Subjective Assessment - 12/05/20 1648     Subjective Patient reports headache last night in the posterior R aspect of his head. TTP at base of skull on R. He reports overall fatigue. He was performing his HEP prior to that.    Pertinent History DJD, Depression, DDD    Patient Stated Goals Patient would like to get back to the gym, have more confidence, not need a RW.    Currently in Pain? Yes    Pain Score 2     Pain Location Head    Pain Orientation Posterior;Right    Pain Descriptors / Indicators Aching    Pain Type Acute pain    Pain Onset Yesterday    Pain Frequency Intermittent                [facility] PT Assessment - 12/05/20 0001       Strength   Left Hip Flexion 3-/5    Left Hip Extension 3/5    Left Hip ABduction 3-/5      Ambulation/Gait   Stairs Yes    Stairs  Assistance 6: Modified independent (Device/Increase time)    Stair Management Technique One rail Right    Number of Stairs 5    Height of Stairs 4   4" and 6", increased difficulty with 6" steps.                          [facility] Adult PT Treatment/Exercise - 12/05/20 0001       Knee/Hip Exercises: Aerobic   Nustep L5 x 5 minutes                     PT Education - 12/05/20 1715     Education Details Re-emphasized importance of HEP    Person(s) Educated Patient    Methods Explanation    Comprehension Verbalized understanding              PT Short Term Goals - 12/05/20 1658       PT SHORT TERM GOAL #1   Title Patient will perform basic HEP with MI, using written instructions.    Baseline HEP updated. He reports he has performed 1 time in the past week.    Time 1    Period Weeks    Status On-going               PT Long Term Goals - 12/05/20 1701       PT LONG TERM GOAL #1   Title Improve L hip strenght to 3/5    Baseline 2+-(3-)/5    Time 4     Period Weeks    Status On-going    Target Date 01/02/21      PT LONG TERM GOAL #2   Title Patient will perform advanced HEP MI with written instructions    Baseline Initiated    Time 4    Period Weeks    Status On-going    Target Date 01/02/21      PT LONG TERM GOAL #3   Title ambualte x at least 500' with LRAD, MI, including uneven surfaces.    Baseline Unsteady with cane, 50', CGA.    Time 4    Period Weeks    Status On-going    Target Date 01/02/21      PT LONG TERM GOAL #4   Title Up and down 4 steps using U rail, with step to gait pattern, S    Baseline CGA, mostly steady with rail, but increased difficulty with 6" step.    Time 4    Period Weeks    Status On-going    Target Date 01/02/21      PT LONG TERM GOAL #5   Title Patient will demosntrate improved weight hsift onto LLE in gait iwth longer step length and equal stance time through each LE.    Baseline Step to gait with decreased WB through LLE.    Time 4    Period Weeks    Status On-going    Target Date 01/02/21                   Plan - 12/05/20 1805     Clinical Impression Statement Patient arrived 13 minutes late for session. Reported new onset of head ache in R posterior, inferior skull. No weakness, change of sensation noted. Therapist noted mild tightness of muscle attachments. He reports he has done his HEP x 1 since last visit. he states that he is not feeling well after head ache, but willing  to attempt to participate. Patient's functional status re-assessed. Unable to assess gait due to limited time. Patient educated again to the importance of consistently performing HEP. He was also educated to monitor his fatigue and his headache closely, and contact physician if he continues to feel so poorly.    Personal Factors and Comorbidities Age;Comorbidity 3+    Comorbidities leg length discrepancy, arthritis, DDD, L BBB    Examination-Activity Limitations Bathing;Locomotion  Level;Transfers;Bend;Sleep;Squat;Stairs;Stand;Lift    Stability/Clinical Decision Making Stable/Uncomplicated    Clinical Decision Making Low    Rehab Potential Good    PT Frequency 2x / week    PT Duration 6 weeks   7w   PT Treatment/Interventions ADLs/Self Care Home Management;Electrical Stimulation;DME Instruction;Moist Heat;Iontophoresis 4mg /ml Dexamethasone;Gait training;Stair training;Functional mobility training;Therapeutic activities;Therapeutic exercise;Balance training;Neuromuscular re-education;Manual techniques;Patient/family education    PT Next Visit Plan F/U with HEP    PT Home Exercise Plan Initiated    Consulted and Agree with Plan of Care Patient             Patient will benefit from skilled therapeutic intervention in order to improve the following deficits and impairments:  Abnormal gait, Decreased coordination, Difficulty walking, Cardiopulmonary status limiting activity, Decreased endurance, Decreased activity tolerance, Decreased balance, Decreased mobility, Decreased strength, Impaired flexibility, Improper body mechanics  Visit Diagnosis: Unsteadiness on feet  Muscle weakness (generalized)  Difficulty in walking, not elsewhere classified  Other abnormalities of gait and mobility     Problem List Patient Active Problem List   Diagnosis Date Noted   Anemia of chronic disease 11/07/2020   Recurrent falls 11/07/2020   Pseudophakia of both eyes 10/16/2020   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 ([place]) 10/16/2020   Macular pucker, right eye 10/16/2020   Mild nonproliferative diabetic retinopathy of left eye ([place]) 10/16/2020   Anxiety state 01/04/2020   Benign prostatic hyperplasia without lower urinary tract symptoms 01/04/2020   Chronic diastolic heart failure ([place]) 01/04/2020   Diabetic renal disease ([place]) 01/04/2020   Diabetic retinopathy ([place]) 01/04/2020   ED (erectile  dysfunction) of organic origin 01/04/2020   Hypertrophic cardiomyopathy ([place]) 01/04/2020   Insomnia 01/04/2020   Left bundle branch block 01/04/2020   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   Primary localized osteoarthritis of pelvic region and thigh 01/04/2020   Stage 3 chronic kidney disease ([place]) 01/04/2020   Vitamin B12 deficiency (non anemic) 01/04/2020   Recurrent major depression ([place]) 01/04/2020   Severe episode of recurrent major depressive disorder, without psychotic features ([place]) 10/05/2018   Cognitive complaints with normal neuropsychological exam 03/11/2018   Referred otalgia of left ear 09/23/2016   Temporomandibular joint (TMJ) pain 09/23/2016   Cervicogenic headache 09/03/2016   Obstructive sleep apnea syndrome 11/15/2015   Memory difficulties 10/05/2014   Abnormality of gait 10/05/2014   Intermittent explosive disorder 09/25/2014   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   Encounter for long-term (current) use of other medications 04/28/2013   Vitamin D Deficiency 04/28/2013   Sleep disorder -Epworth OSA scale = 15 06/02/2012   Anal fissure 08/09/2010   Septic arthritis of hip ([place]) 04/11/2010   HYPERLIPIDEMIA 01/11/2010   DEPRESSION 01/11/2010   HYPERTENSION 01/11/2010   GERD 01/11/2010   Depression 01/11/2010   Hyperlipidemia 01/11/2010   T2 NIDDM w/Stage 2 CKD 09/21/2008   ANEMIA, IRON DEFICIENCY 09/21/2008   Personal history of colonic polyps 09/21/2008   Anemia, iron deficiency 09/21/2008    Marcelina Morel, DPT  12/05/2020, 6:11 PM  [place]. [place], [place], [address] Phone: [phone]   Fax:  [phone]  Name: AAYAN HASKEW MRN: [id number] Date of Birth: 07/15/1945

## 2020-12-11 ENCOUNTER — Ambulatory Visit: Payer: Medicare Other | Attending: Neurology | Admitting: Physical Therapy

## 2020-12-11 ENCOUNTER — Other Ambulatory Visit: Payer: Self-pay

## 2020-12-11 DIAGNOSIS — R2689 Other abnormalities of gait and mobility: Secondary | ICD-10-CM | POA: Diagnosis not present

## 2020-12-11 DIAGNOSIS — M6281 Muscle weakness (generalized): Secondary | ICD-10-CM | POA: Diagnosis not present

## 2020-12-11 DIAGNOSIS — R2681 Unsteadiness on feet: Secondary | ICD-10-CM | POA: Diagnosis not present

## 2020-12-11 DIAGNOSIS — R262 Difficulty in walking, not elsewhere classified: Secondary | ICD-10-CM | POA: Diagnosis not present

## 2020-12-11 NOTE — Therapy (Signed)
[place] [place], [place], [address] Phone: [phone]   Fax:  [phone]  Physical Therapy Treatment  Patient Details  Name: Gerald [name] MRN: [id number] Date of Birth: February 24, 1945 No data recorded  Encounter Date: 12/11/2020   PT End of Session - 12/11/20 1702     Visit Number 11    Number of Visits 17    Date for PT Re-Evaluation 12/10/20    PT Start Time 1615    PT Stop Time 1700    PT Time Calculation (min) 45 min             Past Medical History:  Diagnosis Date   Abnormality of gait 10/05/2014   Anxiety    Bilateral edema of lower extremity    LE Dopplers 4/22 - no thrombus or thrombophelbitis, no reflux   Colon polyp    DDD (degenerative disc disease)    Depression    Diabetes mellitus without complication ([place])    controlled by diet   DJD (degenerative joint disease)    DOE (dyspnea on exertion)    Echo 04 /22/2014 - normal EF, mild LVH, grade 1 Diastolic Dysfunction; Lexiscan Myoview 04/28/12 - no ishcemia or infarction, LBBB septal wall motion; EF ~60%   Fatigue    GERD (gastroesophageal reflux disease)    H/O iron deficiency anemia    with  hemmorrhoids   Hyperlipidemia    Hypertension    Left bundle branch block     chronic, negative Myoview a normal echo as noted above    Osteoarthritis    with bilateral hip surgeries and multiple complication, with the most recent  surgery in January 2012   Scrotal edema    Type 2 diabetes mellitus without complications ([place]) 81/44/8185   Vitamin D deficiency     Past Surgical History:  Procedure Laterality Date   Cardiology Nuclear Med Study   04 24 2014   overall impression ; NORMAL  STRESS  NUCLEAR STUDY   HIP SURGERY  2004   Venous Duplex  04 22 2014   LOWER EXTREMITY SWELLING -Impressions [name] ; This is a normal bilateral  lower extremity venous duplex Doppler evaluation    There were no vitals filed for this visit.    Subjective Assessment - 12/11/20 1619     Subjective pain comes and goes in BIL LE. not doing HEP like I should . ready to work    Currently in Pain? No/denies                               [facility] Adult PT Treatment/Exercise - 12/11/20 0001       High Level Balance   High Level Balance Activities Backward walking;Marching forwards;Side stepping   HHA red tband 15 feet 2 x each. decreased confidence with SLS on Left and decreased balance   High Level Balance Comments stepping on/off airex with RT LE to increase SLS on Left mod A - very difficut   ball toss on airex CGA     Knee/Hip Exercises: Aerobic   Nustep L5 x 6 minutes      Knee/Hip Exercises: Machines for Strengthening   Cybex Knee Extension 5# LLE 2 sets 10    Cybex Knee Flexion 10# LLE 2 sets 10   20# BIL 10 x     Knee/Hip Exercises: Seated   Clamshell with TheraBand Red   20x  Marching Strengthening;Both;2 sets;10 reps   red tband   Sit to Sand 2 sets;10 reps;without UE support   wt ball                      PT Short Term Goals - 12/05/20 1658       PT SHORT TERM GOAL #1   Title Patient will perform basic HEP with MI, using written instructions.    Baseline HEP updated. He reports he has performed 1 time in the past week.    Time 1    Period Weeks    Status On-going               PT Long Term Goals - 12/05/20 1701       PT LONG TERM GOAL #1   Title Improve L hip strenght to 3/5    Baseline 2+-(3-)/5    Time 4    Period Weeks    Status On-going    Target Date 01/02/21      PT LONG TERM GOAL #2   Title Patient will perform advanced HEP MI with written instructions    Baseline Initiated    Time 4    Period Weeks    Status On-going    Target Date 01/02/21      PT LONG TERM GOAL #3   Title ambualte x at least 500' with LRAD, MI, including uneven surfaces.    Baseline Unsteady with cane, 50', CGA.    Time 4    Period Weeks    Status On-going    Target Date  01/02/21      PT LONG TERM GOAL #4   Title Up and down 4 steps using U rail, with step to gait pattern, S    Baseline CGA, mostly steady with rail, but increased difficulty with 6" step.    Time 4    Period Weeks    Status On-going    Target Date 01/02/21      PT LONG TERM GOAL #5   Title Patient will demosntrate improved weight hsift onto LLE in gait iwth longer step length and equal stance time through each LE.    Baseline Step to gait with decreased WB through LLE.    Time 4    Period Weeks    Status On-going    Target Date 01/02/21                   Plan - 12/11/20 1631     Clinical Impression Statement pt states he feels that PT is helping but does state lack of motivation at home for HEP. pt is fearfull of falling.pt motivated and cooperative in PT session today. advance ex with assistance and cuing needed. pt with decreased SLS , trust and stability with LLE and decreased balance.    PT Treatment/Interventions ADLs/Self Care Home Management;Electrical Stimulation;DME Instruction;Moist Heat;Iontophoresis 4mg /ml Dexamethasone;Gait training;Stair training;Functional mobility training;Therapeutic activities;Therapeutic exercise;Balance training;Neuromuscular re-education;Manual techniques;Patient/family education    PT Next Visit Plan focus on stength and func in clinic and hopefully as pt sees more beneifts it will motivate him at home             Patient will benefit from skilled therapeutic intervention in order to improve the following deficits and impairments:  Abnormal gait, Decreased coordination, Difficulty walking, Cardiopulmonary status limiting activity, Decreased endurance, Decreased activity tolerance, Decreased balance, Decreased mobility, Decreased strength, Impaired flexibility, Improper body mechanics  Visit Diagnosis: Unsteadiness on feet  Muscle weakness (  generalized)  Difficulty in walking, not elsewhere classified     Problem List Patient  Active Problem List   Diagnosis Date Noted   Anemia of chronic disease 11/07/2020   Recurrent falls 11/07/2020   Pseudophakia of both eyes 10/16/2020   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 ([place]) 10/16/2020   Macular pucker, right eye 10/16/2020   Mild nonproliferative diabetic retinopathy of left eye ([place]) 10/16/2020   Anxiety state 01/04/2020   Benign prostatic hyperplasia without lower urinary tract symptoms 01/04/2020   Chronic diastolic heart failure ([place]) 01/04/2020   Diabetic renal disease ([place]) 01/04/2020   Diabetic retinopathy ([place]) 01/04/2020   ED (erectile dysfunction) of organic origin 01/04/2020   Hypertrophic cardiomyopathy ([place]) 01/04/2020   Insomnia 01/04/2020   Left bundle branch block 01/04/2020   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   Primary localized osteoarthritis of pelvic region and thigh 01/04/2020   Stage 3 chronic kidney disease ([place]) 01/04/2020   Vitamin B12 deficiency (non anemic) 01/04/2020   Recurrent major depression ([place]) 01/04/2020   Severe episode of recurrent major depressive disorder, without psychotic features ([place]) 10/05/2018   Cognitive complaints with normal neuropsychological exam 03/11/2018   Referred otalgia of left ear 09/23/2016   Temporomandibular joint (TMJ) pain 09/23/2016   Cervicogenic headache 09/03/2016   Obstructive sleep apnea syndrome 11/15/2015   Memory difficulties 10/05/2014   Abnormality of gait 10/05/2014   Intermittent explosive disorder 09/25/2014   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   Encounter for long-term (current) use of other medications 04/28/2013   Vitamin D Deficiency 04/28/2013   Sleep disorder -Epworth OSA scale = 15 06/02/2012   Anal fissure 08/09/2010   Septic arthritis of hip ([place]) 04/11/2010   HYPERLIPIDEMIA 01/11/2010   DEPRESSION 01/11/2010   HYPERTENSION 01/11/2010   GERD 01/11/2010    Depression 01/11/2010   Hyperlipidemia 01/11/2010   T2 NIDDM w/Stage 2 CKD 09/21/2008   ANEMIA, IRON DEFICIENCY 09/21/2008   Personal history of colonic polyps 09/21/2008   Anemia, iron deficiency 09/21/2008    Lejon Afzal,ANGIE, PTA 12/11/2020, 5:03 PM  [place] [place]. [place], [place], [address] Phone: [phone]   Fax:  [phone]  Name: Gerald [name] MRN: [id number] Date of Birth: Nov 18, 1945

## 2020-12-13 ENCOUNTER — Ambulatory Visit: Payer: Medicare Other | Admitting: Physical Therapy

## 2020-12-17 ENCOUNTER — Other Ambulatory Visit: Payer: Self-pay

## 2020-12-17 ENCOUNTER — Ambulatory Visit: Payer: Medicare Other | Admitting: Physical Therapy

## 2020-12-17 ENCOUNTER — Encounter: Payer: Self-pay | Admitting: Physical Therapy

## 2020-12-17 DIAGNOSIS — R2681 Unsteadiness on feet: Secondary | ICD-10-CM | POA: Diagnosis not present

## 2020-12-17 DIAGNOSIS — R2689 Other abnormalities of gait and mobility: Secondary | ICD-10-CM

## 2020-12-17 DIAGNOSIS — R262 Difficulty in walking, not elsewhere classified: Secondary | ICD-10-CM

## 2020-12-17 DIAGNOSIS — M6281 Muscle weakness (generalized): Secondary | ICD-10-CM

## 2020-12-17 NOTE — Therapy (Signed)
[place]. [place], [place], [address] Phone: [phone]   Fax:  [phone]  Physical Therapy Treatment  Patient Details  Name: Gerald [name] MRN: [id number] Date of Birth: 12-Feb-1945 No data recorded  Encounter Date: 12/17/2020   PT End of Session - 12/17/20 1701     Visit Number 12    Number of Visits 17    Date for PT Re-Evaluation 12/10/20    PT Start Time 1630    PT Stop Time 6962    PT Time Calculation (min) 45 min    Activity Tolerance Patient tolerated treatment well    Behavior During Therapy [employer] for tasks assessed/performed             Past Medical History:  Diagnosis Date   Abnormality of gait 10/05/2014   Anxiety    Bilateral edema of lower extremity    LE Dopplers 4/22 - no thrombus or thrombophelbitis, no reflux   Colon polyp    DDD (degenerative disc disease)    Depression    Diabetes mellitus without complication ([place])    controlled by diet   DJD (degenerative joint disease)    DOE (dyspnea on exertion)    Echo 04 /22/2014 - normal EF, mild LVH, grade 1 Diastolic Dysfunction; Lexiscan Myoview 04/28/12 - no ishcemia or infarction, LBBB septal wall motion; EF ~60%   Fatigue    GERD (gastroesophageal reflux disease)    H/O iron deficiency anemia    with  hemmorrhoids   Hyperlipidemia    Hypertension    Left bundle branch block     chronic, negative Myoview a normal echo as noted above    Osteoarthritis    with bilateral hip surgeries and multiple complication, with the most recent  surgery in January 2012   Scrotal edema    Type 2 diabetes mellitus without complications ([place]) 95/28/4132   Vitamin D deficiency     Past Surgical History:  Procedure Laterality Date   Cardiology Nuclear Med Study   04 24 2014   overall impression ; NORMAL  STRESS  NUCLEAR STUDY   HIP SURGERY  2004   Venous Duplex  04 22 2014   LOWER EXTREMITY SWELLING -Impressions Gerald [name] ; This is a normal  bilateral  lower extremity venous duplex Doppler evaluation    There were no vitals filed for this visit.   Subjective Assessment - 12/17/20 1629     Subjective Patient reports frustration with quad cane, feels wobbly, but also more supporetive than straight cane. he also reports new onset of pain in R side along illiac crest.    Pertinent History DJD, Depression, DDD    Patient Stated Goals Patient would like to get back to the gym, have more confidence, not need a RW.    Pain Onset Yesterday                               [facility] Adult PT Treatment/Exercise - 12/17/20 0001       Exercises   Exercises Shoulder      Knee/Hip Exercises: Aerobic   Nustep L4 x 4 minutes, then 2 x L7 for 20 seconds for strength, with 40 seconds rest.      Knee/Hip Exercises: Machines for Strengthening   Cybex Knee Extension 5# LLE 2 sets 10    Cybex Knee Flexion 15# BLE 3 sets 10      Knee/Hip  Exercises: Standing   Heel Raises Both;2 sets;10 reps    Hip Abduction Stengthening;Both;1 set;10 reps      Shoulder Exercises: Seated   Row Weight (lbs) 15    Row Limitations 2 x 10      Shoulder Exercises: Power [employer] 20 reps    Row Limitations 15#                     PT Education - 12/17/20 1658     Education Details Encouraged proper form and quality of movement.    Person(s) Educated Patient    Methods Explanation;Demonstration    Comprehension Verbalized understanding;Returned demonstration              PT Short Term Goals - 12/17/20 1635       PT SHORT TERM GOAL #1   Title Patient will perform basic HEP with MI, using written instructions.    Time 1    Period Weeks    Status On-going    Target Date 12/24/20               PT Long Term Goals - 12/05/20 1701       PT LONG TERM GOAL #1   Title Improve L hip strenght to 3/5    Baseline 2+-(3-)/5    Time 4    Period Weeks    Status On-going    Target Date 01/02/21      PT LONG TERM  GOAL #2   Title Patient will perform advanced HEP MI with written instructions    Baseline Initiated    Time 4    Period Weeks    Status On-going    Target Date 01/02/21      PT LONG TERM GOAL #3   Title ambualte x at least 500' with LRAD, MI, including uneven surfaces.    Baseline Unsteady with cane, 50', CGA.    Time 4    Period Weeks    Status On-going    Target Date 01/02/21      PT LONG TERM GOAL #4   Title Up and down 4 steps using U rail, with step to gait pattern, S    Baseline CGA, mostly steady with rail, but increased difficulty with 6" step.    Time 4    Period Weeks    Status On-going    Target Date 01/02/21      PT LONG TERM GOAL #5   Title Patient will demosntrate improved weight hsift onto LLE in gait iwth longer step length and equal stance time through each LE.    Baseline Step to gait with decreased WB through LLE.    Time 4    Period Weeks    Status On-going    Target Date 01/02/21                   Plan - 12/17/20 1707     Clinical Impression Statement Patient reports new onset of R illiac crest pain, today. It did not impede his participation or performance.    Personal Factors and Comorbidities Age;Comorbidity 3+    Examination-Activity Limitations Bathing;Locomotion Level;Transfers;Bend;Sleep;Squat;Stairs;Stand;Lift    Stability/Clinical Decision Making Stable/Uncomplicated    Clinical Decision Making Low    Rehab Potential Good    PT Frequency 2x / week    PT Duration 6 weeks    PT Treatment/Interventions ADLs/Self Care Home Management;Electrical Stimulation;DME Instruction;Moist Heat;Iontophoresis 4mg /ml Dexamethasone;Gait training;Stair training;Functional mobility training;Therapeutic activities;Therapeutic exercise;Balance training;Neuromuscular re-education;Manual  techniques;Patient/family education    PT Next Visit Plan focus on stength and func in clinic and hopefully as pt sees more beneifts it will motivate him at home    PT  Home Exercise Plan Initiated    Consulted and Agree with Plan of Care Patient             Patient will benefit from skilled therapeutic intervention in order to improve the following deficits and impairments:  Abnormal gait, Decreased coordination, Difficulty walking, Cardiopulmonary status limiting activity, Decreased endurance, Decreased activity tolerance, Decreased balance, Decreased mobility, Decreased strength, Impaired flexibility, Improper body mechanics  Visit Diagnosis: Unsteadiness on feet  Muscle weakness (generalized)  Difficulty in walking, not elsewhere classified  Other abnormalities of gait and mobility     Problem List Patient Active Problem List   Diagnosis Date Noted   Anemia of chronic disease 11/07/2020   Recurrent falls 11/07/2020   Pseudophakia of both eyes 10/16/2020   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 ([place]) 10/16/2020   Macular pucker, right eye 10/16/2020   Mild nonproliferative diabetic retinopathy of left eye ([place]) 10/16/2020   Anxiety state 01/04/2020   Benign prostatic hyperplasia without lower urinary tract symptoms 01/04/2020   Chronic diastolic heart failure ([place]) 01/04/2020   Diabetic renal disease ([place]) 01/04/2020   Diabetic retinopathy ([place]) 01/04/2020   ED (erectile dysfunction) of organic origin 01/04/2020   Hypertrophic cardiomyopathy ([place]) 01/04/2020   Insomnia 01/04/2020   Left bundle branch block 01/04/2020   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   Primary localized osteoarthritis of pelvic region and thigh 01/04/2020   Stage 3 chronic kidney disease ([place]) 01/04/2020   Vitamin B12 deficiency (non anemic) 01/04/2020   Recurrent major depression ([place]) 01/04/2020   Severe episode of recurrent major depressive disorder, without psychotic features ([place]) 10/05/2018   Cognitive complaints with normal neuropsychological exam 03/11/2018   Referred otalgia of  left ear 09/23/2016   Temporomandibular joint (TMJ) pain 09/23/2016   Cervicogenic headache 09/03/2016   Obstructive sleep apnea syndrome 11/15/2015   Memory difficulties 10/05/2014   Abnormality of gait 10/05/2014   Intermittent explosive disorder 09/25/2014   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   Encounter for long-term (current) use of other medications 04/28/2013   Vitamin D Deficiency 04/28/2013   Sleep disorder -Epworth OSA scale = 15 06/02/2012   Anal fissure 08/09/2010   Septic arthritis of hip ([place]) 04/11/2010   HYPERLIPIDEMIA 01/11/2010   DEPRESSION 01/11/2010   HYPERTENSION 01/11/2010   GERD 01/11/2010   Depression 01/11/2010   Hyperlipidemia 01/11/2010   T2 NIDDM w/Stage 2 CKD 09/21/2008   ANEMIA, IRON DEFICIENCY 09/21/2008   Personal history of colonic polyps 09/21/2008   Anemia, iron deficiency 09/21/2008    Marcelina Morel, DPT 12/17/2020, 6:48 PM  [place]. [place], [place], [address] Phone: [phone]   Fax:  [phone]  Name: Gerald [name] MRN: [id number] Date of Birth: Jun 20, 1945

## 2020-12-19 ENCOUNTER — Encounter: Payer: Self-pay | Admitting: Physical Therapy

## 2020-12-19 ENCOUNTER — Ambulatory Visit: Payer: Medicare Other | Admitting: Physical Therapy

## 2020-12-19 ENCOUNTER — Other Ambulatory Visit: Payer: Self-pay

## 2020-12-19 DIAGNOSIS — R2681 Unsteadiness on feet: Secondary | ICD-10-CM | POA: Diagnosis not present

## 2020-12-19 DIAGNOSIS — R2689 Other abnormalities of gait and mobility: Secondary | ICD-10-CM | POA: Diagnosis not present

## 2020-12-19 DIAGNOSIS — R262 Difficulty in walking, not elsewhere classified: Secondary | ICD-10-CM

## 2020-12-19 DIAGNOSIS — M6281 Muscle weakness (generalized): Secondary | ICD-10-CM | POA: Diagnosis not present

## 2020-12-19 NOTE — Therapy (Signed)
[facility] [place], [place], [address] Phone: [phone]   Fax:  [phone]  Physical Therapy Treatment  Patient Details  Name: Gerald [name] MRN: [id number] Date of Birth: [birth year] No data recorded  Encounter Date: 12/19/2020   PT End of Session - 12/19/20 1709     Visit Number 13    Number of Visits 17    Date for PT Re-Evaluation 12/10/20    PT Start Time 1633    PT Stop Time 1714    PT Time Calculation (min) 41 min    Activity Tolerance Patient tolerated treatment well    Behavior During Therapy [facility] for tasks assessed/performed             Past Medical History:  Diagnosis Date   Abnormality of gait 10/05/2014   Anxiety    Bilateral edema of lower extremity    LE Dopplers 4/22 - no thrombus or thrombophelbitis, no reflux   Colon polyp    DDD (degenerative disc disease)    Depression    Diabetes mellitus without complication ([place])    controlled by diet   DJD (degenerative joint disease)    DOE (dyspnea on exertion)    Echo 04 /22/2014 - normal EF, mild LVH, grade 1 Diastolic Dysfunction; Lexiscan Myoview 04/28/12 - no ishcemia or infarction, LBBB septal wall motion; EF ~60%   Fatigue    GERD (gastroesophageal reflux disease)    H/O iron deficiency anemia    with  hemmorrhoids   Hyperlipidemia    Hypertension    Left bundle branch block     chronic, negative Myoview a normal echo as noted above    Osteoarthritis    with bilateral hip surgeries and multiple complication, with the most recent  surgery in January 2012   Scrotal edema    Type 2 diabetes mellitus without complications ([place]) 94/49/6759   Vitamin D deficiency     Past Surgical History:  Procedure Laterality Date   Cardiology Nuclear Med Study   04 24 2014   overall impression ; NORMAL  STRESS  NUCLEAR STUDY   HIP SURGERY  2004   Venous Duplex  04 22 2014   LOWER EXTREMITY SWELLING -Impressions [name] ; This is a normal  bilateral  lower extremity venous duplex Doppler evaluation    There were no vitals filed for this visit.   Subjective Assessment - 12/19/20 1633     Subjective Patient felt ok after the last work out, but the next day he was very sore on his R side. He has had the pain before, ever since he fell about 10 days ago.    Pertinent History DJD, Depression, DDD    Pain Score 7     Pain Location Hip    Pain Orientation Right;Lateral    Pain Descriptors / Indicators Aching    Pain Type Acute pain    Pain Onset More than a month ago    Pain Frequency Intermittent    Multiple Pain Sites No                               [facility] Adult PT Treatment/Exercise - 12/19/20 0001       Knee/Hip Exercises: Aerobic   Nustep L5 x 4 minutes.      Knee/Hip Exercises: Seated   Sit to Sand 1 set;10 reps;without UE support   Therapist facilitated forward weight shift.  Knee/Hip Exercises: Supine   Bridges Strengthening;Both;10 reps    Bridges with Clamshell Strengthening;Both;10 reps   red T band   Other Supine Knee/Hip Exercises BLE      Shoulder Exercises: Power [employer] 20 reps    Row Limitations 15#    Other Power Tower Exercises lat pull downs 2 x 10 15#    Other Power Tower Exercises chest press 2 x 10 15#                     PT Education - 12/19/20 1708     Education Details Patient reports that he has not been performing HEP and says he probably should. Therapist encouraged him to try to do one exercise per day to start with and see how that goes.    Person(s) Educated Patient    Methods Explanation    Comprehension Verbalized understanding              PT Short Term Goals - 12/17/20 1635       PT SHORT TERM GOAL #1   Title Patient will perform basic HEP with MI, using written instructions.    Time 1    Period Weeks    Status On-going    Target Date 12/24/20               PT Long Term Goals - 12/05/20 1701       PT LONG TERM  GOAL #1   Title Improve L hip strenght to 3/5    Baseline 2+-(3-)/5    Time 4    Period Weeks    Status On-going    Target Date 01/02/21      PT LONG TERM GOAL #2   Title Patient will perform advanced HEP MI with written instructions    Baseline Initiated    Time 4    Period Weeks    Status On-going    Target Date 01/02/21      PT LONG TERM GOAL #3   Title ambualte x at least 500' with LRAD, MI, including uneven surfaces.    Baseline Unsteady with cane, 50', CGA.    Time 4    Period Weeks    Status On-going    Target Date 01/02/21      PT LONG TERM GOAL #4   Title Up and down 4 steps using U rail, with step to gait pattern, S    Baseline CGA, mostly steady with rail, but increased difficulty with 6" step.    Time 4    Period Weeks    Status On-going    Target Date 01/02/21      PT LONG TERM GOAL #5   Title Patient will demosntrate improved weight hsift onto LLE in gait iwth longer step length and equal stance time through each LE.    Baseline Step to gait with decreased WB through LLE.    Time 4    Period Weeks    Status On-going    Target Date 01/02/21                   Plan - 12/19/20 1711     Clinical Impression Statement Patient stated that he had pain the next day after last visit, flet it when bending forward to tie his shoe. It is improved today. He later reported that the pain has been coming and going since he fell 10 days ago. He participated well and did state that he  thinks he needs to be performing his HEp more consistently. Pelvis obliquity noted, probably due to leg length discrepancy. This may be contributing to pain. Therapist backed off slightly in exercises to avoid pain, but he still did a lot of work.    Personal Factors and Comorbidities Age;Comorbidity 3+    Comorbidities leg length discrepancy, arthritis, DDD, L BBB    Examination-Activity Limitations Bathing;Locomotion Level;Transfers;Bend;Sleep;Squat;Stairs;Stand;Lift     Stability/Clinical Decision Making Stable/Uncomplicated    Clinical Decision Making Low    Rehab Potential Good    PT Frequency 2x / week    PT Duration 6 weeks    PT Treatment/Interventions ADLs/Self Care Home Management;Electrical Stimulation;DME Instruction;Moist Heat;Iontophoresis 4mg /ml Dexamethasone;Gait training;Stair training;Functional mobility training;Therapeutic activities;Therapeutic exercise;Balance training;Neuromuscular re-education;Manual techniques;Patient/family education    PT Next Visit Plan focus on stength and func in clinic and hopefully as pt sees more beneifts it will motivate him at home    PT Home Exercise Plan Initiated    Consulted and Agree with Plan of Care Patient             Patient will benefit from skilled therapeutic intervention in order to improve the following deficits and impairments:  Abnormal gait, Decreased coordination, Difficulty walking, Cardiopulmonary status limiting activity, Decreased endurance, Decreased activity tolerance, Decreased balance, Decreased mobility, Decreased strength, Impaired flexibility, Improper body mechanics  Visit Diagnosis: Unsteadiness on feet  Difficulty in walking, not elsewhere classified  Other abnormalities of gait and mobility     Problem List Patient Active Problem List   Diagnosis Date Noted   Anemia of chronic disease 11/07/2020   Recurrent falls 11/07/2020   Pseudophakia of both eyes 10/16/2020   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 ([place]) 10/16/2020   Macular pucker, right eye 10/16/2020   Mild nonproliferative diabetic retinopathy of left eye ([place]) 10/16/2020   Anxiety state 01/04/2020   Benign prostatic hyperplasia without lower urinary tract symptoms 01/04/2020   Chronic diastolic heart failure ([place]) 01/04/2020   Diabetic renal disease ([place]) 01/04/2020   Diabetic retinopathy ([place]) 01/04/2020   ED (erectile  dysfunction) of organic origin 01/04/2020   Hypertrophic cardiomyopathy ([place]) 01/04/2020   Insomnia 01/04/2020   Left bundle branch block 01/04/2020   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   Primary localized osteoarthritis of pelvic region and thigh 01/04/2020   Stage 3 chronic kidney disease ([place]) 01/04/2020   Vitamin B12 deficiency (non anemic) 01/04/2020   Recurrent major depression ([place]) 01/04/2020   Severe episode of recurrent major depressive disorder, without psychotic features ([place]) 10/05/2018   Cognitive complaints with normal neuropsychological exam 03/11/2018   Referred otalgia of left ear 09/23/2016   Temporomandibular joint (TMJ) pain 09/23/2016   Cervicogenic headache 09/03/2016   Obstructive sleep apnea syndrome 11/15/2015   Memory difficulties 10/05/2014   Abnormality of gait 10/05/2014   Intermittent explosive disorder 09/25/2014   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   Encounter for long-term (current) use of other medications 04/28/2013   Vitamin D Deficiency 04/28/2013   Sleep disorder -Epworth OSA scale = 15 06/02/2012   Anal fissure 08/09/2010   Septic arthritis of hip ([place]) 04/11/2010   HYPERLIPIDEMIA 01/11/2010   DEPRESSION 01/11/2010   HYPERTENSION 01/11/2010   GERD 01/11/2010   Depression 01/11/2010   Hyperlipidemia 01/11/2010   T2 NIDDM w/Stage 2 CKD 09/21/2008   ANEMIA, IRON DEFICIENCY 09/21/2008   Personal history of colonic polyps 09/21/2008   Anemia, iron deficiency 09/21/2008  Marcelina Morel, DPT 12/19/2020, 5:15 PM  [facility]. [place], [place], [address] Phone: [phone]   Fax:  [phone]  Name: JACION DISMORE MRN: [id number] Date of Birth: [birth year]

## 2020-12-24 ENCOUNTER — Ambulatory Visit (INDEPENDENT_AMBULATORY_CARE_PROVIDER_SITE_OTHER): Payer: Medicare Other | Admitting: Ophthalmology

## 2020-12-24 ENCOUNTER — Other Ambulatory Visit: Payer: Self-pay

## 2020-12-24 ENCOUNTER — Ambulatory Visit: Payer: Medicare Other | Admitting: Physical Therapy

## 2020-12-24 ENCOUNTER — Encounter: Payer: Self-pay | Admitting: Physical Therapy

## 2020-12-24 DIAGNOSIS — E113211 Type 2 diabetes mellitus with mild nonproliferative diabetic retinopathy with macular edema, right eye: Secondary | ICD-10-CM | POA: Diagnosis not present

## 2020-12-24 DIAGNOSIS — R2681 Unsteadiness on feet: Secondary | ICD-10-CM

## 2020-12-24 DIAGNOSIS — R262 Difficulty in walking, not elsewhere classified: Secondary | ICD-10-CM | POA: Diagnosis not present

## 2020-12-24 DIAGNOSIS — M6281 Muscle weakness (generalized): Secondary | ICD-10-CM

## 2020-12-24 DIAGNOSIS — R2689 Other abnormalities of gait and mobility: Secondary | ICD-10-CM

## 2020-12-24 DIAGNOSIS — H43813 Vitreous degeneration, bilateral: Secondary | ICD-10-CM | POA: Diagnosis not present

## 2020-12-24 DIAGNOSIS — G4733 Obstructive sleep apnea (adult) (pediatric): Secondary | ICD-10-CM | POA: Diagnosis not present

## 2020-12-24 NOTE — Assessment & Plan Note (Signed)
Moderate to severe CPAP noted in the past, patient not using CPAP and thus the insurance took it away.  He is trying to regain this access

## 2020-12-24 NOTE — Assessment & Plan Note (Signed)
Physiologic OU observe 

## 2020-12-24 NOTE — Progress Notes (Signed)
12/24/2020     CHIEF COMPLAINT Patient presents for  Chief Complaint  Patient presents with   Retina Follow Up      HISTORY OF PRESENT ILLNESS: Gerald [name] is a 75 y.o. male who presents to the clinic today for:   HPI     Retina Follow Up           Diagnosis: Diabetic Retinopathy   Onset: 8 weeks ago   Severity: mild   Duration: 8 weeks   Course: stable         Comments   8 weeks fu OU oct. Pt states "I think my right eye is getting better." Denies new FOL or floaters. Pt states dorzolamide bid OD.      Last edited by Laurin Coder on 12/24/2020  1:57 PM.      Referring physician: [name], DO [address] [place],  [place] [address]  HISTORICAL INFORMATION:   Selected notes from the MEDICAL RECORD NUMBER    Lab Results  Component Value Date   HGBA1C 5.6 06/24/2013     CURRENT MEDICATIONS: Current Outpatient Medications (Ophthalmic Drugs)  Medication Sig   ciprofloxacin (CILOXAN) 0.3 % ophthalmic solution SMARTSIG:In Eye(s)   dorzolamide (TRUSOPT) 2 % ophthalmic solution Place 1 drop into the right eye 2 (two) times daily.   No current facility-administered medications for this visit. (Ophthalmic Drugs)   Current Outpatient Medications (Other)  Medication Sig   acetaminophen (TYLENOL) 325 MG tablet 1 tablet as needed   ALPRAZolam (XANAX) 0.25 MG tablet    amLODipine (NORVASC) 10 MG tablet    amLODipine (NORVASC) 10 MG tablet 1 tablet   ascorbic acid (VITAMIN C) 500 MG tablet 1 tablet   aspirin 81 MG tablet Take 81 mg by mouth daily.   atorvastatin (LIPITOR) 10 MG tablet    atorvastatin (LIPITOR) 10 MG tablet 1 tablet   bisoprolol-hydrochlorothiazide (ZIAC) 2.5-6.25 MG tablet    buPROPion (WELLBUTRIN XL) 150 MG 24 hr tablet TAKE 1 TABLET (150 MG TOTAL) BY MOUTH EVERY MORNING.   buPROPion (WELLBUTRIN XL) 300 MG 24 hr tablet    Cholecalciferol (VITAMIN D3) 5000 UNITS CAPS Take 5,000 Units by mouth daily.     doxazosin (CARDURA) 8 MG tablet Take 1 tablet (8 mg total) by mouth daily.   enalapril (VASOTEC) 20 MG tablet Take 20 mg by mouth 2 (two) times daily.   Ferrous Sulfate (IRON) 325 (65 FE) MG TABS Take 65 mg by mouth daily.    Fiber, Guar Gum, CHEW Chew 1 capsule by mouth daily.   FLUoxetine (PROZAC) 40 MG capsule Take 1 capsule (40 mg total) by mouth daily.   FLUoxetine (PROZAC) 40 MG capsule 1 capsule in the morning   furosemide (LASIX) 20 MG tablet 1 tablet   Magnesium 250 MG TABS Take 1 tablet by mouth daily.   Melatonin 5 MG SUBL Place 5 mg under the tongue at bedtime as needed (sleep).    Multiple Vitamin (MULTIVITAMIN WITH MINERALS) TABS tablet Take 1 tablet by mouth daily.   Multiple Vitamins-Minerals (CENTRUM SILVER 50+MEN) TABS See admin instructions.   nystatin (MYCOSTATIN/NYSTOP) 100000 UNIT/GM POWD Apply 1 g topically 2 (two) times daily as needed (rash).   omeprazole (PRILOSEC) 40 MG capsule Take 1 capsule (40 mg total) by mouth daily.   polyethylene glycol (MIRALAX / GLYCOLAX) packet Take 17 g by mouth daily.   rivastigmine (EXELON) 1.5 MG capsule TAKE 1 CAPSULE(1.5 MG) BY MOUTH TWICE DAILY  Sennosides (EX-LAX PO) Take 1 tablet by mouth daily as needed (constipation).   vitamin B-12 (CYANOCOBALAMIN) 1000 MCG tablet Take 1,000 mcg by mouth daily.   vitamin C (ASCORBIC ACID) 500 MG tablet 1 tablet   ZINC OXIDE, TOPICAL, 10 % CREA Apply 1 application topically as needed (for rash).   No current facility-administered medications for this visit. (Other)      REVIEW OF SYSTEMS:    ALLERGIES No Known Allergies  PAST MEDICAL HISTORY Past Medical History:  Diagnosis Date   Abnormality of gait 10/05/2014   Anxiety    Bilateral edema of lower extremity    LE Dopplers 4/22 - no thrombus or thrombophelbitis, no reflux   Colon polyp    DDD (degenerative disc disease)    Depression    Diabetes mellitus without complication ([name])    controlled by diet   DJD (degenerative  joint disease)    DOE (dyspnea on exertion)    Echo 04 /22/2014 - normal EF, mild LVH, grade 1 Diastolic Dysfunction; Lexiscan Myoview 04/28/12 - no ishcemia or infarction, LBBB septal wall motion; EF ~60%   Fatigue    GERD (gastroesophageal reflux disease)    H/O iron deficiency anemia    with  hemmorrhoids   Hyperlipidemia    Hypertension    Left bundle branch block     chronic, negative Myoview a normal echo as noted above    Osteoarthritis    with bilateral hip surgeries and multiple complication, with the most recent  surgery in January 2012   Scrotal edema    Type 2 diabetes mellitus without complications ([place]) 53/29/9242   Vitamin D deficiency    Past Surgical History:  Procedure Laterality Date   Cardiology Nuclear Med Study   04 24 2014   overall impression ; NORMAL  STRESS  NUCLEAR STUDY   HIP SURGERY  2004   Venous Duplex  04 22 2014   LOWER EXTREMITY SWELLING -Impressions [name] ; This is a normal bilateral  lower extremity venous duplex Doppler evaluation    FAMILY HISTORY Family History  Problem Relation Age of Onset   Dementia Mother    Hypertension Mother    Heart disease Mother    Diabetes Mother    Heart attack Maternal Grandmother    Diabetes Maternal Grandfather     SOCIAL HISTORY Social History   Tobacco Use   Smoking status: Former    Packs/day: 0.50    Years: 30.00    Pack years: 15.00    Types: Cigarettes    Quit date: 03/30/1983    Years since quitting: 37.7   Smokeless tobacco: Never  Substance Use Topics   Alcohol use: Yes    Alcohol/week: 1.0 standard drink    Types: 1 Standard drinks or equivalent per week    Comment: couple times a year   Drug use: No         OPHTHALMIC EXAM:  Base Eye Exam     Visual Acuity (ETDRS)       Right Left   Dist [place] 20/60 -2 20/20   Dist ph [place] 20/30 -1          Tonometry (Tonopen, 1:55 PM)       Right Left   Pressure 15 14         Pupils       Pupils Dark Light APD   Right  PERRL 3 2 None   Left PERRL 3 2 None  Extraocular Movement       Right Left    Full Full         Neuro/Psych     Oriented x3: Yes   Mood/Affect: Normal         Dilation     Both eyes: 1.0% Mydriacyl, 2.5% Phenylephrine @ 1:55 PM           Slit Lamp and Fundus Exam     External Exam       Right Left   External Normal Normal         Slit Lamp Exam       Right Left   Lids/Lashes Normal Normal   Conjunctiva/Sclera White and quiet White and quiet   Cornea Clear Clear   Anterior Chamber Deep and quiet Deep and quiet   Iris Round and reactive Round and reactive   Lens Centered posterior chamber intraocular lens Centered posterior chamber intraocular lens   Anterior Vitreous Normal Normal            IMAGING AND PROCEDURES  Imaging and Procedures for 12/24/20  OCT, Retina - OU - Both Eyes       Right Eye Quality was good. Scan locations included subfoveal. Central Foveal Thickness: 415. Progression has improved. Findings include abnormal foveal contour.   Left Eye Quality was good. Scan locations included subfoveal. Central Foveal Thickness: 264. Progression has been stable. Findings include normal foveal contour.   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  Patient continues on Trusopt only.  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  He is trying to regain this access  Posterior vitreous  detachment of both eyes Physiologic OU observe     ICD-10-CM   1. 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 Obstructive sleep apnea syndrome  G47.33     3. Posterior vitreous detachment of both eyes  H43.813       1.  OD with less CSME overall with mild NPDR likely some role of ongoing macular hypoxic stress from untreated sleep apnea.  Patient reports he is going to make some effort to regain access to therapy for his diagnosed sleep apnea he may very well have to reach out to his primary care physician to arrange this issue again  2.  Prevention nightly hypoxia with only help his eyes and prevent further progression of ocular disease particularly retinal disease in the macula but mostly his systemic sense of wellbeing  3.  Ophthalmic Meds Ordered this visit:  No orders of the defined types were placed in this encounter.      Return in about 3 months (around 03/24/2021) for DILATE OU, OCT.  There are no Patient Instructions on file for this visit.   Explained the diagnoses, plan, and  follow up with the patient and they expressed understanding.  Patient expressed understanding of the importance of proper follow up care.   Clent Demark Oshen Wlodarczyk M.D. Diseases & Surgery of the Retina and Vitreous [facility] [place] 12/24/20     Abbreviations: M myopia (nearsighted); A astigmatism; H hyperopia (farsighted); P presbyopia; Mrx spectacle prescription;  CTL contact lenses; OD right eye; OS left eye; OU both eyes  XT exotropia; ET esotropia; PEK punctate epithelial keratitis; PEE punctate epithelial erosions; DES dry eye syndrome; MGD meibomian gland dysfunction; ATs artificial tears; PFAT's preservative free artificial tears; [place] nuclear sclerotic cataract; PSC posterior subcapsular cataract; ERM epi-retinal membrane; PVD posterior vitreous detachment; RD  retinal detachment; DM diabetes mellitus; DR diabetic retinopathy; NPDR non-proliferative diabetic retinopathy; PDR proliferative diabetic retinopathy; CSME clinically significant macular edema; DME diabetic macular edema; dbh dot blot hemorrhages; CWS cotton wool spot; POAG primary open angle glaucoma; C/D cup-to-disc ratio; HVF humphrey visual field; GVF goldmann visual field; OCT optical coherence tomography; IOP intraocular pressure; BRVO Branch retinal vein occlusion; CRVO central retinal vein occlusion; CRAO central retinal artery occlusion; BRAO branch retinal artery occlusion; RT retinal tear; SB scleral buckle; PPV pars plana vitrectomy; VH Vitreous hemorrhage; PRP panretinal laser photocoagulation; IVK intravitreal kenalog; VMT vitreomacular traction; MH Macular hole;  NVD neovascularization of the disc; NVE neovascularization elsewhere; AREDS age related eye disease study; ARMD age related macular degeneration; POAG primary open angle glaucoma; EBMD epithelial/anterior basement membrane dystrophy; ACIOL anterior chamber intraocular lens; IOL intraocular lens; PCIOL posterior chamber intraocular lens; Phaco/IOL phacoemulsification with intraocular lens placement; [place] photorefractive keratectomy; LASIK laser assisted in situ keratomileusis; HTN hypertension; DM diabetes mellitus; COPD chronic obstructive pulmonary disease

## 2020-12-24 NOTE — Therapy (Signed)
[place]. [place], [place], [address] Phone: [phone]   Fax:  [phone]  Physical Therapy Treatment  Patient Details  Name: Gerald [name] MRN: [id number] Date of Birth: 12/21/1945 No data recorded  Encounter Date: 12/24/2020   PT End of Session - 12/24/20 1828     Visit Number 14    Number of Visits 17    Date for PT Re-Evaluation 12/10/20    PT Start Time 1950    PT Stop Time 1840    PT Time Calculation (min) 46 min    Activity Tolerance Patient tolerated treatment well    Behavior During Therapy [facility] for tasks assessed/performed             Past Medical History:  Diagnosis Date   Abnormality of gait 10/05/2014   Anxiety    Bilateral edema of lower extremity    LE Dopplers 4/22 - no thrombus or thrombophelbitis, no reflux   Colon polyp    DDD (degenerative disc disease)    Depression    Diabetes mellitus without complication ([place])    controlled by diet   DJD (degenerative joint disease)    DOE (dyspnea on exertion)    Echo 04 /22/2014 - normal EF, mild LVH, grade 1 Diastolic Dysfunction; Lexiscan Myoview 04/28/12 - no ishcemia or infarction, LBBB septal wall motion; EF ~60%   Fatigue    GERD (gastroesophageal reflux disease)    H/O iron deficiency anemia    with  hemmorrhoids   Hyperlipidemia    Hypertension    Left bundle branch block     chronic, negative Myoview a normal echo as noted above    Osteoarthritis    with bilateral hip surgeries and multiple complication, with the most recent  surgery in January 2012   Scrotal edema    Type 2 diabetes mellitus without complications ([place]) 93/26/7124   Vitamin D deficiency     Past Surgical History:  Procedure Laterality Date   Cardiology Nuclear Med Study   04 24 2014   overall impression ; NORMAL  STRESS  NUCLEAR STUDY   HIP SURGERY  2004   Venous Duplex  04 22 2014   LOWER EXTREMITY SWELLING -Impressions [name] ; This is a normal  bilateral  lower extremity venous duplex Doppler evaluation    There were no vitals filed for this visit.   Subjective Assessment - 12/24/20 1829     Subjective Patient arrives accompanied by his wife. They report another fall over the weekend, he slid off the side of the bed due to wearing nylon socks. he repots continued pain in L hip from previous fall, stating he had to sleep on a pillow under his R hip. He says it feel smuch better today.    Patient is accompained by: Family member    Pertinent History DJD, Depression, DDD    Currently in Pain? No/denies                [facility] PT Assessment - 12/24/20 0001       AROM   Right/Left Hip Right    Right Hip Extension 0    Right Hip Flexion 95    Right Hip External Rotation  25    Right Hip Internal Rotation  25    Right Hip ABduction 30      Strength   Left Hip Flexion 3-/5    Left Hip Extension 3/5    Left Hip ABduction 3-/5  Palpation   Palpation comment Fatty nodule noted on R lateral hip. TTP, but it appears to be associated with THR performed approximately 17 years ago.                           [facility] Adult PT Treatment/Exercise - 12/24/20 0001       Ambulation/Gait   Ambulation/Gait Yes    Ambulation/Gait Assistance 6: Modified independent (Device/Increase time)    Ambulation Distance (Feet) 100 Feet    Assistive device R Axillary Crutch    Gait Pattern Step-to pattern;Decreased stance time - left;Decreased step length - right;Decreased stride length;Decreased hip/knee flexion - left;Decreased weight shift to left    Ambulation Surface Level;Indoor      Knee/Hip Exercises: Aerobic   Nustep L4 x 2 minutes. 2 x 45 seconds at L 6 resistance with 40 seocnds rest. Declined to go further due to fatigue.                     PT Education - 12/24/20 1845     Education Details Patient strongly encouraged to walk with RW rather than using a cane. Encouraged him to modify his gym  routine, attempting new activities, incluidng possibly aquatic exercises, as well as group activities to improve his strength and provide social contet to his workouts.    Person(s) Educated Patient;Spouse    Methods Explanation    Comprehension Verbalized understanding              PT Short Term Goals - 12/24/20 1854       PT SHORT TERM GOAL #1   Title Patient will perform basic HEP with MI, using written instructions.    Time 1    Period Weeks    Status On-going    Target Date 01/07/21               PT Long Term Goals - 12/24/20 1855       PT LONG TERM GOAL #1   Title Improve L hip strenght to 3/5    Baseline 2+-(3-)/5    Time 2    Period Weeks    Status On-going    Target Date 01/07/21      PT LONG TERM GOAL #2   Title Patient will perform advanced HEP MI with written instructions    Baseline Initiated    Time 4    Period Weeks    Status On-going    Target Date 01/02/21      PT LONG TERM GOAL #3   Title ambualte x at least 500' with LRAD, MI, including uneven surfaces.    Baseline 100'with crutch on R. Improved stability, but therapsit recommending RW.    Time 2    Period Weeks    Status On-going    Target Date 01/07/21      PT LONG TERM GOAL #4   Title Up and down 4 steps using U rail, with step to gait pattern, S    Baseline CGA, mostly steady with rail, but increased difficulty with 6" step.    Time 4    Period Weeks    Status On-going    Target Date 01/07/21      PT LONG TERM GOAL #5   Title Patient will demosntrate improved weight hsift onto LLE in gait iwth longer step length and equal stance time through each LE.    Baseline Step to gait with decreased WB through LLE.  Period Weeks    Status On-going    Target Date 01/07/21                   Plan - 12/24/20 1848     Clinical Impression Statement Patent's wife called therapist after his last visit, voicing concerns regarding his cognition as well as his safety in moiblity  due to repeated falls. She reports that he refuses to use his RW, and she thinks he is forgetting any instruction received in PT. She arrived with paiten tto accompany him to treatment today. Therapsit assessed patient's R hip due to continued C/O pain after a fall nearly 2 weeks ago. He demosntrated mild TTP near greater torochanter of R femur, very tight muscles all around his R hip, weakness. Therapsit noted that paitent's treatment plan was approaching the end. Made plan to update jexercise porgram for him to be able ot continue at the gym at the beginning of the year. Encouraged him to explore alternate gyms and workout porgrams, including aquatic therapy, group exercise. he verbalized understanding and agreement. Therapsi talso strongly encouraged him to use his RW, which he agreed to do. His wife was present for all recommendations.    Personal Factors and Comorbidities Age;Comorbidity 3+    Comorbidities leg length discrepancy, arthritis, DDD, L BBB    Examination-Activity Limitations Bathing;Locomotion Level;Transfers;Bend;Sleep;Squat;Stairs;Stand;Lift    Stability/Clinical Decision Making Stable/Uncomplicated    Clinical Decision Making Low    Rehab Potential Good    PT Frequency 2x / week    PT Duration 2 weeks    PT Treatment/Interventions ADLs/Self Care Home Management;Electrical Stimulation;DME Instruction;Moist Heat;Iontophoresis 4mg /ml Dexamethasone;Gait training;Stair training;Functional mobility training;Therapeutic activities;Therapeutic exercise;Balance training;Neuromuscular re-education;Manual techniques;Patient/family education    PT Next Visit Plan focus on stength and func in clinic and hopefully as pt sees more beneifts it will motivate him at home    PT Home Exercise Plan Initiated    Consulted and Agree with Plan of Care Patient             Patient will benefit from skilled therapeutic intervention in order to improve the following deficits and impairments:  Abnormal  gait, Decreased coordination, Difficulty walking, Cardiopulmonary status limiting activity, Decreased endurance, Decreased activity tolerance, Decreased balance, Decreased mobility, Decreased strength, Impaired flexibility, Improper body mechanics  Visit Diagnosis: Unsteadiness on feet  Difficulty in walking, not elsewhere classified  Other abnormalities of gait and mobility  Muscle weakness (generalized)     Problem List Patient Active Problem List   Diagnosis Date Noted   Posterior vitreous detachment of both eyes 12/24/2020   Anemia of chronic disease 11/07/2020   Recurrent falls 11/07/2020   Pseudophakia of both eyes 10/16/2020   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 ([place]) 10/16/2020   Macular pucker, right eye 10/16/2020   Mild nonproliferative diabetic retinopathy of left eye ([place]) 10/16/2020   Anxiety state 01/04/2020   Benign prostatic hyperplasia without lower urinary tract symptoms 01/04/2020   Chronic diastolic heart failure ([place]) 01/04/2020   Diabetic renal disease ([place]) 01/04/2020   Diabetic retinopathy ([place]) 01/04/2020   ED (erectile dysfunction) of organic origin 01/04/2020   Hypertrophic cardiomyopathy ([place]) 01/04/2020   Insomnia 01/04/2020   Left bundle branch block 01/04/2020   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   Primary localized osteoarthritis of pelvic region and thigh 01/04/2020   Stage 3 chronic kidney disease ([place]) 01/04/2020   Vitamin B12 deficiency (non anemic) 01/04/2020   Recurrent major depression (  [place]) 01/04/2020   Severe episode of recurrent major depressive disorder, without psychotic features ([place]) 10/05/2018   Cognitive complaints with normal neuropsychological exam 03/11/2018   Referred otalgia of left ear 09/23/2016   Temporomandibular joint (TMJ) pain 09/23/2016   Cervicogenic headache 09/03/2016   Obstructive sleep apnea syndrome 11/15/2015   Memory  difficulties 10/05/2014   Abnormality of gait 10/05/2014   Intermittent explosive disorder 09/25/2014   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   Encounter for long-term (current) use of other medications 04/28/2013   Vitamin D Deficiency 04/28/2013   Sleep disorder -Epworth OSA scale = 15 06/02/2012   Anal fissure 08/09/2010   Septic arthritis of hip ([place]) 04/11/2010   HYPERLIPIDEMIA 01/11/2010   DEPRESSION 01/11/2010   HYPERTENSION 01/11/2010   GERD 01/11/2010   Depression 01/11/2010   Hyperlipidemia 01/11/2010   T2 NIDDM w/Stage 2 CKD 09/21/2008   ANEMIA, IRON DEFICIENCY 09/21/2008   Personal history of colonic polyps 09/21/2008   Anemia, iron deficiency 09/21/2008    Marcelina Morel, DPT 12/24/2020, 6:57 PM  [place]. [place], [place], [address] Phone: [phone]   Fax:  [phone]  Name: Gerald [name] MRN: [id number] Date of Birth: [birth year]

## 2020-12-24 NOTE — Assessment & Plan Note (Signed)
Patient has not been able to restart CPAP to this point yet CSME has improved OD after 1 injection of antivegF in this office October 2022

## 2020-12-27 ENCOUNTER — Other Ambulatory Visit: Payer: Self-pay

## 2020-12-27 ENCOUNTER — Encounter: Payer: Self-pay | Admitting: Physical Therapy

## 2020-12-27 ENCOUNTER — Ambulatory Visit: Payer: Medicare Other | Admitting: Physical Therapy

## 2020-12-27 DIAGNOSIS — R2689 Other abnormalities of gait and mobility: Secondary | ICD-10-CM | POA: Diagnosis not present

## 2020-12-27 DIAGNOSIS — R262 Difficulty in walking, not elsewhere classified: Secondary | ICD-10-CM

## 2020-12-27 DIAGNOSIS — R2681 Unsteadiness on feet: Secondary | ICD-10-CM | POA: Diagnosis not present

## 2020-12-27 DIAGNOSIS — M6281 Muscle weakness (generalized): Secondary | ICD-10-CM | POA: Diagnosis not present

## 2020-12-27 NOTE — Therapy (Signed)
[address]. [place], [place], [address] Phone: [phone]   Fax:  [phone]  Physical Therapy Treatment  Patient Details  Name: Gerald [name] MRN: [id number] Date of Birth: [birth year] No data recorded  Encounter Date: 12/27/2020   PT End of Session - 12/27/20 1624     Visit Number 15    Number of Visits 17    Date for PT Re-Evaluation 01/08/21    PT Start Time 9753    PT Stop Time 0051    PT Time Calculation (min) 35 min    Activity Tolerance Patient tolerated treatment well    Behavior During Therapy [facility] for tasks assessed/performed             Past Medical History:  Diagnosis Date   Abnormality of gait 10/05/2014   Anxiety    Bilateral edema of lower extremity    LE Dopplers 4/22 - no thrombus or thrombophelbitis, no reflux   Colon polyp    DDD (degenerative disc disease)    Depression    Diabetes mellitus without complication ([place])    controlled by diet   DJD (degenerative joint disease)    DOE (dyspnea on exertion)    Echo 04 /22/2014 - normal EF, mild LVH, grade 1 Diastolic Dysfunction; Lexiscan Myoview 04/28/12 - no ishcemia or infarction, LBBB septal wall motion; EF ~60%   Fatigue    GERD (gastroesophageal reflux disease)    H/O iron deficiency anemia    with  hemmorrhoids   Hyperlipidemia    Hypertension    Left bundle branch block     chronic, negative Myoview a normal echo as noted above    Osteoarthritis    with bilateral hip surgeries and multiple complication, with the most recent  surgery in January 2012   Scrotal edema    Type 2 diabetes mellitus without complications ([place]) 10/27/1171   Vitamin D deficiency     Past Surgical History:  Procedure Laterality Date   Cardiology Nuclear Med Study   04 24 2014   overall impression ; NORMAL  STRESS  NUCLEAR STUDY   HIP SURGERY  2004   Venous Duplex  04 22 2014   LOWER EXTREMITY SWELLING -Impressions [name] ; This is a normal  bilateral  lower extremity venous duplex Doppler evaluation    There were no vitals filed for this visit.   Subjective Assessment - 12/27/20 1555     Subjective Patient reports no more falls. no C/O pain.    Pertinent History DJD, Depression, DDD    Patient Stated Goals Patient would like to get back to the gym, have more confidence, not need a RW.    Currently in Pain? No/denies                               [facility] Adult PT Treatment/Exercise - 12/27/20 0001       Knee/Hip Exercises: Aerobic   Nustep L4 x 4 minutes, 2 x 30 seconds at L7      Knee/Hip Exercises: Machines for Strengthening   Cybex Knee Extension BLE 15# 2 x 10    Cybex Knee Flexion 25# BLE 3 sets 10      Shoulder Exercises: Standing   Extension Strengthening;Both;20 reps;Theraband    Theraband Level (Shoulder Extension) Level 3 (Green)    Row Strengthening;Both;20 reps;Theraband    Theraband Level (Shoulder Row) Level 3 (Green)  PT Education - 12/27/20 1622     Education Details POC with impending D/C.    Person(s) Educated Patient    Methods Explanation    Comprehension Verbalized understanding              PT Short Term Goals - 12/27/20 1629       PT SHORT TERM GOAL #1   Title Patient will perform basic HEP with MI, using written instructions.    Time 1    Period Weeks    Status On-going    Target Date 01/07/21               PT Long Term Goals - 12/27/20 1629       PT LONG TERM GOAL #1   Title Improve L hip strenght to 3/5    Baseline 2+-(3-)/5    Time 2    Period Weeks    Status On-going    Target Date 01/07/21      PT LONG TERM GOAL #2   Title Patient will perform advanced HEP MI with written instructions    Baseline Initiated    Time 4    Period Weeks    Status On-going    Target Date 01/07/21      PT LONG TERM GOAL #3   Title ambualte x at least 500' with LRAD, MI, including uneven surfaces.    Baseline 100'with  crutch on R. Improved stability, but therapsit recommending RW.    Time 2    Period Weeks    Status On-going    Target Date 01/07/21      PT LONG TERM GOAL #4   Title Up and down 4 steps using U rail, with step to gait pattern, S    Baseline CGA, mostly steady with rail, but increased difficulty with 6" step.    Time 4    Period Weeks    Status On-going    Target Date 01/07/21      PT LONG TERM GOAL #5   Title Patient will demosntrate improved weight hsift onto LLE in gait iwth longer step length and equal stance time through each LE.    Baseline Patient uses crutch under RUE, decreasing weight shift onto LLE.    Period Weeks    Status Not Met    Target Date 01/07/21                   Plan - 12/27/20 1628     Clinical Impression Statement Patient arrived late for treatment session, but worked hard during treatment. He is approaching the end of his POC, discussed with him and encouraged him to return to the gym as planned.    Personal Factors and Comorbidities Age;Comorbidity 3+    Comorbidities leg length discrepancy, arthritis, DDD, L BBB    Examination-Activity Limitations Bathing;Locomotion Level;Transfers;Bend;Sleep;Squat;Stairs;Stand;Lift    Stability/Clinical Decision Making Stable/Uncomplicated    Rehab Potential Good    PT Frequency 2x / week    PT Duration 2 weeks    PT Treatment/Interventions ADLs/Self Care Home Management;Electrical Stimulation;DME Instruction;Moist Heat;Iontophoresis 37m/ml Dexamethasone;Gait training;Stair training;Functional mobility training;Therapeutic activities;Therapeutic exercise;Balance training;Neuromuscular re-education;Manual techniques;Patient/family education    PT Next Visit Plan focus on stength and func in clinic and hopefully as pt sees more beneifts it will motivate him at home    PT Home Exercise Plan Initiated    Consulted and Agree with Plan of Care Patient             Patient will  benefit from skilled therapeutic  intervention in order to improve the following deficits and impairments:  Abnormal gait, Decreased coordination, Difficulty walking, Cardiopulmonary status limiting activity, Decreased endurance, Decreased activity tolerance, Decreased balance, Decreased mobility, Decreased strength, Impaired flexibility, Improper body mechanics  Visit Diagnosis: Unsteadiness on feet  Difficulty in walking, not elsewhere classified  Other abnormalities of gait and mobility  Muscle weakness (generalized)     Problem List Patient Active Problem List   Diagnosis Date Noted   Posterior vitreous detachment of both eyes 12/24/2020   Anemia of chronic disease 11/07/2020   Recurrent falls 11/07/2020   Pseudophakia of both eyes 10/16/2020   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 ([place]) 10/16/2020   Macular pucker, right eye 10/16/2020   Mild nonproliferative diabetic retinopathy of left eye ([place]) 10/16/2020   Anxiety state 01/04/2020   Benign prostatic hyperplasia without lower urinary tract symptoms 01/04/2020   Chronic diastolic heart failure ([place]) 01/04/2020   Diabetic renal disease ([place]) 01/04/2020   Diabetic retinopathy ([place]) 01/04/2020   ED (erectile dysfunction) of organic origin 01/04/2020   Hypertrophic cardiomyopathy ([place]) 01/04/2020   Insomnia 01/04/2020   Left bundle branch block 01/04/2020   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   Primary localized osteoarthritis of pelvic region and thigh 01/04/2020   Stage 3 chronic kidney disease ([place]) 01/04/2020   Vitamin B12 deficiency (non anemic) 01/04/2020   Recurrent major depression ([place]) 01/04/2020   Severe episode of recurrent major depressive disorder, without psychotic features ([place]) 10/05/2018   Cognitive complaints with normal neuropsychological exam 03/11/2018   Referred otalgia of left ear 09/23/2016   Temporomandibular joint (TMJ) pain 09/23/2016   Cervicogenic  headache 09/03/2016   Obstructive sleep apnea syndrome 11/15/2015   Memory difficulties 10/05/2014   Abnormality of gait 10/05/2014   Intermittent explosive disorder 09/25/2014   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   Encounter for long-term (current) use of other medications 04/28/2013   Vitamin D Deficiency 04/28/2013   Sleep disorder -Epworth OSA scale = 15 06/02/2012   Anal fissure 08/09/2010   Septic arthritis of hip ([place]) 04/11/2010   HYPERLIPIDEMIA 01/11/2010   DEPRESSION 01/11/2010   HYPERTENSION 01/11/2010   GERD 01/11/2010   Depression 01/11/2010   Hyperlipidemia 01/11/2010   T2 NIDDM w/Stage 2 CKD 09/21/2008   ANEMIA, IRON DEFICIENCY 09/21/2008   Personal history of colonic polyps 09/21/2008   Anemia, iron deficiency 09/21/2008    Marcelina Morel, DPT 12/27/2020, 4:31 PM  [place] [place]. [place], [place], [address] Phone: [phone]   Fax:  [phone]  Name: Gerald [name] MRN: [id number] Date of Birth: Mar 23, 1945

## 2021-01-02 ENCOUNTER — Ambulatory Visit: Payer: Medicare Other | Admitting: Physical Therapy

## 2021-01-02 ENCOUNTER — Other Ambulatory Visit: Payer: Self-pay

## 2021-01-02 ENCOUNTER — Encounter: Payer: Self-pay | Admitting: Physical Therapy

## 2021-01-02 DIAGNOSIS — R2681 Unsteadiness on feet: Secondary | ICD-10-CM

## 2021-01-02 DIAGNOSIS — R2689 Other abnormalities of gait and mobility: Secondary | ICD-10-CM

## 2021-01-02 DIAGNOSIS — R262 Difficulty in walking, not elsewhere classified: Secondary | ICD-10-CM

## 2021-01-02 DIAGNOSIS — M6281 Muscle weakness (generalized): Secondary | ICD-10-CM

## 2021-01-02 NOTE — Therapy (Signed)
[address] [place], [place], [address] Phone: [phone]   Fax:  [phone]  Physical Therapy Treatment  Patient Details  Name: Gerald [name] MRN: [id number] Date of Birth: April 04, 1945 No data recorded  Encounter Date: 01/02/2021   PT End of Session - 01/02/21 1755     Visit Number 16    Number of Visits 17    Date for PT Re-Evaluation 01/08/21    PT Start Time 4081    PT Stop Time 1759    PT Time Calculation (min) 40 min    Activity Tolerance Patient tolerated treatment well    Behavior During Therapy [facility] for tasks assessed/performed             Past Medical History:  Diagnosis Date   Abnormality of gait 10/05/2014   Anxiety    Bilateral edema of lower extremity    LE Dopplers 4/22 - no thrombus or thrombophelbitis, no reflux   Colon polyp    DDD (degenerative disc disease)    Depression    Diabetes mellitus without complication ([place])    controlled by diet   DJD (degenerative joint disease)    DOE (dyspnea on exertion)    Echo 04 /22/2014 - normal EF, mild LVH, grade 1 Diastolic Dysfunction; Lexiscan Myoview 04/28/12 - no ishcemia or infarction, LBBB septal wall motion; EF ~60%   Fatigue    GERD (gastroesophageal reflux disease)    H/O iron deficiency anemia    with  hemmorrhoids   Hyperlipidemia    Hypertension    Left bundle branch block     chronic, negative Myoview a normal echo as noted above    Osteoarthritis    with bilateral hip surgeries and multiple complication, with the most recent  surgery in January 2012   Scrotal edema    Type 2 diabetes mellitus without complications ([place]) 44/81/8563   Vitamin D deficiency     Past Surgical History:  Procedure Laterality Date   Cardiology Nuclear Med Study   04 24 2014   overall impression ; NORMAL  STRESS  NUCLEAR STUDY   HIP SURGERY  2004   Venous Duplex  04 22 2014   LOWER EXTREMITY SWELLING -Impressions [name] ; This is a normal  bilateral  lower extremity venous duplex Doppler evaluation    There were no vitals filed for this visit.   Subjective Assessment - 01/02/21 1723     Subjective Patient arrives using a quad cane today. He reports another fall on Friday. He was walking to the bathroom in the middle of the night. He avoided turning on the hall light and lost his balance, with his feet getting tangled.  He tried to lean against a door. The door was not fully closed and moved out of the way, with him falling to the floor. He reports no pain following the fall.    Patient is accompained by: Family member    Pertinent History DJD, Depression, DDD    Patient Stated Goals Patient would like to get back to the gym, have more confidence, not need a RW.    Currently in Pain? No/denies    Pain Onset More than a month ago                               [facility] Adult PT Treatment/Exercise - 01/02/21 0001       Knee/Hip Exercises: Aerobic  Nustep L5 x 5 minutes. Unable to push against higher resistance.      Knee/Hip Exercises: Machines for Strengthening   Cybex Knee Extension BLE 15# 3 x 10    Cybex Knee Flexion 25# BLE 3 sets 10    Other Machine Resisted gait 5#, 3 x in each direction                     PT Education - 01/02/21 1753     Education Details D/C plan.    Person(s) Educated Patient    Methods Explanation    Comprehension Verbalized understanding              PT Short Term Goals - 12/27/20 1629       PT SHORT TERM GOAL #1   Title Patient will perform basic HEP with MI, using written instructions.    Time 1    Period Weeks    Status On-going    Target Date 01/07/21               PT Long Term Goals - 12/27/20 1629       PT LONG TERM GOAL #1   Title Improve L hip strenght to 3/5    Baseline 2+-(3-)/5    Time 2    Period Weeks    Status On-going    Target Date 01/07/21      PT LONG TERM GOAL #2   Title Patient will perform advanced HEP MI  with written instructions    Baseline Initiated    Time 4    Period Weeks    Status On-going    Target Date 01/07/21      PT LONG TERM GOAL #3   Title ambualte x at least 500' with LRAD, MI, including uneven surfaces.    Baseline 100'with crutch on R. Improved stability, but therapsit recommending RW.    Time 2    Period Weeks    Status On-going    Target Date 01/07/21      PT LONG TERM GOAL #4   Title Up and down 4 steps using U rail, with step to gait pattern, S    Baseline CGA, mostly steady with rail, but increased difficulty with 6" step.    Time 4    Period Weeks    Status On-going    Target Date 01/07/21      PT LONG TERM GOAL #5   Title Patient will demosntrate improved weight hsift onto LLE in gait iwth longer step length and equal stance time through each LE.    Baseline Patient uses crutch under RUE, decreasing weight shift onto LLE.    Period Weeks    Status Not Met    Target Date 01/07/21                   Plan - 01/02/21 1758     Clinical Impression Statement Patient walked in using a quad cane. he reports another fall at home. He is still resistant to using a RW. Initiated D/C planing.    Personal Factors and Comorbidities Age;Comorbidity 3+    Comorbidities leg length discrepancy, arthritis, DDD, L BBB    Examination-Activity Limitations Bathing;Locomotion Level;Transfers;Bend;Sleep;Squat;Stairs;Stand;Lift    Stability/Clinical Decision Making Stable/Uncomplicated    Clinical Decision Making Low    Rehab Potential Good    PT Frequency 2x / week    PT Duration 2 weeks    PT Treatment/Interventions ADLs/Self Care Home Management;Electrical Stimulation;DME Instruction;Moist Heat;Iontophoresis  $'4mg'd$ /ml Dexamethasone;Gait training;Stair training;Functional mobility training;Therapeutic activities;Therapeutic exercise;Balance training;Neuromuscular re-education;Manual techniques;Patient/family education    PT Next Visit Plan focus on stength and func in  clinic and hopefully as pt sees more beneifts it will motivate him at home    PT Home Exercise Plan Initiated    Consulted and Agree with Plan of Care Patient             Patient will benefit from skilled therapeutic intervention in order to improve the following deficits and impairments:  Abnormal gait, Decreased coordination, Difficulty walking, Cardiopulmonary status limiting activity, Decreased endurance, Decreased activity tolerance, Decreased balance, Decreased mobility, Decreased strength, Impaired flexibility, Improper body mechanics  Visit Diagnosis: Unsteadiness on feet  Difficulty in walking, not elsewhere classified  Other abnormalities of gait and mobility  Muscle weakness (generalized)     Problem List Patient Active Problem List   Diagnosis Date Noted   Posterior vitreous detachment of both eyes 12/24/2020   Anemia of chronic disease 11/07/2020   Recurrent falls 11/07/2020   Pseudophakia of both eyes 10/16/2020   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 ([place]) 10/16/2020   Macular pucker, right eye 10/16/2020   Mild nonproliferative diabetic retinopathy of left eye ([place]) 10/16/2020   Anxiety state 01/04/2020   Benign prostatic hyperplasia without lower urinary tract symptoms 01/04/2020   Chronic diastolic heart failure ([place]) 01/04/2020   Diabetic renal disease ([place]) 01/04/2020   Diabetic retinopathy ([place]) 01/04/2020   ED (erectile dysfunction) of organic origin 01/04/2020   Hypertrophic cardiomyopathy ([place]) 01/04/2020   Insomnia 01/04/2020   Left bundle branch block 01/04/2020   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   Primary localized osteoarthritis of pelvic region and thigh 01/04/2020   Stage 3 chronic kidney disease ([place]) 01/04/2020   Vitamin B12 deficiency (non anemic) 01/04/2020   Recurrent major depression ([place]) 01/04/2020   Severe episode of recurrent major depressive disorder,  without psychotic features ([place]) 10/05/2018   Cognitive complaints with normal neuropsychological exam 03/11/2018   Referred otalgia of left ear 09/23/2016   Temporomandibular joint (TMJ) pain 09/23/2016   Cervicogenic headache 09/03/2016   Obstructive sleep apnea syndrome 11/15/2015   Memory difficulties 10/05/2014   Abnormality of gait 10/05/2014   Intermittent explosive disorder 09/25/2014   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   Encounter for long-term (current) use of other medications 04/28/2013   Vitamin D Deficiency 04/28/2013   Sleep disorder -Epworth OSA scale = 15 06/02/2012   Anal fissure 08/09/2010   Septic arthritis of hip ([place]) 04/11/2010   HYPERLIPIDEMIA 01/11/2010   DEPRESSION 01/11/2010   HYPERTENSION 01/11/2010   GERD 01/11/2010   Depression 01/11/2010   Hyperlipidemia 01/11/2010   T2 NIDDM w/Stage 2 CKD 09/21/2008   ANEMIA, IRON DEFICIENCY 09/21/2008   Personal history of colonic polyps 09/21/2008   Anemia, iron deficiency 09/21/2008    Marcelina Morel, DPT 01/02/2021, 6:00 PM  [address] [place], [place], [address] Phone: [phone]   Fax:  [phone]  Name: Gerald [name] MRN: [id number] Date of Birth: 22-Sep-1945

## 2021-01-03 ENCOUNTER — Encounter: Payer: Self-pay | Admitting: Physical Therapy

## 2021-01-03 ENCOUNTER — Ambulatory Visit: Payer: Medicare Other | Admitting: Physical Therapy

## 2021-01-03 DIAGNOSIS — R2689 Other abnormalities of gait and mobility: Secondary | ICD-10-CM

## 2021-01-03 DIAGNOSIS — R2681 Unsteadiness on feet: Secondary | ICD-10-CM | POA: Diagnosis not present

## 2021-01-03 DIAGNOSIS — M6281 Muscle weakness (generalized): Secondary | ICD-10-CM

## 2021-01-03 DIAGNOSIS — R262 Difficulty in walking, not elsewhere classified: Secondary | ICD-10-CM | POA: Diagnosis not present

## 2021-01-03 NOTE — Therapy (Signed)
[address] [place], [place], [address] Phone: [phone]   Fax:  [phone]  Physical Therapy Treatment PHYSICAL THERAPY DISCHARGE SUMMARY  Visits from Start of Care: 17  Current functional level related to goals / functional outcomes: Patient ambulates using a crutch, therapist recommends RW, but patient states he does not have room in his home.   Remaining deficits: R LE weakness, instability, decreased balance.   Education / Equipment: Educated to use a RW, but declines stating he cannot use it.   Patient agrees to discharge. Patient goals were partially met. Patient is being discharged due to maximized rehab potential.   Patient Details  Name: Gerald [name] MRN: [id number] Date of Birth: [birth year] No data recorded  Encounter Date: 01/03/2021   PT End of Session - 01/03/21 1545     Visit Number 17    Number of Visits 17    Date for PT Re-Evaluation 01/08/21    PT Start Time 1539    PT Stop Time 1618    PT Time Calculation (min) 39 min    Activity Tolerance Patient tolerated treatment well    Behavior During Therapy [facility] for tasks assessed/performed             Past Medical History:  Diagnosis Date   Abnormality of gait 10/05/2014   Anxiety    Bilateral edema of lower extremity    LE Dopplers 4/22 - no thrombus or thrombophelbitis, no reflux   Colon polyp    DDD (degenerative disc disease)    Depression    Diabetes mellitus without complication ([place])    controlled by diet   DJD (degenerative joint disease)    DOE (dyspnea on exertion)    Echo 04 /22/2014 - normal EF, mild LVH, grade 1 Diastolic Dysfunction; Lexiscan Myoview 04/28/12 - no ishcemia or infarction, LBBB septal wall motion; EF ~60%   Fatigue    GERD (gastroesophageal reflux disease)    H/O iron deficiency anemia    with  hemmorrhoids   Hyperlipidemia    Hypertension    Left bundle branch block     chronic, negative  Myoview a normal echo as noted above    Osteoarthritis    with bilateral hip surgeries and multiple complication, with the most recent  surgery in January 2012   Scrotal edema    Type 2 diabetes mellitus without complications ([place]) 07/06/1599   Vitamin D deficiency     Past Surgical History:  Procedure Laterality Date   Cardiology Nuclear Med Study   04 24 2014   overall impression ; NORMAL  STRESS  NUCLEAR STUDY   HIP SURGERY  2004   Venous Duplex  04 22 2014   LOWER EXTREMITY SWELLING -Impressions [name] ; This is a normal bilateral  lower extremity venous duplex Doppler evaluation    There were no vitals filed for this visit.   Subjective Assessment - 01/03/21 1541     Subjective Patient arrives walking with one crutch. He reports that this is his preferred AD for gait.    Pertinent History DJD, Depression, DDD    Patient Stated Goals Patient would like to get back to the gym, have more confidence, not need a RW.    Currently in Pain? No/denies                               [facility] Adult PT Treatment/Exercise - 01/03/21  0001       Knee/Hip Exercises: Aerobic   Nustep L5 x 5 minutes      Knee/Hip Exercises: Machines for Strengthening   Cybex Knee Extension BLE 15# 3 x 10    Cybex Knee Flexion 25# BLE 3 sets 10      Shoulder Exercises: Seated   Row Weight (lbs) 15    Row Limitations 2 x 10      Shoulder Exercises: ROM/Strengthening   Lat Pull 2 plate    Lat Pull Limitations 3 x 10 reps    Cybex Press 2 plate    Cybex Press Limitations 3x10                       PT Short Term Goals - 01/03/21 1542       PT SHORT TERM GOAL #1   Title Patient will perform basic HEP with MI, using written instructions.    Time 1    Period Weeks    Status Achieved    Target Date 01/07/21               PT Long Term Goals - 01/03/21 1543       PT LONG TERM GOAL #1   Title Improve L hip strenght to 3/5    Baseline 2+-(3-)/5    Time 2     Period Weeks    Status Not Met    Target Date 01/07/21      PT LONG TERM GOAL #2   Title Patient will perform advanced HEP MI with written instructions    Baseline Initiated    Time 4    Period Weeks    Status Achieved    Target Date 01/07/21      PT LONG TERM GOAL #3   Title ambualte x at least 500' with LRAD, MI, including uneven surfaces.    Baseline 100'with crutch on R. Improved stability, but therapsit recommending RW.    Time 2    Period Weeks    Status Not Met    Target Date 01/07/21      PT LONG TERM GOAL #4   Title Up and down 4 steps using U rail, with step to gait pattern, S    Baseline CGA, mostly steady with rail, but increased difficulty with 6" step.    Time 4    Period Weeks    Status Achieved    Target Date 01/07/21      PT LONG TERM GOAL #5   Title Patient will demosntrate improved weight hsift onto LLE in gait iwth longer step length and equal stance time through each LE.    Baseline Patient uses crutch under RUE, decreasing weight shift onto LLE.    Period Weeks    Status Not Met    Target Date 01/07/21                   Plan - 01/03/21 1545     Clinical Impression Statement Patient arrives using crutch under R arm. He reports that this is his preferred AD for gait. He appears fairly stable with the crutch. He reports that he cannot use the RW at home due to items on the floor. Educated to be extra cautious and look for obstacles to avoid falling. He has progressed some in therapy, but remains very weak, especially on outer hip on R. he continues to have occasional falls, often at night, but he has resisted changing his routine  for safety.    Personal Factors and Comorbidities Age;Comorbidity 3+    Comorbidities leg length discrepancy, arthritis, DDD, L BBB    Examination-Activity Limitations Bathing;Locomotion Level;Transfers;Bend;Sleep;Squat;Stairs;Stand;Lift    Stability/Clinical Decision Making Stable/Uncomplicated    Rehab Potential  Good    PT Frequency 2x / week    PT Duration 2 weeks    PT Treatment/Interventions ADLs/Self Care Home Management;Electrical Stimulation;DME Instruction;Moist Heat;Iontophoresis 4mg /ml Dexamethasone;Gait training;Stair training;Functional mobility training;Therapeutic activities;Therapeutic exercise;Balance training;Neuromuscular re-education;Manual techniques;Patient/family education    PT Next Visit Plan focus on stength and func in clinic and hopefully as pt sees more beneifts it will motivate him at home    PT Home Exercise Plan Initiated    Consulted and Agree with Plan of Care Patient             Patient will benefit from skilled therapeutic intervention in order to improve the following deficits and impairments:  Abnormal gait, Decreased coordination, Difficulty walking, Cardiopulmonary status limiting activity, Decreased endurance, Decreased activity tolerance, Decreased balance, Decreased mobility, Decreased strength, Impaired flexibility, Improper body mechanics  Visit Diagnosis: Unsteadiness on feet  Difficulty in walking, not elsewhere classified  Other abnormalities of gait and mobility  Muscle weakness (generalized)     Problem List Patient Active Problem List   Diagnosis Date Noted   Posterior vitreous detachment of both eyes 12/24/2020   Anemia of chronic disease 11/07/2020   Recurrent falls 11/07/2020   Pseudophakia of both eyes 10/16/2020   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 ([place]) 10/16/2020   Macular pucker, right eye 10/16/2020   Mild nonproliferative diabetic retinopathy of left eye ([place]) 10/16/2020   Anxiety state 01/04/2020   Benign prostatic hyperplasia without lower urinary tract symptoms 01/04/2020   Chronic diastolic heart failure ([place]) 01/04/2020   Diabetic renal disease ([place]) 01/04/2020   Diabetic retinopathy ([place]) 01/04/2020   ED (erectile dysfunction) of organic  origin 01/04/2020   Hypertrophic cardiomyopathy ([place]) 01/04/2020   Insomnia 01/04/2020   Left bundle branch block 01/04/2020   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   Primary localized osteoarthritis of pelvic region and thigh 01/04/2020   Stage 3 chronic kidney disease ([place]) 01/04/2020   Vitamin B12 deficiency (non anemic) 01/04/2020   Recurrent major depression ([place]) 01/04/2020   Severe episode of recurrent major depressive disorder, without psychotic features ([place]) 10/05/2018   Cognitive complaints with normal neuropsychological exam 03/11/2018   Referred otalgia of left ear 09/23/2016   Temporomandibular joint (TMJ) pain 09/23/2016   Cervicogenic headache 09/03/2016   Obstructive sleep apnea syndrome 11/15/2015   Memory difficulties 10/05/2014   Abnormality of gait 10/05/2014   Intermittent explosive disorder 09/25/2014   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   Encounter for long-term (current) use of other medications 04/28/2013   Vitamin D Deficiency 04/28/2013   Sleep disorder -Epworth OSA scale = 15 06/02/2012   Anal fissure 08/09/2010   Septic arthritis of hip ([place]) 04/11/2010   HYPERLIPIDEMIA 01/11/2010   DEPRESSION 01/11/2010   HYPERTENSION 01/11/2010   GERD 01/11/2010   Depression 01/11/2010   Hyperlipidemia 01/11/2010   T2 NIDDM w/Stage 2 CKD 09/21/2008   ANEMIA, IRON DEFICIENCY 09/21/2008   Personal history of colonic polyps 09/21/2008   Anemia, iron deficiency 09/21/2008    Marcelina Morel, DPT 01/03/2021, 4:22 PM  [address] [place], [place], [address] Phone: [phone]   Fax:  [phone]  Name: Delfino Lovett  KASHMERE STAFFA MRN: [id number] Date of Birth: 11-22-1945

## 2021-01-10 DIAGNOSIS — G4733 Obstructive sleep apnea (adult) (pediatric): Secondary | ICD-10-CM | POA: Diagnosis not present

## 2021-01-24 ENCOUNTER — Other Ambulatory Visit: Payer: Self-pay | Admitting: Neurology

## 2021-01-24 NOTE — Telephone Encounter (Signed)
Rx refilled.

## 2021-02-12 ENCOUNTER — Encounter: Payer: Self-pay | Admitting: Podiatry

## 2021-02-12 ENCOUNTER — Ambulatory Visit: Payer: Medicare Other | Admitting: Podiatry

## 2021-02-12 ENCOUNTER — Other Ambulatory Visit: Payer: Self-pay

## 2021-02-12 DIAGNOSIS — M79674 Pain in right toe(s): Secondary | ICD-10-CM | POA: Diagnosis not present

## 2021-02-12 DIAGNOSIS — E119 Type 2 diabetes mellitus without complications: Secondary | ICD-10-CM

## 2021-02-12 DIAGNOSIS — B351 Tinea unguium: Secondary | ICD-10-CM | POA: Diagnosis not present

## 2021-02-12 DIAGNOSIS — M79675 Pain in left toe(s): Secondary | ICD-10-CM

## 2021-02-12 DIAGNOSIS — E1122 Type 2 diabetes mellitus with diabetic chronic kidney disease: Secondary | ICD-10-CM | POA: Diagnosis not present

## 2021-02-12 DIAGNOSIS — L84 Corns and callosities: Secondary | ICD-10-CM

## 2021-02-12 DIAGNOSIS — M2170 Unequal limb length (acquired), unspecified site: Secondary | ICD-10-CM | POA: Diagnosis not present

## 2021-02-12 DIAGNOSIS — N183 Chronic kidney disease, stage 3 unspecified: Secondary | ICD-10-CM

## 2021-02-18 NOTE — Progress Notes (Signed)
ANNUAL DIABETIC FOOT EXAM  Subjective: Gerald [name] presents today for for annual diabetic foot examination, at risk foot care. Pt has h/o NIDDM with chronic kidney disease, and callus(es) bilaterally and painful thick toenails that are difficult to trim. Painful toenails interfere with ambulation. Aggravating factors include wearing enclosed shoe gear. Pain is relieved with periodic professional debridement. Painful calluses are aggravated when weightbearing with and without shoegear. Pain is relieved with periodic professional debridement..  Patient relates 11 year h/o diabetes.  Patient denies any h/o foot wounds.  Patient denies any numbness, tingling, burning, or pins/needle sensation in feet.  Patient does not monitor blood glucose daily. He states he checks it on occasion.  Risk factors:  recurrent falls, diabetes, foot deformity, CKD, hyperlipidemia, HTN, CHF.  [name], DO is patient's PCP. Last visit was January 10, 2021.  Past Medical History:  Diagnosis Date   Abnormality of gait 10/05/2014   Anxiety    Bilateral edema of lower extremity    LE Dopplers 4/22 - no thrombus or thrombophelbitis, no reflux   Colon polyp    DDD (degenerative disc disease)    Depression    Diabetes mellitus without complication ([name])    controlled by diet   DJD (degenerative joint disease)    DOE (dyspnea on exertion)    Echo 04 /22/2014 - normal EF, mild LVH, grade 1 Diastolic Dysfunction; Lexiscan Myoview 04/28/12 - no ishcemia or infarction, LBBB septal wall motion; EF ~60%   Fatigue    GERD (gastroesophageal reflux disease)    H/O iron deficiency anemia    with  hemmorrhoids   Hyperlipidemia    Hypertension    Left bundle branch block     chronic, negative Myoview a normal echo as noted above    Osteoarthritis    with bilateral hip surgeries and multiple complication, with the most recent  surgery in January 2012   Scrotal edema    Type 2 diabetes mellitus without  complications ([place]) 78/67/6720   Vitamin D deficiency    Patient Active Problem List   Diagnosis Date Noted   Posterior vitreous detachment of both eyes 12/24/2020   Anemia of chronic disease 11/07/2020   Recurrent falls 11/07/2020   Pseudophakia of both eyes 10/16/2020   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 ([place]) 10/16/2020   Macular pucker, right eye 10/16/2020   Mild nonproliferative diabetic retinopathy of left eye ([place]) 10/16/2020   Anxiety state 01/04/2020   Benign prostatic hyperplasia without lower urinary tract symptoms 01/04/2020   Chronic diastolic heart failure ([place]) 01/04/2020   Diabetic renal disease ([place]) 01/04/2020   Diabetic retinopathy ([place]) 01/04/2020   ED (erectile dysfunction) of organic origin 01/04/2020   Hypertrophic cardiomyopathy ([place]) 01/04/2020   Insomnia 01/04/2020   Left bundle branch block 01/04/2020   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   Primary localized osteoarthritis of pelvic region and thigh 01/04/2020   Stage 3 chronic kidney disease ([place]) 01/04/2020   Vitamin B12 deficiency (non anemic) 01/04/2020   Recurrent major depression ([place]) 01/04/2020   Severe episode of recurrent major depressive disorder, without psychotic features ([place]) 10/05/2018   Cognitive complaints with normal neuropsychological exam 03/11/2018   Referred otalgia of left ear 09/23/2016   Temporomandibular joint (TMJ) pain 09/23/2016   Cervicogenic headache 09/03/2016   Obstructive sleep apnea syndrome 11/15/2015   Memory difficulties 10/05/2014   Abnormality of gait 10/05/2014   Intermittent explosive disorder 09/25/2014   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   Encounter for long-term (current) use of other medications 04/28/2013   Vitamin D Deficiency 04/28/2013   Sleep disorder -Epworth OSA scale = 15 06/02/2012   Anal fissure 08/09/2010   Septic  arthritis of hip ([place]) 04/11/2010   HYPERLIPIDEMIA 01/11/2010   DEPRESSION 01/11/2010   HYPERTENSION 01/11/2010   GERD 01/11/2010   Depression 01/11/2010   Hyperlipidemia 01/11/2010   T2 NIDDM w/Stage 2 CKD 09/21/2008   ANEMIA, IRON DEFICIENCY 09/21/2008   Personal history of colonic polyps 09/21/2008   Anemia, iron deficiency 09/21/2008   Past Surgical History:  Procedure Laterality Date   Cardiology Nuclear Med Study   04 24 2014   overall impression ; NORMAL  STRESS  NUCLEAR STUDY   HIP SURGERY  2004   Venous Duplex  04 22 2014   LOWER EXTREMITY SWELLING -Impressions [name] ; This is a normal bilateral  lower extremity venous duplex Doppler evaluation   Current Outpatient Medications on File Prior to Visit  Medication Sig Dispense Refill   acetaminophen (TYLENOL) 325 MG tablet 1 tablet as needed     ALPRAZolam (XANAX) 0.25 MG tablet      amLODipine (NORVASC) 10 MG tablet      amLODipine (NORVASC) 10 MG tablet 1 tablet     ascorbic acid (VITAMIN C) 500 MG tablet 1 tablet     aspirin 81 MG tablet Take 81 mg by mouth daily.     atorvastatin (LIPITOR) 10 MG tablet      atorvastatin (LIPITOR) 10 MG tablet 1 tablet     bisoprolol-hydrochlorothiazide (ZIAC) 2.5-6.25 MG tablet      buPROPion (WELLBUTRIN XL) 150 MG 24 hr tablet TAKE 1 TABLET (150 MG TOTAL) BY MOUTH EVERY MORNING. 90 tablet 0   buPROPion (WELLBUTRIN XL) 300 MG 24 hr tablet      Cholecalciferol (VITAMIN D3) 5000 UNITS CAPS Take 5,000 Units by mouth daily.      ciprofloxacin (CILOXAN) 0.3 % ophthalmic solution SMARTSIG:In Eye(s)     dorzolamide (TRUSOPT) 2 % ophthalmic solution Place 1 drop into the right eye 2 (two) times daily.     doxazosin (CARDURA) 8 MG tablet Take 1 tablet (8 mg total) by mouth daily. 90 tablet 3   enalapril (VASOTEC) 20 MG tablet Take 20 mg by mouth 2 (two) times daily.     Ferrous Sulfate (IRON) 325 (65 FE) MG TABS Take 65 mg by mouth daily.      Fiber, Guar Gum, CHEW Chew 1 capsule by mouth  daily.     FLUoxetine (PROZAC) 40 MG capsule Take 1 capsule (40 mg total) by mouth daily. 90 capsule 0   FLUoxetine (PROZAC) 40 MG capsule 1 capsule in the morning     furosemide (LASIX) 20 MG tablet 1 tablet     Magnesium 250 MG TABS Take 1 tablet by mouth daily.     Melatonin 5 MG SUBL Place 5 mg under the tongue at bedtime as needed (sleep).      Multiple Vitamin (MULTIVITAMIN WITH MINERALS) TABS tablet Take 1 tablet by mouth daily.     Multiple Vitamins-Minerals (CENTRUM SILVER 50+MEN) TABS See admin instructions.     nystatin (MYCOSTATIN/NYSTOP) 100000 UNIT/GM POWD Apply 1 g topically 2 (two) times daily as needed (rash).     omeprazole (PRILOSEC) 40 MG capsule Take 1 capsule (40 mg total) by mouth daily. 90 capsule 1   polyethylene glycol (MIRALAX / GLYCOLAX) packet Take 17 g by mouth daily.  rivastigmine (EXELON) 1.5 MG capsule TAKE 1 CAPSULE(1.5 MG) BY MOUTH TWICE DAILY 180 capsule 0   Sennosides (EX-LAX PO) Take 1 tablet by mouth daily as needed (constipation).     vitamin B-12 (CYANOCOBALAMIN) 1000 MCG tablet Take 1,000 mcg by mouth daily.     vitamin C (ASCORBIC ACID) 500 MG tablet 1 tablet     ZINC OXIDE, TOPICAL, 10 % CREA Apply 1 application topically as needed (for rash).     No current facility-administered medications on file prior to visit.    No Known Allergies Social History   Occupational History   Occupation: Retired    Fish farm manager: [employer]  Tobacco Use   Smoking status: Former    Packs/day: 0.50    Years: 30.00    Pack years: 15.00    Types: Cigarettes    Quit date: 03/30/1983    Years since quitting: 37.9   Smokeless tobacco: Never  Substance and Sexual Activity   Alcohol use: Yes    Alcohol/week: 1.0 standard drink    Types: 1 Standard drinks or equivalent per week    Comment: couple times a year   Drug use: No   Sexual activity: Not Currently   Family History  Problem Relation Age of Onset   Dementia Mother    Hypertension Mother     Heart disease Mother    Diabetes Mother    Heart attack Maternal Grandmother    Diabetes Maternal Grandfather    Immunization History  Administered Date(s) Administered   DTaP 01/07/2003   Influenza Split 10/14/2010, 09/21/2012   Influenza, High Dose Seasonal PF 10/27/2013, 10/05/2014, 09/13/2015   Influenza, Quadrivalent, Recombinant, Inj, Pf 10/02/2016, 09/10/2017, 10/15/2018, 10/03/2019   PFIZER(Purple Top)SARS-COV-2 Vaccination 02/12/2019, 03/05/2019   Pneumococcal Conjugate-13 10/24/2014   Pneumococcal Polysaccharide-23 06/16/2012   Tdap 01/07/2003   Zoster, Live 06/12/2011     Review of Systems: Negative except as noted in the HPI.   Objective: There were no vitals filed for this visit.  MARVIN GRABILL is a pleasant 76 y.o. male in NAD. AAO X 3.  Vascular Examination: CFT <3 seconds b/l LE. Faintly palpable DP pulse(s) RLE. Faintly palpable PT pulse(s) b/l LE. Diminished DP pulse(s) left lower extremity. Pedal hair absent. No pain with calf compression b/l. Lower extremity skin temperature gradient within normal limits. No edema noted b/l LE. No cyanosis or clubbing noted b/l LE.  Dermatological Examination: Pedal integument with normal turgor, texture and tone b/l LE. No open wounds b/l. No interdigital macerations b/l. Toenails 1-5 b/l elongated, thickened, discolored with subungual debris. +Tenderness with dorsal palpation of nailplates. Hyperkeratotic lesion(s) noted submet head 5 right foot.  Musculoskeletal Examination: Muscle strength 5/5 to all lower extremity muscle groups bilaterally. Limb length discrepancy of left lower extremity. Hammertoe deformity noted 2-5 b/l.  Footwear Assessment: Does the patient wear appropriate shoes? Yes. Does the patient need inserts/orthotics? Yes.  Neurological Examination: Protective sensation intact 5/5 intact bilaterally with 10g monofilament b/l. Vibratory sensation intact b/l.  Assessment: 1. Pain due to onychomycosis of  toenails of both feet   2. Callus   3. Acquired unequal limb length   4. Type 2 diabetes mellitus with stage 3 chronic kidney disease, without long-term current use of insulin, unspecified whether stage 3a or 3b CKD ([place])   5. Encounter for diabetic foot exam ([place])     ADA Risk Categorization: High Risk  Patient has one or more of the following: Loss of protective sensation Absent pedal pulses Severe Foot  deformity History of foot ulcer  Plan: -Diabetic foot examination performed today. -Continue foot and shoe inspections daily. Monitor blood glucose per PCP/Endocrinologist's recommendations. -Mycotic toenails 1-5 bilaterally were debrided in length and girth with sterile nail nippers and dremel without incident. -Callus(es) submet head 5 right foot pared utilizing sterile scalpel blade without complication or incident. Total number debrided =1. -Patient/POA to call should there be question/concern in the interim.  Return in about 3 months (around 05/12/2021).  Marzetta Board, DPM

## 2021-03-25 ENCOUNTER — Other Ambulatory Visit: Payer: Self-pay

## 2021-03-25 ENCOUNTER — Encounter (INDEPENDENT_AMBULATORY_CARE_PROVIDER_SITE_OTHER): Payer: Medicare Other | Admitting: Ophthalmology

## 2021-03-25 ENCOUNTER — Encounter (INDEPENDENT_AMBULATORY_CARE_PROVIDER_SITE_OTHER): Payer: Self-pay | Admitting: Ophthalmology

## 2021-03-25 ENCOUNTER — Ambulatory Visit (INDEPENDENT_AMBULATORY_CARE_PROVIDER_SITE_OTHER): Payer: Medicare Other | Admitting: Ophthalmology

## 2021-03-25 DIAGNOSIS — H35371 Puckering of macula, right eye: Secondary | ICD-10-CM

## 2021-03-25 DIAGNOSIS — E113211 Type 2 diabetes mellitus with mild nonproliferative diabetic retinopathy with macular edema, right eye: Secondary | ICD-10-CM | POA: Diagnosis not present

## 2021-03-25 DIAGNOSIS — E113292 Type 2 diabetes mellitus with mild nonproliferative diabetic retinopathy without macular edema, left eye: Secondary | ICD-10-CM | POA: Diagnosis not present

## 2021-03-25 NOTE — Assessment & Plan Note (Signed)
No active CSME OS °

## 2021-03-25 NOTE — Assessment & Plan Note (Signed)
The nature of diabetic macular edema was discussed with the patient. Treatment options were outlined including medical therapy, laser & vitrectomy. The use of injectable medications reviewed, including Avastin, Lucentis, and Eylea. Periodic injections into the eye are likely to resolve diabetic macular edema (swelling in the center of vision). Initially, injections are delivered are delivered every 4-6 weeks, and the interval extended as the condition improves. On average, 8-9 injections the first year, and 5 in year 2. Improvement in the condition most often improves on medical therapy. Occasional use of focal laser is also recommended for residual macular edema (swelling). Excellent control of blood glucose and blood pressure are encouraged under the care of a primary physician or endocrinologist. Similarly, attempts to maintain serum cholesterol, low density lipoproteins, and high-density lipoproteins in a favorable range were recommended.  ? ?OD, minor CME improved overall ?

## 2021-03-25 NOTE — Progress Notes (Signed)
? ? ?03/25/2021 ? ?  ? ?CHIEF COMPLAINT ?Patient presents for  ?Chief Complaint  ?Patient presents with  ? Diabetic Retinopathy with Macular Edema  ? ? ? ? ?HISTORY OF PRESENT ILLNESS: ?Gerald [name] is a 76 y.o. male who presents to the clinic today for:  ? ?HPI   ?3 mos for dilate ou, oct. ?Pt states no changes in vision. Pt sees floaters but denies FOL. ? ? ? ?Last edited by Silvestre Moment on 03/25/2021  1:51 PM.  ?  ? ? ?Referring physician: ?[name], DO ?[address] ?[address] ?[place],  [place] [id number] ? ?HISTORICAL INFORMATION:  ? ?Selected notes from the [facility] ?  ? ?Lab Results  ?Component Value Date  ? HGBA1C 5.6 06/24/2013  ?  ? ?CURRENT MEDICATIONS: ?Current Outpatient Medications (Ophthalmic Drugs)  ?Medication Sig  ? ciprofloxacin (CILOXAN) 0.3 % ophthalmic solution SMARTSIG:In Eye(s)  ? dorzolamide (TRUSOPT) 2 % ophthalmic solution Place 1 drop into the right eye 2 (two) times daily.  ? ?No current facility-administered medications for this visit. (Ophthalmic Drugs)  ? ?Current Outpatient Medications (Other)  ?Medication Sig  ? acetaminophen (TYLENOL) 325 MG tablet 1 tablet as needed  ? ALPRAZolam (XANAX) 0.25 MG tablet   ? amLODipine (NORVASC) 10 MG tablet   ? amLODipine (NORVASC) 10 MG tablet 1 tablet  ? ascorbic acid (VITAMIN C) 500 MG tablet 1 tablet  ? aspirin 81 MG tablet Take 81 mg by mouth daily.  ? atorvastatin (LIPITOR) 10 MG tablet   ? atorvastatin (LIPITOR) 10 MG tablet 1 tablet  ? bisoprolol-hydrochlorothiazide (ZIAC) 2.5-6.25 MG tablet   ? buPROPion (WELLBUTRIN XL) 150 MG 24 hr tablet TAKE 1 TABLET (150 MG TOTAL) BY MOUTH EVERY MORNING.  ? buPROPion (WELLBUTRIN XL) 300 MG 24 hr tablet   ? Cholecalciferol (VITAMIN D3) 5000 UNITS CAPS Take 5,000 Units by mouth daily.   ? doxazosin (CARDURA) 8 MG tablet Take 1 tablet (8 mg total) by mouth daily.  ? enalapril (VASOTEC) 20 MG tablet Take 20 mg by mouth 2 (two) times daily.  ? Ferrous Sulfate (IRON) 325 (65 FE) MG TABS Take 65  mg by mouth daily.   ? Fiber, Guar Gum, CHEW Chew 1 capsule by mouth daily.  ? FLUoxetine (PROZAC) 40 MG capsule Take 1 capsule (40 mg total) by mouth daily.  ? FLUoxetine (PROZAC) 40 MG capsule 1 capsule in the morning  ? furosemide (LASIX) 20 MG tablet 1 tablet  ? Magnesium 250 MG TABS Take 1 tablet by mouth daily.  ? Melatonin 5 MG SUBL Place 5 mg under the tongue at bedtime as needed (sleep).   ? Multiple Vitamin (MULTIVITAMIN WITH MINERALS) TABS tablet Take 1 tablet by mouth daily.  ? Multiple Vitamins-Minerals (CENTRUM SILVER 50+MEN) TABS See admin instructions.  ? nystatin (MYCOSTATIN/NYSTOP) 100000 UNIT/GM POWD Apply 1 g topically 2 (two) times daily as needed (rash).  ? omeprazole (PRILOSEC) 40 MG capsule Take 1 capsule (40 mg total) by mouth daily.  ? polyethylene glycol (MIRALAX / GLYCOLAX) packet Take 17 g by mouth daily.  ? rivastigmine (EXELON) 1.5 MG capsule TAKE 1 CAPSULE(1.5 MG) BY MOUTH TWICE DAILY  ? Sennosides (EX-LAX PO) Take 1 tablet by mouth daily as needed (constipation).  ? vitamin B-12 (CYANOCOBALAMIN) 1000 MCG tablet Take 1,000 mcg by mouth daily.  ? vitamin C (ASCORBIC ACID) 500 MG tablet 1 tablet  ? ZINC OXIDE, TOPICAL, 10 % CREA Apply 1 application topically as needed (for rash).  ? ?No  current facility-administered medications for this visit. (Other)  ? ? ? ? ?REVIEW OF SYSTEMS: ?ROS   ?Negative for: Constitutional, Gastrointestinal, Neurological, Skin, Genitourinary, Musculoskeletal, HENT, Endocrine, Cardiovascular, Eyes, Respiratory, Psychiatric, Allergic/Imm, Heme/Lymph ?Last edited by Silvestre Moment on 03/25/2021  1:51 PM.  ?  ? ? ? ?ALLERGIES ?No Known Allergies ? ?PAST MEDICAL HISTORY ?Past Medical History:  ?Diagnosis Date  ? Abnormality of gait 10/05/2014  ? Anxiety   ? Bilateral edema of lower extremity   ? LE Dopplers 4/22 - no thrombus or thrombophelbitis, no reflux  ? Colon polyp   ? DDD (degenerative disc disease)   ? Depression   ? Diabetes mellitus without complication ([place])   ?  controlled by diet  ? DJD (degenerative joint disease)   ? DOE (dyspnea on exertion)   ? Echo 04 /22/2014 - normal EF, mild LVH, grade 1 Diastolic Dysfunction; Lexiscan Myoview 04/28/12 - no ishcemia or infarction, LBBB septal wall motion; EF ~60%  ? Fatigue   ? GERD (gastroesophageal reflux disease)   ? H/O iron deficiency anemia   ? with  hemmorrhoids  ? Hyperlipidemia   ? Hypertension   ? Left bundle branch block   ?  chronic, negative Myoview a normal echo as noted above   ? Osteoarthritis   ? with bilateral hip surgeries and multiple complication, with the most recent  surgery in January 2012  ? Scrotal edema   ? Type 2 diabetes mellitus without complications ([place]) 50/27/7412  ? Vitamin D deficiency   ? ?Past Surgical History:  ?Procedure Laterality Date  ? Cardiology Nuclear Med Study   04 24 2014  ? overall impression ; NORMAL  STRESS  NUCLEAR STUDY  ? HIP SURGERY  2004  ? Venous Duplex  04 22 2014  ? LOWER EXTREMITY SWELLING -Impressions [name] ; This is a normal bilateral  lower extremity venous duplex Doppler evaluation  ? ? ?FAMILY HISTORY ?Family History  ?Problem Relation Age of Onset  ? Dementia Mother   ? Hypertension Mother   ? Heart disease Mother   ? Diabetes Mother   ? Heart attack Maternal Grandmother   ? Diabetes Maternal Grandfather   ? ? ?SOCIAL HISTORY ?Social History  ? ?Tobacco Use  ? Smoking status: Former  ?  Packs/day: 0.50  ?  Years: 30.00  ?  Pack years: 15.00  ?  Types: Cigarettes  ?  Quit date: 03/30/1983  ?  Years since quitting: 38.0  ? Smokeless tobacco: Never  ?Substance Use Topics  ? Alcohol use: Yes  ?  Alcohol/week: 1.0 standard drink  ?  Types: 1 Standard drinks or equivalent per week  ?  Comment: couple times a year  ? Drug use: No  ? ?  ? ?  ? ?OPHTHALMIC EXAM: ? ?Base Eye Exam   ? ? Visual Acuity (ETDRS)   ? ?   Right Left  ? Dist [place] 20/50 -1 20/20 -2  ? Dist ph [place] 20/30   ? ?  ?  ? ? Tonometry (Tonopen, 2:00 PM)   ? ?   Right Left  ? Pressure 8 10  ? ?  ?  ? ? Pupils    ? ?   Dark Light Shape React APD  ? Right 2 2 Round Minimal None  ? Left 2 2 Round Minimal None  ? ?  ?  ? ? Visual Fields   ? ?   Left Right  ?  Full Full  ? ?  ?  ? ?  Extraocular Movement   ? ?   Right Left  ?  Full Full  ? ?  ?  ? ? Neuro/Psych   ? ? Oriented x3: Yes  ? Mood/Affect: Normal  ? ?  ?  ? ? Dilation   ? ? Both eyes: 1.0% Mydriacyl, 2.5% Phenylephrine @ 2:00 PM  ? ?  ?  ? ?  ? ?Slit Lamp and Fundus Exam   ? ? External Exam   ? ?   Right Left  ? External Normal Normal  ? ?  ?  ? ? Slit Lamp Exam   ? ?   Right Left  ? Lids/Lashes Normal Normal  ? Conjunctiva/Sclera White and quiet White and quiet  ? Cornea Clear Clear  ? Anterior Chamber Deep and quiet Deep and quiet  ? Iris Round and reactive Round and reactive  ? Lens Centered posterior chamber intraocular lens Centered posterior chamber intraocular lens  ? Anterior Vitreous Normal Normal  ? ?  ?  ? ? Fundus Exam   ? ?   Right Left  ? Posterior Vitreous Posterior vitreous detachment Posterior vitreous detachment  ? Disc Normal Normal  ? C/D Ratio 0.55 0.5  ? Macula Epiretinal membrane minor topographic distortion but no detectable clinical CME Normal  ? Vessels NPDR- Mild NPDR- Mild  ? Periphery Normal Normal  ? ?  ?  ? ?  ? ? ?IMAGING AND PROCEDURES  ?Imaging and Procedures for 03/25/21 ? ?OCT, Retina - OU - Both Eyes   ? ?   ?Right Eye ?Quality was good. Scan locations included subfoveal. Central Foveal Thickness: 402. Progression has improved. Findings include abnormal foveal contour.  ? ?Left Eye ?Quality was good. Scan locations included subfoveal. Central Foveal Thickness: 264. Progression has been stable. Findings include normal foveal contour.  ? 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  Patient continues on Trusopt only.  Is off bromfenac as CME, CSME has subsided slightly OD for now some 5 months post most recent injection and off of topical NSAID ? ?OS microaneurysms are seen on red for evaluation yet no CSME  noted OS at all ? ?  ? ? ?  ?  ? ?  ?ASSESSMENT/PLAN: ? 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 The nature of diabetic

## 2021-03-25 NOTE — Assessment & Plan Note (Signed)
Moderate epiretinal membrane, may account for the CME in the absence of other definable CSME or significant diabetic retinopathy ?

## 2021-03-26 DIAGNOSIS — E11319 Type 2 diabetes mellitus with unspecified diabetic retinopathy without macular edema: Secondary | ICD-10-CM | POA: Diagnosis not present

## 2021-03-26 DIAGNOSIS — E119 Type 2 diabetes mellitus without complications: Secondary | ICD-10-CM | POA: Diagnosis not present

## 2021-03-26 DIAGNOSIS — E785 Hyperlipidemia, unspecified: Secondary | ICD-10-CM | POA: Diagnosis not present

## 2021-03-26 DIAGNOSIS — D638 Anemia in other chronic diseases classified elsewhere: Secondary | ICD-10-CM | POA: Diagnosis not present

## 2021-03-26 DIAGNOSIS — E559 Vitamin D deficiency, unspecified: Secondary | ICD-10-CM | POA: Diagnosis not present

## 2021-03-26 DIAGNOSIS — I129 Hypertensive chronic kidney disease with stage 1 through stage 4 chronic kidney disease, or unspecified chronic kidney disease: Secondary | ICD-10-CM | POA: Diagnosis not present

## 2021-03-27 DIAGNOSIS — E119 Type 2 diabetes mellitus without complications: Secondary | ICD-10-CM | POA: Diagnosis not present

## 2021-04-01 ENCOUNTER — Other Ambulatory Visit: Payer: Self-pay

## 2021-04-01 ENCOUNTER — Encounter: Payer: Self-pay | Admitting: Neurology

## 2021-04-01 ENCOUNTER — Ambulatory Visit: Payer: Medicare Other | Admitting: Neurology

## 2021-04-01 VITALS — BP 128/65 | HR 77 | Ht 66.5 in | Wt 168.0 lb

## 2021-04-01 DIAGNOSIS — R269 Unspecified abnormalities of gait and mobility: Secondary | ICD-10-CM | POA: Diagnosis not present

## 2021-04-01 DIAGNOSIS — G3184 Mild cognitive impairment, so stated: Secondary | ICD-10-CM | POA: Diagnosis not present

## 2021-04-01 DIAGNOSIS — G4720 Circadian rhythm sleep disorder, unspecified type: Secondary | ICD-10-CM | POA: Diagnosis not present

## 2021-04-01 MED ORDER — MEMANTINE HCL 10 MG PO TABS: 10.0000 mg | ORAL_TABLET | Freq: Two times a day (BID) | ORAL | 11 refills | Status: DC
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
## 2021-04-02 DIAGNOSIS — Z0000 Encounter for general adult medical examination without abnormal findings: Secondary | ICD-10-CM | POA: Diagnosis not present

## 2021-04-02 DIAGNOSIS — Z23 Encounter for immunization: Secondary | ICD-10-CM | POA: Diagnosis not present

## 2021-04-02 DIAGNOSIS — G3184 Mild cognitive impairment, so stated: Secondary | ICD-10-CM | POA: Diagnosis not present

## 2021-04-02 DIAGNOSIS — E11319 Type 2 diabetes mellitus with unspecified diabetic retinopathy without macular edema: Secondary | ICD-10-CM | POA: Diagnosis not present

## 2021-04-02 DIAGNOSIS — Z1211 Encounter for screening for malignant neoplasm of colon: Secondary | ICD-10-CM | POA: Diagnosis not present

## 2021-04-02 DIAGNOSIS — N1831 Chronic kidney disease, stage 3a: Secondary | ICD-10-CM | POA: Diagnosis not present

## 2021-04-02 DIAGNOSIS — D631 Anemia in chronic kidney disease: Secondary | ICD-10-CM | POA: Diagnosis not present

## 2021-04-02 DIAGNOSIS — E119 Type 2 diabetes mellitus without complications: Secondary | ICD-10-CM | POA: Diagnosis not present

## 2021-04-02 DIAGNOSIS — I129 Hypertensive chronic kidney disease with stage 1 through stage 4 chronic kidney disease, or unspecified chronic kidney disease: Secondary | ICD-10-CM | POA: Diagnosis not present

## 2021-04-02 NOTE — Patient Instructions (Addendum)
Continue current medications  ?Start with Namenda 10 mg twice daily  ?Follow up with your PCP  ?Return in 1 year or sooner if worse  ?

## 2021-04-03 DIAGNOSIS — D631 Anemia in chronic kidney disease: Secondary | ICD-10-CM | POA: Diagnosis not present

## 2021-04-03 DIAGNOSIS — I129 Hypertensive chronic kidney disease with stage 1 through stage 4 chronic kidney disease, or unspecified chronic kidney disease: Secondary | ICD-10-CM | POA: Diagnosis not present

## 2021-04-03 DIAGNOSIS — N1832 Chronic kidney disease, stage 3b: Secondary | ICD-10-CM | POA: Diagnosis not present

## 2021-04-03 DIAGNOSIS — E559 Vitamin D deficiency, unspecified: Secondary | ICD-10-CM | POA: Diagnosis not present

## 2021-04-03 DIAGNOSIS — N179 Acute kidney failure, unspecified: Secondary | ICD-10-CM | POA: Diagnosis not present

## 2021-04-11 ENCOUNTER — Telehealth: Payer: Self-pay | Admitting: Neurology
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
## 2021-04-11 NOTE — Telephone Encounter (Signed)
Thank you Michelle.  

## 2021-04-11 NOTE — Telephone Encounter (Addendum)
The patient was recently seen for mild cognitive impairment, falls. If the family feels he is an unsafe driver, they may need to have a direct conversation about him surrendering the car keys. They may also request a driver re-evaluation directly from the [facility]. If a safety concern is reported, the DMV will call the driver in for a road test. ? ?I spoke to the patient's wife and daughter. They are going to talk with the patient about his driving privileges first to see if he will willingly stop. Also, provided the number to [facility] DMV [phone]). They will contact them for assistance, if needed. ?

## 2021-04-24 ENCOUNTER — Other Ambulatory Visit: Payer: Self-pay | Admitting: Neurology

## 2021-05-14 ENCOUNTER — Ambulatory Visit (INDEPENDENT_AMBULATORY_CARE_PROVIDER_SITE_OTHER): Payer: Self-pay | Admitting: Podiatry

## 2021-05-14 DIAGNOSIS — Z91199 Patient's noncompliance with other medical treatment and regimen due to unspecified reason: Secondary | ICD-10-CM

## 2021-05-14 NOTE — Progress Notes (Signed)
   Complete physical exam  Patient: Gerald [name]   DOB: 10/26/1998   76 y.o. Male  MRN: [id number]  Subjective:    No chief complaint on file.   Gerald [name] is a 76 y.o. male who presents today for a complete physical exam. She reports consuming a {diet types:17450} diet. {types:19826} She generally feels {DESC; WELL/FAIRLY WELL/POORLY:18703}. She reports sleeping {DESC; WELL/FAIRLY WELL/POORLY:18703}. She {does/does not:200015} have additional problems to discuss today.    Most recent fall risk assessment:    07/03/2021   10:42 AM  Fall Risk   Falls in the past year? 0  Number falls in past yr: 0  Injury with Fall? 0  Risk for fall due to : No Fall Risks  Follow up Falls evaluation completed     Most recent depression screenings:    07/03/2021   10:42 AM 05/24/2020   10:46 AM  PHQ 2/9 Scores  PHQ - 2 Score 0 0  PHQ- 9 Score 5     {VISON DENTAL STD PSA (Optional):27386}  {History (Optional):23778}  Patient Care Team: Jessup, Joy, NP as PCP - General (Nurse Practitioner)   Outpatient Medications Prior to Visit  Medication Sig   fluticasone (FLONASE) 50 MCG/ACT nasal spray Place 2 sprays into both nostrils in the morning and at bedtime. After 7 days, reduce to once daily.   norgestimate-ethinyl estradiol (SPRINTEC 28) 0.25-35 MG-MCG tablet Take 1 tablet by mouth daily.   Nystatin POWD Apply liberally to affected area 2 times per day   spironolactone (ALDACTONE) 100 MG tablet Take 1 tablet (100 mg total) by mouth daily.   No facility-administered medications prior to visit.    ROS        Objective:     There were no vitals taken for this visit. {Vitals History (Optional):23777}  Physical Exam   No results found for any visits on 08/08/21. {Show previous labs (optional):23779}    Assessment & Plan:    Routine Health Maintenance and Physical Exam  Immunization History  Administered Date(s) Administered   DTaP 01/09/1999, 03/07/1999,  05/16/1999, 01/30/2000, 08/15/2003   Hepatitis A 06/11/2007, 06/16/2008   Hepatitis B 10/27/1998, 12/04/1998, 05/16/1999   HiB (PRP-OMP) 01/09/1999, 03/07/1999, 05/16/1999, 01/30/2000   IPV 01/09/1999, 03/07/1999, 11/04/1999, 08/15/2003   Influenza,inj,Quad PF,6+ Mos 09/16/2013   Influenza-Unspecified 12/17/2011   MMR 11/03/2000, 08/15/2003   Meningococcal Polysaccharide 06/16/2011   Pneumococcal Conjugate-13 01/30/2000   Pneumococcal-Unspecified 05/16/1999, 07/30/1999   Tdap 06/16/2011   Varicella 11/04/1999, 06/11/2007    Health Maintenance  Topic Date Due   HIV Screening  Never done   Hepatitis C Screening  Never done   INFLUENZA VACCINE  08/06/2021   PAP-Cervical Cytology Screening  08/08/2021 (Originally 10/26/2019)   PAP SMEAR-Modifier  08/08/2021 (Originally 10/26/2019)   TETANUS/TDAP  08/08/2021 (Originally 06/15/2021)   HPV VACCINES  Discontinued   COVID-19 Vaccine  Discontinued    Discussed health benefits of physical activity, and encouraged her to engage in regular exercise appropriate for her age and condition.  Problem List Items Addressed This Visit   None Visit Diagnoses     Annual physical exam    -  Primary   Cervical cancer screening       Need for Tdap vaccination          No follow-ups on file.     Joy Jessup, NP   

## 2021-06-25 ENCOUNTER — Ambulatory Visit (INDEPENDENT_AMBULATORY_CARE_PROVIDER_SITE_OTHER): Payer: Medicare Other | Admitting: Ophthalmology

## 2021-06-25 ENCOUNTER — Encounter (INDEPENDENT_AMBULATORY_CARE_PROVIDER_SITE_OTHER): Payer: Self-pay | Admitting: Ophthalmology

## 2021-06-25 DIAGNOSIS — G4733 Obstructive sleep apnea (adult) (pediatric): Secondary | ICD-10-CM | POA: Diagnosis not present

## 2021-06-25 DIAGNOSIS — H35371 Puckering of macula, right eye: Secondary | ICD-10-CM | POA: Diagnosis not present

## 2021-06-25 DIAGNOSIS — H35071 Retinal telangiectasis, right eye: Secondary | ICD-10-CM

## 2021-06-25 DIAGNOSIS — H43813 Vitreous degeneration, bilateral: Secondary | ICD-10-CM

## 2021-06-25 DIAGNOSIS — E113292 Type 2 diabetes mellitus with mild nonproliferative diabetic retinopathy without macular edema, left eye: Secondary | ICD-10-CM | POA: Diagnosis not present

## 2021-06-25 DIAGNOSIS — E113211 Type 2 diabetes mellitus with mild nonproliferative diabetic retinopathy with macular edema, right eye: Secondary | ICD-10-CM

## 2021-06-25 MED ORDER — FLUORESCEIN SODIUM 10 % IV SOLN
500.0000 mg | INTRAVENOUS | Status: AC | PRN
  Administered 2021-06-25: 500 mg via INTRAVENOUS

## 2021-06-25 NOTE — Assessment & Plan Note (Signed)
This noncompliant does not use sleep device assistance

## 2021-06-25 NOTE — Assessment & Plan Note (Signed)
Minor not impactful on macular nor CME

## 2021-06-25 NOTE — Assessment & Plan Note (Signed)
Physiologic OU 

## 2021-06-25 NOTE — Assessment & Plan Note (Signed)
Rare parafoveal microaneurysms detected, but no active leakage in the macula left eye.  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

## 2021-06-25 NOTE — Assessment & Plan Note (Addendum)
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  Yet with late onset CME also confirmed by OCT.  This CME is also improving spontaneously over the last 8 months off of antivegF therapy.  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  Macular telangiectasis (MAC-TEL), or parafoveal telangiectasis is a condition of "unknown" cause.  Findings in or near the macula (center of vision) consist of microaneurysms (leaking small capillaries), often with leakage of fluid which in the active phase can impact fine discriminatory vision, and in some cases trigger profound scarring in the macula, with severe permanent vision loss.  Standard treatment is observation and periodic examinations to monitor for treatable complications.   The cause  of this condition is "unknown".  However, the practice of Dr. Zadie Rhine has discovered an association with sleep apnea with its nightly periods of low oxygen in the blood stream (hypoxia), retained carbon dioxide (hypercapnia), associated with transient nocturnal hypertensive episodes.   More recently, some patients also been found to have advanced lung disease, whether asthma or COPD, with similar findings.  Dr. Zadie Rhine has been evaluating the association of sleep apnea, nightly hypoxia, and Macular telangiectasis for over 18 years.  Most patients are found to be noncompliant with sleep apnea therapy or testing in the past.  Resumption of CPAP or similar therapy is strongly recommended if ordered in the past.  Upon review of risk factors or findings positive for sleep apnea, more formal, extensive sleep laboratory or home testing, may be recommended.  Numerous patients, proven to have MAC-TEL, have improved or resolved their ey condition promptly, within weeks, of the use of nighttime oxygen supplementation or continuous positive airway pressure (CPAP).

## 2021-06-25 NOTE — Progress Notes (Signed)
06/25/2021     CHIEF COMPLAINT Patient presents for  Chief Complaint  Patient presents with   Diabetic Retinopathy with Macular Edema      HISTORY OF PRESENT ILLNESS: Gerald [name] is a 76 y.o. male who presents to the clinic today for:   HPI   3 mos dilate OU, Color FP, OCT. Patient states vision is stable and unchanged since last visit. Denies any new floaters or FOL. Patient reports he has Dorzolamide but reports he does not use it. States he was using it in March, but stopped using it after that visit . LBS: unknown Last edited by Laurin Coder on 06/25/2021  2:02 PM.      Referring physician: Monna Fam, MD [place],  [place] [id number]  HISTORICAL INFORMATION:   Selected notes from the MEDICAL RECORD NUMBER    Lab Results  Component Value Date   HGBA1C 5.6 06/24/2013     CURRENT MEDICATIONS: Current Outpatient Medications (Ophthalmic Drugs)  Medication Sig   dorzolamide (TRUSOPT) 2 % ophthalmic solution Place 1 drop into the right eye 2 (two) times daily.   No current facility-administered medications for this visit. (Ophthalmic Drugs)   Current Outpatient Medications (Other)  Medication Sig   acetaminophen (TYLENOL) 325 MG tablet 1 tablet as needed   ALPRAZolam (XANAX) 0.25 MG tablet    amLODipine (NORVASC) 10 MG tablet 1 tablet   atorvastatin (LIPITOR) 10 MG tablet 1 tablet   bisoprolol-hydrochlorothiazide (ZIAC) 2.5-6.25 MG tablet    buPROPion (WELLBUTRIN XL) 150 MG 24 hr tablet TAKE 1 TABLET (150 MG TOTAL) BY MOUTH EVERY MORNING.   buPROPion (WELLBUTRIN XL) 300 MG 24 hr tablet    doxazosin (CARDURA) 8 MG tablet Take 1 tablet (8 mg total) by mouth daily.   enalapril (VASOTEC) 20 MG tablet Take 20 mg by mouth 2 (two) times daily.   FLUoxetine (PROZAC) 40 MG capsule Take 1 capsule (40 mg total) by mouth daily.   furosemide (LASIX) 20 MG tablet 1 tablet   Melatonin 5 MG SUBL Place 5 mg under the tongue at bedtime as needed  (sleep).    memantine (NAMENDA) 10 MG tablet Take 1 tablet (10 mg total) by mouth 2 (two) times daily.   Multiple Vitamins-Minerals (CENTRUM SILVER 50+MEN) TABS See admin instructions.   omeprazole (PRILOSEC) 40 MG capsule Take 1 capsule (40 mg total) by mouth daily.   rivastigmine (EXELON) 1.5 MG capsule TAKE 1 CAPSULE(1.5 MG) BY MOUTH TWICE DAILY   No current facility-administered medications for this visit. (Other)      REVIEW OF SYSTEMS: ROS   Negative for: Constitutional, Gastrointestinal, Neurological, Skin, Genitourinary, Musculoskeletal, HENT, Endocrine, Cardiovascular, Eyes, Respiratory, Psychiatric, Allergic/Imm, Heme/Lymph Last edited by Hurman Horn, MD on 06/25/2021  2:36 PM.       ALLERGIES No Known Allergies  PAST MEDICAL HISTORY Past Medical History:  Diagnosis Date   Abnormality of gait 10/05/2014   Anxiety    Bilateral edema of lower extremity    LE Dopplers 4/22 - no thrombus or thrombophelbitis, no reflux   Colon polyp    DDD (degenerative disc disease)    Depression    DJD (degenerative joint disease)    DOE (dyspnea on exertion)    Echo 04 /22/2014 - normal EF, mild LVH, grade 1 Diastolic Dysfunction; [place] 04/28/12 - no ishcemia or infarction, LBBB septal wall motion; EF ~60%   Fatigue    GERD (gastroesophageal reflux disease)    H/O iron  deficiency anemia    with  hemmorrhoids   Hyperlipidemia    Hypertension    Left bundle branch block     chronic, negative Myoview a normal echo as noted above    Osteoarthritis    with bilateral hip surgeries and multiple complication, with the most recent  surgery in January 2012   Scrotal edema    Type 2 diabetes mellitus without complications ([place]) 63/33/5456   Vitamin D deficiency    Past Surgical History:  Procedure Laterality Date   Cardiology Nuclear Med Study   04 24 2014   overall impression ; NORMAL  STRESS  NUCLEAR STUDY   HIP SURGERY  2004   Venous Duplex  04 22 2014   LOWER  EXTREMITY SWELLING -Impressions [name] ; This is a normal bilateral  lower extremity venous duplex Doppler evaluation    FAMILY HISTORY Family History  Problem Relation Age of Onset   Dementia Mother    Hypertension Mother    Heart disease Mother    Diabetes Mother    Heart attack Maternal Grandmother    Diabetes Maternal Grandfather     SOCIAL HISTORY Social History   Tobacco Use   Smoking status: Former    Packs/day: 0.50    Years: 30.00    Total pack years: 15.00    Types: Cigarettes    Quit date: 03/30/1983    Years since quitting: 38.2   Smokeless tobacco: Never  Substance Use Topics   Alcohol use: Yes    Alcohol/week: 1.0 standard drink of alcohol    Types: 1 Standard drinks or equivalent per week    Comment: couple times a year   Drug use: No         OPHTHALMIC EXAM:  Base Eye Exam     Visual Acuity (ETDRS)       Right Left   Dist [place] 20/50 -2 20/20   Dist ph [place] 20/30 +2     Correction: Glasses         Tonometry (Tonopen, 2:06 PM)       Right Left   Pressure 15 18         Pupils       Pupils Dark Light React APD   Right PERRL 2 2 Minimal None   Left PERRL 2 2 Minimal None         Extraocular Movement       Right Left    Full Full         Neuro/Psych     Oriented x3: Yes   Mood/Affect: Normal         Dilation     Both eyes: 1.0% Mydriacyl, 2.5% Phenylephrine @ 2:06 PM           Slit Lamp and Fundus Exam     External Exam       Right Left   External Normal Normal         Slit Lamp Exam       Right Left   Lids/Lashes Normal Normal   Conjunctiva/Sclera White and quiet White and quiet   Cornea Clear Clear   Anterior Chamber Deep and quiet Deep and quiet   Iris Round and reactive Round and reactive   Lens Centered posterior chamber intraocular lens Centered posterior chamber intraocular lens   Anterior Vitreous Normal Normal         Fundus Exam       Right Left   Posterior Vitreous Posterior  vitreous  detachment Posterior vitreous detachment   Disc Normal Normal   C/D Ratio 0.55 0.5   Macula Epiretinal membrane minor topographic distortion but no detectable clinical CME Normal   Vessels NPDR- Mild NPDR- Mild   Periphery Normal Normal            IMAGING AND PROCEDURES  Imaging and Procedures for 06/25/21  OCT, Retina - OU - Both Eyes       Right Eye Quality was good. Scan locations included subfoveal. Central Foveal Thickness: 387. Progression has improved. Findings include abnormal foveal contour.   Left Eye Quality was good. Scan locations included subfoveal. Central Foveal Thickness: 264. Progression has been stable. Findings include normal foveal contour.   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  Patient continues on Trusopt only.  Is off bromfenac as CME, CSME has subsided slightly OD for now some 8 months post most recent injection and off of topical NSAID  OS microaneurysms are seen on red for evaluation yet no CSME noted OS at all     Color Fundus Photography Optos - OU - Both Eyes       Right Eye Progression has no prior data. Disc findings include normal observations. Macula : geographic atrophy, microaneurysms.   Left Eye Progression has no prior data. Disc findings include normal observations. Macula : geographic atrophy, microaneurysms.   Notes Mild NPDR OU at most.  Rare microaneurysms evident in the para macular region left eye.     Fluorescein Angiography Optos (Transit OD)       Injection: 500 mg Fluorescein Sodium 10 %   Route: Intravenous   NDC: 6811-5726-20   Right Eye   Progression has improved. Early phase findings include leakage. Mid/Late phase findings include leakage, microaneurysm. Choroidal neovascularization is not present.   Left Eye Mid/Late phase findings include microaneurysm.   Notes Microaneurysms noted the parafoveal location OU.  OD however with telangiectatic vessels also  around the macular region.  Late onset CME centrally OD but without leakage on the optic nerve.  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  We will need to document extent of active leakage OD with CME on OCT  Slow improvement over time by OCT yet still active by FFA.  I do believe this may be a type of MAC-TEL as patient has documented OSA but is not compliant with CPAP use.  Nonetheless over last 8 months the degree of CME and thickening has diminished without specific antivegF therapy thus we will continue to observe OD    Obstructive sleep apnea syndrome This noncompliant does not use sleep device assistance  Mild nonproliferative diabetic retinopathy of left eye ([name]) Rare parafoveal microaneurysms detected, but no active leakage in the macula left eye.  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  Yet with late onset CME also confirmed by OCT.  This  CME is also improving spontaneously over the last 8 months off of antivegF therapy.  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  Macular telangiectasis (MAC-TEL), or parafoveal telangiectasis is a condition of "unknown" cause.  Findings in or near the macula (center of vision) consist of microaneurysms (leaking small capillaries), often with leakage of fluid which in the active  phase can impact fine discriminatory vision, and in some cases trigger profound scarring in the macula, with severe permanent vision loss.  Standard treatment is observation and periodic examinations to monitor for treatable complications.   The cause  of this condition is "unknown".  However, the practice of Dr. Zadie Rhine has discovered an association with sleep apnea with its nightly periods of low oxygen in the blood stream (hypoxia), retained carbon dioxide (hypercapnia), associated with transient nocturnal hypertensive episodes.   More recently, some patients also been found to have advanced lung disease, whether asthma or COPD, with similar findings.  Dr. Zadie Rhine has been evaluating the association of sleep apnea, nightly hypoxia, and Macular telangiectasis for over 18 years.  Most patients are found to be noncompliant with sleep apnea therapy or testing in the past.  Resumption of CPAP or similar therapy is strongly recommended if ordered in the past.  Upon review of risk factors or findings positive for sleep apnea, more formal, extensive sleep laboratory or home testing, may be recommended.  Numerous patients, proven to have MAC-TEL, have improved or resolved their ey condition promptly, within weeks, of the use of nighttime oxygen supplementation or continuous positive airway pressure (CPAP).  Posterior vitreous detachment of both eyes Physiologic OU  Macular pucker, right eye Minor not impactful on macular nor CME     ICD-10-CM   1. 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 Obstructive sleep apnea syndrome  G47.33     3. Mild nonproliferative diabetic retinopathy of left eye associated with type 2 diabetes mellitus, macular edema  presence unspecified ([place])  X41.2878     4. 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 Posterior vitreous detachment of both eyes  H43.813     6. Macular pucker, right eye  H35.371       1.  OD overall the last 8 months, minor CME centrally for suspected CSME has not spontaneously improved without antivegF over the last 7 months.  This was treated elsewhere (Dr. Zigmund Daniel) for diabetic CSME with no resolution and continued poor vision.  His 20/200 vision that he presented with here in October has now steadily improved in the right eye to 20/30 as this is concomitant with no antivegF, injectable medicines delivered.  2.  OS persistent CME does raise a question of etiology.  Fluorescein angiography today perform confirms most likely diagnosis is type II MAC-TEL, with secondary CME.  This is often seen in my practice in conjunction with patients who have OSA undiagnosed or untreated.  This patient is untreated due to current noncompliance.  3.  The importance of treatment of OSA reviewed with the patient.  Discussed this with his daughter as well.  The CNS benefits of treatment of OSA in  terms of delaying aging processes and damages to the brain will be reviewed potentially due to slowing those physiologic changes with that occur with aging to the brain in addition to memory effects  4.  No specific diabetic retinopathy therapy is required at this time with only mild NPDR documented on widespread fluorescein angiography in each eye and no active macular edema active in the left eye and improving CME OD, and acuity  Ophthalmic Meds Ordered this visit:  Meds ordered this encounter  Medications   Fluorescein Sodium 10 % injection 500 mg       Return in about 3 months (around 09/25/2021) for DILATE OU, OCT.  There are no Patient Instructions on file for this visit.   Explained the diagnoses, plan, and follow up with the patient and they expressed understanding.  Patient  expressed understanding of the importance of proper follow up care.   Clent Demark Marietta Sikkema M.D. Diseases & Surgery of the Retina and Vitreous [facility]ina [facility] Diabetic [place] 06/25/21     Abbreviations: M myopia (nearsighted); A astigmatism; H hyperopia (farsighted); P presbyopia; Mrx spectacle prescription;  CTL contact lenses; OD right eye; OS left eye; OU both eyes  XT exotropia; ET esotropia; PEK punctate epithelial keratitis; PEE punctate epithelial erosions; DES dry eye syndrome; MGD meibomian gland dysfunction; ATs artificial tears; PFAT's preservative free artificial tears; [place] nuclear sclerotic cataract; PSC posterior subcapsular cataract; ERM epi-retinal membrane; PVD posterior vitreous detachment; RD retinal detachment; DM diabetes mellitus; DR diabetic retinopathy; NPDR non-proliferative diabetic retinopathy; PDR proliferative diabetic retinopathy; CSME clinically significant macular edema; DME diabetic macular edema; dbh dot blot hemorrhages; CWS cotton wool spot; POAG primary open angle glaucoma; C/D cup-to-disc ratio; HVF humphrey visual field; GVF goldmann visual field; OCT optical coherence tomography; IOP intraocular pressure; BRVO Branch retinal vein occlusion; CRVO central retinal vein occlusion; CRAO central retinal artery occlusion; BRAO branch retinal artery occlusion; RT retinal tear; SB scleral buckle; PPV pars plana vitrectomy; VH Vitreous hemorrhage; PRP panretinal laser photocoagulation; IVK intravitreal kenalog; VMT vitreomacular traction; MH Macular hole;  NVD neovascularization of the disc; NVE neovascularization elsewhere; AREDS age related eye disease study; ARMD age related macular degeneration; POAG primary open angle glaucoma; EBMD epithelial/anterior basement membrane dystrophy; ACIOL anterior chamber intraocular lens; IOL intraocular lens; PCIOL posterior chamber intraocular lens; Phaco/IOL phacoemulsification with intraocular lens placement; [place] photorefractive keratectomy;  LASIK laser assisted in situ keratomileusis; HTN hypertension; DM diabetes mellitus; COPD chronic obstructive pulmonary disease

## 2021-06-25 NOTE — Assessment & Plan Note (Addendum)
Overall improving over the 8 months yet still lingering.  We will need to document extent of active leakage OD with CME on OCT  Slow improvement over time by OCT yet still active by FFA.  I do believe this may be a type of MAC-TEL as patient has documented OSA but is not compliant with CPAP use.  Nonetheless over last 8 months the degree of CME and thickening has diminished without specific antivegF therapy thus we will continue to observe OD

## 2021-06-26 ENCOUNTER — Ambulatory Visit: Payer: Medicare Other | Admitting: Podiatry

## 2021-06-26 ENCOUNTER — Encounter: Payer: Self-pay | Admitting: Podiatry

## 2021-06-26 ENCOUNTER — Encounter: Payer: Self-pay | Admitting: Gastroenterology

## 2021-06-26 DIAGNOSIS — B351 Tinea unguium: Secondary | ICD-10-CM

## 2021-06-26 DIAGNOSIS — L84 Corns and callosities: Secondary | ICD-10-CM

## 2021-06-26 DIAGNOSIS — D631 Anemia in chronic kidney disease: Secondary | ICD-10-CM | POA: Insufficient documentation

## 2021-06-26 DIAGNOSIS — M79675 Pain in left toe(s): Secondary | ICD-10-CM

## 2021-06-26 DIAGNOSIS — N183 Chronic kidney disease, stage 3 unspecified: Secondary | ICD-10-CM | POA: Diagnosis not present

## 2021-06-26 DIAGNOSIS — E1122 Type 2 diabetes mellitus with diabetic chronic kidney disease: Secondary | ICD-10-CM

## 2021-06-26 DIAGNOSIS — M79674 Pain in right toe(s): Secondary | ICD-10-CM

## 2021-07-03 ENCOUNTER — Ambulatory Visit (AMBULATORY_SURGERY_CENTER): Payer: Self-pay | Admitting: *Deleted

## 2021-07-03 VITALS — Ht 66.5 in | Wt 154.0 lb

## 2021-07-03 DIAGNOSIS — Z1211 Encounter for screening for malignant neoplasm of colon: Secondary | ICD-10-CM

## 2021-07-03 MED ORDER — NA SULFATE-K SULFATE-MG SULF 17.5-3.13-1.6 GM/177ML PO SOLN: 1.0000 | Freq: Once | ORAL | 0 refills | Status: AC

## 2021-07-03 NOTE — Progress Notes (Signed)
No egg or soy allergy known to patient  No issues known to pt with past sedation with any surgeries or procedures Patient denies ever being told they had issues or difficulty with intubation  No FH of Malignant Hyperthermia Pt is not on diet pills Pt is not on  home 02  Pt is not on blood thinners  Pt states issues with constipation  no daily BM- hard stools - uses Miralax and Metamucil- and if takes regularly will have soft - extra miralax 7 days prior DAILY BM's every other day  No A fib or A flutter  Goodrx  Coupon to pt in PV today , Code to Pharmacy and  NO PA's for preps discussed with pt In PV today  Discussed with pt there will be an out-of-pocket cost for prep and that varies from $0 to 70 +  dollars - pt verbalized understanding  Pt instructed to use [email or web address] or GoodRx for a price reduction on prep

## 2021-07-04 NOTE — Progress Notes (Signed)
  Subjective:  Patient ID: Gerald [name], male    DOB: [birth year],  MRN: [id number]  Gerald [name] presents to clinic today for at risk foot care. Pt has h/o NIDDM with chronic kidney disease and callus(es) b/l lower extremities and painful thick toenails that are difficult to trim. Painful toenails interfere with ambulation. Aggravating factors include wearing enclosed shoe gear. Pain is relieved with periodic professional debridement. Painful calluses are aggravated when weightbearing with and without shoegear. Pain is relieved with periodic professional debridement.  Last A1c was 6%.  Patient does not monitor blood glucose daily.  New problem(s): None.   PCP is [name], DO , and last visit was April 02, 2021.  No Known Allergies  Review of Systems: Negative except as noted in the HPI.  Objective: No changes noted in today's physical examination.  [name] is a pleasant 76 y.o. male in NAD. AAO X 3.  Vascular Examination: CFT <3 seconds b/l LE. Faintly palpable DP pulse(s) RLE. Faintly palpable PT pulse(s) b/l LE. Diminished DP pulse(s) left lower extremity. Pedal hair absent. No pain with calf compression b/l. Lower extremity skin temperature gradient within normal limits. No edema noted b/l LE. No cyanosis or clubbing noted b/l LE.  Dermatological Examination: Pedal integument with normal turgor, texture and tone b/l LE. No open wounds b/l. No interdigital macerations b/l. Toenails 1-5 b/l elongated, thickened, discolored with subungual debris. +Tenderness with dorsal palpation of nailplates. Hyperkeratotic lesion(s) noted IPJ of R hallux and submet head 5 right foot   Musculoskeletal Examination: Muscle strength 5/5 to all lower extremity muscle groups bilaterally. Limb length discrepancy of left lower extremity. Hammertoe deformity noted 2-5 b/l.  Neurological Examination: Protective sensation intact 5/5 intact bilaterally with 10g monofilament b/l. Vibratory  sensation intact b/l.  Assessment/Plan: 1. Pain due to onychomycosis of toenails of both feet   2. Callus   3. Type 2 diabetes mellitus with stage 3 chronic kidney disease, without long-term current use of insulin, unspecified whether stage 3a or 3b CKD ([place])   -Patient was evaluated and treated. All patient's and/or POA's questions/concerns answered on today's visit. -No new findings. No new orders. -Toenails 1-5 b/l were debrided in length and girth with sterile nail nippers and dremel without iatrogenic bleeding.  -Callus(es) IPJ of R hallux and submet head 5 right foot pared utilizing sterile scalpel blade without complication or incident. Total number debrided =2. -Patient/POA to call should there be question/concern in the interim.   Return in about 3 months (around 09/26/2021).  Marzetta Board, DPM

## 2021-07-15 ENCOUNTER — Telehealth: Payer: Self-pay | Admitting: Neurology

## 2021-07-15 NOTE — Telephone Encounter (Signed)
I spoke to the patient's wife. She scheduled follow up on 07/23/21 at 11:15am. She will have him checked in at 10:45am.

## 2021-07-15 NOTE — Telephone Encounter (Signed)
Pt's wife, Yichen Gilardi (on [place])  getting argumentative , driving late at night, had a fall Saturday night, Pt said he is going blind, but eye doctor says eyesight is ok. Believe it is in his mind. Pt losing control of bodily function, do not take showers, combative. Would like a call from the nurse.

## 2021-07-15 NOTE — Telephone Encounter (Signed)
Please add patient to my schedule this week if possible, if not I will see them at my next available spot.   Thanks  [employer]

## 2021-07-22 ENCOUNTER — Encounter: Payer: Self-pay | Admitting: Gastroenterology

## 2021-07-23 ENCOUNTER — Ambulatory Visit: Payer: Medicare Other | Admitting: Neurology

## 2021-07-24 ENCOUNTER — Ambulatory Visit (AMBULATORY_SURGERY_CENTER): Payer: Medicare Other | Admitting: Gastroenterology

## 2021-07-24 ENCOUNTER — Encounter: Payer: Self-pay | Admitting: Gastroenterology

## 2021-07-24 VITALS — BP 152/71 | HR 82 | Temp 97.1°F | Resp 16 | Ht 66.0 in | Wt 154.0 lb

## 2021-07-24 DIAGNOSIS — F32A Depression, unspecified: Secondary | ICD-10-CM | POA: Diagnosis not present

## 2021-07-24 DIAGNOSIS — D124 Benign neoplasm of descending colon: Secondary | ICD-10-CM

## 2021-07-24 DIAGNOSIS — I10 Essential (primary) hypertension: Secondary | ICD-10-CM | POA: Diagnosis not present

## 2021-07-24 DIAGNOSIS — Z1211 Encounter for screening for malignant neoplasm of colon: Secondary | ICD-10-CM | POA: Diagnosis not present

## 2021-07-24 MED ORDER — SODIUM CHLORIDE 0.9 % IV SOLN: 500.0000 mL | Freq: Once | INTRAVENOUS | Status: DC

## 2021-07-24 NOTE — Progress Notes (Signed)
[facility] History and Physical   Primary Care Physician:  [name], DO   Reason for Procedure:   Colon cancer screening  Plan:    colonoscopy     HPI: Gerald [name] is a 76 y.o. male  here for colonoscopy screening - last exam 2012. Patient denies any bowel symptoms at this time aside from chronic constipation. No family history of colon cancer known. Otherwise feels well without any cardiopulmonary symptoms. I have discussed risks / benefits of the exam and anesthesia and they want to proceed.    Past Medical History:  Diagnosis Date   Abnormality of gait 10/05/2014   Anemia    Anxiety    Bilateral edema of lower extremity    LE Dopplers 4/22 - no thrombus or thrombophelbitis, no reflux   Blood transfusion without reported diagnosis    with surgery 2012   Cataract    removed both eyes   CHF (congestive heart failure) ([name])    Chronic kidney disease    Colon polyp    DDD (degenerative disc disease)    Depression    DJD (degenerative joint disease)    DOE (dyspnea on exertion)    Echo 04 /22/2014 - normal EF, mild LVH, grade 1 Diastolic Dysfunction; Lexiscan Myoview 04/28/12 - no ishcemia or infarction, LBBB septal wall motion; EF ~60%   Fatigue    Fatigue    pt states he gets tired very quickly   GERD (gastroesophageal reflux disease)    H/O iron deficiency anemia    with  hemmorrhoids   Hyperlipidemia    Hypertension    Left bundle branch block     chronic, negative Myoview a normal echo as noted above    Osteoarthritis    with bilateral hip surgeries and multiple complication, with the most recent  surgery in January 2012   Scrotal edema    Type 2 diabetes mellitus without complications ([place]) 01/60/1093   Vitamin D deficiency     Past Surgical History:  Procedure Laterality Date   Cardiology Nuclear Med Study   04/29/2012   overall impression ; NORMAL  STRESS  NUCLEAR STUDY   COLONOSCOPY  01/28/2010   HIP SURGERY Right 01/06/2002    replacement   HIP SURGERY Left 02/2010   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 Authorizing Provider  acetaminophen (TYLENOL) 325 MG tablet 1 tablet as needed   Yes [provider]  ALPRAZolam Duanne Moron) 0.25 MG tablet  01/09/16  Yes [provider]  amLODipine (NORVASC) 10 MG tablet 1 tablet   Yes [provider]  atorvastatin (LIPITOR) 10 MG tablet 1 tablet   Yes [provider]  bisoprolol-hydrochlorothiazide (ZIAC) 2.5-6.25 MG tablet  04/16/20  Yes [provider]  buPROPion (WELLBUTRIN XL) 150 MG 24 hr tablet TAKE 1 TABLET (150 MG TOTAL) BY MOUTH EVERY MORNING. 01/16/15  Yes Arfeen, Arlyce Harman, MD  buPROPion (WELLBUTRIN XL) 300 MG 24 hr tablet  06/30/18  Yes [provider]  doxazosin (CARDURA) 8 MG tablet Take 1 tablet (8 mg total) by mouth daily. 03/24/13  Yes Unk Pinto, MD  enalapril (VASOTEC) 20 MG tablet Take 20 mg by mouth 2 (two) times daily.   Yes [provider]  FLUoxetine (PROZAC) 40 MG capsule Take 1 capsule (40 mg total) by mouth daily. 01/16/15  Yes Arfeen, Arlyce Harman, MD  furosemide (LASIX) 20 MG tablet 1 tablet   Yes [provider]  Melatonin 5 MG SUBL Place 5 mg under the tongue at bedtime as needed (sleep).    Yes [provider]  omeprazole (PRILOSEC) 40 MG capsule Take 1 capsule (40 mg total) by mouth daily. 01/12/13  Yes Unk Pinto, MD  rivastigmine (EXELON) 1.5 MG capsule TAKE 1 CAPSULE(1.5 MG) BY MOUTH TWICE DAILY 04/24/21  Yes Camara, Amadou, MD  dorzolamide (TRUSOPT) 2 % ophthalmic solution Place 1 drop into the right eye 2 (two) times  daily. Patient not taking: Reported on 07/03/2021 08/02/20   [provider]  memantine (NAMENDA) 10 MG tablet Take 1 tablet (10 mg total) by mouth 2 (two) times daily. 04/01/21 07/03/21  Alric Ran, MD  Multiple Vitamins-Minerals (CENTRUM SILVER 50+MEN) TABS See admin instructions.    [provider]    Current Outpatient Medications  Medication Sig Dispense Refill   acetaminophen (TYLENOL) 325 MG tablet 1 tablet as needed     ALPRAZolam (XANAX) 0.25 MG tablet      amLODipine (NORVASC) 10 MG tablet 1 tablet     atorvastatin (LIPITOR) 10 MG tablet 1 tablet     bisoprolol-hydrochlorothiazide (ZIAC) 2.5-6.25 MG tablet      buPROPion (WELLBUTRIN XL) 150 MG 24 hr tablet TAKE 1 TABLET (150 MG TOTAL) BY MOUTH EVERY MORNING. 90 tablet 0   buPROPion (WELLBUTRIN XL) 300 MG 24 hr tablet      doxazosin (CARDURA) 8 MG tablet Take 1 tablet (8 mg total) by mouth daily. 90 tablet 3   enalapril (VASOTEC) 20 MG tablet Take 20 mg by mouth 2 (two) times daily.     FLUoxetine (PROZAC) 40 MG capsule Take 1 capsule (40 mg total) by mouth daily. 90 capsule 0   furosemide (LASIX) 20 MG tablet 1 tablet     Melatonin 5 MG SUBL Place 5 mg under the tongue at bedtime as needed (sleep).      omeprazole (PRILOSEC) 40 MG capsule Take 1 capsule (40 mg total) by mouth daily. 90 capsule 1   rivastigmine (EXELON) 1.5 MG capsule TAKE 1 CAPSULE(1.5 MG) BY MOUTH TWICE DAILY 180 capsule 3   dorzolamide (TRUSOPT) 2 % ophthalmic solution Place 1 drop into the right eye 2 (two) times daily. (Patient not taking: Reported on 07/03/2021)     memantine (NAMENDA) 10 MG tablet Take 1 tablet (10 mg total) by mouth 2 (two) times daily. 60 tablet 11   Multiple Vitamins-Minerals (CENTRUM SILVER 50+MEN) TABS See admin instructions.     Current Facility-Administered Medications  Medication Dose Route Frequency Provider Last Rate Last Admin   0.9 %  sodium chloride infusion  500 mL Intravenous Once Kamarrion Stfort, Carlota Raspberry, MD         Allergies as of 07/24/2021   (No Known Allergies)    Family History  Problem Relation Age of Onset   Dementia Mother    Hypertension Mother    Heart disease Mother    Diabetes Mother    Heart attack Maternal Grandmother    Diabetes Maternal Grandfather    Colon cancer Neg Hx    Colon polyps Neg Hx    Esophageal cancer Neg Hx    Rectal cancer Neg Hx  Stomach cancer Neg Hx     Social History   Socioeconomic History   Marital status: Married    Spouse name: [name]    Number of children: 2   Years of education: college   Highest education level: Not on file  Occupational History   Occupation: Retired    Fish farm manager: [employer]  Tobacco Use   Smoking status: Former    Packs/day: 0.50    Years: 30.00    Total pack years: 15.00    Types: Cigarettes    Quit date: 03/29/1973    Years since quitting: 48.3   Smokeless tobacco: Never  Substance and Sexual Activity   Alcohol use: Yes    Alcohol/week: 1.0 standard drink of alcohol    Types: 1 Standard drinks or equivalent per week    Comment: couple times a year   Drug use: No   Sexual activity: Not Currently  Other Topics Concern   Not on file  Social History Narrative   Drinks caffeine occasionally.   Patient is right handed.    He is a married father of 2,grand father of 38. He now walks around using cructhes,though he really does not get a lot of excerise.He quit smoking 3/ 1/2 years ago and drinks social alcohol. 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   Social Determinants of Health   Financial Resource Strain: Not on file  Food Insecurity: Not on file  Transportation Needs: Not on file  Physical Activity: Not on file  Stress: Not on file  Social Connections: Not on file  Intimate Partner Violence: Not on file    Review of Systems: All other review of systems negative except as mentioned in the HPI.  Physical Exam: Vital signs BP  (!) 171/81   Pulse 82   Temp (!) 97.1 F (36.2 C)   Resp 18   Ht '5\' 6"'$  (1.676 m)   Wt 154 lb (69.9 kg)   SpO2 96%   BMI 24.86 kg/m   General:   Alert,  Well-developed, pleasant and cooperative in NAD Lungs:  Clear throughout to auscultation.   Heart:  Regular rate and rhythm Abdomen:  Soft, nontender and nondistended.   Neuro/Psych:  Alert and cooperative. Normal mood and affect. A and O x 3  [name], MD [facility]

## 2021-07-24 NOTE — Progress Notes (Signed)
Called to room to assist during endoscopic procedure.  Patient ID and intended procedure confirmed with present staff. Received instructions for my participation in the procedure from the performing physician.  

## 2021-07-24 NOTE — Progress Notes (Signed)
PT taken to PACU. Monitors in place. VSS. Report given to RN. 

## 2021-07-24 NOTE — Progress Notes (Signed)
Pt's states no medical or surgical changes since previsit or office visit. 

## 2021-07-24 NOTE — Op Note (Signed)
[facility] Patient Name: Gerald [name] Procedure Date: 07/24/2021 9:22 AM MRN: [id number] Endoscopist: Remo Lipps P. Havery Moros , MD Age: 76 Referring MD:  Date of Birth: 25-Mar-1945 Gender: Male Account #: [id number] Procedure:                Colonoscopy Indications:              Screening for colorectal malignant neoplasm Medicines:                Monitored Anesthesia Care Procedure:                Pre-Anesthesia Assessment:                           - Prior to the procedure, a History and Physical                            was performed, and patient medications and                            allergies were reviewed. The patient's tolerance of                            previous anesthesia was also reviewed. The risks                            and benefits of the procedure and the sedation                            options and risks were discussed with the patient.                            All questions were answered, and informed consent                            was obtained. Prior Anticoagulants: The patient has                            taken no previous anticoagulant or antiplatelet                            agents. ASA Grade Assessment: III - A patient with                            severe systemic disease. After reviewing the risks                            and benefits, the patient was deemed in                            satisfactory condition to undergo the procedure.                           After obtaining informed consent, the colonoscope  was passed under direct vision. Throughout the                            procedure, the patient's blood pressure, pulse, and                            oxygen saturations were monitored continuously. The                            Olympus CF-HQ190L ([id number]) Colonoscope was                            introduced through the anus and advanced to the the                            cecum,  identified by appendiceal orifice and                            ileocecal valve. The colonoscopy was performed                            without difficulty. The patient tolerated the                            procedure well. The quality of the bowel                            preparation was adequate. The ileocecal valve,                            appendiceal orifice, and rectum were photographed. Scope In: 9:38:01 AM Scope Out: 9:53:01 AM Scope Withdrawal Time: 0 hours 11 minutes 10 seconds  Total Procedure Duration: 0 hours 15 minutes 0 seconds  Findings:                 The perianal exam findings include small (<1cm)                            hard cystic lesion, benign appearing.                           A few small-mouthed diverticula were found in the                            left colon and right colon.                           A 3 mm polyp was found in the descending colon. The                            polyp was sessile. The polyp was removed with a                            cold snare. Resection and retrieval were complete.  Internal hemorrhoids were found during retroflexion.                           Anal papilla(e) were hypertrophied.                           The exam was otherwise without abnormality. Complications:            No immediate complications. Estimated blood loss:                            Minimal. Estimated Blood Loss:     Estimated blood loss was minimal. Impression:               - Small (<1cm) hard cystic lesion. found on                            perianal exam - benign appearing.                           - Diverticulosis in the left colon and in the right                            colon.                           - One 3 mm polyp in the descending colon, removed                            with a cold snare. Resected and retrieved.                           - Internal hemorrhoids.                           - Anal  papilla(e) were hypertrophied.                           - The examination was otherwise normal. Recommendation:           - Patient has a contact number available for                            emergencies. The signs and symptoms of potential                            delayed complications were discussed with the                            patient. Return to normal activities tomorrow.                            Written discharge instructions were provided to the                            patient.                           -  Resume previous diet.                           - Continue present medications.                           - Await pathology results.                           - Perianal lesion seem benign appearing, will                            discuss if he wants a colorectal or dermatology                            evaluation for it                           - Likely no further colonoscopy surveillance is                            needed given age and no high risk lesions on this                            exam Carlota Raspberry. Andrea Ferrer, MD 07/24/2021 10:01:13 AM This report has been signed electronically.

## 2021-07-24 NOTE — Patient Instructions (Signed)
Resume previous diet and medications. Awaiting pathology results.  YOU HAD AN ENDOSCOPIC PROCEDURE TODAY AT [facility]:   Refer to the procedure report that was given to you for any specific questions about what was found during the examination.  If the procedure report does not answer your questions, please call your gastroenterologist to clarify.  If you requested that your care partner not be given the details of your procedure findings, then the procedure report has been included in a sealed envelope for you to review at your convenience later.  YOU SHOULD EXPECT: Some feelings of bloating in the abdomen. Passage of more gas than usual.  Walking can help get rid of the air that was put into your GI tract during the procedure and reduce the bloating. If you had a lower endoscopy (such as a colonoscopy or flexible sigmoidoscopy) you may notice spotting of blood in your stool or on the toilet paper. If you underwent a bowel prep for your procedure, you may not have a normal bowel movement for a few days.  Please Note:  You might notice some irritation and congestion in your nose or some drainage.  This is from the oxygen used during your procedure.  There is no need for concern and it should clear up in a day or so.  SYMPTOMS TO REPORT IMMEDIATELY:  Following lower endoscopy (colonoscopy or flexible sigmoidoscopy):  Excessive amounts of blood in the stool  Significant tenderness or worsening of abdominal pains  Swelling of the abdomen that is new, acute  Fever of 100F or higher  For urgent or emergent issues, a gastroenterologist can be reached at any hour by calling [phone]. Do not use MyChart messaging for urgent concerns.    DIET:  We do recommend a small meal at first, but then you may proceed to your regular diet.  Drink plenty of fluids but you should avoid alcoholic beverages for 24 hours.  ACTIVITY:  You should plan to take it easy for the rest of today and  you should NOT DRIVE or use heavy machinery until tomorrow (because of the sedation medicines used during the test).    FOLLOW UP: Our staff will call the number listed on your records the next business day following your procedure.  We will call around 7:15- 8:00 am to check on you and address any questions or concerns that you may have regarding the information given to you following your procedure. If we do not reach you, we will leave a message.  If you develop any symptoms (ie: fever, flu-like symptoms, shortness of breath, cough etc.) before then, please call [phone].  If you test positive for Covid 19 in the 2 weeks post procedure, please call and report this information to [place].    If any biopsies were taken you will be contacted by phone or by letter within the next 1-3 weeks.  Please call us at [phone] if you have not heard about the biopsies in 3 weeks.    SIGNATURES/CONFIDENTIALITY: You and/or your care partner have signed paperwork which will be entered into your electronic medical record.  These signatures attest to the fact that that the information above on your After Visit Summary has been reviewed and is understood.  Full responsibility of the confidentiality of this discharge information lies with you and/or your care-partner.

## 2021-07-25 ENCOUNTER — Encounter: Payer: Self-pay | Admitting: Neurology

## 2021-07-25 ENCOUNTER — Ambulatory Visit: Payer: Medicare Other | Admitting: Neurology

## 2021-07-25 ENCOUNTER — Telehealth: Payer: Self-pay

## 2021-07-25 VITALS — BP 161/70 | HR 65 | Ht 66.0 in | Wt 155.0 lb

## 2021-07-25 DIAGNOSIS — F02B18 Dementia in other diseases classified elsewhere, moderate, with other behavioral disturbance: Secondary | ICD-10-CM

## 2021-07-25 DIAGNOSIS — G301 Alzheimer's disease with late onset: Secondary | ICD-10-CM

## 2021-07-25 DIAGNOSIS — R269 Unspecified abnormalities of gait and mobility: Secondary | ICD-10-CM

## 2021-07-25 MED ORDER — RIVASTIGMINE TARTRATE 3 MG PO CAPS: 3.0000 mg | ORAL_CAPSULE | Freq: Two times a day (BID) | ORAL | 3 refills | Status: DC

## 2021-07-25 MED ORDER — MEMANTINE HCL 10 MG PO TABS: 10.0000 mg | ORAL_TABLET | Freq: Two times a day (BID) | ORAL | 3 refills | Status: AC
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
## 2021-07-25 NOTE — Patient Instructions (Addendum)
Referral to Home health Services  Continue with Namenda 10 mg BID  Increase Exelon to 3 mg BID  Repeat neuropsychiatry testing  Advise against driving, ask family to take the car keys away Follow up in 3 months or sooner if worse. He is already on Xanax night, will consider nightly Seroquel if behavior gets worse

## 2021-07-25 NOTE — Telephone Encounter (Signed)
No answer, left message to call if having any issues or concerns, B.Revere Maahs RN 

## 2021-07-29 ENCOUNTER — Telehealth: Payer: Self-pay | Admitting: Neurology

## 2021-07-29 NOTE — Telephone Encounter (Signed)
Referral for Neuropsychology sent to [facility] [phone].

## 2021-07-29 NOTE — Telephone Encounter (Signed)
[facility] is taking this patient. 

## 2021-07-30 DIAGNOSIS — K219 Gastro-esophageal reflux disease without esophagitis: Secondary | ICD-10-CM | POA: Diagnosis not present

## 2021-07-30 DIAGNOSIS — I130 Hypertensive heart and chronic kidney disease with heart failure and stage 1 through stage 4 chronic kidney disease, or unspecified chronic kidney disease: Secondary | ICD-10-CM | POA: Diagnosis not present

## 2021-07-30 DIAGNOSIS — E785 Hyperlipidemia, unspecified: Secondary | ICD-10-CM | POA: Diagnosis not present

## 2021-07-30 DIAGNOSIS — Z87891 Personal history of nicotine dependence: Secondary | ICD-10-CM | POA: Diagnosis not present

## 2021-07-30 DIAGNOSIS — I509 Heart failure, unspecified: Secondary | ICD-10-CM | POA: Diagnosis not present

## 2021-07-30 DIAGNOSIS — M519 Unspecified thoracic, thoracolumbar and lumbosacral intervertebral disc disorder: Secondary | ICD-10-CM | POA: Diagnosis not present

## 2021-07-30 DIAGNOSIS — R296 Repeated falls: Secondary | ICD-10-CM | POA: Diagnosis not present

## 2021-07-30 DIAGNOSIS — E1122 Type 2 diabetes mellitus with diabetic chronic kidney disease: Secondary | ICD-10-CM | POA: Diagnosis not present

## 2021-07-30 DIAGNOSIS — G4720 Circadian rhythm sleep disorder, unspecified type: Secondary | ICD-10-CM | POA: Diagnosis not present

## 2021-07-30 DIAGNOSIS — I447 Left bundle-branch block, unspecified: Secondary | ICD-10-CM | POA: Diagnosis not present

## 2021-07-30 DIAGNOSIS — G301 Alzheimer's disease with late onset: Secondary | ICD-10-CM | POA: Diagnosis not present

## 2021-07-30 DIAGNOSIS — Z9181 History of falling: Secondary | ICD-10-CM | POA: Diagnosis not present

## 2021-07-30 DIAGNOSIS — F02B3 Dementia in other diseases classified elsewhere, moderate, with mood disturbance: Secondary | ICD-10-CM | POA: Diagnosis not present

## 2021-07-30 DIAGNOSIS — M1990 Unspecified osteoarthritis, unspecified site: Secondary | ICD-10-CM | POA: Diagnosis not present

## 2021-07-30 DIAGNOSIS — F02B4 Dementia in other diseases classified elsewhere, moderate, with anxiety: Secondary | ICD-10-CM | POA: Diagnosis not present

## 2021-07-30 DIAGNOSIS — N189 Chronic kidney disease, unspecified: Secondary | ICD-10-CM | POA: Diagnosis not present

## 2021-07-30 DIAGNOSIS — E559 Vitamin D deficiency, unspecified: Secondary | ICD-10-CM | POA: Diagnosis not present

## 2021-07-31 ENCOUNTER — Telehealth: Payer: Self-pay | Admitting: Neurology

## 2021-07-31 DIAGNOSIS — I509 Heart failure, unspecified: Secondary | ICD-10-CM | POA: Diagnosis not present

## 2021-07-31 DIAGNOSIS — M519 Unspecified thoracic, thoracolumbar and lumbosacral intervertebral disc disorder: Secondary | ICD-10-CM | POA: Diagnosis not present

## 2021-07-31 DIAGNOSIS — E785 Hyperlipidemia, unspecified: Secondary | ICD-10-CM | POA: Diagnosis not present

## 2021-07-31 DIAGNOSIS — M1990 Unspecified osteoarthritis, unspecified site: Secondary | ICD-10-CM | POA: Diagnosis not present

## 2021-07-31 DIAGNOSIS — R296 Repeated falls: Secondary | ICD-10-CM | POA: Diagnosis not present

## 2021-07-31 DIAGNOSIS — Z87891 Personal history of nicotine dependence: Secondary | ICD-10-CM | POA: Diagnosis not present

## 2021-07-31 DIAGNOSIS — Z9181 History of falling: Secondary | ICD-10-CM | POA: Diagnosis not present

## 2021-07-31 DIAGNOSIS — G4720 Circadian rhythm sleep disorder, unspecified type: Secondary | ICD-10-CM | POA: Diagnosis not present

## 2021-07-31 DIAGNOSIS — I447 Left bundle-branch block, unspecified: Secondary | ICD-10-CM | POA: Diagnosis not present

## 2021-07-31 DIAGNOSIS — F02B4 Dementia in other diseases classified elsewhere, moderate, with anxiety: Secondary | ICD-10-CM | POA: Diagnosis not present

## 2021-07-31 DIAGNOSIS — F02B3 Dementia in other diseases classified elsewhere, moderate, with mood disturbance: Secondary | ICD-10-CM | POA: Diagnosis not present

## 2021-07-31 DIAGNOSIS — E559 Vitamin D deficiency, unspecified: Secondary | ICD-10-CM | POA: Diagnosis not present

## 2021-07-31 DIAGNOSIS — K219 Gastro-esophageal reflux disease without esophagitis: Secondary | ICD-10-CM | POA: Diagnosis not present

## 2021-07-31 DIAGNOSIS — G301 Alzheimer's disease with late onset: Secondary | ICD-10-CM | POA: Diagnosis not present

## 2021-07-31 DIAGNOSIS — I130 Hypertensive heart and chronic kidney disease with heart failure and stage 1 through stage 4 chronic kidney disease, or unspecified chronic kidney disease: Secondary | ICD-10-CM | POA: Diagnosis not present

## 2021-07-31 DIAGNOSIS — N189 Chronic kidney disease, unspecified: Secondary | ICD-10-CM | POA: Diagnosis not present

## 2021-07-31 DIAGNOSIS — E1122 Type 2 diabetes mellitus with diabetic chronic kidney disease: Secondary | ICD-10-CM | POA: Diagnosis not present

## 2021-07-31 NOTE — Telephone Encounter (Signed)
Pt's wife is asking for a call to discuss a request a script for Seroquel

## 2021-07-31 NOTE — Telephone Encounter (Signed)
I spoke to the patient's wife. Reports he is getting up throughout the night and pacing around the house then sleeping the following day. His agitation is worse as well.   He receiving psychiatric care from Dr. Norma Fredrickson. He is currently taking alprazolam, bupropion and fluoxetine. His wife left a message at that office this morning about changing him to quetiapine. She is waiting on a call back.  I informed her it is best for one physician to make this type of medication change. This is to ensure his safety. She verbalized understanding and will wait to hear back from psychiatry. If she has further questions, she will call us back.

## 2021-07-31 NOTE — Telephone Encounter (Signed)
I agree, we can defer to [place].   Thanks

## 2021-08-01 DIAGNOSIS — I447 Left bundle-branch block, unspecified: Secondary | ICD-10-CM | POA: Diagnosis not present

## 2021-08-01 DIAGNOSIS — I509 Heart failure, unspecified: Secondary | ICD-10-CM | POA: Diagnosis not present

## 2021-08-01 DIAGNOSIS — R296 Repeated falls: Secondary | ICD-10-CM | POA: Diagnosis not present

## 2021-08-01 DIAGNOSIS — M1990 Unspecified osteoarthritis, unspecified site: Secondary | ICD-10-CM | POA: Diagnosis not present

## 2021-08-01 DIAGNOSIS — K219 Gastro-esophageal reflux disease without esophagitis: Secondary | ICD-10-CM | POA: Diagnosis not present

## 2021-08-01 DIAGNOSIS — E1122 Type 2 diabetes mellitus with diabetic chronic kidney disease: Secondary | ICD-10-CM | POA: Diagnosis not present

## 2021-08-01 DIAGNOSIS — M519 Unspecified thoracic, thoracolumbar and lumbosacral intervertebral disc disorder: Secondary | ICD-10-CM | POA: Diagnosis not present

## 2021-08-01 DIAGNOSIS — Z9181 History of falling: Secondary | ICD-10-CM | POA: Diagnosis not present

## 2021-08-01 DIAGNOSIS — I130 Hypertensive heart and chronic kidney disease with heart failure and stage 1 through stage 4 chronic kidney disease, or unspecified chronic kidney disease: Secondary | ICD-10-CM | POA: Diagnosis not present

## 2021-08-01 DIAGNOSIS — Z87891 Personal history of nicotine dependence: Secondary | ICD-10-CM | POA: Diagnosis not present

## 2021-08-01 DIAGNOSIS — N189 Chronic kidney disease, unspecified: Secondary | ICD-10-CM | POA: Diagnosis not present

## 2021-08-01 DIAGNOSIS — F02B3 Dementia in other diseases classified elsewhere, moderate, with mood disturbance: Secondary | ICD-10-CM | POA: Diagnosis not present

## 2021-08-01 DIAGNOSIS — E559 Vitamin D deficiency, unspecified: Secondary | ICD-10-CM | POA: Diagnosis not present

## 2021-08-01 DIAGNOSIS — F02B4 Dementia in other diseases classified elsewhere, moderate, with anxiety: Secondary | ICD-10-CM | POA: Diagnosis not present

## 2021-08-01 DIAGNOSIS — G301 Alzheimer's disease with late onset: Secondary | ICD-10-CM | POA: Diagnosis not present

## 2021-08-01 DIAGNOSIS — E785 Hyperlipidemia, unspecified: Secondary | ICD-10-CM | POA: Diagnosis not present

## 2021-08-01 DIAGNOSIS — G4720 Circadian rhythm sleep disorder, unspecified type: Secondary | ICD-10-CM | POA: Diagnosis not present

## 2021-08-05 ENCOUNTER — Telehealth: Payer: Self-pay | Admitting: Neurology

## 2021-08-05 ENCOUNTER — Other Ambulatory Visit: Payer: Self-pay | Admitting: Neurology

## 2021-08-05 MED ORDER — QUETIAPINE FUMARATE 25 MG PO TABS: 12.5000 mg | ORAL_TABLET | Freq: Two times a day (BID) | ORAL | 3 refills | Status: DC

## 2021-08-05 NOTE — Telephone Encounter (Signed)
Pt wife Gerald [name]) is calling and said she wants to report some stuff to provider/ nurse because social worker haven't got in contact with her.

## 2021-08-05 NOTE — Telephone Encounter (Signed)
I spoke with the patient's wife, Gerald [name] (as per DPR). The family has an appointment with a Chief Technology Officer.   She stated the patient has become more disagreeable. Last Friday he refused to use his walker and insisted on using his cane. Saturday the patient fell while using the cane, he did not suffer any injuries. He previously fell on Wednesday during physical therapy and was uninjured as well. He continues to refuse to walk with his walker.  She stated Dr. Casimiro Needle discontinued the patient's xanax. She requested Dr. April Manson to send out quetiapine for the patient. Dr. April Manson stated in a previous telephone note that Dr. Casimiro Needle should take over handling psychiatric medications for the patient due to his polypharmacy treatment. I informed the patient to follow up with Dr. Casimiro Needle. She was agreeable to calling him tomorrow.

## 2021-08-05 NOTE — Progress Notes (Signed)
Patient Xanax discontinued by Dr. Casimiro Needle. Wife reports that he is still having issues, will start Seroquel low dose 12.5 mg twice daily and patient will follow up with Dr. Casimiro Needle in September and he can increase as needed.   Dr. April Manson

## 2021-08-05 NOTE — Telephone Encounter (Signed)
Spoke with Wife, patient xanax discontinue by Dr. Casimiro Needle, will start him on low dose Seroquel and increase as tolerated.

## 2021-08-06 DIAGNOSIS — R296 Repeated falls: Secondary | ICD-10-CM | POA: Diagnosis not present

## 2021-08-06 DIAGNOSIS — Z87891 Personal history of nicotine dependence: Secondary | ICD-10-CM | POA: Diagnosis not present

## 2021-08-06 DIAGNOSIS — E785 Hyperlipidemia, unspecified: Secondary | ICD-10-CM | POA: Diagnosis not present

## 2021-08-06 DIAGNOSIS — F02B3 Dementia in other diseases classified elsewhere, moderate, with mood disturbance: Secondary | ICD-10-CM | POA: Diagnosis not present

## 2021-08-06 DIAGNOSIS — R262 Difficulty in walking, not elsewhere classified: Secondary | ICD-10-CM | POA: Diagnosis not present

## 2021-08-06 DIAGNOSIS — F02B4 Dementia in other diseases classified elsewhere, moderate, with anxiety: Secondary | ICD-10-CM | POA: Diagnosis not present

## 2021-08-06 DIAGNOSIS — I447 Left bundle-branch block, unspecified: Secondary | ICD-10-CM | POA: Diagnosis not present

## 2021-08-06 DIAGNOSIS — N189 Chronic kidney disease, unspecified: Secondary | ICD-10-CM | POA: Diagnosis not present

## 2021-08-06 DIAGNOSIS — G4720 Circadian rhythm sleep disorder, unspecified type: Secondary | ICD-10-CM | POA: Diagnosis not present

## 2021-08-06 DIAGNOSIS — G301 Alzheimer's disease with late onset: Secondary | ICD-10-CM | POA: Diagnosis not present

## 2021-08-06 DIAGNOSIS — E559 Vitamin D deficiency, unspecified: Secondary | ICD-10-CM | POA: Diagnosis not present

## 2021-08-06 DIAGNOSIS — E1122 Type 2 diabetes mellitus with diabetic chronic kidney disease: Secondary | ICD-10-CM | POA: Diagnosis not present

## 2021-08-06 DIAGNOSIS — M1990 Unspecified osteoarthritis, unspecified site: Secondary | ICD-10-CM | POA: Diagnosis not present

## 2021-08-06 DIAGNOSIS — M519 Unspecified thoracic, thoracolumbar and lumbosacral intervertebral disc disorder: Secondary | ICD-10-CM | POA: Diagnosis not present

## 2021-08-06 DIAGNOSIS — K219 Gastro-esophageal reflux disease without esophagitis: Secondary | ICD-10-CM | POA: Diagnosis not present

## 2021-08-06 DIAGNOSIS — Z9181 History of falling: Secondary | ICD-10-CM | POA: Diagnosis not present

## 2021-08-06 DIAGNOSIS — I509 Heart failure, unspecified: Secondary | ICD-10-CM | POA: Diagnosis not present

## 2021-08-06 DIAGNOSIS — M6281 Muscle weakness (generalized): Secondary | ICD-10-CM | POA: Diagnosis not present

## 2021-08-06 DIAGNOSIS — I130 Hypertensive heart and chronic kidney disease with heart failure and stage 1 through stage 4 chronic kidney disease, or unspecified chronic kidney disease: Secondary | ICD-10-CM | POA: Diagnosis not present

## 2021-08-07 ENCOUNTER — Telehealth: Payer: Self-pay | Admitting: Neurology

## 2021-08-07 DIAGNOSIS — I509 Heart failure, unspecified: Secondary | ICD-10-CM | POA: Diagnosis not present

## 2021-08-07 DIAGNOSIS — R296 Repeated falls: Secondary | ICD-10-CM | POA: Diagnosis not present

## 2021-08-07 DIAGNOSIS — E559 Vitamin D deficiency, unspecified: Secondary | ICD-10-CM | POA: Diagnosis not present

## 2021-08-07 DIAGNOSIS — E1122 Type 2 diabetes mellitus with diabetic chronic kidney disease: Secondary | ICD-10-CM | POA: Diagnosis not present

## 2021-08-07 DIAGNOSIS — Z9181 History of falling: Secondary | ICD-10-CM | POA: Diagnosis not present

## 2021-08-07 DIAGNOSIS — G301 Alzheimer's disease with late onset: Secondary | ICD-10-CM | POA: Diagnosis not present

## 2021-08-07 DIAGNOSIS — F02B4 Dementia in other diseases classified elsewhere, moderate, with anxiety: Secondary | ICD-10-CM | POA: Diagnosis not present

## 2021-08-07 DIAGNOSIS — I130 Hypertensive heart and chronic kidney disease with heart failure and stage 1 through stage 4 chronic kidney disease, or unspecified chronic kidney disease: Secondary | ICD-10-CM | POA: Diagnosis not present

## 2021-08-07 DIAGNOSIS — G4720 Circadian rhythm sleep disorder, unspecified type: Secondary | ICD-10-CM | POA: Diagnosis not present

## 2021-08-07 DIAGNOSIS — K219 Gastro-esophageal reflux disease without esophagitis: Secondary | ICD-10-CM | POA: Diagnosis not present

## 2021-08-07 DIAGNOSIS — F02B3 Dementia in other diseases classified elsewhere, moderate, with mood disturbance: Secondary | ICD-10-CM | POA: Diagnosis not present

## 2021-08-07 DIAGNOSIS — M519 Unspecified thoracic, thoracolumbar and lumbosacral intervertebral disc disorder: Secondary | ICD-10-CM | POA: Diagnosis not present

## 2021-08-07 DIAGNOSIS — M1990 Unspecified osteoarthritis, unspecified site: Secondary | ICD-10-CM | POA: Diagnosis not present

## 2021-08-07 DIAGNOSIS — Z87891 Personal history of nicotine dependence: Secondary | ICD-10-CM | POA: Diagnosis not present

## 2021-08-07 DIAGNOSIS — N189 Chronic kidney disease, unspecified: Secondary | ICD-10-CM | POA: Diagnosis not present

## 2021-08-07 DIAGNOSIS — I447 Left bundle-branch block, unspecified: Secondary | ICD-10-CM | POA: Diagnosis not present

## 2021-08-07 DIAGNOSIS — E785 Hyperlipidemia, unspecified: Secondary | ICD-10-CM | POA: Diagnosis not present

## 2021-08-07 NOTE — Telephone Encounter (Signed)
I spoke with the pharmacist, Marcella Dubs at [facility] on [address]. She confirmed it is okay for quetiapine 25 MG to be crumbled or crushed.  I spoke with the patient's wife, Gerald [name] (as per DPR). I informed her it is okay to administer quetiapine crumbled or crushed. She is comfortable with administering half of the tablet crumbled to the patient.  She verbalized understanding/appreciation for the call.

## 2021-08-07 NOTE — Telephone Encounter (Signed)
Pt's wife purchased a pill cutter for the QUEtiapine (SEROQUEL) 25 MG tablet, she states that because the pill is already so small when she uses the pill cutter the pill crumbles, she'd like a call as to how best to give pt the medication.

## 2021-08-08 ENCOUNTER — Telehealth: Payer: Self-pay | Admitting: Neurology

## 2021-08-08 DIAGNOSIS — E559 Vitamin D deficiency, unspecified: Secondary | ICD-10-CM | POA: Diagnosis not present

## 2021-08-08 DIAGNOSIS — R296 Repeated falls: Secondary | ICD-10-CM | POA: Diagnosis not present

## 2021-08-08 DIAGNOSIS — H524 Presbyopia: Secondary | ICD-10-CM | POA: Diagnosis not present

## 2021-08-08 DIAGNOSIS — H35073 Retinal telangiectasis, bilateral: Secondary | ICD-10-CM | POA: Diagnosis not present

## 2021-08-08 DIAGNOSIS — G4720 Circadian rhythm sleep disorder, unspecified type: Secondary | ICD-10-CM | POA: Diagnosis not present

## 2021-08-08 DIAGNOSIS — M519 Unspecified thoracic, thoracolumbar and lumbosacral intervertebral disc disorder: Secondary | ICD-10-CM | POA: Diagnosis not present

## 2021-08-08 DIAGNOSIS — N189 Chronic kidney disease, unspecified: Secondary | ICD-10-CM | POA: Diagnosis not present

## 2021-08-08 DIAGNOSIS — E1122 Type 2 diabetes mellitus with diabetic chronic kidney disease: Secondary | ICD-10-CM | POA: Diagnosis not present

## 2021-08-08 DIAGNOSIS — K219 Gastro-esophageal reflux disease without esophagitis: Secondary | ICD-10-CM | POA: Diagnosis not present

## 2021-08-08 DIAGNOSIS — F02B4 Dementia in other diseases classified elsewhere, moderate, with anxiety: Secondary | ICD-10-CM | POA: Diagnosis not present

## 2021-08-08 DIAGNOSIS — H35353 Cystoid macular degeneration, bilateral: Secondary | ICD-10-CM | POA: Diagnosis not present

## 2021-08-08 DIAGNOSIS — E113313 Type 2 diabetes mellitus with moderate nonproliferative diabetic retinopathy with macular edema, bilateral: Secondary | ICD-10-CM | POA: Diagnosis not present

## 2021-08-08 DIAGNOSIS — H40023 Open angle with borderline findings, high risk, bilateral: Secondary | ICD-10-CM | POA: Diagnosis not present

## 2021-08-08 DIAGNOSIS — E785 Hyperlipidemia, unspecified: Secondary | ICD-10-CM | POA: Diagnosis not present

## 2021-08-08 DIAGNOSIS — F02B3 Dementia in other diseases classified elsewhere, moderate, with mood disturbance: Secondary | ICD-10-CM | POA: Diagnosis not present

## 2021-08-08 DIAGNOSIS — M1990 Unspecified osteoarthritis, unspecified site: Secondary | ICD-10-CM | POA: Diagnosis not present

## 2021-08-08 DIAGNOSIS — G301 Alzheimer's disease with late onset: Secondary | ICD-10-CM | POA: Diagnosis not present

## 2021-08-08 DIAGNOSIS — Z87891 Personal history of nicotine dependence: Secondary | ICD-10-CM | POA: Diagnosis not present

## 2021-08-08 DIAGNOSIS — I447 Left bundle-branch block, unspecified: Secondary | ICD-10-CM | POA: Diagnosis not present

## 2021-08-08 DIAGNOSIS — I130 Hypertensive heart and chronic kidney disease with heart failure and stage 1 through stage 4 chronic kidney disease, or unspecified chronic kidney disease: Secondary | ICD-10-CM | POA: Diagnosis not present

## 2021-08-08 DIAGNOSIS — Z9181 History of falling: Secondary | ICD-10-CM | POA: Diagnosis not present

## 2021-08-08 DIAGNOSIS — I509 Heart failure, unspecified: Secondary | ICD-10-CM | POA: Diagnosis not present

## 2021-08-08 NOTE — Telephone Encounter (Signed)
[place] Therapist w/ [facility] has called for verbal orders for 1 week 8, her vm is secure

## 2021-08-08 NOTE — Telephone Encounter (Signed)
I returned the call to [place]. She will proceed with the orders below to meet the patient's needs.

## 2021-08-12 DIAGNOSIS — Z9181 History of falling: Secondary | ICD-10-CM | POA: Diagnosis not present

## 2021-08-12 DIAGNOSIS — G301 Alzheimer's disease with late onset: Secondary | ICD-10-CM | POA: Diagnosis not present

## 2021-08-12 DIAGNOSIS — I447 Left bundle-branch block, unspecified: Secondary | ICD-10-CM | POA: Diagnosis not present

## 2021-08-12 DIAGNOSIS — E1122 Type 2 diabetes mellitus with diabetic chronic kidney disease: Secondary | ICD-10-CM | POA: Diagnosis not present

## 2021-08-12 DIAGNOSIS — Z87891 Personal history of nicotine dependence: Secondary | ICD-10-CM | POA: Diagnosis not present

## 2021-08-12 DIAGNOSIS — K219 Gastro-esophageal reflux disease without esophagitis: Secondary | ICD-10-CM | POA: Diagnosis not present

## 2021-08-12 DIAGNOSIS — R296 Repeated falls: Secondary | ICD-10-CM | POA: Diagnosis not present

## 2021-08-12 DIAGNOSIS — N189 Chronic kidney disease, unspecified: Secondary | ICD-10-CM | POA: Diagnosis not present

## 2021-08-12 DIAGNOSIS — M1990 Unspecified osteoarthritis, unspecified site: Secondary | ICD-10-CM | POA: Diagnosis not present

## 2021-08-12 DIAGNOSIS — G4720 Circadian rhythm sleep disorder, unspecified type: Secondary | ICD-10-CM | POA: Diagnosis not present

## 2021-08-12 DIAGNOSIS — M519 Unspecified thoracic, thoracolumbar and lumbosacral intervertebral disc disorder: Secondary | ICD-10-CM | POA: Diagnosis not present

## 2021-08-12 DIAGNOSIS — F02B3 Dementia in other diseases classified elsewhere, moderate, with mood disturbance: Secondary | ICD-10-CM | POA: Diagnosis not present

## 2021-08-12 DIAGNOSIS — F02B4 Dementia in other diseases classified elsewhere, moderate, with anxiety: Secondary | ICD-10-CM | POA: Diagnosis not present

## 2021-08-12 DIAGNOSIS — E785 Hyperlipidemia, unspecified: Secondary | ICD-10-CM | POA: Diagnosis not present

## 2021-08-12 DIAGNOSIS — I130 Hypertensive heart and chronic kidney disease with heart failure and stage 1 through stage 4 chronic kidney disease, or unspecified chronic kidney disease: Secondary | ICD-10-CM | POA: Diagnosis not present

## 2021-08-12 DIAGNOSIS — I509 Heart failure, unspecified: Secondary | ICD-10-CM | POA: Diagnosis not present

## 2021-08-12 DIAGNOSIS — E559 Vitamin D deficiency, unspecified: Secondary | ICD-10-CM | POA: Diagnosis not present

## 2021-08-14 DIAGNOSIS — M1990 Unspecified osteoarthritis, unspecified site: Secondary | ICD-10-CM | POA: Diagnosis not present

## 2021-08-14 DIAGNOSIS — M519 Unspecified thoracic, thoracolumbar and lumbosacral intervertebral disc disorder: Secondary | ICD-10-CM | POA: Diagnosis not present

## 2021-08-14 DIAGNOSIS — E559 Vitamin D deficiency, unspecified: Secondary | ICD-10-CM | POA: Diagnosis not present

## 2021-08-14 DIAGNOSIS — R296 Repeated falls: Secondary | ICD-10-CM | POA: Diagnosis not present

## 2021-08-14 DIAGNOSIS — F02B3 Dementia in other diseases classified elsewhere, moderate, with mood disturbance: Secondary | ICD-10-CM | POA: Diagnosis not present

## 2021-08-14 DIAGNOSIS — G301 Alzheimer's disease with late onset: Secondary | ICD-10-CM | POA: Diagnosis not present

## 2021-08-14 DIAGNOSIS — N189 Chronic kidney disease, unspecified: Secondary | ICD-10-CM | POA: Diagnosis not present

## 2021-08-14 DIAGNOSIS — F02B4 Dementia in other diseases classified elsewhere, moderate, with anxiety: Secondary | ICD-10-CM | POA: Diagnosis not present

## 2021-08-14 DIAGNOSIS — I447 Left bundle-branch block, unspecified: Secondary | ICD-10-CM | POA: Diagnosis not present

## 2021-08-14 DIAGNOSIS — K219 Gastro-esophageal reflux disease without esophagitis: Secondary | ICD-10-CM | POA: Diagnosis not present

## 2021-08-14 DIAGNOSIS — G4720 Circadian rhythm sleep disorder, unspecified type: Secondary | ICD-10-CM | POA: Diagnosis not present

## 2021-08-14 DIAGNOSIS — I509 Heart failure, unspecified: Secondary | ICD-10-CM | POA: Diagnosis not present

## 2021-08-14 DIAGNOSIS — Z87891 Personal history of nicotine dependence: Secondary | ICD-10-CM | POA: Diagnosis not present

## 2021-08-14 DIAGNOSIS — E785 Hyperlipidemia, unspecified: Secondary | ICD-10-CM | POA: Diagnosis not present

## 2021-08-14 DIAGNOSIS — Z9181 History of falling: Secondary | ICD-10-CM | POA: Diagnosis not present

## 2021-08-14 DIAGNOSIS — I130 Hypertensive heart and chronic kidney disease with heart failure and stage 1 through stage 4 chronic kidney disease, or unspecified chronic kidney disease: Secondary | ICD-10-CM | POA: Diagnosis not present

## 2021-08-14 DIAGNOSIS — E1122 Type 2 diabetes mellitus with diabetic chronic kidney disease: Secondary | ICD-10-CM | POA: Diagnosis not present

## 2021-08-15 DIAGNOSIS — I509 Heart failure, unspecified: Secondary | ICD-10-CM | POA: Diagnosis not present

## 2021-08-15 DIAGNOSIS — R296 Repeated falls: Secondary | ICD-10-CM | POA: Diagnosis not present

## 2021-08-15 DIAGNOSIS — G301 Alzheimer's disease with late onset: Secondary | ICD-10-CM | POA: Diagnosis not present

## 2021-08-15 DIAGNOSIS — F02B3 Dementia in other diseases classified elsewhere, moderate, with mood disturbance: Secondary | ICD-10-CM | POA: Diagnosis not present

## 2021-08-15 DIAGNOSIS — I447 Left bundle-branch block, unspecified: Secondary | ICD-10-CM | POA: Diagnosis not present

## 2021-08-15 DIAGNOSIS — E785 Hyperlipidemia, unspecified: Secondary | ICD-10-CM | POA: Diagnosis not present

## 2021-08-15 DIAGNOSIS — M519 Unspecified thoracic, thoracolumbar and lumbosacral intervertebral disc disorder: Secondary | ICD-10-CM | POA: Diagnosis not present

## 2021-08-15 DIAGNOSIS — Z87891 Personal history of nicotine dependence: Secondary | ICD-10-CM | POA: Diagnosis not present

## 2021-08-15 DIAGNOSIS — M1990 Unspecified osteoarthritis, unspecified site: Secondary | ICD-10-CM | POA: Diagnosis not present

## 2021-08-15 DIAGNOSIS — F02B4 Dementia in other diseases classified elsewhere, moderate, with anxiety: Secondary | ICD-10-CM | POA: Diagnosis not present

## 2021-08-15 DIAGNOSIS — N189 Chronic kidney disease, unspecified: Secondary | ICD-10-CM | POA: Diagnosis not present

## 2021-08-15 DIAGNOSIS — I130 Hypertensive heart and chronic kidney disease with heart failure and stage 1 through stage 4 chronic kidney disease, or unspecified chronic kidney disease: Secondary | ICD-10-CM | POA: Diagnosis not present

## 2021-08-15 DIAGNOSIS — G4720 Circadian rhythm sleep disorder, unspecified type: Secondary | ICD-10-CM | POA: Diagnosis not present

## 2021-08-15 DIAGNOSIS — E1122 Type 2 diabetes mellitus with diabetic chronic kidney disease: Secondary | ICD-10-CM | POA: Diagnosis not present

## 2021-08-15 DIAGNOSIS — Z9181 History of falling: Secondary | ICD-10-CM | POA: Diagnosis not present

## 2021-08-15 DIAGNOSIS — K219 Gastro-esophageal reflux disease without esophagitis: Secondary | ICD-10-CM | POA: Diagnosis not present

## 2021-08-15 DIAGNOSIS — E559 Vitamin D deficiency, unspecified: Secondary | ICD-10-CM | POA: Diagnosis not present

## 2021-08-16 DIAGNOSIS — K219 Gastro-esophageal reflux disease without esophagitis: Secondary | ICD-10-CM | POA: Diagnosis not present

## 2021-08-16 DIAGNOSIS — E1122 Type 2 diabetes mellitus with diabetic chronic kidney disease: Secondary | ICD-10-CM | POA: Diagnosis not present

## 2021-08-16 DIAGNOSIS — Z9181 History of falling: Secondary | ICD-10-CM | POA: Diagnosis not present

## 2021-08-16 DIAGNOSIS — G301 Alzheimer's disease with late onset: Secondary | ICD-10-CM | POA: Diagnosis not present

## 2021-08-16 DIAGNOSIS — E785 Hyperlipidemia, unspecified: Secondary | ICD-10-CM | POA: Diagnosis not present

## 2021-08-16 DIAGNOSIS — I509 Heart failure, unspecified: Secondary | ICD-10-CM | POA: Diagnosis not present

## 2021-08-16 DIAGNOSIS — M1990 Unspecified osteoarthritis, unspecified site: Secondary | ICD-10-CM | POA: Diagnosis not present

## 2021-08-16 DIAGNOSIS — G4720 Circadian rhythm sleep disorder, unspecified type: Secondary | ICD-10-CM | POA: Diagnosis not present

## 2021-08-16 DIAGNOSIS — I447 Left bundle-branch block, unspecified: Secondary | ICD-10-CM | POA: Diagnosis not present

## 2021-08-16 DIAGNOSIS — M519 Unspecified thoracic, thoracolumbar and lumbosacral intervertebral disc disorder: Secondary | ICD-10-CM | POA: Diagnosis not present

## 2021-08-16 DIAGNOSIS — F02B3 Dementia in other diseases classified elsewhere, moderate, with mood disturbance: Secondary | ICD-10-CM | POA: Diagnosis not present

## 2021-08-16 DIAGNOSIS — F02B4 Dementia in other diseases classified elsewhere, moderate, with anxiety: Secondary | ICD-10-CM | POA: Diagnosis not present

## 2021-08-16 DIAGNOSIS — E559 Vitamin D deficiency, unspecified: Secondary | ICD-10-CM | POA: Diagnosis not present

## 2021-08-16 DIAGNOSIS — N189 Chronic kidney disease, unspecified: Secondary | ICD-10-CM | POA: Diagnosis not present

## 2021-08-16 DIAGNOSIS — R296 Repeated falls: Secondary | ICD-10-CM | POA: Diagnosis not present

## 2021-08-16 DIAGNOSIS — I130 Hypertensive heart and chronic kidney disease with heart failure and stage 1 through stage 4 chronic kidney disease, or unspecified chronic kidney disease: Secondary | ICD-10-CM | POA: Diagnosis not present

## 2021-08-16 DIAGNOSIS — Z87891 Personal history of nicotine dependence: Secondary | ICD-10-CM | POA: Diagnosis not present

## 2021-08-19 DIAGNOSIS — I447 Left bundle-branch block, unspecified: Secondary | ICD-10-CM | POA: Diagnosis not present

## 2021-08-19 DIAGNOSIS — N189 Chronic kidney disease, unspecified: Secondary | ICD-10-CM | POA: Diagnosis not present

## 2021-08-19 DIAGNOSIS — K219 Gastro-esophageal reflux disease without esophagitis: Secondary | ICD-10-CM | POA: Diagnosis not present

## 2021-08-19 DIAGNOSIS — E785 Hyperlipidemia, unspecified: Secondary | ICD-10-CM | POA: Diagnosis not present

## 2021-08-19 DIAGNOSIS — E559 Vitamin D deficiency, unspecified: Secondary | ICD-10-CM | POA: Diagnosis not present

## 2021-08-19 DIAGNOSIS — M519 Unspecified thoracic, thoracolumbar and lumbosacral intervertebral disc disorder: Secondary | ICD-10-CM | POA: Diagnosis not present

## 2021-08-19 DIAGNOSIS — M1990 Unspecified osteoarthritis, unspecified site: Secondary | ICD-10-CM | POA: Diagnosis not present

## 2021-08-19 DIAGNOSIS — R296 Repeated falls: Secondary | ICD-10-CM | POA: Diagnosis not present

## 2021-08-19 DIAGNOSIS — F02B4 Dementia in other diseases classified elsewhere, moderate, with anxiety: Secondary | ICD-10-CM | POA: Diagnosis not present

## 2021-08-19 DIAGNOSIS — F02B3 Dementia in other diseases classified elsewhere, moderate, with mood disturbance: Secondary | ICD-10-CM | POA: Diagnosis not present

## 2021-08-19 DIAGNOSIS — Z87891 Personal history of nicotine dependence: Secondary | ICD-10-CM | POA: Diagnosis not present

## 2021-08-19 DIAGNOSIS — I130 Hypertensive heart and chronic kidney disease with heart failure and stage 1 through stage 4 chronic kidney disease, or unspecified chronic kidney disease: Secondary | ICD-10-CM | POA: Diagnosis not present

## 2021-08-19 DIAGNOSIS — I509 Heart failure, unspecified: Secondary | ICD-10-CM | POA: Diagnosis not present

## 2021-08-19 DIAGNOSIS — G4720 Circadian rhythm sleep disorder, unspecified type: Secondary | ICD-10-CM | POA: Diagnosis not present

## 2021-08-19 DIAGNOSIS — E1122 Type 2 diabetes mellitus with diabetic chronic kidney disease: Secondary | ICD-10-CM | POA: Diagnosis not present

## 2021-08-19 DIAGNOSIS — G301 Alzheimer's disease with late onset: Secondary | ICD-10-CM | POA: Diagnosis not present

## 2021-08-19 DIAGNOSIS — Z9181 History of falling: Secondary | ICD-10-CM | POA: Diagnosis not present

## 2021-08-20 DIAGNOSIS — K603 Anal fistula: Secondary | ICD-10-CM | POA: Diagnosis not present

## 2021-08-21 DIAGNOSIS — I447 Left bundle-branch block, unspecified: Secondary | ICD-10-CM | POA: Diagnosis not present

## 2021-08-21 DIAGNOSIS — G4720 Circadian rhythm sleep disorder, unspecified type: Secondary | ICD-10-CM | POA: Diagnosis not present

## 2021-08-21 DIAGNOSIS — E1122 Type 2 diabetes mellitus with diabetic chronic kidney disease: Secondary | ICD-10-CM | POA: Diagnosis not present

## 2021-08-21 DIAGNOSIS — F02B4 Dementia in other diseases classified elsewhere, moderate, with anxiety: Secondary | ICD-10-CM | POA: Diagnosis not present

## 2021-08-21 DIAGNOSIS — R296 Repeated falls: Secondary | ICD-10-CM | POA: Diagnosis not present

## 2021-08-21 DIAGNOSIS — I509 Heart failure, unspecified: Secondary | ICD-10-CM | POA: Diagnosis not present

## 2021-08-21 DIAGNOSIS — I130 Hypertensive heart and chronic kidney disease with heart failure and stage 1 through stage 4 chronic kidney disease, or unspecified chronic kidney disease: Secondary | ICD-10-CM | POA: Diagnosis not present

## 2021-08-21 DIAGNOSIS — M519 Unspecified thoracic, thoracolumbar and lumbosacral intervertebral disc disorder: Secondary | ICD-10-CM | POA: Diagnosis not present

## 2021-08-21 DIAGNOSIS — E785 Hyperlipidemia, unspecified: Secondary | ICD-10-CM | POA: Diagnosis not present

## 2021-08-21 DIAGNOSIS — K219 Gastro-esophageal reflux disease without esophagitis: Secondary | ICD-10-CM | POA: Diagnosis not present

## 2021-08-21 DIAGNOSIS — Z9181 History of falling: Secondary | ICD-10-CM | POA: Diagnosis not present

## 2021-08-21 DIAGNOSIS — M1990 Unspecified osteoarthritis, unspecified site: Secondary | ICD-10-CM | POA: Diagnosis not present

## 2021-08-21 DIAGNOSIS — E559 Vitamin D deficiency, unspecified: Secondary | ICD-10-CM | POA: Diagnosis not present

## 2021-08-21 DIAGNOSIS — Z87891 Personal history of nicotine dependence: Secondary | ICD-10-CM | POA: Diagnosis not present

## 2021-08-21 DIAGNOSIS — G301 Alzheimer's disease with late onset: Secondary | ICD-10-CM | POA: Diagnosis not present

## 2021-08-21 DIAGNOSIS — N189 Chronic kidney disease, unspecified: Secondary | ICD-10-CM | POA: Diagnosis not present

## 2021-08-21 DIAGNOSIS — F02B3 Dementia in other diseases classified elsewhere, moderate, with mood disturbance: Secondary | ICD-10-CM | POA: Diagnosis not present

## 2021-08-22 DIAGNOSIS — G4720 Circadian rhythm sleep disorder, unspecified type: Secondary | ICD-10-CM | POA: Diagnosis not present

## 2021-08-22 DIAGNOSIS — E559 Vitamin D deficiency, unspecified: Secondary | ICD-10-CM | POA: Diagnosis not present

## 2021-08-22 DIAGNOSIS — I130 Hypertensive heart and chronic kidney disease with heart failure and stage 1 through stage 4 chronic kidney disease, or unspecified chronic kidney disease: Secondary | ICD-10-CM | POA: Diagnosis not present

## 2021-08-22 DIAGNOSIS — E1122 Type 2 diabetes mellitus with diabetic chronic kidney disease: Secondary | ICD-10-CM | POA: Diagnosis not present

## 2021-08-22 DIAGNOSIS — R296 Repeated falls: Secondary | ICD-10-CM | POA: Diagnosis not present

## 2021-08-22 DIAGNOSIS — N189 Chronic kidney disease, unspecified: Secondary | ICD-10-CM | POA: Diagnosis not present

## 2021-08-22 DIAGNOSIS — F02B4 Dementia in other diseases classified elsewhere, moderate, with anxiety: Secondary | ICD-10-CM | POA: Diagnosis not present

## 2021-08-22 DIAGNOSIS — I509 Heart failure, unspecified: Secondary | ICD-10-CM | POA: Diagnosis not present

## 2021-08-22 DIAGNOSIS — I447 Left bundle-branch block, unspecified: Secondary | ICD-10-CM | POA: Diagnosis not present

## 2021-08-22 DIAGNOSIS — M1990 Unspecified osteoarthritis, unspecified site: Secondary | ICD-10-CM | POA: Diagnosis not present

## 2021-08-22 DIAGNOSIS — M519 Unspecified thoracic, thoracolumbar and lumbosacral intervertebral disc disorder: Secondary | ICD-10-CM | POA: Diagnosis not present

## 2021-08-22 DIAGNOSIS — Z87891 Personal history of nicotine dependence: Secondary | ICD-10-CM | POA: Diagnosis not present

## 2021-08-22 DIAGNOSIS — F02B3 Dementia in other diseases classified elsewhere, moderate, with mood disturbance: Secondary | ICD-10-CM | POA: Diagnosis not present

## 2021-08-22 DIAGNOSIS — G301 Alzheimer's disease with late onset: Secondary | ICD-10-CM | POA: Diagnosis not present

## 2021-08-22 DIAGNOSIS — E785 Hyperlipidemia, unspecified: Secondary | ICD-10-CM | POA: Diagnosis not present

## 2021-08-22 DIAGNOSIS — K219 Gastro-esophageal reflux disease without esophagitis: Secondary | ICD-10-CM | POA: Diagnosis not present

## 2021-08-22 DIAGNOSIS — Z9181 History of falling: Secondary | ICD-10-CM | POA: Diagnosis not present

## 2021-08-26 DIAGNOSIS — E1122 Type 2 diabetes mellitus with diabetic chronic kidney disease: Secondary | ICD-10-CM | POA: Diagnosis not present

## 2021-08-26 DIAGNOSIS — I130 Hypertensive heart and chronic kidney disease with heart failure and stage 1 through stage 4 chronic kidney disease, or unspecified chronic kidney disease: Secondary | ICD-10-CM | POA: Diagnosis not present

## 2021-08-26 DIAGNOSIS — Z87891 Personal history of nicotine dependence: Secondary | ICD-10-CM | POA: Diagnosis not present

## 2021-08-26 DIAGNOSIS — F02B3 Dementia in other diseases classified elsewhere, moderate, with mood disturbance: Secondary | ICD-10-CM | POA: Diagnosis not present

## 2021-08-26 DIAGNOSIS — E559 Vitamin D deficiency, unspecified: Secondary | ICD-10-CM | POA: Diagnosis not present

## 2021-08-26 DIAGNOSIS — M519 Unspecified thoracic, thoracolumbar and lumbosacral intervertebral disc disorder: Secondary | ICD-10-CM | POA: Diagnosis not present

## 2021-08-26 DIAGNOSIS — K219 Gastro-esophageal reflux disease without esophagitis: Secondary | ICD-10-CM | POA: Diagnosis not present

## 2021-08-26 DIAGNOSIS — E785 Hyperlipidemia, unspecified: Secondary | ICD-10-CM | POA: Diagnosis not present

## 2021-08-26 DIAGNOSIS — G4720 Circadian rhythm sleep disorder, unspecified type: Secondary | ICD-10-CM | POA: Diagnosis not present

## 2021-08-26 DIAGNOSIS — G301 Alzheimer's disease with late onset: Secondary | ICD-10-CM | POA: Diagnosis not present

## 2021-08-26 DIAGNOSIS — N189 Chronic kidney disease, unspecified: Secondary | ICD-10-CM | POA: Diagnosis not present

## 2021-08-26 DIAGNOSIS — I509 Heart failure, unspecified: Secondary | ICD-10-CM | POA: Diagnosis not present

## 2021-08-26 DIAGNOSIS — R296 Repeated falls: Secondary | ICD-10-CM | POA: Diagnosis not present

## 2021-08-26 DIAGNOSIS — M1990 Unspecified osteoarthritis, unspecified site: Secondary | ICD-10-CM | POA: Diagnosis not present

## 2021-08-26 DIAGNOSIS — I447 Left bundle-branch block, unspecified: Secondary | ICD-10-CM | POA: Diagnosis not present

## 2021-08-26 DIAGNOSIS — F02B4 Dementia in other diseases classified elsewhere, moderate, with anxiety: Secondary | ICD-10-CM | POA: Diagnosis not present

## 2021-08-26 DIAGNOSIS — Z9181 History of falling: Secondary | ICD-10-CM | POA: Diagnosis not present

## 2021-08-27 DIAGNOSIS — G4720 Circadian rhythm sleep disorder, unspecified type: Secondary | ICD-10-CM | POA: Diagnosis not present

## 2021-08-27 DIAGNOSIS — I447 Left bundle-branch block, unspecified: Secondary | ICD-10-CM | POA: Diagnosis not present

## 2021-08-27 DIAGNOSIS — M519 Unspecified thoracic, thoracolumbar and lumbosacral intervertebral disc disorder: Secondary | ICD-10-CM | POA: Diagnosis not present

## 2021-08-27 DIAGNOSIS — R296 Repeated falls: Secondary | ICD-10-CM | POA: Diagnosis not present

## 2021-08-27 DIAGNOSIS — M1990 Unspecified osteoarthritis, unspecified site: Secondary | ICD-10-CM | POA: Diagnosis not present

## 2021-08-27 DIAGNOSIS — I509 Heart failure, unspecified: Secondary | ICD-10-CM | POA: Diagnosis not present

## 2021-08-27 DIAGNOSIS — G301 Alzheimer's disease with late onset: Secondary | ICD-10-CM | POA: Diagnosis not present

## 2021-08-27 DIAGNOSIS — E559 Vitamin D deficiency, unspecified: Secondary | ICD-10-CM | POA: Diagnosis not present

## 2021-08-27 DIAGNOSIS — F02B3 Dementia in other diseases classified elsewhere, moderate, with mood disturbance: Secondary | ICD-10-CM | POA: Diagnosis not present

## 2021-08-27 DIAGNOSIS — I130 Hypertensive heart and chronic kidney disease with heart failure and stage 1 through stage 4 chronic kidney disease, or unspecified chronic kidney disease: Secondary | ICD-10-CM | POA: Diagnosis not present

## 2021-08-27 DIAGNOSIS — E785 Hyperlipidemia, unspecified: Secondary | ICD-10-CM | POA: Diagnosis not present

## 2021-08-27 DIAGNOSIS — N189 Chronic kidney disease, unspecified: Secondary | ICD-10-CM | POA: Diagnosis not present

## 2021-08-27 DIAGNOSIS — Z9181 History of falling: Secondary | ICD-10-CM | POA: Diagnosis not present

## 2021-08-27 DIAGNOSIS — Z87891 Personal history of nicotine dependence: Secondary | ICD-10-CM | POA: Diagnosis not present

## 2021-08-27 DIAGNOSIS — E1122 Type 2 diabetes mellitus with diabetic chronic kidney disease: Secondary | ICD-10-CM | POA: Diagnosis not present

## 2021-08-27 DIAGNOSIS — F02B4 Dementia in other diseases classified elsewhere, moderate, with anxiety: Secondary | ICD-10-CM | POA: Diagnosis not present

## 2021-08-27 DIAGNOSIS — K219 Gastro-esophageal reflux disease without esophagitis: Secondary | ICD-10-CM | POA: Diagnosis not present

## 2021-08-28 DIAGNOSIS — F02B4 Dementia in other diseases classified elsewhere, moderate, with anxiety: Secondary | ICD-10-CM | POA: Diagnosis not present

## 2021-08-28 DIAGNOSIS — Z87891 Personal history of nicotine dependence: Secondary | ICD-10-CM | POA: Diagnosis not present

## 2021-08-28 DIAGNOSIS — E1122 Type 2 diabetes mellitus with diabetic chronic kidney disease: Secondary | ICD-10-CM | POA: Diagnosis not present

## 2021-08-28 DIAGNOSIS — I509 Heart failure, unspecified: Secondary | ICD-10-CM | POA: Diagnosis not present

## 2021-08-28 DIAGNOSIS — E559 Vitamin D deficiency, unspecified: Secondary | ICD-10-CM | POA: Diagnosis not present

## 2021-08-28 DIAGNOSIS — G4720 Circadian rhythm sleep disorder, unspecified type: Secondary | ICD-10-CM | POA: Diagnosis not present

## 2021-08-28 DIAGNOSIS — I447 Left bundle-branch block, unspecified: Secondary | ICD-10-CM | POA: Diagnosis not present

## 2021-08-28 DIAGNOSIS — F02B3 Dementia in other diseases classified elsewhere, moderate, with mood disturbance: Secondary | ICD-10-CM | POA: Diagnosis not present

## 2021-08-28 DIAGNOSIS — E785 Hyperlipidemia, unspecified: Secondary | ICD-10-CM | POA: Diagnosis not present

## 2021-08-28 DIAGNOSIS — G301 Alzheimer's disease with late onset: Secondary | ICD-10-CM | POA: Diagnosis not present

## 2021-08-28 DIAGNOSIS — K219 Gastro-esophageal reflux disease without esophagitis: Secondary | ICD-10-CM | POA: Diagnosis not present

## 2021-08-28 DIAGNOSIS — R296 Repeated falls: Secondary | ICD-10-CM | POA: Diagnosis not present

## 2021-08-28 DIAGNOSIS — N189 Chronic kidney disease, unspecified: Secondary | ICD-10-CM | POA: Diagnosis not present

## 2021-08-28 DIAGNOSIS — M1990 Unspecified osteoarthritis, unspecified site: Secondary | ICD-10-CM | POA: Diagnosis not present

## 2021-08-28 DIAGNOSIS — M519 Unspecified thoracic, thoracolumbar and lumbosacral intervertebral disc disorder: Secondary | ICD-10-CM | POA: Diagnosis not present

## 2021-08-28 DIAGNOSIS — Z9181 History of falling: Secondary | ICD-10-CM | POA: Diagnosis not present

## 2021-08-28 DIAGNOSIS — I130 Hypertensive heart and chronic kidney disease with heart failure and stage 1 through stage 4 chronic kidney disease, or unspecified chronic kidney disease: Secondary | ICD-10-CM | POA: Diagnosis not present

## 2021-08-29 DIAGNOSIS — M1990 Unspecified osteoarthritis, unspecified site: Secondary | ICD-10-CM | POA: Diagnosis not present

## 2021-08-29 DIAGNOSIS — R296 Repeated falls: Secondary | ICD-10-CM | POA: Diagnosis not present

## 2021-08-29 DIAGNOSIS — I129 Hypertensive chronic kidney disease with stage 1 through stage 4 chronic kidney disease, or unspecified chronic kidney disease: Secondary | ICD-10-CM | POA: Diagnosis not present

## 2021-08-29 DIAGNOSIS — I509 Heart failure, unspecified: Secondary | ICD-10-CM | POA: Diagnosis not present

## 2021-08-29 DIAGNOSIS — D631 Anemia in chronic kidney disease: Secondary | ICD-10-CM | POA: Diagnosis not present

## 2021-08-29 DIAGNOSIS — N179 Acute kidney failure, unspecified: Secondary | ICD-10-CM | POA: Diagnosis not present

## 2021-08-29 DIAGNOSIS — N1832 Chronic kidney disease, stage 3b: Secondary | ICD-10-CM | POA: Diagnosis not present

## 2021-08-29 DIAGNOSIS — E559 Vitamin D deficiency, unspecified: Secondary | ICD-10-CM | POA: Diagnosis not present

## 2021-08-29 DIAGNOSIS — G4720 Circadian rhythm sleep disorder, unspecified type: Secondary | ICD-10-CM | POA: Diagnosis not present

## 2021-08-29 DIAGNOSIS — E785 Hyperlipidemia, unspecified: Secondary | ICD-10-CM | POA: Diagnosis not present

## 2021-08-29 DIAGNOSIS — G301 Alzheimer's disease with late onset: Secondary | ICD-10-CM | POA: Diagnosis not present

## 2021-08-29 DIAGNOSIS — Z87891 Personal history of nicotine dependence: Secondary | ICD-10-CM | POA: Diagnosis not present

## 2021-08-29 DIAGNOSIS — K219 Gastro-esophageal reflux disease without esophagitis: Secondary | ICD-10-CM | POA: Diagnosis not present

## 2021-08-29 DIAGNOSIS — Z9181 History of falling: Secondary | ICD-10-CM | POA: Diagnosis not present

## 2021-08-29 DIAGNOSIS — M519 Unspecified thoracic, thoracolumbar and lumbosacral intervertebral disc disorder: Secondary | ICD-10-CM | POA: Diagnosis not present

## 2021-08-29 DIAGNOSIS — I130 Hypertensive heart and chronic kidney disease with heart failure and stage 1 through stage 4 chronic kidney disease, or unspecified chronic kidney disease: Secondary | ICD-10-CM | POA: Diagnosis not present

## 2021-08-29 DIAGNOSIS — N189 Chronic kidney disease, unspecified: Secondary | ICD-10-CM | POA: Diagnosis not present

## 2021-08-29 DIAGNOSIS — F02B4 Dementia in other diseases classified elsewhere, moderate, with anxiety: Secondary | ICD-10-CM | POA: Diagnosis not present

## 2021-08-29 DIAGNOSIS — F02B3 Dementia in other diseases classified elsewhere, moderate, with mood disturbance: Secondary | ICD-10-CM | POA: Diagnosis not present

## 2021-08-29 DIAGNOSIS — I447 Left bundle-branch block, unspecified: Secondary | ICD-10-CM | POA: Diagnosis not present

## 2021-08-29 DIAGNOSIS — E1122 Type 2 diabetes mellitus with diabetic chronic kidney disease: Secondary | ICD-10-CM | POA: Diagnosis not present

## 2021-09-02 DIAGNOSIS — E559 Vitamin D deficiency, unspecified: Secondary | ICD-10-CM | POA: Diagnosis not present

## 2021-09-02 DIAGNOSIS — F02B4 Dementia in other diseases classified elsewhere, moderate, with anxiety: Secondary | ICD-10-CM | POA: Diagnosis not present

## 2021-09-02 DIAGNOSIS — I447 Left bundle-branch block, unspecified: Secondary | ICD-10-CM | POA: Diagnosis not present

## 2021-09-02 DIAGNOSIS — R296 Repeated falls: Secondary | ICD-10-CM | POA: Diagnosis not present

## 2021-09-02 DIAGNOSIS — Z9181 History of falling: Secondary | ICD-10-CM | POA: Diagnosis not present

## 2021-09-02 DIAGNOSIS — G4720 Circadian rhythm sleep disorder, unspecified type: Secondary | ICD-10-CM | POA: Diagnosis not present

## 2021-09-02 DIAGNOSIS — I130 Hypertensive heart and chronic kidney disease with heart failure and stage 1 through stage 4 chronic kidney disease, or unspecified chronic kidney disease: Secondary | ICD-10-CM | POA: Diagnosis not present

## 2021-09-02 DIAGNOSIS — G301 Alzheimer's disease with late onset: Secondary | ICD-10-CM | POA: Diagnosis not present

## 2021-09-02 DIAGNOSIS — M1990 Unspecified osteoarthritis, unspecified site: Secondary | ICD-10-CM | POA: Diagnosis not present

## 2021-09-02 DIAGNOSIS — E785 Hyperlipidemia, unspecified: Secondary | ICD-10-CM | POA: Diagnosis not present

## 2021-09-02 DIAGNOSIS — E1122 Type 2 diabetes mellitus with diabetic chronic kidney disease: Secondary | ICD-10-CM | POA: Diagnosis not present

## 2021-09-02 DIAGNOSIS — K219 Gastro-esophageal reflux disease without esophagitis: Secondary | ICD-10-CM | POA: Diagnosis not present

## 2021-09-02 DIAGNOSIS — Z87891 Personal history of nicotine dependence: Secondary | ICD-10-CM | POA: Diagnosis not present

## 2021-09-02 DIAGNOSIS — M519 Unspecified thoracic, thoracolumbar and lumbosacral intervertebral disc disorder: Secondary | ICD-10-CM | POA: Diagnosis not present

## 2021-09-02 DIAGNOSIS — N189 Chronic kidney disease, unspecified: Secondary | ICD-10-CM | POA: Diagnosis not present

## 2021-09-02 DIAGNOSIS — I509 Heart failure, unspecified: Secondary | ICD-10-CM | POA: Diagnosis not present

## 2021-09-02 DIAGNOSIS — F02B3 Dementia in other diseases classified elsewhere, moderate, with mood disturbance: Secondary | ICD-10-CM | POA: Diagnosis not present

## 2021-09-03 DIAGNOSIS — G301 Alzheimer's disease with late onset: Secondary | ICD-10-CM | POA: Diagnosis not present

## 2021-09-03 DIAGNOSIS — R296 Repeated falls: Secondary | ICD-10-CM | POA: Diagnosis not present

## 2021-09-03 DIAGNOSIS — Z87891 Personal history of nicotine dependence: Secondary | ICD-10-CM | POA: Diagnosis not present

## 2021-09-03 DIAGNOSIS — F02B4 Dementia in other diseases classified elsewhere, moderate, with anxiety: Secondary | ICD-10-CM | POA: Diagnosis not present

## 2021-09-03 DIAGNOSIS — I447 Left bundle-branch block, unspecified: Secondary | ICD-10-CM | POA: Diagnosis not present

## 2021-09-03 DIAGNOSIS — E785 Hyperlipidemia, unspecified: Secondary | ICD-10-CM | POA: Diagnosis not present

## 2021-09-03 DIAGNOSIS — E559 Vitamin D deficiency, unspecified: Secondary | ICD-10-CM | POA: Diagnosis not present

## 2021-09-03 DIAGNOSIS — I509 Heart failure, unspecified: Secondary | ICD-10-CM | POA: Diagnosis not present

## 2021-09-03 DIAGNOSIS — E1122 Type 2 diabetes mellitus with diabetic chronic kidney disease: Secondary | ICD-10-CM | POA: Diagnosis not present

## 2021-09-03 DIAGNOSIS — M1990 Unspecified osteoarthritis, unspecified site: Secondary | ICD-10-CM | POA: Diagnosis not present

## 2021-09-03 DIAGNOSIS — K219 Gastro-esophageal reflux disease without esophagitis: Secondary | ICD-10-CM | POA: Diagnosis not present

## 2021-09-03 DIAGNOSIS — F02B3 Dementia in other diseases classified elsewhere, moderate, with mood disturbance: Secondary | ICD-10-CM | POA: Diagnosis not present

## 2021-09-03 DIAGNOSIS — M519 Unspecified thoracic, thoracolumbar and lumbosacral intervertebral disc disorder: Secondary | ICD-10-CM | POA: Diagnosis not present

## 2021-09-03 DIAGNOSIS — N1832 Chronic kidney disease, stage 3b: Secondary | ICD-10-CM | POA: Diagnosis not present

## 2021-09-03 DIAGNOSIS — N189 Chronic kidney disease, unspecified: Secondary | ICD-10-CM | POA: Diagnosis not present

## 2021-09-03 DIAGNOSIS — Z9181 History of falling: Secondary | ICD-10-CM | POA: Diagnosis not present

## 2021-09-03 DIAGNOSIS — G4720 Circadian rhythm sleep disorder, unspecified type: Secondary | ICD-10-CM | POA: Diagnosis not present

## 2021-09-03 DIAGNOSIS — I130 Hypertensive heart and chronic kidney disease with heart failure and stage 1 through stage 4 chronic kidney disease, or unspecified chronic kidney disease: Secondary | ICD-10-CM | POA: Diagnosis not present

## 2021-09-05 DIAGNOSIS — N1831 Chronic kidney disease, stage 3a: Secondary | ICD-10-CM | POA: Diagnosis not present

## 2021-09-05 DIAGNOSIS — I130 Hypertensive heart and chronic kidney disease with heart failure and stage 1 through stage 4 chronic kidney disease, or unspecified chronic kidney disease: Secondary | ICD-10-CM | POA: Diagnosis not present

## 2021-09-05 DIAGNOSIS — E785 Hyperlipidemia, unspecified: Secondary | ICD-10-CM | POA: Diagnosis not present

## 2021-09-05 DIAGNOSIS — I5032 Chronic diastolic (congestive) heart failure: Secondary | ICD-10-CM | POA: Diagnosis not present

## 2021-09-06 DIAGNOSIS — G301 Alzheimer's disease with late onset: Secondary | ICD-10-CM | POA: Diagnosis not present

## 2021-09-06 DIAGNOSIS — I509 Heart failure, unspecified: Secondary | ICD-10-CM | POA: Diagnosis not present

## 2021-09-06 DIAGNOSIS — Z87891 Personal history of nicotine dependence: Secondary | ICD-10-CM | POA: Diagnosis not present

## 2021-09-06 DIAGNOSIS — F02B3 Dementia in other diseases classified elsewhere, moderate, with mood disturbance: Secondary | ICD-10-CM | POA: Diagnosis not present

## 2021-09-06 DIAGNOSIS — I130 Hypertensive heart and chronic kidney disease with heart failure and stage 1 through stage 4 chronic kidney disease, or unspecified chronic kidney disease: Secondary | ICD-10-CM | POA: Diagnosis not present

## 2021-09-06 DIAGNOSIS — E1122 Type 2 diabetes mellitus with diabetic chronic kidney disease: Secondary | ICD-10-CM | POA: Diagnosis not present

## 2021-09-06 DIAGNOSIS — N189 Chronic kidney disease, unspecified: Secondary | ICD-10-CM | POA: Diagnosis not present

## 2021-09-06 DIAGNOSIS — K219 Gastro-esophageal reflux disease without esophagitis: Secondary | ICD-10-CM | POA: Diagnosis not present

## 2021-09-06 DIAGNOSIS — M1990 Unspecified osteoarthritis, unspecified site: Secondary | ICD-10-CM | POA: Diagnosis not present

## 2021-09-06 DIAGNOSIS — E785 Hyperlipidemia, unspecified: Secondary | ICD-10-CM | POA: Diagnosis not present

## 2021-09-06 DIAGNOSIS — M519 Unspecified thoracic, thoracolumbar and lumbosacral intervertebral disc disorder: Secondary | ICD-10-CM | POA: Diagnosis not present

## 2021-09-06 DIAGNOSIS — R296 Repeated falls: Secondary | ICD-10-CM | POA: Diagnosis not present

## 2021-09-06 DIAGNOSIS — E559 Vitamin D deficiency, unspecified: Secondary | ICD-10-CM | POA: Diagnosis not present

## 2021-09-06 DIAGNOSIS — I447 Left bundle-branch block, unspecified: Secondary | ICD-10-CM | POA: Diagnosis not present

## 2021-09-06 DIAGNOSIS — G4720 Circadian rhythm sleep disorder, unspecified type: Secondary | ICD-10-CM | POA: Diagnosis not present

## 2021-09-06 DIAGNOSIS — F02B4 Dementia in other diseases classified elsewhere, moderate, with anxiety: Secondary | ICD-10-CM | POA: Diagnosis not present

## 2021-09-06 DIAGNOSIS — Z9181 History of falling: Secondary | ICD-10-CM | POA: Diagnosis not present

## 2021-09-10 DIAGNOSIS — I509 Heart failure, unspecified: Secondary | ICD-10-CM | POA: Diagnosis not present

## 2021-09-10 DIAGNOSIS — I447 Left bundle-branch block, unspecified: Secondary | ICD-10-CM | POA: Diagnosis not present

## 2021-09-10 DIAGNOSIS — M519 Unspecified thoracic, thoracolumbar and lumbosacral intervertebral disc disorder: Secondary | ICD-10-CM | POA: Diagnosis not present

## 2021-09-10 DIAGNOSIS — Z9181 History of falling: Secondary | ICD-10-CM | POA: Diagnosis not present

## 2021-09-10 DIAGNOSIS — M1990 Unspecified osteoarthritis, unspecified site: Secondary | ICD-10-CM | POA: Diagnosis not present

## 2021-09-10 DIAGNOSIS — G301 Alzheimer's disease with late onset: Secondary | ICD-10-CM | POA: Diagnosis not present

## 2021-09-10 DIAGNOSIS — I130 Hypertensive heart and chronic kidney disease with heart failure and stage 1 through stage 4 chronic kidney disease, or unspecified chronic kidney disease: Secondary | ICD-10-CM | POA: Diagnosis not present

## 2021-09-10 DIAGNOSIS — F02B4 Dementia in other diseases classified elsewhere, moderate, with anxiety: Secondary | ICD-10-CM | POA: Diagnosis not present

## 2021-09-10 DIAGNOSIS — F02B3 Dementia in other diseases classified elsewhere, moderate, with mood disturbance: Secondary | ICD-10-CM | POA: Diagnosis not present

## 2021-09-10 DIAGNOSIS — E785 Hyperlipidemia, unspecified: Secondary | ICD-10-CM | POA: Diagnosis not present

## 2021-09-10 DIAGNOSIS — K219 Gastro-esophageal reflux disease without esophagitis: Secondary | ICD-10-CM | POA: Diagnosis not present

## 2021-09-10 DIAGNOSIS — Z87891 Personal history of nicotine dependence: Secondary | ICD-10-CM | POA: Diagnosis not present

## 2021-09-10 DIAGNOSIS — E1122 Type 2 diabetes mellitus with diabetic chronic kidney disease: Secondary | ICD-10-CM | POA: Diagnosis not present

## 2021-09-10 DIAGNOSIS — G4720 Circadian rhythm sleep disorder, unspecified type: Secondary | ICD-10-CM | POA: Diagnosis not present

## 2021-09-10 DIAGNOSIS — N189 Chronic kidney disease, unspecified: Secondary | ICD-10-CM | POA: Diagnosis not present

## 2021-09-10 DIAGNOSIS — R296 Repeated falls: Secondary | ICD-10-CM | POA: Diagnosis not present

## 2021-09-10 DIAGNOSIS — E559 Vitamin D deficiency, unspecified: Secondary | ICD-10-CM | POA: Diagnosis not present

## 2021-09-11 DIAGNOSIS — E1122 Type 2 diabetes mellitus with diabetic chronic kidney disease: Secondary | ICD-10-CM | POA: Diagnosis not present

## 2021-09-11 DIAGNOSIS — Z87891 Personal history of nicotine dependence: Secondary | ICD-10-CM | POA: Diagnosis not present

## 2021-09-11 DIAGNOSIS — M519 Unspecified thoracic, thoracolumbar and lumbosacral intervertebral disc disorder: Secondary | ICD-10-CM | POA: Diagnosis not present

## 2021-09-11 DIAGNOSIS — I447 Left bundle-branch block, unspecified: Secondary | ICD-10-CM | POA: Diagnosis not present

## 2021-09-11 DIAGNOSIS — G301 Alzheimer's disease with late onset: Secondary | ICD-10-CM | POA: Diagnosis not present

## 2021-09-11 DIAGNOSIS — E785 Hyperlipidemia, unspecified: Secondary | ICD-10-CM | POA: Diagnosis not present

## 2021-09-11 DIAGNOSIS — M1990 Unspecified osteoarthritis, unspecified site: Secondary | ICD-10-CM | POA: Diagnosis not present

## 2021-09-11 DIAGNOSIS — E559 Vitamin D deficiency, unspecified: Secondary | ICD-10-CM | POA: Diagnosis not present

## 2021-09-11 DIAGNOSIS — Z9181 History of falling: Secondary | ICD-10-CM | POA: Diagnosis not present

## 2021-09-11 DIAGNOSIS — N189 Chronic kidney disease, unspecified: Secondary | ICD-10-CM | POA: Diagnosis not present

## 2021-09-11 DIAGNOSIS — K219 Gastro-esophageal reflux disease without esophagitis: Secondary | ICD-10-CM | POA: Diagnosis not present

## 2021-09-11 DIAGNOSIS — G4720 Circadian rhythm sleep disorder, unspecified type: Secondary | ICD-10-CM | POA: Diagnosis not present

## 2021-09-11 DIAGNOSIS — F02B4 Dementia in other diseases classified elsewhere, moderate, with anxiety: Secondary | ICD-10-CM | POA: Diagnosis not present

## 2021-09-11 DIAGNOSIS — I130 Hypertensive heart and chronic kidney disease with heart failure and stage 1 through stage 4 chronic kidney disease, or unspecified chronic kidney disease: Secondary | ICD-10-CM | POA: Diagnosis not present

## 2021-09-11 DIAGNOSIS — F02B3 Dementia in other diseases classified elsewhere, moderate, with mood disturbance: Secondary | ICD-10-CM | POA: Diagnosis not present

## 2021-09-11 DIAGNOSIS — R296 Repeated falls: Secondary | ICD-10-CM | POA: Diagnosis not present

## 2021-09-11 DIAGNOSIS — I509 Heart failure, unspecified: Secondary | ICD-10-CM | POA: Diagnosis not present

## 2021-09-12 DIAGNOSIS — E1122 Type 2 diabetes mellitus with diabetic chronic kidney disease: Secondary | ICD-10-CM | POA: Diagnosis not present

## 2021-09-12 DIAGNOSIS — G4720 Circadian rhythm sleep disorder, unspecified type: Secondary | ICD-10-CM | POA: Diagnosis not present

## 2021-09-12 DIAGNOSIS — I509 Heart failure, unspecified: Secondary | ICD-10-CM | POA: Diagnosis not present

## 2021-09-12 DIAGNOSIS — G301 Alzheimer's disease with late onset: Secondary | ICD-10-CM | POA: Diagnosis not present

## 2021-09-12 DIAGNOSIS — R296 Repeated falls: Secondary | ICD-10-CM | POA: Diagnosis not present

## 2021-09-12 DIAGNOSIS — E559 Vitamin D deficiency, unspecified: Secondary | ICD-10-CM | POA: Diagnosis not present

## 2021-09-12 DIAGNOSIS — F02B4 Dementia in other diseases classified elsewhere, moderate, with anxiety: Secondary | ICD-10-CM | POA: Diagnosis not present

## 2021-09-12 DIAGNOSIS — Z87891 Personal history of nicotine dependence: Secondary | ICD-10-CM | POA: Diagnosis not present

## 2021-09-12 DIAGNOSIS — F02B3 Dementia in other diseases classified elsewhere, moderate, with mood disturbance: Secondary | ICD-10-CM | POA: Diagnosis not present

## 2021-09-12 DIAGNOSIS — I447 Left bundle-branch block, unspecified: Secondary | ICD-10-CM | POA: Diagnosis not present

## 2021-09-12 DIAGNOSIS — E785 Hyperlipidemia, unspecified: Secondary | ICD-10-CM | POA: Diagnosis not present

## 2021-09-12 DIAGNOSIS — N189 Chronic kidney disease, unspecified: Secondary | ICD-10-CM | POA: Diagnosis not present

## 2021-09-12 DIAGNOSIS — K219 Gastro-esophageal reflux disease without esophagitis: Secondary | ICD-10-CM | POA: Diagnosis not present

## 2021-09-12 DIAGNOSIS — M519 Unspecified thoracic, thoracolumbar and lumbosacral intervertebral disc disorder: Secondary | ICD-10-CM | POA: Diagnosis not present

## 2021-09-12 DIAGNOSIS — I130 Hypertensive heart and chronic kidney disease with heart failure and stage 1 through stage 4 chronic kidney disease, or unspecified chronic kidney disease: Secondary | ICD-10-CM | POA: Diagnosis not present

## 2021-09-12 DIAGNOSIS — Z9181 History of falling: Secondary | ICD-10-CM | POA: Diagnosis not present

## 2021-09-12 DIAGNOSIS — M1990 Unspecified osteoarthritis, unspecified site: Secondary | ICD-10-CM | POA: Diagnosis not present

## 2021-09-13 ENCOUNTER — Emergency Department (HOSPITAL_COMMUNITY): Payer: Medicare Other

## 2021-09-13 ENCOUNTER — Inpatient Hospital Stay (HOSPITAL_COMMUNITY)
Admission: EM | Admit: 2021-09-13 | Discharge: 2021-09-17 | DRG: 682 | Disposition: A | Discharge status: Home Health | Payer: Medicare Other | Attending: Internal Medicine | Admitting: Internal Medicine

## 2021-09-13 ENCOUNTER — Telehealth: Payer: Self-pay | Admitting: Neurology

## 2021-09-13 ENCOUNTER — Other Ambulatory Visit: Payer: Self-pay

## 2021-09-13 DIAGNOSIS — Z87891 Personal history of nicotine dependence: Secondary | ICD-10-CM | POA: Diagnosis not present

## 2021-09-13 DIAGNOSIS — D638 Anemia in other chronic diseases classified elsewhere: Secondary | ICD-10-CM | POA: Diagnosis not present

## 2021-09-13 DIAGNOSIS — N401 Enlarged prostate with lower urinary tract symptoms: Secondary | ICD-10-CM | POA: Diagnosis present

## 2021-09-13 DIAGNOSIS — N183 Chronic kidney disease, stage 3 unspecified: Secondary | ICD-10-CM | POA: Diagnosis not present

## 2021-09-13 DIAGNOSIS — F02B3 Dementia in other diseases classified elsewhere, moderate, with mood disturbance: Secondary | ICD-10-CM | POA: Diagnosis not present

## 2021-09-13 DIAGNOSIS — G309 Alzheimer's disease, unspecified: Secondary | ICD-10-CM | POA: Diagnosis not present

## 2021-09-13 DIAGNOSIS — I10 Essential (primary) hypertension: Secondary | ICD-10-CM | POA: Diagnosis present

## 2021-09-13 DIAGNOSIS — M1990 Unspecified osteoarthritis, unspecified site: Secondary | ICD-10-CM | POA: Diagnosis not present

## 2021-09-13 DIAGNOSIS — R338 Other retention of urine: Secondary | ICD-10-CM

## 2021-09-13 DIAGNOSIS — D696 Thrombocytopenia, unspecified: Secondary | ICD-10-CM | POA: Diagnosis present

## 2021-09-13 DIAGNOSIS — W19XXXA Unspecified fall, initial encounter: Secondary | ICD-10-CM | POA: Diagnosis not present

## 2021-09-13 DIAGNOSIS — Z96641 Presence of right artificial hip joint: Secondary | ICD-10-CM | POA: Diagnosis present

## 2021-09-13 DIAGNOSIS — D631 Anemia in chronic kidney disease: Secondary | ICD-10-CM | POA: Diagnosis present

## 2021-09-13 DIAGNOSIS — I447 Left bundle-branch block, unspecified: Secondary | ICD-10-CM | POA: Diagnosis present

## 2021-09-13 DIAGNOSIS — Z820 Family history of epilepsy and other diseases of the nervous system: Secondary | ICD-10-CM

## 2021-09-13 DIAGNOSIS — G319 Degenerative disease of nervous system, unspecified: Secondary | ICD-10-CM | POA: Diagnosis not present

## 2021-09-13 DIAGNOSIS — N179 Acute kidney failure, unspecified: Secondary | ICD-10-CM | POA: Diagnosis not present

## 2021-09-13 DIAGNOSIS — Z20822 Contact with and (suspected) exposure to covid-19: Secondary | ICD-10-CM | POA: Diagnosis present

## 2021-09-13 DIAGNOSIS — F0282 Dementia in other diseases classified elsewhere, unspecified severity, with psychotic disturbance: Secondary | ICD-10-CM | POA: Diagnosis not present

## 2021-09-13 DIAGNOSIS — R339 Retention of urine, unspecified: Secondary | ICD-10-CM | POA: Diagnosis present

## 2021-09-13 DIAGNOSIS — R296 Repeated falls: Secondary | ICD-10-CM | POA: Diagnosis not present

## 2021-09-13 DIAGNOSIS — I509 Heart failure, unspecified: Secondary | ICD-10-CM | POA: Diagnosis not present

## 2021-09-13 DIAGNOSIS — G301 Alzheimer's disease with late onset: Secondary | ICD-10-CM | POA: Diagnosis not present

## 2021-09-13 DIAGNOSIS — G9341 Metabolic encephalopathy: Secondary | ICD-10-CM | POA: Diagnosis not present

## 2021-09-13 DIAGNOSIS — G4733 Obstructive sleep apnea (adult) (pediatric): Secondary | ICD-10-CM | POA: Diagnosis present

## 2021-09-13 DIAGNOSIS — K219 Gastro-esophageal reflux disease without esophagitis: Secondary | ICD-10-CM | POA: Diagnosis present

## 2021-09-13 DIAGNOSIS — I130 Hypertensive heart and chronic kidney disease with heart failure and stage 1 through stage 4 chronic kidney disease, or unspecified chronic kidney disease: Secondary | ICD-10-CM | POA: Diagnosis present

## 2021-09-13 DIAGNOSIS — R9431 Abnormal electrocardiogram [ECG] [EKG]: Secondary | ICD-10-CM | POA: Diagnosis present

## 2021-09-13 DIAGNOSIS — G4720 Circadian rhythm sleep disorder, unspecified type: Secondary | ICD-10-CM | POA: Diagnosis not present

## 2021-09-13 DIAGNOSIS — F02818 Dementia in other diseases classified elsewhere, unspecified severity, with other behavioral disturbance: Secondary | ICD-10-CM | POA: Diagnosis not present

## 2021-09-13 DIAGNOSIS — Z9181 History of falling: Secondary | ICD-10-CM | POA: Diagnosis not present

## 2021-09-13 DIAGNOSIS — R410 Disorientation, unspecified: Principal | ICD-10-CM

## 2021-09-13 DIAGNOSIS — E113211 Type 2 diabetes mellitus with mild nonproliferative diabetic retinopathy with macular edema, right eye: Secondary | ICD-10-CM | POA: Diagnosis not present

## 2021-09-13 DIAGNOSIS — N281 Cyst of kidney, acquired: Secondary | ICD-10-CM | POA: Diagnosis not present

## 2021-09-13 DIAGNOSIS — Z7189 Other specified counseling: Secondary | ICD-10-CM | POA: Diagnosis not present

## 2021-09-13 DIAGNOSIS — M47816 Spondylosis without myelopathy or radiculopathy, lumbar region: Secondary | ICD-10-CM | POA: Diagnosis not present

## 2021-09-13 DIAGNOSIS — E876 Hypokalemia: Secondary | ICD-10-CM | POA: Diagnosis not present

## 2021-09-13 DIAGNOSIS — F02B4 Dementia in other diseases classified elsewhere, moderate, with anxiety: Secondary | ICD-10-CM | POA: Diagnosis not present

## 2021-09-13 DIAGNOSIS — N189 Chronic kidney disease, unspecified: Secondary | ICD-10-CM | POA: Diagnosis not present

## 2021-09-13 DIAGNOSIS — E1122 Type 2 diabetes mellitus with diabetic chronic kidney disease: Secondary | ICD-10-CM | POA: Diagnosis present

## 2021-09-13 DIAGNOSIS — I5032 Chronic diastolic (congestive) heart failure: Secondary | ICD-10-CM | POA: Diagnosis present

## 2021-09-13 DIAGNOSIS — Z833 Family history of diabetes mellitus: Secondary | ICD-10-CM

## 2021-09-13 DIAGNOSIS — M519 Unspecified thoracic, thoracolumbar and lumbosacral intervertebral disc disorder: Secondary | ICD-10-CM | POA: Diagnosis not present

## 2021-09-13 DIAGNOSIS — Z66 Do not resuscitate: Secondary | ICD-10-CM | POA: Diagnosis not present

## 2021-09-13 DIAGNOSIS — I499 Cardiac arrhythmia, unspecified: Secondary | ICD-10-CM | POA: Diagnosis not present

## 2021-09-13 DIAGNOSIS — Z79899 Other long term (current) drug therapy: Secondary | ICD-10-CM

## 2021-09-13 DIAGNOSIS — Z515 Encounter for palliative care: Secondary | ICD-10-CM | POA: Diagnosis not present

## 2021-09-13 DIAGNOSIS — M47812 Spondylosis without myelopathy or radiculopathy, cervical region: Secondary | ICD-10-CM | POA: Diagnosis not present

## 2021-09-13 DIAGNOSIS — E785 Hyperlipidemia, unspecified: Secondary | ICD-10-CM | POA: Diagnosis not present

## 2021-09-13 DIAGNOSIS — E870 Hyperosmolality and hypernatremia: Secondary | ICD-10-CM | POA: Diagnosis not present

## 2021-09-13 DIAGNOSIS — Z8249 Family history of ischemic heart disease and other diseases of the circulatory system: Secondary | ICD-10-CM

## 2021-09-13 DIAGNOSIS — Z743 Need for continuous supervision: Secondary | ICD-10-CM | POA: Diagnosis not present

## 2021-09-13 DIAGNOSIS — G9340 Encephalopathy, unspecified: Secondary | ICD-10-CM | POA: Diagnosis not present

## 2021-09-13 DIAGNOSIS — R404 Transient alteration of awareness: Secondary | ICD-10-CM | POA: Diagnosis not present

## 2021-09-13 DIAGNOSIS — S299XXA Unspecified injury of thorax, initial encounter: Secondary | ICD-10-CM | POA: Diagnosis not present

## 2021-09-13 DIAGNOSIS — Z043 Encounter for examination and observation following other accident: Secondary | ICD-10-CM | POA: Diagnosis not present

## 2021-09-13 DIAGNOSIS — R531 Weakness: Secondary | ICD-10-CM | POA: Diagnosis not present

## 2021-09-13 DIAGNOSIS — E559 Vitamin D deficiency, unspecified: Secondary | ICD-10-CM | POA: Diagnosis not present

## 2021-09-13 DIAGNOSIS — S0990XA Unspecified injury of head, initial encounter: Secondary | ICD-10-CM | POA: Diagnosis not present

## 2021-09-13 LAB — COMPREHENSIVE METABOLIC PANEL
ALT: 13 U/L (ref 0–44)
AST: 23 U/L (ref 15–41)
Albumin: 4 g/dL (ref 3.5–5.0)
Alkaline Phosphatase: 68 U/L (ref 38–126)
Anion gap: 11 (ref 5–15)
BUN: 36 mg/dL — ABNORMAL HIGH (ref 8–23)
CO2: 19 mmol/L — ABNORMAL LOW (ref 22–32)
Calcium: 9.6 mg/dL (ref 8.9–10.3)
Chloride: 114 mmol/L — ABNORMAL HIGH (ref 98–111)
Creatinine, Ser: 2.61 mg/dL — ABNORMAL HIGH (ref 0.61–1.24)
GFR, Estimated: 25 mL/min — ABNORMAL LOW (ref >60)
Glucose, Bld: 72 mg/dL (ref 70–99)
Potassium: 3.5 mmol/L (ref 3.5–5.1)
Sodium: 144 mmol/L (ref 135–145)
Total Bilirubin: 0.8 mg/dL (ref 0.3–1.2)
Total Protein: 7.2 g/dL (ref 6.5–8.1)

## 2021-09-13 LAB — URINALYSIS, ROUTINE W REFLEX MICROSCOPIC
Bilirubin Urine: NEGATIVE
Glucose, UA: NEGATIVE mg/dL
Hgb urine dipstick: NEGATIVE
Ketones, ur: NEGATIVE mg/dL
Leukocytes,Ua: NEGATIVE
Nitrite: NEGATIVE
Protein, ur: NEGATIVE mg/dL
Specific Gravity, Urine: 1.008 (ref 1.005–1.030)
pH: 7 (ref 5.0–8.0)

## 2021-09-13 LAB — CBC WITH DIFFERENTIAL/PLATELET
Abs Immature Granulocytes: 0.02 10*3/uL (ref 0.00–0.07)
Basophils Absolute: 0 10*3/uL (ref 0.0–0.1)
Basophils Relative: 0 %
Eosinophils Absolute: 0.2 10*3/uL (ref 0.0–0.5)
Eosinophils Relative: 3 %
HCT: 29.5 % — ABNORMAL LOW (ref 39.0–52.0)
Hemoglobin: 9.8 g/dL — ABNORMAL LOW (ref 13.0–17.0)
Immature Granulocytes: 0 %
Lymphocytes Relative: 42 %
Lymphs Abs: 2.2 10*3/uL (ref 0.7–4.0)
MCH: 29.6 pg (ref 26.0–34.0)
MCHC: 33.2 g/dL (ref 30.0–36.0)
MCV: 89.1 fL (ref 80.0–100.0)
Monocytes Absolute: 0.4 10*3/uL (ref 0.1–1.0)
Monocytes Relative: 8 %
Neutro Abs: 2.5 10*3/uL (ref 1.7–7.7)
Neutrophils Relative %: 47 %
Platelets: 121 10*3/uL — ABNORMAL LOW (ref 150–400)
RBC: 3.31 MIL/uL — ABNORMAL LOW (ref 4.22–5.81)
RDW: 15.3 % (ref 11.5–15.5)
WBC: 5.2 10*3/uL (ref 4.0–10.5)
nRBC: 0 % (ref 0.0–0.2)

## 2021-09-13 LAB — RESP PANEL BY RT-PCR (FLU A&B, COVID) ARPGX2
Influenza A by PCR: NEGATIVE
Influenza B by PCR: NEGATIVE
SARS Coronavirus 2 by RT PCR: NEGATIVE

## 2021-09-13 MED ORDER — SODIUM CHLORIDE 0.9 % IV BOLUS
500.0000 mL | Freq: Once | INTRAVENOUS | Status: AC
Start: 2021-09-13 — End: 2021-09-13
  Administered 2021-09-13: 500 mL via INTRAVENOUS

## 2021-09-13 MED ORDER — QUETIAPINE FUMARATE 50 MG PO TABS
25.0000 mg | ORAL_TABLET | Freq: Every day | ORAL | Status: DC
  Filled 2021-09-13: qty 1

## 2021-09-13 MED ORDER — LACTATED RINGERS IV SOLN: INTRAVENOUS | Status: DC

## 2021-09-13 MED ORDER — HALOPERIDOL LACTATE 5 MG/ML IJ SOLN
1.0000 mg | Freq: Once | INTRAMUSCULAR | Status: AC
  Administered 2021-09-13: 1 mg via INTRAVENOUS
  Filled 2021-09-13: qty 1

## 2021-09-13 NOTE — ED Triage Notes (Signed)
Patient BIB EMS from home c/o unwitnessed fall. Per report pt was found in the bathroom floor. Family report increase AMS x3 days. Pt uncooperative with C Collar. PT hx of Dementia  CBG124 BP 131/87 HR 80 RR 16 O2sat 99% on RA

## 2021-09-13 NOTE — ED Provider Notes (Signed)
Emergency Department Provider Note   I have reviewed the triage vital signs and the nursing notes.   HISTORY  Chief Complaint Altered Mental Status and Fall   HPI Gerald [name] is a 76 y.o. male with past history of dementia, CHF, GERD, and DM presents to the emergency department for evaluation of increased hallucinations and agitation at home.  Symptoms have been increasing over the past week.  Family noted a Champ Keetch history of dementia with some occasional visual hallucinations but symptoms have significantly worsened.  Family note that he had a fall today which was unwitnessed but heard by family.  He came into the bathroom and found him lodged between the toilet and wall.  They note that he was altered even prior to falling.  No fevers or chills.  He has not started any new medications in the last month. Not complaining or burning with urination and family have not noticed frequency.    Past Medical History:  Diagnosis Date   Abnormality of gait 10/05/2014   Anemia    Anxiety    Bilateral edema of lower extremity    LE Dopplers 4/22 - no thrombus or thrombophelbitis, no reflux   Blood transfusion without reported diagnosis    with surgery 2012   Cataract    removed both eyes   CHF (congestive heart failure) ([name])    Chronic kidney disease    Colon polyp    DDD (degenerative disc disease)    Depression    DJD (degenerative joint disease)    DOE (dyspnea on exertion)    Echo 04 /22/2014 - normal EF, mild LVH, grade 1 Diastolic Dysfunction; Lexiscan Myoview 04/28/12 - no ishcemia or infarction, LBBB septal wall motion; EF ~60%   Fatigue    Fatigue    pt states he gets tired very quickly   GERD (gastroesophageal reflux disease)    H/O iron deficiency anemia    with  hemmorrhoids   Hyperlipidemia    Hypertension    Left bundle branch block     chronic, negative Myoview a normal echo as noted above    Osteoarthritis    with bilateral hip surgeries and multiple  complication, with the most recent  surgery in January 2012   Scrotal edema    Type 2 diabetes mellitus without complications ([place]) 02/54/2706   Vitamin D deficiency     Review of Systems  Level 5 caveat: Dementia   ____________________________________________   PHYSICAL EXAM:  VITAL SIGNS: ED Triage Vitals  Enc Vitals Group     BP 09/13/21 1857 (!) 124/58     Pulse Rate 09/13/21 1857 76     Resp 09/13/21 1857 (!) 21     Temp 09/13/21 1857 98 F (36.7 C)     Temp Source 09/13/21 1857 Oral     SpO2 09/13/21 1857 100 %     Weight 09/13/21 1859 155 lb (70.3 kg)     Height 09/13/21 1859 '5\' 6"'$  (1.676 m)   Constitutional: Confused and mildly agitated but family able to re-direct.  Eyes: Conjunctivae are normal.  Head: Atraumatic. Nose: No congestion/rhinnorhea. Mouth/Throat: Mucous membranes are moist.  Neck: No stridor.  Cardiovascular: Normal rate, regular rhythm. Good peripheral circulation. Grossly normal heart sounds.   Respiratory: Normal respiratory effort.  No retractions. Lungs CTAB. Gastrointestinal: Soft and nontender. No distention.  Musculoskeletal: No lower extremity tenderness nor edema. No gross deformities of extremities. Neurologic:  Normal speech and language. No gross focal neurologic deficits are appreciated.  Skin:  Skin is warm, dry and intact. No rash noted.  ____________________________________________   LABS (all labs ordered are listed, but only abnormal results are displayed)  Labs Reviewed  URINE CULTURE - Abnormal; Notable for the following components:      Result Value   Culture   (*)    Value: 30,000 COLONIES/mL DIPHTHEROIDS(CORYNEBACTERIUM SPECIES) Standardized susceptibility testing for this organism is not available. 10,000 COLONIES/mL GROUP B STREP(S.AGALACTIAE)ISOLATED TESTING AGAINST S. AGALACTIAE NOT ROUTINELY PERFORMED DUE TO PREDICTABILITY OF AMP/PEN/VAN SUSCEPTIBILITY. Performed at [facility], [place] [address],  [place], [place] [address]    All other components within normal limits  COMPREHENSIVE METABOLIC PANEL - Abnormal; Notable for the following components:   Chloride 114 (*)    CO2 19 (*)    BUN 36 (*)    Creatinine, Ser 2.61 (*)    GFR, Estimated 25 (*)    All other components within normal limits  CBC WITH DIFFERENTIAL/PLATELET - Abnormal; Notable for the following components:   RBC 3.31 (*)    Hemoglobin 9.8 (*)    HCT 29.5 (*)    Platelets 121 (*)    All other components within normal limits  URINALYSIS, ROUTINE W REFLEX MICROSCOPIC - Abnormal; Notable for the following components:   Color, Urine STRAW (*)    All other components within normal limits  BASIC METABOLIC PANEL - Abnormal; Notable for the following components:   Potassium 3.3 (*)    Chloride 116 (*)    CO2 17 (*)    Glucose, Bld 68 (*)    BUN 36 (*)    Creatinine, Ser 2.39 (*)    GFR, Estimated 28 (*)    All other components within normal limits  CBC - Abnormal; Notable for the following components:   RBC 3.36 (*)    Hemoglobin 9.9 (*)    HCT 30.1 (*)    Platelets 133 (*)    All other components within normal limits  BASIC METABOLIC PANEL - Abnormal; Notable for the following components:   Sodium 146 (*)    Chloride 120 (*)    BUN 29 (*)    Creatinine, Ser 1.82 (*)    Calcium 8.8 (*)    GFR, Estimated 38 (*)    Anion gap 4 (*)    All other components within normal limits  CBC - Abnormal; Notable for the following components:   RBC 2.98 (*)    Hemoglobin 8.7 (*)    HCT 27.6 (*)    RDW 15.9 (*)    Platelets 107 (*)    All other components within normal limits  BASIC METABOLIC PANEL - Abnormal; Notable for the following components:   Potassium 3.3 (*)    Chloride 116 (*)    CO2 21 (*)    BUN 29 (*)    Creatinine, Ser 1.64 (*)    GFR, Estimated 43 (*)    All other components within normal limits  CBC - Abnormal; Notable for the following components:   RBC 3.02 (*)    Hemoglobin 8.8 (*)    HCT 27.5 (*)     Platelets 104 (*)    All other components within normal limits  BASIC METABOLIC PANEL - Abnormal; Notable for the following components:   Chloride 115 (*)    Creatinine, Ser 1.77 (*)    GFR, Estimated 40 (*)    All other components within normal limits  MAGNESIUM - Abnormal; Notable for the following components:   Magnesium 1.6 (*)  All other components within normal limits  RESP PANEL BY RT-PCR (FLU A&B, COVID) ARPGX2  ETHANOL  MAGNESIUM  TSH  AMMONIA  RPR  VITAMIN B12  RAPID URINE DRUG SCREEN, HOSP PERFORMED  MAGNESIUM   ____________________________________________  EKG   EKG Interpretation  Date/Time:  Friday September 13 2021 20:59:10 EDT Ventricular Rate:  84 PR Interval:  168 QRS Duration: 167 QT Interval:  475 QTC Calculation: 562 R Axis:   -10 Text Interpretation: Sinus rhythm Left bundle branch block No sig change from Jul 2015 tracing Confirmed by Octaviano Glow [phone] on 09/14/2021 10:59:09 AM        ____________________________________________   PROCEDURES  Procedure(s) performed:   Procedures  None ____________________________________________   INITIAL IMPRESSION / ASSESSMENT AND PLAN / ED COURSE  Pertinent labs & imaging results that were available during my care of the patient were reviewed by me and considered in my medical decision making (see chart for details).   This patient is Presenting for Evaluation of AMS, which does require a range of treatment options, and is a complaint that involves a high risk of morbidity and mortality.  The Differential Diagnoses includes but is not exclusive to alcohol, illicit or prescription medications, intracranial pathology such as stroke, intracerebral hemorrhage, fever or infectious causes including sepsis, hypoxemia, uremia, trauma, endocrine related disorders such as diabetes, hypoglycemia, thyroid-related diseases, etc.   Critical Interventions-    Medications  sodium chloride 0.9 % bolus  500 mL (0 mLs Intravenous Stopped 09/13/21 2047)  haloperidol lactate (HALDOL) injection 1 mg (1 mg Intravenous Given 09/13/21 2145)  potassium chloride SA (KLOR-CON M) CR tablet 20 mEq (20 mEq Oral Given 09/14/21 0336)  potassium chloride SA (KLOR-CON M) CR tablet 40 mEq (40 mEq Oral Given 09/16/21 1013)  melatonin tablet 3 mg (3 mg Oral Given 09/16/21 2118)  magnesium sulfate IVPB 2 g 50 mL (2 g Intravenous New Bag/Given 09/17/21 0945)    Reassessment after intervention:  no change in mental status.    I did obtain Additional Historical Information from family at bedside.    Clinical Laboratory Tests Ordered, included creatinine elevated but no recent labs for comparison. No outside labs available in [place]. COVID negative. No clear UTI.   Radiologic Tests Ordered, included CT head, c-spine. I independently interpreted the images and agree with radiology interpretation.   Cardiac Monitor Tracing which shows NSR.    Social Determinants of Health Risk patient is a non-smoker.   Consult complete with Hospitalist. Plan to admit with AMS and ? AKI vs CKD.   Medical Decision Making: Summary:  Patient presents to the emergency department with altered mental status.  He did have a fall today but apparently was confused prior.  Has history of dementia with increased hallucination and agitation.   Reevaluation with update and discussion with family at bedside. Plan for admit. They are in agreement.   Disposition: admit  ____________________________________________  FINAL CLINICAL IMPRESSION(S) / ED DIAGNOSES  Final diagnoses:  Delirium  AKI (acute kidney injury) ([place])     NEW OUTPATIENT MEDICATIONS STARTED DURING THIS VISIT:  Discharge Medication List as of 09/17/2021 12:58 PM     START taking these medications   Details  polyethylene glycol (MIRALAX / GLYCOLAX) 17 g packet Take 17 g by mouth daily as needed for moderate constipation., Starting Mon 09/16/2021, Normal     senna-docusate (SENOKOT-S) 8.6-50 MG tablet Take 1 tablet by mouth 2 (two) times daily., Starting Mon 09/16/2021, Normal  Note:  This document was prepared using Dragon voice recognition software and may include unintentional dictation errors.  Nanda Quinton, MD, [facility]    Shawnie Nicole, [facility], MD 09/18/21 [phone]

## 2021-09-13 NOTE — Telephone Encounter (Signed)
Pt wife is calling. Vickii Chafe is calling and wants to talk to a nurse. [name] stated he is doing strange things like walking around with his rob open when kids are around. Stated he is talking about death. [name] said she don't know is if the medication.

## 2021-09-13 NOTE — ED Notes (Signed)
Unable to do EKG at this time. Pt confuse and agitated.

## 2021-09-14 ENCOUNTER — Encounter (HOSPITAL_COMMUNITY): Payer: Self-pay | Admitting: Family Medicine

## 2021-09-14 DIAGNOSIS — I5032 Chronic diastolic (congestive) heart failure: Secondary | ICD-10-CM | POA: Diagnosis present

## 2021-09-14 DIAGNOSIS — G9341 Metabolic encephalopathy: Secondary | ICD-10-CM | POA: Diagnosis present

## 2021-09-14 DIAGNOSIS — I447 Left bundle-branch block, unspecified: Secondary | ICD-10-CM | POA: Diagnosis present

## 2021-09-14 DIAGNOSIS — N179 Acute kidney failure, unspecified: Secondary | ICD-10-CM | POA: Diagnosis present

## 2021-09-14 DIAGNOSIS — E1122 Type 2 diabetes mellitus with diabetic chronic kidney disease: Secondary | ICD-10-CM | POA: Diagnosis present

## 2021-09-14 DIAGNOSIS — E876 Hypokalemia: Secondary | ICD-10-CM | POA: Diagnosis not present

## 2021-09-14 DIAGNOSIS — N401 Enlarged prostate with lower urinary tract symptoms: Secondary | ICD-10-CM | POA: Diagnosis present

## 2021-09-14 DIAGNOSIS — R339 Retention of urine, unspecified: Secondary | ICD-10-CM | POA: Diagnosis present

## 2021-09-14 DIAGNOSIS — Z7189 Other specified counseling: Secondary | ICD-10-CM | POA: Diagnosis not present

## 2021-09-14 DIAGNOSIS — D631 Anemia in chronic kidney disease: Secondary | ICD-10-CM | POA: Diagnosis present

## 2021-09-14 DIAGNOSIS — D696 Thrombocytopenia, unspecified: Secondary | ICD-10-CM | POA: Diagnosis present

## 2021-09-14 DIAGNOSIS — N183 Chronic kidney disease, stage 3 unspecified: Secondary | ICD-10-CM

## 2021-09-14 DIAGNOSIS — I130 Hypertensive heart and chronic kidney disease with heart failure and stage 1 through stage 4 chronic kidney disease, or unspecified chronic kidney disease: Secondary | ICD-10-CM | POA: Diagnosis present

## 2021-09-14 DIAGNOSIS — I10 Essential (primary) hypertension: Secondary | ICD-10-CM

## 2021-09-14 DIAGNOSIS — G309 Alzheimer's disease, unspecified: Secondary | ICD-10-CM | POA: Diagnosis present

## 2021-09-14 DIAGNOSIS — R410 Disorientation, unspecified: Secondary | ICD-10-CM | POA: Diagnosis present

## 2021-09-14 DIAGNOSIS — Z66 Do not resuscitate: Secondary | ICD-10-CM | POA: Diagnosis present

## 2021-09-14 DIAGNOSIS — F0282 Dementia in other diseases classified elsewhere, unspecified severity, with psychotic disturbance: Secondary | ICD-10-CM | POA: Diagnosis present

## 2021-09-14 DIAGNOSIS — Z515 Encounter for palliative care: Secondary | ICD-10-CM | POA: Diagnosis not present

## 2021-09-14 DIAGNOSIS — E785 Hyperlipidemia, unspecified: Secondary | ICD-10-CM | POA: Diagnosis present

## 2021-09-14 DIAGNOSIS — E870 Hyperosmolality and hypernatremia: Secondary | ICD-10-CM | POA: Diagnosis present

## 2021-09-14 DIAGNOSIS — R9431 Abnormal electrocardiogram [ECG] [EKG]: Secondary | ICD-10-CM | POA: Diagnosis present

## 2021-09-14 DIAGNOSIS — Z20822 Contact with and (suspected) exposure to covid-19: Secondary | ICD-10-CM | POA: Diagnosis present

## 2021-09-14 DIAGNOSIS — R338 Other retention of urine: Secondary | ICD-10-CM | POA: Diagnosis not present

## 2021-09-14 DIAGNOSIS — G9340 Encephalopathy, unspecified: Secondary | ICD-10-CM

## 2021-09-14 DIAGNOSIS — G4733 Obstructive sleep apnea (adult) (pediatric): Secondary | ICD-10-CM | POA: Diagnosis present

## 2021-09-14 DIAGNOSIS — F02818 Dementia in other diseases classified elsewhere, unspecified severity, with other behavioral disturbance: Secondary | ICD-10-CM | POA: Diagnosis present

## 2021-09-14 DIAGNOSIS — E113211 Type 2 diabetes mellitus with mild nonproliferative diabetic retinopathy with macular edema, right eye: Secondary | ICD-10-CM | POA: Diagnosis present

## 2021-09-14 DIAGNOSIS — D638 Anemia in other chronic diseases classified elsewhere: Secondary | ICD-10-CM | POA: Diagnosis present

## 2021-09-14 DIAGNOSIS — Z87891 Personal history of nicotine dependence: Secondary | ICD-10-CM | POA: Diagnosis not present

## 2021-09-14 LAB — RAPID URINE DRUG SCREEN, HOSP PERFORMED
Amphetamines: NOT DETECTED
Barbiturates: NOT DETECTED
Benzodiazepines: NOT DETECTED
Cocaine: NOT DETECTED
Opiates: NOT DETECTED
Tetrahydrocannabinol: NOT DETECTED

## 2021-09-14 LAB — AMMONIA: Ammonia: 15 umol/L (ref 9–35)

## 2021-09-14 LAB — BASIC METABOLIC PANEL
Anion gap: 11 (ref 5–15)
BUN: 36 mg/dL — ABNORMAL HIGH (ref 8–23)
CO2: 17 mmol/L — ABNORMAL LOW (ref 22–32)
Calcium: 9.9 mg/dL (ref 8.9–10.3)
Chloride: 116 mmol/L — ABNORMAL HIGH (ref 98–111)
Creatinine, Ser: 2.39 mg/dL — ABNORMAL HIGH (ref 0.61–1.24)
GFR, Estimated: 28 mL/min — ABNORMAL LOW (ref >60)
Glucose, Bld: 68 mg/dL — ABNORMAL LOW (ref 70–99)
Potassium: 3.3 mmol/L — ABNORMAL LOW (ref 3.5–5.1)
Sodium: 144 mmol/L (ref 135–145)

## 2021-09-14 LAB — CBC
HCT: 30.1 % — ABNORMAL LOW (ref 39.0–52.0)
Hemoglobin: 9.9 g/dL — ABNORMAL LOW (ref 13.0–17.0)
MCH: 29.5 pg (ref 26.0–34.0)
MCHC: 32.9 g/dL (ref 30.0–36.0)
MCV: 89.6 fL (ref 80.0–100.0)
Platelets: 133 10*3/uL — ABNORMAL LOW (ref 150–400)
RBC: 3.36 MIL/uL — ABNORMAL LOW (ref 4.22–5.81)
RDW: 15.1 % (ref 11.5–15.5)
WBC: 6.2 10*3/uL (ref 4.0–10.5)
nRBC: 0 % (ref 0.0–0.2)

## 2021-09-14 LAB — TSH: TSH: 4.243 u[IU]/mL (ref 0.350–4.500)

## 2021-09-14 LAB — RPR: RPR Ser Ql: NONREACTIVE

## 2021-09-14 LAB — VITAMIN B12: Vitamin B-12: 327 pg/mL (ref 180–914)

## 2021-09-14 LAB — MAGNESIUM: Magnesium: 1.7 mg/dL (ref 1.7–2.4)

## 2021-09-14 LAB — ETHANOL: Alcohol, Ethyl (B): <10 mg/dL (ref <10)

## 2021-09-14 MED ORDER — HEPARIN SODIUM (PORCINE) 5000 UNIT/ML IJ SOLN
5000.0000 [IU] | Freq: Three times a day (TID) | INTRAMUSCULAR | Status: DC
  Administered 2021-09-14 – 2021-09-17 (×10): 5000 [IU] via SUBCUTANEOUS
  Filled 2021-09-14 (×10): qty 1

## 2021-09-14 MED ORDER — SENNOSIDES-DOCUSATE SODIUM 8.6-50 MG PO TABS
1.0000 | ORAL_TABLET | Freq: Every evening | ORAL | Status: DC | PRN
  Administered 2021-09-16: 1 via ORAL
  Filled 2021-09-14: qty 1

## 2021-09-14 MED ORDER — CHLORHEXIDINE GLUCONATE CLOTH 2 % EX PADS
6.0000 | MEDICATED_PAD | Freq: Every day | CUTANEOUS | Status: DC
  Administered 2021-09-14 – 2021-09-17 (×3): 6 via TOPICAL

## 2021-09-14 MED ORDER — ACETAMINOPHEN 325 MG PO TABS
650.0000 mg | ORAL_TABLET | Freq: Four times a day (QID) | ORAL | Status: DC | PRN
  Administered 2021-09-14 – 2021-09-16 (×2): 650 mg via ORAL
  Filled 2021-09-14 (×2): qty 2

## 2021-09-14 MED ORDER — QUETIAPINE FUMARATE 25 MG PO TABS
12.5000 mg | ORAL_TABLET | Freq: Two times a day (BID) | ORAL | Status: DC
  Administered 2021-09-14 – 2021-09-17 (×8): 12.5 mg via ORAL
  Filled 2021-09-14 (×8): qty 1

## 2021-09-14 MED ORDER — TAMSULOSIN HCL 0.4 MG PO CAPS
0.4000 mg | ORAL_CAPSULE | Freq: Every day | ORAL | Status: DC
  Administered 2021-09-14 – 2021-09-16 (×3): 0.4 mg via ORAL
  Filled 2021-09-14 (×3): qty 1

## 2021-09-14 MED ORDER — ACETAMINOPHEN 650 MG RE SUPP: 650.0000 mg | Freq: Four times a day (QID) | RECTAL | Status: DC | PRN

## 2021-09-14 MED ORDER — SODIUM CHLORIDE 0.9% FLUSH
3.0000 mL | Freq: Two times a day (BID) | INTRAVENOUS | Status: DC
  Administered 2021-09-15 – 2021-09-17 (×4): 3 mL via INTRAVENOUS

## 2021-09-14 MED ORDER — RIVASTIGMINE TARTRATE 3 MG PO CAPS
3.0000 mg | ORAL_CAPSULE | Freq: Two times a day (BID) | ORAL | Status: DC
  Administered 2021-09-14 – 2021-09-17 (×8): 3 mg via ORAL
  Filled 2021-09-14 (×8): qty 1

## 2021-09-14 MED ORDER — SODIUM CHLORIDE 0.9 % IV SOLN: INTRAVENOUS | Status: DC

## 2021-09-14 MED ORDER — AMLODIPINE BESYLATE 10 MG PO TABS
10.0000 mg | ORAL_TABLET | Freq: Every day | ORAL | Status: DC
  Administered 2021-09-14 – 2021-09-17 (×4): 10 mg via ORAL
  Filled 2021-09-14: qty 2
  Filled 2021-09-14 (×3): qty 1

## 2021-09-14 MED ORDER — POTASSIUM CHLORIDE CRYS ER 20 MEQ PO TBCR
20.0000 meq | EXTENDED_RELEASE_TABLET | Freq: Once | ORAL | Status: AC
  Administered 2021-09-14: 20 meq via ORAL
  Filled 2021-09-14: qty 1

## 2021-09-14 NOTE — Progress Notes (Signed)
3PT Cancellation Note  Patient Details Name: Gerald [name] MRN: [id number] DOB: March 02, 1945   Cancelled Treatment:    Reason Eval/Treat Not Completed: (P) Fatigue/lethargy limiting ability to participate: pt sleeping, RN requesting hold PT as pt previously in high levels of pain and unable to rest. Will follow up as schedule and Pt status allows, which may be another day.   Coolidge Breeze, PT, DPT [facility] Department Office: [phone] Coolidge Breeze 09/14/2021, 2:15 PM

## 2021-09-14 NOTE — H&P (Signed)
History and Physical    Gerald [name] XBM:[id number] DOB: [birth year] DOA: 09/13/2021  PCP: [name], DO   Patient coming from: Home   Chief Complaint: Increased confusion   HPI: Gerald [name] is a 76 y.o. male with medical history significant for hypertension, chronic diastolic CHF, chronic kidney disease stage III, and Alzheimer dementia who now presents to the emergency department with increased confusion.  Patient's family reports that patient has been more confused than usual for the past 3 days or so, has had increased visual hallucinations, and then suffered an unwitnessed fall today.  He has history of confusion, hallucinations, and falls, but has been much worse over the past few days.  Patient has no specific complaints.  He acknowledges that he has been confused, stating, "yeah I'm confused, I got Alzheimer's man."  ED Course: Upon arrival to the ED, patient is found to be afebrile with stable blood pressure.  EKG features sinus rhythm with chronic LBBB.  Chest x-ray is negative for acute cardiopulmonary disease.  No acute findings noted on CT of the head or cervical spine.  Blood work notable for creatinine 2.61, hemoglobin 9.8, and platelets 121,000.  Patient was given IV fluids, Haldol, and Seroquel in the ED.  Review of Systems:  All other systems reviewed and apart from HPI, are negative.  Past Medical History:  Diagnosis Date   Abnormality of gait 10/05/2014   Anemia    Anxiety    Bilateral edema of lower extremity    LE Dopplers 4/22 - no thrombus or thrombophelbitis, no reflux   Blood transfusion without reported diagnosis    with surgery 2012   Cataract    removed both eyes   CHF (congestive heart failure) ([name])    Chronic kidney disease    Colon polyp    DDD (degenerative disc disease)    Depression    DJD (degenerative joint disease)    DOE (dyspnea on exertion)    Echo 04 /22/2014 - normal EF, mild LVH, grade 1 Diastolic Dysfunction; Lexiscan  Myoview 04/28/12 - no ishcemia or infarction, LBBB septal wall motion; EF ~60%   Fatigue    Fatigue    pt states he gets tired very quickly   GERD (gastroesophageal reflux disease)    H/O iron deficiency anemia    with  hemmorrhoids   Hyperlipidemia    Hypertension    Left bundle branch block     chronic, negative Myoview a normal echo as noted above    Osteoarthritis    with bilateral hip surgeries and multiple complication, with the most recent  surgery in January 2012   Scrotal edema    Type 2 diabetes mellitus without complications ([place]) 02/72/5366   Vitamin D deficiency     Past Surgical History:  Procedure Laterality Date   Cardiology Nuclear Med Study   04/29/2012   overall impression ; NORMAL  STRESS  NUCLEAR STUDY   COLONOSCOPY  01/28/2010   HIP SURGERY Right 01/06/2002   replacement   HIP SURGERY Left 02/2010   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 His smoking use included  cigarettes. He has a 15.00 pack-year smoking history. He has never used smokeless tobacco. He reports current alcohol use of about 1.0 standard drink of alcohol per week. He reports that he does not use drugs.  No Known Allergies  Family History  Problem Relation Age of Onset   Dementia Mother    Hypertension Mother    Heart disease Mother    Diabetes Mother    Heart attack Maternal Grandmother    Diabetes Maternal Grandfather    Colon cancer Neg Hx    Colon polyps Neg Hx    Esophageal cancer Neg Hx    Rectal cancer Neg Hx    Stomach cancer Neg Hx      Prior to Admission medications   Medication Sig Start Date End Date Taking? Authorizing  Provider  acetaminophen (TYLENOL) 325 MG tablet 1 tablet as needed    [provider]  ALPRAZolam Duanne Moron) 0.25 MG tablet  01/09/16   [provider]  amLODipine (NORVASC) 10 MG tablet 1 tablet    [provider]  atorvastatin (LIPITOR) 10 MG tablet 1 tablet    [provider]  bisoprolol-hydrochlorothiazide (ZIAC) 2.5-6.25 MG tablet  04/16/20   [provider]  buPROPion (WELLBUTRIN XL) 150 MG 24 hr tablet TAKE 1 TABLET (150 MG TOTAL) BY MOUTH EVERY MORNING. 01/16/15   Arfeen, Arlyce Harman, MD  buPROPion (WELLBUTRIN XL) 300 MG 24 hr tablet  06/30/18   [provider]  doxazosin (CARDURA) 8 MG tablet Take 1 tablet (8 mg total) by mouth daily. 03/24/13   Unk Pinto, MD  enalapril (VASOTEC) 20 MG tablet Take 20 mg by mouth 2 (two) times daily.    [provider]  FLUoxetine (PROZAC) 40 MG capsule Take 1 capsule (40 mg total) by mouth daily. 01/16/15   Arfeen, Arlyce Harman, MD  furosemide (LASIX) 20 MG tablet 1 tablet    [provider]  Melatonin 5 MG SUBL Place 5 mg under the tongue at bedtime as needed (sleep).     [provider]  memantine (NAMENDA) 10 MG tablet Take 1 tablet (10 mg total) by mouth 2 (two) times daily. 07/25/21 07/20/22  Alric Ran, MD  Multiple Vitamins-Minerals (CENTRUM SILVER 50+MEN) TABS See admin instructions.    [provider]  omeprazole (PRILOSEC) 40 MG capsule Take 1 capsule (40 mg total) by mouth daily. 01/12/13   Unk Pinto, MD  QUEtiapine (SEROQUEL) 25 MG tablet Take 0.5 tablets (12.5 mg total) by mouth 2 (two) times daily. 08/05/21 12/03/21  Alric Ran, MD  rivastigmine (EXELON) 3 MG capsule Take 1 capsule (3 mg total) by mouth 2 (two) times daily. 07/25/21 07/20/22  Alric Ran, MD    Physical Exam: Vitals:   09/13/21 1945 09/13/21 2100 09/13/21 2350 09/14/21 0145  BP: (!) 122/103 (!) 150/119  (!) 157/80  Pulse: (!) 126 86  80  Resp:  13  16  Temp:   97.6 F (36.4 C)    TempSrc:   Oral   SpO2: (!) 84% 100%  100%  Weight:      Height:        Constitutional: NAD, no diaphoresis or pallor   Eyes: PERTLA, lids and conjunctivae normal ENMT: Mucous membranes are moist. Posterior pharynx clear of any exudate or lesions.   Neck: supple, no masses  Respiratory: no wheezing, no crackles. No accessory muscle use.  Cardiovascular: S1 & S2 heard, regular rate and rhythm. No extremity edema.   Abdomen: No distension, no tenderness, soft.  Bowel sounds active.  Musculoskeletal: no clubbing / cyanosis. No joint deformity upper and lower extremities.   Skin: no significant rashes, lesions, ulcers. Warm, dry, well-perfused. Neurologic: CN 2-12 grossly intact. Moving all extremities. Alert and oriented to person only.  Psychiatric: Calm. Cooperative.    Labs and Imaging on Admission: I have personally reviewed following labs and imaging studies  CBC: Recent Labs  Lab 09/13/21 1958  WBC 5.2  NEUTROABS 2.5  HGB 9.8*  HCT 29.5*  MCV 89.1  PLT 678*   Basic Metabolic Panel: Recent Labs  Lab 09/13/21 1958  NA 144  K 3.5  CL 114*  CO2 19*  GLUCOSE 72  BUN 36*  CREATININE 2.61*  CALCIUM 9.6   GFR: Estimated Creatinine Clearance: 22.1 mL/min (A) (by C-G formula based on SCr of 2.61 mg/dL (H)). Liver Function Tests: Recent Labs  Lab 09/13/21 1958  AST 23  ALT 13  ALKPHOS 68  BILITOT 0.8  PROT 7.2  ALBUMIN 4.0   No results for input(s): "LIPASE", "AMYLASE" in the last 168 hours. No results for input(s): "AMMONIA" in the last 168 hours. Coagulation Profile: No results for input(s): "INR", "PROTIME" in the last 168 hours. Cardiac Enzymes: No results for input(s): "CKTOTAL", "CKMB", "CKMBINDEX", "TROPONINI" in the last 168 hours. BNP (last 3 results) No results for input(s): "PROBNP" in the last 8760 hours. HbA1C: No results for input(s): "HGBA1C" in the last 72 hours. CBG: No results for input(s): "GLUCAP" in the last 168 hours. Lipid  Profile: No results for input(s): "CHOL", "HDL", "LDLCALC", "TRIG", "CHOLHDL", "LDLDIRECT" in the last 72 hours. Thyroid Function Tests: No results for input(s): "TSH", "T4TOTAL", "FREET4", "T3FREE", "THYROIDAB" in the last 72 hours. Anemia Panel: No results for input(s): "VITAMINB12", "FOLATE", "FERRITIN", "TIBC", "IRON", "RETICCTPCT" in the last 72 hours. Urine analysis:    Component Value Date/Time   COLORURINE STRAW (A) 09/13/2021 2121   APPEARANCEUR CLEAR 09/13/2021 2121   LABSPEC 1.008 09/13/2021 2121   PHURINE 7.0 09/13/2021 2121   GLUCOSEU NEGATIVE 09/13/2021 2121   HGBUR NEGATIVE 09/13/2021 2121   BILIRUBINUR NEGATIVE 09/13/2021 2121   KETONESUR NEGATIVE 09/13/2021 2121   PROTEINUR NEGATIVE 09/13/2021 2121   UROBILINOGEN 0.2 07/15/2013 1359   NITRITE NEGATIVE 09/13/2021 2121   LEUKOCYTESUR NEGATIVE 09/13/2021 2121   Sepsis Labs: '@LABRCNTIP'$ (procalcitonin:4,lacticidven:4) ) Recent Results (from the past 240 hour(s))  Resp Panel by RT-PCR (Flu A&B, Covid) Anterior Nasal Swab     Status: None   Collection Time: 09/13/21  9:45 PM   Specimen: Anterior Nasal Swab  Result Value Ref Range Status   SARS Coronavirus 2 by RT PCR NEGATIVE NEGATIVE Final    Comment: (NOTE) SARS-CoV-2 target nucleic acids are NOT DETECTED.  The SARS-CoV-2 RNA is generally detectable in upper respiratory specimens during the acute phase of infection. The lowest concentration of SARS-CoV-2 viral copies this assay can detect is 138 copies/mL. A negative result does not preclude SARS-Cov-2 infection and should not be used as the sole basis for treatment or other patient management decisions. A negative result may occur with  improper specimen collection/handling, submission of specimen other than nasopharyngeal swab, presence of viral mutation(s) within the areas targeted by this assay, and inadequate number of viral copies(<138 copies/mL). A negative result must be combined with clinical  observations, patient history, and epidemiological information. The expected result is Negative.  Fact Sheet for Patients:  EntrepreneurPulse.com.au  Fact Sheet for Healthcare Providers:  IncredibleEmployment.be  This test is no t yet approved or cleared by the [place]  States FDA and  has been authorized for detection and/or diagnosis of SARS-CoV-2 by FDA under an Emergency Use Authorization (EUA). This EUA will remain  in effect (meaning this test can be used) for the duration of the COVID-19 declaration under Section 564(b)(1) of the Act, 21 U.S.C.section 360bbb-3(b)(1), unless the authorization is terminated  or revoked sooner.       Influenza A by PCR NEGATIVE NEGATIVE Final   Influenza B by PCR NEGATIVE NEGATIVE Final    Comment: (NOTE) The Xpert Xpress SARS-CoV-2/FLU/RSV plus assay is intended as an aid in the diagnosis of influenza from Nasopharyngeal swab specimens and should not be used as a sole basis for treatment. Nasal washings and aspirates are unacceptable for Xpert Xpress SARS-CoV-2/FLU/RSV testing.  Fact Sheet for Patients: EntrepreneurPulse.com.au  Fact Sheet for Healthcare Providers: IncredibleEmployment.be  This test is not yet approved or cleared by the [place] FDA and has been authorized for detection and/or diagnosis of SARS-CoV-2 by FDA under an Emergency Use Authorization (EUA). This EUA will remain in effect (meaning this test can be used) for the duration of the COVID-19 declaration under Section 564(b)(1) of the Act, 21 U.S.C. section 360bbb-3(b)(1), unless the authorization is terminated or revoked.  Performed at [facility], [place] [address] [place], [place] [address]      Radiological Exams on Admission: CT Head Wo Contrast  Result Date: 09/13/2021 CLINICAL DATA:  Unwitnessed fall EXAM: CT HEAD WITHOUT CONTRAST CT CERVICAL SPINE WITHOUT CONTRAST  TECHNIQUE: Multidetector CT imaging of the head and cervical spine was performed following the standard protocol without intravenous contrast. Multiplanar CT image reconstructions of the cervical spine were also generated. RADIATION DOSE REDUCTION: This exam was performed according to the departmental dose-optimization program which includes automated exposure control, adjustment of the mA and/or kV according to patient size and/or use of iterative reconstruction technique. COMPARISON:  CT brain 09/26/2014, CT cervical spine 07/15/2013 FINDINGS: CT HEAD FINDINGS Brain: No acute territorial infarction, hemorrhage or intracranial mass. Atrophy. Mild chronic small vessel ischemic changes of the white matter. Mildly prominent ventricles felt secondary to atrophy Vascular: No hyperdense vessels.  Carotid vascular calcification Skull: Normal. Negative for fracture or focal lesion. Sinuses/Orbits: No acute finding. Other: None CT CERVICAL SPINE FINDINGS Alignment: No subluxation.  Facet alignment within normal limits. Skull base and vertebrae: No acute fracture. No primary bone lesion or focal pathologic process. Soft tissues and spinal canal: No prevertebral fluid or swelling. No visible canal hematoma. Disc levels: Multilevel degenerative changes. Moderate disc space narrowing C5-C6. Partially calcified posterior disc osteophyte C4-C5 with moderate central focal disc protrusion and mass effect on thecal sac. Multilevel foraminal narrowing with severe bilateral foraminal narrowing at C5-C6. Upper chest: Negative. Other: None IMPRESSION: 1. No CT evidence for acute intracranial abnormality. Atrophy and mild chronic small vessel ischemic changes of the white matter 2. Multilevel degenerative changes of the cervical spine. No acute osseous abnormality Electronically Signed   By: Donavan Foil M.D.   On: 09/13/2021 23:35   CT Cervical Spine Wo Contrast  Result Date: 09/13/2021 CLINICAL DATA:  Unwitnessed fall EXAM: CT HEAD  WITHOUT CONTRAST CT CERVICAL SPINE WITHOUT CONTRAST TECHNIQUE: Multidetector CT imaging of the head and cervical spine was performed following the standard protocol without intravenous contrast. Multiplanar CT image reconstructions of the cervical spine were also generated. RADIATION DOSE REDUCTION: This exam was performed according to the departmental dose-optimization program which includes automated exposure control, adjustment of the mA and/or kV according to patient size and/or  use of iterative reconstruction technique. COMPARISON:  CT brain 09/26/2014, CT cervical spine 07/15/2013 FINDINGS: CT HEAD FINDINGS Brain: No acute territorial infarction, hemorrhage or intracranial mass. Atrophy. Mild chronic small vessel ischemic changes of the white matter. Mildly prominent ventricles felt secondary to atrophy Vascular: No hyperdense vessels.  Carotid vascular calcification Skull: Normal. Negative for fracture or focal lesion. Sinuses/Orbits: No acute finding. Other: None CT CERVICAL SPINE FINDINGS Alignment: No subluxation.  Facet alignment within normal limits. Skull base and vertebrae: No acute fracture. No primary bone lesion or focal pathologic process. Soft tissues and spinal canal: No prevertebral fluid or swelling. No visible canal hematoma. Disc levels: Multilevel degenerative changes. Moderate disc space narrowing C5-C6. Partially calcified posterior disc osteophyte C4-C5 with moderate central focal disc protrusion and mass effect on thecal sac. Multilevel foraminal narrowing with severe bilateral foraminal narrowing at C5-C6. Upper chest: Negative. Other: None IMPRESSION: 1. No CT evidence for acute intracranial abnormality. Atrophy and mild chronic small vessel ischemic changes of the white matter 2. Multilevel degenerative changes of the cervical spine. No acute osseous abnormality Electronically Signed   By: Donavan Foil M.D.   On: 09/13/2021 23:35   DG Pelvis Portable  Result Date:  09/13/2021 CLINICAL DATA:  Fall. EXAM: PORTABLE PELVIS 1-2 VIEWS COMPARISON:  Pelvic radiograph dated 02/06/2010. 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 Postsurgical changes of resection of the left femoral neck. There is elevation of the left femur in relation to the acetabulum. No acute fracture. The bones are osteopenic. The soft tissues are unremarkable. Vascular calcifications noted. Degenerative changes of the lower lumbar spine. IMPRESSION: 1. No acute fracture. 2. 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 Electronically Signed   By: Anner Crete M.D.   On: 09/13/2021 20:26   DG Chest Portable 1 View  Result Date: 09/13/2021 CLINICAL DATA:  Trauma, fall EXAM: PORTABLE CHEST 1 VIEW COMPARISON:  07/15/2013 FINDINGS: Cardiac size is within normal limits. Lung fields are clear of any infiltrates or pulmonary edema. There is no pleural effusion or pneumothorax. Small bony spurs are seen in both shoulders. IMPRESSION: No active disease. Electronically Signed   By: Elmer Picker M.D.   On: 09/13/2021 20:26    EKG: Independently reviewed. SR, chronic LBBB.   Assessment/Plan   1. Acute encephalopathy  - Presents with increased confusion, hallucinations, and fall at home  - No acute findings on head CT  - Check TSH, ammonia, B12, and RPR, use delirium precautions    2. AKI superimposed on CKD III  - SCr is 2.61 on admission, up from 1.2 in 2016  - He had CKD 3 on problem list but baseline unknown  - He was given IVF in ED  - Hold ACE-i and diuretics for now, renally-dose medications, repeat chem panel   3. Dementia  - Continue Exelon and Seroquel, use delirium precautions   4. Hypertension  - Continue amlodipine, hold ACE-i and diuretics in light of suspected AKI   5. Chronic diastolic CHF  - Appears compensated  - Hold diuretics and ACE-i in light of suspected AKI, monitor volume status     DVT prophylaxis: sq heparin  Code Status: Full   Level of Care: Level of care: Telemetry Family Communication: Wife updated from ED  Disposition Plan:  Patient is from: home  Anticipated d/c is to: TBD Anticipated d/c date is: Possibly as early as 09/15/21  Patient currently: pending AMS workup, PT eval  Consults called: none  Admission status:  Observation     Vianne Bulls, MD [facility]  09/14/2021, 2:49 AM

## 2021-09-14 NOTE — Progress Notes (Signed)
Patient admitted early this morning for increased confusion with acute kidney injury.  Patient seen and examined at bedside.  He is awake but confused.  I have reviewed patient's medical records including this morning's H&P, current vitals, labs, medications myself.  Continue IV fluids.  Repeat a.m. labs.  Consult palliative care for goals of care discussion.

## 2021-09-14 NOTE — ED Notes (Signed)
Patient agitated and confuse. Pt unable to stay in the bed and keep removing lines and leads. MD informed. Posey belt and mittens applied. Will continue to monitor.

## 2021-09-14 NOTE — ED Notes (Signed)
Patient was given his breakfast tray.

## 2021-09-14 NOTE — Plan of Care (Signed)
Pt has arrived to Room 1514. Alert.

## 2021-09-14 NOTE — ED Notes (Signed)
Patient Pulled IV out.

## 2021-09-14 NOTE — ED Notes (Signed)
Pt has pulled his IV out stated he didn't want it in him. Pt is agitated at this time.

## 2021-09-14 NOTE — ED Notes (Signed)
Pt keep pulling his monitoring equipment off.

## 2021-09-15 ENCOUNTER — Inpatient Hospital Stay (HOSPITAL_COMMUNITY): Payer: Medicare Other

## 2021-09-15 DIAGNOSIS — N183 Chronic kidney disease, stage 3 unspecified: Secondary | ICD-10-CM | POA: Diagnosis not present

## 2021-09-15 DIAGNOSIS — Z7189 Other specified counseling: Secondary | ICD-10-CM

## 2021-09-15 DIAGNOSIS — I5032 Chronic diastolic (congestive) heart failure: Secondary | ICD-10-CM | POA: Diagnosis not present

## 2021-09-15 DIAGNOSIS — Z515 Encounter for palliative care: Secondary | ICD-10-CM | POA: Diagnosis not present

## 2021-09-15 DIAGNOSIS — G309 Alzheimer's disease, unspecified: Secondary | ICD-10-CM | POA: Diagnosis not present

## 2021-09-15 DIAGNOSIS — G9340 Encephalopathy, unspecified: Secondary | ICD-10-CM | POA: Diagnosis not present

## 2021-09-15 DIAGNOSIS — R531 Weakness: Secondary | ICD-10-CM

## 2021-09-15 LAB — URINE CULTURE: Culture: 30000 — AB

## 2021-09-15 LAB — BASIC METABOLIC PANEL
Anion gap: 4 — ABNORMAL LOW (ref 5–15)
BUN: 29 mg/dL — ABNORMAL HIGH (ref 8–23)
CO2: 22 mmol/L (ref 22–32)
Calcium: 8.8 mg/dL — ABNORMAL LOW (ref 8.9–10.3)
Chloride: 120 mmol/L — ABNORMAL HIGH (ref 98–111)
Creatinine, Ser: 1.82 mg/dL — ABNORMAL HIGH (ref 0.61–1.24)
GFR, Estimated: 38 mL/min — ABNORMAL LOW (ref >60)
Glucose, Bld: 73 mg/dL (ref 70–99)
Potassium: 3.7 mmol/L (ref 3.5–5.1)
Sodium: 146 mmol/L — ABNORMAL HIGH (ref 135–145)

## 2021-09-15 LAB — CBC
HCT: 27.6 % — ABNORMAL LOW (ref 39.0–52.0)
Hemoglobin: 8.7 g/dL — ABNORMAL LOW (ref 13.0–17.0)
MCH: 29.2 pg (ref 26.0–34.0)
MCHC: 31.5 g/dL (ref 30.0–36.0)
MCV: 92.6 fL (ref 80.0–100.0)
Platelets: 107 10*3/uL — ABNORMAL LOW (ref 150–400)
RBC: 2.98 MIL/uL — ABNORMAL LOW (ref 4.22–5.81)
RDW: 15.9 % — ABNORMAL HIGH (ref 11.5–15.5)
WBC: 5.5 10*3/uL (ref 4.0–10.5)
nRBC: 0 % (ref 0.0–0.2)

## 2021-09-15 NOTE — Consult Note (Signed)
Consultation Note Date: 09/15/2021   Patient Name: Gerald [name]  DOB: [birth year]  MRN: [id number]  Age / Sex: 76 y.o., male  PCP: [name], DO Referring Physician: Aline August, MD  Reason for Consultation: Establishing goals of care  HPI/Patient Profile: 76 y.o. male    admitted on 09/13/2021    Clinical Assessment and Goals of Care: 76 year old gentleman who lives at home with his wife, history of hypertension chronic diastolic CHF chronic kidney disease stage III and Alzheimer's dementia.  Patient sees neurology in the outpatient setting.  Patient admitted for increased confusion and also had a fall.  Patient was noted to have confusion and hallucinations falls and agitation over the course of the past 1 to 2 days prior to his family bringing him to the emergency room.  Patient admitted to hospital medicine service, required Foley catheter for acute urinary retention, being worked up for acute kidney injury.  Remains on fall precautions.  Physical therapy evaluation is being done.  Palliative medicine consultation for further goals of care discussions has been requested. Patient is awake alert resting in bed.  He states that he is here for urine problems and that they have been resolved since placement of Foley catheter.  He states that he lives at home with his wife, has a son who lives in the [place] area and has a daughter who lives locally.  He states that he has a granddaughter who lives with them. Call placed and I was able to reach patient's wife Granite Godman at [phone].  Introduced myself and palliative care as follows: Palliative medicine is specialized medical care for people living with serious illness. It focuses on providing relief from the symptoms and stress of a serious illness. The goal is to improve quality of life for both the patient and the family. Goals of care: Broad aims of  medical therapy in relation to the patient's values and preferences. Our aim is to provide medical care aimed at enabling patients to achieve the goals that matter most to them, given the circumstances of their particular medical situation and their constraints.    HCPOA Advance care planning documents noted on the chart, living will paperwork is scanned and available in the advance care planning section, patient has designated his wife Latrel Szymczak and then his daughter Tamala Bari to be his healthcare power of attorney agent.  SUMMARY OF RECOMMENDATIONS   Goals of care discussions:  Call placed and was able to reach patient's wife Keyaan Lederman.  Another call placed and also rediscussed with patient's daughter as well.  Patient has a living will that it was reviewed and discussed with wife and daughter separately.  CODE STATUS has been changed to DO NOT RESUSCITATE after discussing with wife and daughter.  Patient would not want artificial aggressive interventions at end-of-life. Continue current mode of care Patient's wife is having surgery and will be arriving at [place] [facility] on 09-16-2021.  She states that the patient has been having more behavior problems at home and has  been having episodes of confusion agitation and falls.   Will consult TOC.  Patient's wife and daughter are asking for assistance from social work with regards to his disposition options.  Code Status/Advance Care Planning: DNR   Symptom Management:     Palliative Prophylaxis:  Delirium Protocol  Psycho-social/Spiritual:  Desire for further Chaplaincy support:yes Additional Recommendations: Caregiving  Support/Resources  Prognosis:  Unable to determine  Discharge Planning: to be determined.       Primary Diagnoses: Present on Admission:  Acute encephalopathy  Stage 3 chronic kidney disease ([name])  Obstructive sleep apnea syndrome  Alzheimer's dementia with behavioral disturbance ([name])  Hypertension   Chronic diastolic heart failure ([name])  Prolonged QT interval  Acute metabolic encephalopathy   I have reviewed the medical record, interviewed the patient and family, and examined the patient. The following aspects are pertinent.  Past Medical History:  Diagnosis Date   Abnormality of gait 10/05/2014   Anemia    Anxiety    Bilateral edema of lower extremity    LE Dopplers 4/22 - no thrombus or thrombophelbitis, no reflux   Blood transfusion without reported diagnosis    with surgery 2012   Cataract    removed both eyes   CHF (congestive heart failure) ([name])    Chronic kidney disease    Colon polyp    DDD (degenerative disc disease)    Depression    DJD (degenerative joint disease)    DOE (dyspnea on exertion)    Echo 04 /22/2014 - normal EF, mild LVH, grade 1 Diastolic Dysfunction; [place] 04/28/12 - no ishcemia or infarction, LBBB septal wall motion; EF ~60%   Fatigue    Fatigue    pt states he gets tired very quickly   GERD (gastroesophageal reflux disease)    H/O iron deficiency anemia    with  hemmorrhoids   Hyperlipidemia    Hypertension    Left bundle branch block     chronic, negative Myoview a normal echo as noted above    Osteoarthritis    with bilateral hip surgeries and multiple complication, with the most recent  surgery in January 2012   Scrotal edema    Type 2 diabetes mellitus without complications ([place]) 62/83/6629   Vitamin D deficiency    Social History   Socioeconomic History   Marital status: Married    Spouse name: Clinical cytogeneticist    Number of children: 2   Years of education: college   Highest education level: Not on file  Occupational History   Occupation: Retired    Fish farm manager: [employer]  Tobacco Use   Smoking status: Former    Packs/day: 0.50    Years: 30.00    Total pack years: 15.00    Types: Cigarettes    Quit date: 03/29/1973    Years since quitting: 48.4   Smokeless tobacco: Never  Substance and Sexual Activity    Alcohol use: Yes    Alcohol/week: 1.0 standard drink of alcohol    Types: 1 Standard drinks or equivalent per week    Comment: couple times a year   Drug use: No   Sexual activity: Not Currently  Other Topics Concern   Not on file  Social History Narrative   Drinks caffeine occasionally.   Patient is right handed.    He is a married father of 2,grand father of 51. He now walks around using cructhes,though he really does not get a lot of excerise.He quit smoking 3/ 1/2 years ago and  drinks social alcohol. 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   Social Determinants of Health   Financial Resource Strain: Not on file  Food Insecurity: Unknown (09/14/2021)   Hunger Vital Sign    Worried About Running Out of Food in the Last Year: Patient refused    [place] in the Last Year: Patient refused  Transportation Needs: No Transportation Needs (09/14/2021)   PRAPARE - Hydrologist (Medical): No    Lack of Transportation (Non-Medical): No  Physical Activity: Not on file  Stress: Not on file  Social Connections: Not on file   Family History  Problem Relation Age of Onset   Dementia Mother    Hypertension Mother    Heart disease Mother    Diabetes Mother    Heart attack Maternal Grandmother    Diabetes Maternal Grandfather    Colon cancer Neg Hx    Colon polyps Neg Hx    Esophageal cancer Neg Hx    Rectal cancer Neg Hx    Stomach cancer Neg Hx    Scheduled Meds:  amLODipine  10 mg Oral Daily   Chlorhexidine Gluconate Cloth  6 each Topical Daily   heparin  5,000 Units Subcutaneous Q8H   QUEtiapine  12.5 mg Oral BID   rivastigmine  3 mg Oral BID   sodium chloride flush  3 mL Intravenous Q12H   tamsulosin  0.4 mg Oral QPC supper   Continuous Infusions: PRN Meds:.acetaminophen **OR** acetaminophen, senna-docusate Medications Prior to Admission:  Prior to Admission medications    Medication Sig Start Date End Date Taking? Authorizing Provider  amLODipine (NORVASC) 10 MG tablet Take 10 mg by mouth in the morning.   Yes [provider]  atorvastatin (LIPITOR) 10 MG tablet Take 10 mg by mouth at bedtime.   Yes [provider]  bisoprolol-hydrochlorothiazide (ZIAC) 2.5-6.25 MG tablet Take 1 tablet by mouth in the morning. 04/16/20  Yes [provider]  buPROPion (WELLBUTRIN XL) 150 MG 24 hr tablet TAKE 1 TABLET (150 MG TOTAL) BY MOUTH EVERY MORNING. Patient taking differently: Take 450 mg by mouth in the morning. 01/16/15  Yes Arfeen, Arlyce Harman, MD  doxazosin (CARDURA) 8 MG tablet Take 1 tablet (8 mg total) by mouth daily. Patient taking differently: Take 8 mg by mouth at bedtime. 03/24/13  Yes Unk Pinto, MD  ferrous sulfate 325 (65 FE) MG tablet Take 325 mg by mouth daily with breakfast.   Yes [provider]  FLUoxetine (PROZAC) 40 MG capsule Take 1 capsule (40 mg total) by mouth daily. Patient taking differently: Take 40 mg by mouth in the morning. 01/16/15  Yes Arfeen, Arlyce Harman, MD  furosemide (LASIX) 20 MG tablet Take 20 mg by mouth in the morning.   Yes [provider]  Melatonin 5 MG SUBL Place 5 mg under the tongue at bedtime as needed (sleep).    Yes [provider]  memantine (NAMENDA) 10 MG tablet Take 1 tablet (10 mg total) by mouth 2 (two) times daily. 07/25/21 07/20/22 Yes Alric Ran, MD  Multiple Vitamins-Minerals (CENTRUM SILVER 50+MEN) TABS Take 1 tablet by mouth daily with breakfast.   Yes [provider]  omeprazole (PRILOSEC) 40 MG capsule Take 1 capsule (40 mg total) by mouth daily. Patient taking differently: Take 40 mg by mouth See admin instructions. Take 40 mg by mouth mid-morning every day 01/12/13  Yes Unk Pinto, MD  QUEtiapine (SEROQUEL) 25 MG tablet Take 0.5 tablets (12.5 mg total) by mouth 2 (two) times daily. 08/05/21 12/03/21 Yes Camara, Maryan Puls, MD  rivastigmine (EXELON) 3 MG  capsule Take 1 capsule (3 mg total) by mouth 2 (two) times daily. 07/25/21 07/20/22 Yes Alric Ran, MD   No Known Allergies Review of Systems Denies pain states that he is feeling well Physical Exam Awake alert Resting in bed Regular work of breathing Has Foley catheter Moves all extremities Alert, oriented answers a few questions appropriately  Vital Signs: BP (!) 152/88 (BP Location: Left Arm)   Pulse 78   Temp 98 F (36.7 C) (Oral)   Resp 18   Ht '5\' 6"'$  (1.676 m)   Wt 70.3 kg   SpO2 97%   BMI 25.02 kg/m  Pain Scale: 0-10   Pain Score: Asleep   SpO2: SpO2: 97 % O2 Device:SpO2: 97 % O2 Flow Rate: .   IO: Intake/output summary:  Intake/Output Summary (Last 24 hours) at 09/15/2021 1345 Last data filed at 09/15/2021 0724 Gross per 24 hour  Intake 2111.67 ml  Output 1725 ml  Net 386.67 ml    LBM:   Baseline Weight: Weight: 70.3 kg Most recent weight: Weight: 70.3 kg     Palliative Assessment/Data:   PPS 60%  Time In:  12.30 Time Out:  1330 Time Total:  60  Greater than 50%  of this time was spent counseling and coordinating care related to the above assessment and plan.  Signed by: Loistine Chance, MD   Please contact Palliative Medicine Team phone at [phone] for questions and concerns.  For individual provider: See Shea Evans

## 2021-09-15 NOTE — Plan of Care (Signed)

## 2021-09-15 NOTE — Progress Notes (Signed)
PROGRESS NOTE    Gerald [name]  GBT:[id number] DOB: January 28, 1945 DOA: 09/13/2021 PCP: [name], DO   Brief Narrative:   76 y.o. male with medical history significant for hypertension, chronic diastolic CHF, chronic kidney disease stage III, and Alzheimer dementia presented with increased confusion.  On presentation, chest x-ray showed no acute cardiopulmonary disease; no acute findings noted on CT of the head or cervical spine.  Creatinine was 2.61.  He was started on IV fluids.  Assessment & Plan:   Acute kidney injury on possible CKD stage III -No recent baseline creatinine; last creatinine in the system was 1.2 in 2016.  Creatinine 2.61 on presentation.  Treated with IV fluids.  Creatinine 1.82 today. -Repeat a.m. labs -Check renal ultrasound  Acute urinary retention -Possibly from BPH.  Possibly leading to acute kidney injury. -Currently has a coud catheter.  Will need outpatient follow-up with urology.  Started on Flomax.  Acute metabolic encephalopathy Dementia -Presented with worsening confusion.  Mental status improving.  Monitor.  Fall precautions.  PT eval. -Ammonia, vitamin B12 and TSH normal.  Start empiric folic acid supplementation -Continue Exelon and Seroquel.  Delirium precautions.  Hyponatremia -Mild.  Monitor  Anemia of chronic disease -Possibly from chronic kidney disease.  Monitor.  Thrombocytopenia -No signs of bleeding.  Monitor.  Chronic diastolic CHF -Compensated.  Strict input and output.  Daily weights.  Diuretics and ACE inhibitor held.  Hypertension -Blood pressure currently stable.  Continue amlodipine    DVT prophylaxis: Heparin subcutaneous Code Status: Full Family Communication: None at bedside Disposition Plan: Status is: Inpatient Remains inpatient appropriate because: Of severity of illness   Consultants: None  Procedures: None  Antimicrobials: None   Subjective: Patient seen and examined at bedside.  No fever,  vomiting, worsening shortness of breath reported.  Feels better after catheter placement yesterday.  Objective: Vitals:   09/14/21 1533 09/14/21 1941 09/14/21 2349 09/15/21 0427  BP: (!) 156/79 105/66 139/75 (!) 152/88  Pulse: 81 76 71 78  Resp: '18 18 15 18  '$ Temp: 97.9 F (36.6 C) 98.2 F (36.8 C) 98.7 F (37.1 C) 98 F (36.7 C)  TempSrc: Oral Oral Oral Oral  SpO2: 100% 100% 100% 97%  Weight:      Height:        Intake/Output Summary (Last 24 hours) at 09/15/2021 0959 Last data filed at 09/15/2021 0724 Gross per 24 hour  Intake 2111.67 ml  Output 2725 ml  Net -613.33 ml   Filed Weights   09/13/21 1859  Weight: 70.3 kg    Examination:  General exam: Appears calm and comfortable.  Elderly male lying in bed.  Currently on room air. Respiratory system: Bilateral decreased breath sounds at bases Cardiovascular system: S1 & S2 heard, Rate controlled Gastrointestinal system: Abdomen is nondistended, soft and nontender. Normal bowel sounds heard. Extremities: No cyanosis, clubbing, edema  Central nervous system: Awake, answers some questions, slow to respond.  Poor historian.  No focal neurological deficits. Moving extremities Skin: No rashes, lesions or ulcers Psychiatry: Flat affect.  No signs of agitation.  Genitourinary: Catheter present    Data Reviewed: I have personally reviewed following labs and imaging studies  CBC: Recent Labs  Lab 09/13/21 1958 09/14/21 0256 09/15/21 0548  WBC 5.2 6.2 5.5  NEUTROABS 2.5  --   --   HGB 9.8* 9.9* 8.7*  HCT 29.5* 30.1* 27.6*  MCV 89.1 89.6 92.6  PLT 121* 133* 710*   Basic Metabolic Panel: Recent Labs  Lab 09/13/21 1958  09/14/21 0256 09/15/21 0548  NA 144 144 146*  K 3.5 3.3* 3.7  CL 114* 116* 120*  CO2 19* 17* 22  GLUCOSE 72 68* 73  BUN 36* 36* 29*  CREATININE 2.61* 2.39* 1.82*  CALCIUM 9.6 9.9 8.8*  MG  --  1.7  --    GFR: Estimated Creatinine Clearance: 31.6 mL/min (A) (by C-G formula based on SCr of 1.82  mg/dL (H)). Liver Function Tests: Recent Labs  Lab 09/13/21 1958  AST 23  ALT 13  ALKPHOS 68  BILITOT 0.8  PROT 7.2  ALBUMIN 4.0   No results for input(s): "LIPASE", "AMYLASE" in the last 168 hours. Recent Labs  Lab 09/14/21 0249  AMMONIA 15   Coagulation Profile: No results for input(s): "INR", "PROTIME" in the last 168 hours. Cardiac Enzymes: No results for input(s): "CKTOTAL", "CKMB", "CKMBINDEX", "TROPONINI" in the last 168 hours. BNP (last 3 results) No results for input(s): "PROBNP" in the last 8760 hours. HbA1C: No results for input(s): "HGBA1C" in the last 72 hours. CBG: No results for input(s): "GLUCAP" in the last 168 hours. Lipid Profile: No results for input(s): "CHOL", "HDL", "LDLCALC", "TRIG", "CHOLHDL", "LDLDIRECT" in the last 72 hours. Thyroid Function Tests: Recent Labs    09/14/21 0249  TSH 4.243   Anemia Panel: Recent Labs    09/14/21 0249  VITAMINB12 327   Sepsis Labs: No results for input(s): "PROCALCITON", "LATICACIDVEN" in the last 168 hours.  Recent Results (from the past 240 hour(s))  Urine Culture     Status: None (Preliminary result)   Collection Time: 09/13/21  9:21 PM   Specimen: Urine, Clean Catch  Result Value Ref Range Status   Specimen Description   Final    URINE, CLEAN CATCH Performed at [facility], [place] [address], [place], [place] [address]    Special Requests   Final    NONE Performed at [facility], [place] [address], [place], [place] [address]    Culture   Final    CULTURE REINCUBATED FOR BETTER GROWTH Performed at [facility], [place] [address] [place], [place] [address]    Report Status PENDING  Incomplete  Resp Panel by RT-PCR (Flu A&B, Covid) Anterior Nasal Swab     Status: None   Collection Time: 09/13/21  9:45 PM   Specimen: Anterior Nasal Swab  Result Value Ref Range Status   SARS Coronavirus 2 by RT PCR NEGATIVE NEGATIVE Final    Comment: (NOTE) SARS-CoV-2  target nucleic acids are NOT DETECTED.  The SARS-CoV-2 RNA is generally detectable in upper respiratory specimens during the acute phase of infection. The lowest concentration of SARS-CoV-2 viral copies this assay can detect is 138 copies/mL. A negative result does not preclude SARS-Cov-2 infection and should not be used as the sole basis for treatment or other patient management decisions. A negative result may occur with  improper specimen collection/handling, submission of specimen other than nasopharyngeal swab, presence of viral mutation(s) within the areas targeted by this assay, and inadequate number of viral copies(<138 copies/mL). A negative result must be combined with clinical observations, patient history, and epidemiological information. The expected result is Negative.  Fact Sheet for Patients:  EntrepreneurPulse.com.au  Fact Sheet for Healthcare Providers:  IncredibleEmployment.be  This test is no t yet approved or cleared by the [place] FDA and  has been authorized for detection and/or diagnosis of SARS-CoV-2 by FDA under an Emergency Use Authorization (EUA). This EUA will remain  in effect (meaning  this test can be used) for the duration of the COVID-19 declaration under Section 564(b)(1) of the Act, 21 U.S.C.section 360bbb-3(b)(1), unless the authorization is terminated  or revoked sooner.       Influenza A by PCR NEGATIVE NEGATIVE Final   Influenza B by PCR NEGATIVE NEGATIVE Final    Comment: (NOTE) The Xpert Xpress SARS-CoV-2/FLU/RSV plus assay is intended as an aid in the diagnosis of influenza from Nasopharyngeal swab specimens and should not be used as a sole basis for treatment. Nasal washings and aspirates are unacceptable for Xpert Xpress SARS-CoV-2/FLU/RSV testing.  Fact Sheet for Patients: EntrepreneurPulse.com.au  Fact Sheet for Healthcare  Providers: IncredibleEmployment.be  This test is not yet approved or cleared by the [place] FDA and has been authorized for detection and/or diagnosis of SARS-CoV-2 by FDA under an Emergency Use Authorization (EUA). This EUA will remain in effect (meaning this test can be used) for the duration of the COVID-19 declaration under Section 564(b)(1) of the Act, 21 U.S.C. section 360bbb-3(b)(1), unless the authorization is terminated or revoked.  Performed at [facility], [place] [address], [place], [place] [address]          Radiology Studies: CT Head Wo Contrast  Result Date: 09/13/2021 CLINICAL DATA:  Unwitnessed fall EXAM: CT HEAD WITHOUT CONTRAST CT CERVICAL SPINE WITHOUT CONTRAST TECHNIQUE: Multidetector CT imaging of the head and cervical spine was performed following the standard protocol without intravenous contrast. Multiplanar CT image reconstructions of the cervical spine were also generated. RADIATION DOSE REDUCTION: This exam was performed according to the departmental dose-optimization program which includes automated exposure control, adjustment of the mA and/or kV according to patient size and/or use of iterative reconstruction technique. COMPARISON:  CT brain 09/26/2014, CT cervical spine 07/15/2013 FINDINGS: CT HEAD FINDINGS Brain: No acute territorial infarction, hemorrhage or intracranial mass. Atrophy. Mild chronic small vessel ischemic changes of the white matter. Mildly prominent ventricles felt secondary to atrophy Vascular: No hyperdense vessels.  Carotid vascular calcification Skull: Normal. Negative for fracture or focal lesion. Sinuses/Orbits: No acute finding. Other: None CT CERVICAL SPINE FINDINGS Alignment: No subluxation.  Facet alignment within normal limits. Skull base and vertebrae: No acute fracture. No primary bone lesion or focal pathologic process. Soft tissues and spinal canal: No prevertebral fluid or swelling. No  visible canal hematoma. Disc levels: Multilevel degenerative changes. Moderate disc space narrowing C5-C6. Partially calcified posterior disc osteophyte C4-C5 with moderate central focal disc protrusion and mass effect on thecal sac. Multilevel foraminal narrowing with severe bilateral foraminal narrowing at C5-C6. Upper chest: Negative. Other: None IMPRESSION: 1. No CT evidence for acute intracranial abnormality. Atrophy and mild chronic small vessel ischemic changes of the white matter 2. Multilevel degenerative changes of the cervical spine. No acute osseous abnormality Electronically Signed   By: Donavan Foil M.D.   On: 09/13/2021 23:35   CT Cervical Spine Wo Contrast  Result Date: 09/13/2021 CLINICAL DATA:  Unwitnessed fall EXAM: CT HEAD WITHOUT CONTRAST CT CERVICAL SPINE WITHOUT CONTRAST TECHNIQUE: Multidetector CT imaging of the head and cervical spine was performed following the standard protocol without intravenous contrast. Multiplanar CT image reconstructions of the cervical spine were also generated. RADIATION DOSE REDUCTION: This exam was performed according to the departmental dose-optimization program which includes automated exposure control, adjustment of the mA and/or kV according to patient size and/or use of iterative reconstruction technique. COMPARISON:  CT brain 09/26/2014, CT cervical spine 07/15/2013 FINDINGS: CT HEAD FINDINGS Brain: No acute territorial infarction, hemorrhage or intracranial mass.  Atrophy. Mild chronic small vessel ischemic changes of the white matter. Mildly prominent ventricles felt secondary to atrophy Vascular: No hyperdense vessels.  Carotid vascular calcification Skull: Normal. Negative for fracture or focal lesion. Sinuses/Orbits: No acute finding. Other: None CT CERVICAL SPINE FINDINGS Alignment: No subluxation.  Facet alignment within normal limits. Skull base and vertebrae: No acute fracture. No primary bone lesion or focal pathologic process. Soft tissues and  spinal canal: No prevertebral fluid or swelling. No visible canal hematoma. Disc levels: Multilevel degenerative changes. Moderate disc space narrowing C5-C6. Partially calcified posterior disc osteophyte C4-C5 with moderate central focal disc protrusion and mass effect on thecal sac. Multilevel foraminal narrowing with severe bilateral foraminal narrowing at C5-C6. Upper chest: Negative. Other: None IMPRESSION: 1. No CT evidence for acute intracranial abnormality. Atrophy and mild chronic small vessel ischemic changes of the white matter 2. Multilevel degenerative changes of the cervical spine. No acute osseous abnormality Electronically Signed   By: Donavan Foil M.D.   On: 09/13/2021 23:35   DG Pelvis Portable  Result Date: 09/13/2021 CLINICAL DATA:  Fall. EXAM: PORTABLE PELVIS 1-2 VIEWS COMPARISON:  Pelvic radiograph dated 02/06/2010. 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 Postsurgical changes of resection of the left femoral neck. There is elevation of the left femur in relation to the acetabulum. No acute fracture. The bones are osteopenic. The soft tissues are unremarkable. Vascular calcifications noted. Degenerative changes of the lower lumbar spine. IMPRESSION: 1. No acute fracture. 2. 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 Electronically Signed   By: Anner Crete M.D.   On: 09/13/2021 20:26   DG Chest Portable 1 View  Result Date: 09/13/2021 CLINICAL DATA:  Trauma, fall EXAM: PORTABLE CHEST 1 VIEW COMPARISON:  07/15/2013 FINDINGS: Cardiac size is within normal limits. Lung fields are clear of any infiltrates or pulmonary edema. There is no pleural effusion or pneumothorax. Small bony spurs are seen in both shoulders. IMPRESSION: No active disease. Electronically Signed   By: Elmer Picker M.D.   On: 09/13/2021 20:26        Scheduled Meds:  amLODipine  10 mg Oral Daily   Chlorhexidine Gluconate Cloth  6 each Topical Daily   heparin  5,000  Units Subcutaneous Q8H   QUEtiapine  12.5 mg Oral BID   rivastigmine  3 mg Oral BID   sodium chloride flush  3 mL Intravenous Q12H   tamsulosin  0.4 mg Oral QPC supper   Continuous Infusions:        Aline August, MD [facility] 09/15/2021, 9:59 AM

## 2021-09-15 NOTE — Evaluation (Signed)
Physical Therapy Evaluation Patient Details Name: Gerald [name] MRN: [id number] DOB: 08-15-1945 Today's Date: 09/15/2021  History of Present Illness  Pt is a 76yo male presenting [facility] ED on 9/8 after an unwitnessed fall. Imaging negative for acute findings but pt note to be retaining urine and now with catheter in place. PMH: anemia, CHF, CKD, GERD, HLD, HTN, LBBB, OA, DM, R-THA, L-THA, L-RCR, dementia.  Clinical Impression  Pt admitted as above and presenting with functional mobility limitations 2* generalized weakness, ambulatory balance deficits and decreased endurance.  Pt should progress to dc home with family assist.     Recommendations for follow up therapy are one component of a multi-disciplinary discharge planning process, led by the attending physician.  Recommendations may be updated based on patient status, additional functional criteria and insurance authorization.  Follow Up Recommendations Home health PT      Assistance Recommended at Discharge Intermittent Supervision/Assistance  Patient can return home with the following  A little help with walking and/or transfers;A little help with bathing/dressing/bathroom;Assistance with cooking/housework;Assist for transportation;Help with stairs or ramp for entrance    Equipment Recommendations None recommended by PT  Recommendations for Other Services       Functional Status Assessment Patient has had a recent decline in their functional status and demonstrates the ability to make significant improvements in function in a reasonable and predictable amount of time.     Precautions / Restrictions Precautions Precautions: Fall Restrictions Weight Bearing Restrictions: No      Mobility  Bed Mobility Overal bed mobility: Needs Assistance Bed Mobility: Supine to Sit     Supine to sit: Min guard     General bed mobility comments: for safety only    Transfers Overall transfer level: Needs assistance Equipment  used: Rolling walker (2 wheels) Transfers: Sit to/from Stand Sit to Stand: Min assist           General transfer comment: Steady assist only    Ambulation/Gait Ambulation/Gait assistance: Min assist, Min guard Gait Distance (Feet): 135 Feet Assistive device: Rolling walker (2 wheels) Gait Pattern/deviations: Step-to pattern, Decreased step length - right, Decreased step length - left, Shuffle, Trunk flexed, Antalgic       General Gait Details: cues for posture and position from RW; noted antalgic gait but pt with leg length descrepancy and adapted shoes not at hospital  Stairs            Wheelchair Mobility    Modified Rankin (Stroke Patients Only)       Balance Overall balance assessment: Needs assistance Sitting-balance support: No upper extremity supported, Feet supported Sitting balance-Leahy Scale: Good     Standing balance support: No upper extremity supported Standing balance-Leahy Scale: Fair                               Pertinent Vitals/Pain Pain Assessment Pain Assessment: No/denies pain    Home Living Family/patient expects to be discharged to:: Private residence Living Arrangements: Spouse/significant other;Children Available Help at Discharge: Family;Available 24 hours/day Type of Home: House Home Access: Ramped entrance;Stairs to enter   Entrance Stairs-Number of Steps: 3 step with rail vs ramp   Home Layout: One level Home Equipment: Rolling Walker (2 wheels) Additional Comments: Wife is having back surgery 09/16/21    Prior Function Prior Level of Function : Independent/Modified Independent             Mobility Comments: Pt reports he has  been using RW recently       Hand Dominance        Extremity/Trunk Assessment   Upper Extremity Assessment Upper Extremity Assessment: Generalized weakness    Lower Extremity Assessment Lower Extremity Assessment: Generalized weakness       Communication    Communication: No difficulties  Cognition Arousal/Alertness: Awake/alert Behavior During Therapy: WFL for tasks assessed/performed Overall Cognitive Status: History of cognitive impairments - at baseline                                 General Comments: Pt very appropriate with answering questions and following cues        General Comments      Exercises     Assessment/Plan    PT Assessment Patient needs continued PT services  PT Problem List Decreased strength;Decreased activity tolerance;Decreased balance;Decreased mobility;Decreased knowledge of use of DME       PT Treatment Interventions DME instruction;Stair training;Gait training;Functional mobility training;Therapeutic activities;Therapeutic exercise;Patient/family education    PT Goals (Current goals can be found in the Care Plan section)  Acute Rehab PT Goals Patient Stated Goal: HOME PT Goal Formulation: With patient Time For Goal Achievement: 09/29/21 Potential to Achieve Goals: Good    Frequency Min 3X/week     Co-evaluation               AM-PAC PT "6 Clicks" Mobility  Outcome Measure Help needed turning from your back to your side while in a flat bed without using bedrails?: None Help needed moving from lying on your back to sitting on the side of a flat bed without using bedrails?: A Little Help needed moving to and from a bed to a chair (including a wheelchair)?: A Little Help needed standing up from a chair using your arms (e.g., wheelchair or bedside chair)?: A Little Help needed to walk in hospital room?: A Little Help needed climbing 3-5 steps with a railing? : A Lot 6 Click Score: 18    End of Session Equipment Utilized During Treatment: Gait belt Activity Tolerance: Patient tolerated treatment well Patient left: in chair;with call bell/phone within reach;with chair alarm set Nurse Communication: Mobility status PT Visit Diagnosis: Unsteadiness on feet (R26.81);Muscle  weakness (generalized) (M62.81)    Time: 1202-1217 PT Time Calculation (min) (ACUTE ONLY): 15 min   Charges:   PT Evaluation $PT Eval Low Complexity: 1 Low          [place] Pager [phone] Office [phone]   Bhavin Monjaraz 09/15/2021, 1:07 PM

## 2021-09-16 DIAGNOSIS — G309 Alzheimer's disease, unspecified: Secondary | ICD-10-CM | POA: Diagnosis not present

## 2021-09-16 DIAGNOSIS — I5032 Chronic diastolic (congestive) heart failure: Secondary | ICD-10-CM | POA: Diagnosis not present

## 2021-09-16 DIAGNOSIS — G9341 Metabolic encephalopathy: Secondary | ICD-10-CM

## 2021-09-16 DIAGNOSIS — R338 Other retention of urine: Secondary | ICD-10-CM

## 2021-09-16 DIAGNOSIS — N183 Chronic kidney disease, stage 3 unspecified: Secondary | ICD-10-CM | POA: Diagnosis not present

## 2021-09-16 DIAGNOSIS — G9340 Encephalopathy, unspecified: Secondary | ICD-10-CM | POA: Diagnosis not present

## 2021-09-16 LAB — BASIC METABOLIC PANEL
Anion gap: 6 (ref 5–15)
BUN: 29 mg/dL — ABNORMAL HIGH (ref 8–23)
CO2: 21 mmol/L — ABNORMAL LOW (ref 22–32)
Calcium: 9 mg/dL (ref 8.9–10.3)
Chloride: 116 mmol/L — ABNORMAL HIGH (ref 98–111)
Creatinine, Ser: 1.64 mg/dL — ABNORMAL HIGH (ref 0.61–1.24)
GFR, Estimated: 43 mL/min — ABNORMAL LOW (ref >60)
Glucose, Bld: 76 mg/dL (ref 70–99)
Potassium: 3.3 mmol/L — ABNORMAL LOW (ref 3.5–5.1)
Sodium: 143 mmol/L (ref 135–145)

## 2021-09-16 LAB — CBC
HCT: 27.5 % — ABNORMAL LOW (ref 39.0–52.0)
Hemoglobin: 8.8 g/dL — ABNORMAL LOW (ref 13.0–17.0)
MCH: 29.1 pg (ref 26.0–34.0)
MCHC: 32 g/dL (ref 30.0–36.0)
MCV: 91.1 fL (ref 80.0–100.0)
Platelets: 104 10*3/uL — ABNORMAL LOW (ref 150–400)
RBC: 3.02 MIL/uL — ABNORMAL LOW (ref 4.22–5.81)
RDW: 15.5 % (ref 11.5–15.5)
WBC: 5.4 10*3/uL (ref 4.0–10.5)
nRBC: 0 % (ref 0.0–0.2)

## 2021-09-16 LAB — MAGNESIUM: Magnesium: 1.8 mg/dL (ref 1.7–2.4)

## 2021-09-16 MED ORDER — POLYETHYLENE GLYCOL 3350 17 G PO PACK: 17.0000 g | PACK | Freq: Every day | ORAL | Status: DC | PRN

## 2021-09-16 MED ORDER — SENNOSIDES-DOCUSATE SODIUM 8.6-50 MG PO TABS
1.0000 | ORAL_TABLET | Freq: Two times a day (BID) | ORAL | Status: DC
  Administered 2021-09-16 – 2021-09-17 (×3): 1 via ORAL
  Filled 2021-09-16 (×3): qty 1

## 2021-09-16 MED ORDER — MELATONIN 3 MG PO TABS
3.0000 mg | ORAL_TABLET | Freq: Once | ORAL | Status: AC | PRN
  Administered 2021-09-16: 3 mg via ORAL
  Filled 2021-09-16: qty 1

## 2021-09-16 MED ORDER — SENNOSIDES-DOCUSATE SODIUM 8.6-50 MG PO TABS: 1.0000 | ORAL_TABLET | Freq: Two times a day (BID) | ORAL | 0 refills | Status: AC

## 2021-09-16 MED ORDER — POTASSIUM CHLORIDE CRYS ER 20 MEQ PO TBCR
40.0000 meq | EXTENDED_RELEASE_TABLET | Freq: Once | ORAL | Status: AC
  Administered 2021-09-16: 40 meq via ORAL
  Filled 2021-09-16: qty 2

## 2021-09-16 MED ORDER — POLYETHYLENE GLYCOL 3350 17 G PO PACK: 17.0000 g | PACK | Freq: Every day | ORAL | 0 refills | Status: AC | PRN

## 2021-09-16 MED ORDER — DOXAZOSIN MESYLATE 8 MG PO TABS: 8.0000 mg | ORAL_TABLET | Freq: Every day | ORAL | Status: AC

## 2021-09-16 NOTE — Progress Notes (Addendum)
PROGRESS NOTE    JAUAN WOHL  UPJ:[id number] DOB: February 08, 1945 DOA: 09/13/2021 PCP: [name], DO   Brief Narrative:   76 y.o. male with medical history significant for hypertension, chronic diastolic CHF, chronic kidney disease stage III, and Alzheimer dementia presented with increased confusion.  On presentation, chest x-ray showed no acute cardiopulmonary disease; no acute findings noted on CT of the head or cervical spine.  Creatinine was 2.61.  He was started on IV fluids.  Assessment & Plan:   Acute kidney injury on possible CKD stage III -No recent baseline creatinine; last creatinine in the system was 1.2 in 2016.  Creatinine 2.61 on presentation.  Treated with IV fluids.  Creatinine 1.64 today.  Off IV fluids -Repeat a.m. labs -Renal ultrasound was negative for hydronephrosis  Acute urinary retention -Possibly from BPH.  Possibly leading to acute kidney injury. -Currently has a coud catheter.  Will need outpatient follow-up with urology.  Started on Flomax.  Acute metabolic encephalopathy Dementia -Presented with worsening confusion.  Mental status improving and possibly back to baseline.  Monitor.  Fall precautions.   -Ammonia, vitamin B12 and TSH normal.   -Continue Exelon and Seroquel.  Delirium precautions. -We will need home health PT. -Palliative care evaluated the patient and CODE STATUS has been changed to DNR.  Hypernatremia -Improved.  Monitor  Hypokalemia -Replace.  Repeat a.m. labs  Anemia of chronic disease -Possibly from chronic kidney disease.  Hemoglobin stable.  Monitor intermittently.  Thrombocytopenia -No signs of bleeding.  Monitor intermittently.  Chronic diastolic CHF -Compensated.  Strict input and output.  Daily weights.  Diuretics and ACE inhibitor held.  Hypertension -Blood pressure currently stable.  Continue amlodipine    DVT prophylaxis: Heparin subcutaneous Code Status: DNR  family Communication: Daughter and  granddaughter at bedside disposition Plan: Status is: Inpatient Remains inpatient appropriate because: Possible discharge home tomorrow.  Consultants: None  Procedures: None  Antimicrobials: None   Subjective: Patient seen and examined at bedside.  Denies worsening abdominal pain.  No agitation, fever or vomiting reported.  Tolerating diet. Objective: Vitals:   09/15/21 0427 09/15/21 1346 09/15/21 2020 09/16/21 0528  BP: (!) 152/88 (!) 145/63 (!) 141/66 (!) 156/69  Pulse: 78 81 83 88  Resp: 18 20    Temp: 98 F (36.7 C) 98 F (36.7 C) 99.2 F (37.3 C) 99.5 F (37.5 C)  TempSrc: Oral Oral Oral Oral  SpO2: 97% 100% 100% 100%  Weight:      Height:        Intake/Output Summary (Last 24 hours) at 09/16/2021 1044 Last data filed at 09/16/2021 0149 Gross per 24 hour  Intake --  Output 525 ml  Net -525 ml    Filed Weights   09/13/21 1859  Weight: 70.3 kg    Examination:  General: On room air.  No distress.  Elderly male lying in bed.  Slow to respond.  Poor historian. respiratory: Decreased breath sounds at bases bilaterally with some crackles CVS: Currently rate controlled; S1-S2 heard  abdominal: Soft, nontender, slightly distended, no organomegaly; normal bowel sounds are heard  extremities: Trace lower extremity edema; no clubbing.   Genitourinary: Catheter present     Data Reviewed: I have personally reviewed following labs and imaging studies  CBC: Recent Labs  Lab 09/13/21 1958 09/14/21 0256 09/15/21 0548 09/16/21 0506  WBC 5.2 6.2 5.5 5.4  NEUTROABS 2.5  --   --   --   HGB 9.8* 9.9* 8.7* 8.8*  HCT 29.5* 30.1* 27.6*  27.5*  MCV 89.1 89.6 92.6 91.1  PLT 121* 133* 107* 104*    Basic Metabolic Panel: Recent Labs  Lab 09/13/21 1958 09/14/21 0256 09/15/21 0548 09/16/21 0506  NA 144 144 146* 143  K 3.5 3.3* 3.7 3.3*  CL 114* 116* 120* 116*  CO2 19* 17* 22 21*  GLUCOSE 72 68* 73 76  BUN 36* 36* 29* 29*  CREATININE 2.61* 2.39* 1.82* 1.64*   CALCIUM 9.6 9.9 8.8* 9.0  MG  --  1.7  --  1.8    GFR: Estimated Creatinine Clearance: 35.1 mL/min (A) (by C-G formula based on SCr of 1.64 mg/dL (H)). Liver Function Tests: Recent Labs  Lab 09/13/21 1958  AST 23  ALT 13  ALKPHOS 68  BILITOT 0.8  PROT 7.2  ALBUMIN 4.0    No results for input(s): "LIPASE", "AMYLASE" in the last 168 hours. Recent Labs  Lab 09/14/21 0249  AMMONIA 15    Coagulation Profile: No results for input(s): "INR", "PROTIME" in the last 168 hours. Cardiac Enzymes: No results for input(s): "CKTOTAL", "CKMB", "CKMBINDEX", "TROPONINI" in the last 168 hours. BNP (last 3 results) No results for input(s): "PROBNP" in the last 8760 hours. HbA1C: No results for input(s): "HGBA1C" in the last 72 hours. CBG: No results for input(s): "GLUCAP" in the last 168 hours. Lipid Profile: No results for input(s): "CHOL", "HDL", "LDLCALC", "TRIG", "CHOLHDL", "LDLDIRECT" in the last 72 hours. Thyroid Function Tests: Recent Labs    09/14/21 0249  TSH 4.243    Anemia Panel: Recent Labs    09/14/21 0249  VITAMINB12 327    Sepsis Labs: No results for input(s): "PROCALCITON", "LATICACIDVEN" in the last 168 hours.  Recent Results (from the past 240 hour(s))  Urine Culture     Status: Abnormal   Collection Time: 09/13/21  9:21 PM   Specimen: Urine, Clean Catch  Result Value Ref Range Status   Specimen Description   Final    URINE, CLEAN CATCH Performed at [facility], [place] [address], [place], [place] [address]    Special Requests   Final    NONE Performed at [facility], [place] [address], [place], [place] [address]    Culture (A)  Final    30,000 COLONIES/mL DIPHTHEROIDS(CORYNEBACTERIUM SPECIES) Standardized susceptibility testing for this organism is not available. 10,000 COLONIES/mL GROUP B STREP(S.AGALACTIAE)ISOLATED TESTING AGAINST S. AGALACTIAE NOT ROUTINELY PERFORMED DUE TO PREDICTABILITY OF AMP/PEN/VAN  SUSCEPTIBILITY. Performed at [facility], [place] [address] [place], [place] [address]    Report Status 09/15/2021 FINAL  Final  Resp Panel by RT-PCR (Flu A&B, Covid) Anterior Nasal Swab     Status: None   Collection Time: 09/13/21  9:45 PM   Specimen: Anterior Nasal Swab  Result Value Ref Range Status   SARS Coronavirus 2 by RT PCR NEGATIVE NEGATIVE Final    Comment: (NOTE) SARS-CoV-2 target nucleic acids are NOT DETECTED.  The SARS-CoV-2 RNA is generally detectable in upper respiratory specimens during the acute phase of infection. The lowest concentration of SARS-CoV-2 viral copies this assay can detect is 138 copies/mL. A negative result does not preclude SARS-Cov-2 infection and should not be used as the sole basis for treatment or other patient management decisions. A negative result may occur with  improper specimen collection/handling, submission of specimen other than nasopharyngeal swab, presence of viral mutation(s) within the areas targeted by this assay, and inadequate number of viral copies(<138 copies/mL). A negative result must be combined with clinical observations, patient  history, and epidemiological information. The expected result is Negative.  Fact Sheet for Patients:  EntrepreneurPulse.com.au  Fact Sheet for Healthcare Providers:  IncredibleEmployment.be  This test is no t yet approved or cleared by the [place] FDA and  has been authorized for detection and/or diagnosis of SARS-CoV-2 by FDA under an Emergency Use Authorization (EUA). This EUA will remain  in effect (meaning this test can be used) for the duration of the COVID-19 declaration under Section 564(b)(1) of the Act, 21 U.S.C.section 360bbb-3(b)(1), unless the authorization is terminated  or revoked sooner.       Influenza A by PCR NEGATIVE NEGATIVE Final   Influenza B by PCR NEGATIVE NEGATIVE Final    Comment: (NOTE) The Xpert Xpress  SARS-CoV-2/FLU/RSV plus assay is intended as an aid in the diagnosis of influenza from Nasopharyngeal swab specimens and should not be used as a sole basis for treatment. Nasal washings and aspirates are unacceptable for Xpert Xpress SARS-CoV-2/FLU/RSV testing.  Fact Sheet for Patients: EntrepreneurPulse.com.au  Fact Sheet for Healthcare Providers: IncredibleEmployment.be  This test is not yet approved or cleared by the [place] FDA and has been authorized for detection and/or diagnosis of SARS-CoV-2 by FDA under an Emergency Use Authorization (EUA). This EUA will remain in effect (meaning this test can be used) for the duration of the COVID-19 declaration under Section 564(b)(1) of the Act, 21 U.S.C. section 360bbb-3(b)(1), unless the authorization is terminated or revoked.  Performed at [facility], [place] [address], [place], [place] [address]          Radiology Studies: US RENAL  Result Date: 09/15/2021 CLINICAL DATA:  Renal dysfunction EXAM: RENAL / URINARY TRACT ULTRASOUND COMPLETE COMPARISON:  12/02/2016 FINDINGS: Right Kidney: Renal measurements: 8.8 x 5.4 x 5.9 cm = volume: 146.7 mL. There is no hydronephrosis. There is cortical thinning. Left Kidney: Renal measurements: 9.6 x 5.2 x 5.2 cm = volume: 134.5 mL. There is no hydronephrosis. There is a 2.3 x 1.9 cm simple appearing cyst in the midportion. No follow-up imaging is recommended. Bladder: Urinary bladder is empty due to indwelling Foley catheter. Other: None. IMPRESSION: There is no hydronephrosis.  Left renal cyst. Electronically Signed   By: Elmer Picker M.D.   On: 09/15/2021 12:39        Scheduled Meds:  amLODipine  10 mg Oral Daily   Chlorhexidine Gluconate Cloth  6 each Topical Daily   heparin  5,000 Units Subcutaneous Q8H   QUEtiapine  12.5 mg Oral BID   rivastigmine  3 mg Oral BID   senna-docusate  1 tablet Oral BID   sodium chloride  flush  3 mL Intravenous Q12H   tamsulosin  0.4 mg Oral QPC supper   Continuous Infusions:        Aline August, MD [facility] 09/16/2021, 10:44 AM

## 2021-09-16 NOTE — Plan of Care (Signed)

## 2021-09-16 NOTE — Progress Notes (Signed)
Mobility Specialist - Progress Note   09/16/21 1000  Mobility  Activity Ambulated with assistance to bathroom  Level of Assistance Contact guard assist, steadying assist  Assistive Device Front wheel walker  Distance Ambulated (ft) 20 ft  Activity Response Tolerated well  $Mobility charge 1 Mobility   Pt received in bed and agreed for mobility, needed to have a BM prior. After BM pt decided he no longer wanted to ambulate, returned to bed with all needs met and NT in room. Will try again if schedule permits.   Roderick Pee Mobility Specialist

## 2021-09-16 NOTE — TOC Transition Note (Signed)
Transition of Care [facility]) - CM/SW Discharge Note   Patient Details  Name: Gerald [name] MRN: [id number] Date of Birth: [birth year]  Transition of Care [facility] CM/SW Contact:  Vassie Moselle, LCSW Phone Number: 09/16/2021, 10:22 AM   Clinical Narrative:    Met with pt's daughter in hallway and discussed current home services. Pt's daughter reports that pt has been receiving home health SLP,PT,OT, aide, and SW through [facility]. Pt's daughter is interested in 24/7 care for this pt and CSW discussed that these services are private pay as insurance does not typically cover these services. Pt's daughter is understanding of this and plans to explore obtaining a 24/7 care giver for her father. Pt's daughter is interested in outpatient palliative care services for this pt and is familiar with [facility]. Pt has been referred to [facility] for outpatient palliative care. Pt will need resumption of care orders placed for home health services prior to discharge.    Final next level of care: [place] Barriers to Discharge: No Barriers Identified   Patient Goals and CMS Choice Patient states their goals for this hospitalization and ongoing recovery are:: To return home with additional supports   Choice offered to / list presented to : Adult Children  Discharge Placement                       Discharge Plan and Services In-house Referral: Hospice / Palliative Care Discharge Planning Services: CM Consult Post Acute Care Choice: Home Health          DME Arranged: N/A DME Agency: NA       HH Arranged: Social Work, Theme park manager Therapy, PT, OT, Nurse's Aide [place] Agency: [place] ([place]) Date HH Agency Contacted: 09/16/21 Time [place]: 1021 Representative spoke with at [place]: L[place]ia [place]  Social Determinants of Health (SDOH) Interventions Food Insecurity Interventions: Intervention Not Indicated Transportation Interventions:  Intervention Not Indicated Utilities Interventions: Intervention Not Indicated   Readmission Risk Interventions    09/16/2021   10:18 AM  Readmission Risk Prevention Plan  Transportation Screening Complete  PCP or Specialist Appt within 5-7 Days Complete  Home Care Screening Complete  Medication Review (RN CM) Complete

## 2021-09-16 NOTE — Telephone Encounter (Addendum)
Pt is currently admitted in the hospital we have f/u on the books for Nov will move up if necessary.

## 2021-09-16 NOTE — Telephone Encounter (Signed)
Please call pt's daughter back and schedule f/u visit.  thanks

## 2021-09-16 NOTE — Progress Notes (Signed)
[facility] [id number] [facility]) Hospital Liaison note:  Notified by Normajean Baxter of request for [facility] Palliative Care services. Will continue to follow for disposition.  Please call with any outpatient palliative questions or concerns.  Thank you for the opportunity to participate in this patient's care.  Thank you, Lorelee Market, LPN [facility] Liaison [phone]

## 2021-09-16 NOTE — Telephone Encounter (Signed)
Pt's daughter, Tamala Bari (on [place]) pt will be discharged the hospital tomorrow. Pt is combative, outburst of anger, pt walk around naked in front of the grandchild. Would like a call from the nurse to discuss what to do next.  Please me at [phone]

## 2021-09-16 NOTE — Progress Notes (Signed)
Daily Progress Note   Patient Name: Gerald [name]       Date: 09/16/2021 DOB: February 13, 1945  Age: 76 y.o. MRN#: [id number] Attending Physician: Aline August, MD Primary Care Physician: [name], DO Admit Date: 09/13/2021  Reason for Consultation/Follow-up: Establishing goals of care  Subjective: Awake alert resting in bed, wants to go home, denies pain.   Length of Stay: 2  Current Medications: Scheduled Meds:   amLODipine  10 mg Oral Daily   Chlorhexidine Gluconate Cloth  6 each Topical Daily   heparin  5,000 Units Subcutaneous Q8H   QUEtiapine  12.5 mg Oral BID   rivastigmine  3 mg Oral BID   senna-docusate  1 tablet Oral BID   sodium chloride flush  3 mL Intravenous Q12H   tamsulosin  0.4 mg Oral QPC supper    Continuous Infusions:   PRN Meds: acetaminophen **OR** acetaminophen, polyethylene glycol  Physical Exam         No distress Has baseline dementia Trace edema Has foley catheter Decreased breath sounds bilaterally  Vital Signs: BP (!) 156/69 (BP Location: Left Arm)   Pulse 88   Temp 99.5 F (37.5 C) (Oral)   Resp 20   Ht '5\' 6"'$  (1.676 m)   Wt 70.3 kg   SpO2 100%   BMI 25.02 kg/m  SpO2: SpO2: 100 % O2 Device: O2 Device: Room Air O2 Flow Rate:    Intake/output summary:  Intake/Output Summary (Last 24 hours) at 09/16/2021 1258 Last data filed at 09/16/2021 1254 Gross per 24 hour  Intake --  Output 1825 ml  Net -1825 ml   LBM: Last BM Date : 09/16/21 Baseline Weight: Weight: 70.3 kg Most recent weight: Weight: 70.3 kg       Palliative Assessment/Data:      Patient Active Problem List   Diagnosis Date Noted   Acute urinary retention 09/16/2021   Alzheimer's dementia with behavioral disturbance ([place]) 09/14/2021   Prolonged QT interval  10/12/1217   Acute metabolic encephalopathy 75/88/3254   AKI (acute kidney injury) ([place])    Acute renal failure superimposed on stage 3 chronic kidney disease ([name])    Acute encephalopathy 09/13/2021   Anemia of chronic renal failure 06/26/2021   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   Posterior vitreous detachment of both eyes 12/24/2020  Anemia of chronic disease 11/07/2020   Recurrent falls 11/07/2020   Pseudophakia of both eyes 10/16/2020   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 ([place]) 10/16/2020   Macular pucker, right eye 10/16/2020   Mild nonproliferative diabetic retinopathy of left eye ([place]) 10/16/2020   Anxiety state 01/04/2020   Benign prostatic hyperplasia without lower urinary tract symptoms 01/04/2020   Chronic diastolic heart failure ([place]) 01/04/2020   Diabetic renal disease ([place]) 01/04/2020   Diabetic retinopathy ([place]) 01/04/2020   ED (erectile dysfunction) of organic origin 01/04/2020   Hypertrophic cardiomyopathy ([place]) 01/04/2020   Insomnia 01/04/2020   Left bundle branch block 01/04/2020   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   Primary localized osteoarthritis of pelvic region and thigh 01/04/2020   Stage 3 chronic kidney disease ([place]) 01/04/2020   Vitamin B12 deficiency (non anemic) 01/04/2020   Recurrent major depression ([place]) 01/04/2020   Severe episode of recurrent major depressive disorder, without psychotic features ([place]) 10/05/2018   Cognitive complaints with normal neuropsychological exam 03/11/2018   Referred otalgia of left ear 09/23/2016   Temporomandibular joint (TMJ) pain 09/23/2016   Cervicogenic headache 09/03/2016   Obstructive sleep apnea syndrome 11/15/2015   Memory difficulties 10/05/2014   Abnormality of gait 10/05/2014   Intermittent explosive disorder 09/25/2014   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    Encounter for long-term (current) use of other medications 04/28/2013   Vitamin D Deficiency 04/28/2013   Sleep disorder -Epworth OSA scale = 15 06/02/2012   Anal fissure 08/09/2010   Septic arthritis of hip ([place]) 04/11/2010   HYPERLIPIDEMIA 01/11/2010   DEPRESSION 01/11/2010   Hypertension 01/11/2010   GERD 01/11/2010   Depression 01/11/2010   Hyperlipidemia 01/11/2010   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  Patient sees neurology in the outpatient setting.  Patient admitted for increased confusion and also had a fall.  Patient was noted to have confusion and hallucinations falls and agitation over the course of the past 1 to 2 days prior to his family bringing him to the emergency room.  Patient admitted to hospital medicine service, required Foley catheter for acute urinary retention, being worked up for acute kidney injury.  Remains on fall precautions.  Physical therapy evaluation is being done.  Palliative medicine consultation for further goals of care discussions has been requested.  Recommendations/Plan: DNR Recommend outpatient palliative care.     Code Status:    Code Status Orders  (From admission, onward)           Start     Ordered   09/15/21 1345  Do not attempt resuscitation (DNR)  Continuous       Question Answer Comment  In the event of cardiac or respiratory ARREST Do not call a "code blue"   In the event of cardiac or respiratory ARREST Do not perform Intubation, CPR, defibrillation or ACLS   In the event of cardiac or respiratory ARREST Use medication by any  route, position, wound care, and other measures to relive pain and suffering. May use oxygen, suction and manual  treatment of airway obstruction as needed for comfort.      09/15/21 1345           Code Status History     Date Active Date Inactive Code Status Order ID Comments User Context   09/14/2021 0248 09/15/2021 1345 Full Code [id number]  Vianne Bulls, MD ED       Prognosis:  Unable to determine  Discharge Planning: To Be Determined  Care plan was discussed with  patient, IDT  Thank you for allowing the Palliative Medicine Team to assist in the care of this patient.  Low MDM     Greater than 50%  of this time was spent counseling and coordinating care related to the above assessment and plan.  Loistine Chance, MD  Please contact Palliative Medicine Team phone at [phone] for questions and concerns.

## 2021-09-17 DIAGNOSIS — G309 Alzheimer's disease, unspecified: Secondary | ICD-10-CM | POA: Diagnosis not present

## 2021-09-17 DIAGNOSIS — R338 Other retention of urine: Secondary | ICD-10-CM

## 2021-09-17 DIAGNOSIS — I5032 Chronic diastolic (congestive) heart failure: Secondary | ICD-10-CM | POA: Diagnosis not present

## 2021-09-17 DIAGNOSIS — G9340 Encephalopathy, unspecified: Secondary | ICD-10-CM | POA: Diagnosis not present

## 2021-09-17 LAB — BASIC METABOLIC PANEL
Anion gap: 5 (ref 5–15)
BUN: 23 mg/dL (ref 8–23)
CO2: 22 mmol/L (ref 22–32)
Calcium: 9 mg/dL (ref 8.9–10.3)
Chloride: 115 mmol/L — ABNORMAL HIGH (ref 98–111)
Creatinine, Ser: 1.77 mg/dL — ABNORMAL HIGH (ref 0.61–1.24)
GFR, Estimated: 40 mL/min — ABNORMAL LOW (ref >60)
Glucose, Bld: 89 mg/dL (ref 70–99)
Potassium: 3.9 mmol/L (ref 3.5–5.1)
Sodium: 142 mmol/L (ref 135–145)

## 2021-09-17 LAB — MAGNESIUM: Magnesium: 1.6 mg/dL — ABNORMAL LOW (ref 1.7–2.4)

## 2021-09-17 MED ORDER — MAGNESIUM SULFATE 2 GM/50ML IV SOLN
2.0000 g | Freq: Once | INTRAVENOUS | Status: AC
  Administered 2021-09-17: 2 g via INTRAVENOUS
  Filled 2021-09-17: qty 50

## 2021-09-17 NOTE — Discharge Summary (Signed)
Physician Discharge Summary  Gerald [name]:[id number] DOB: 1945-04-28 DOA: 09/13/2021  PCP: [name], DO  Admit date: 09/13/2021 Discharge date: 09/17/2021  Admitted From: Home Disposition: Home  Recommendations for Outpatient Follow-up:  Follow up with PCP in 1 week with repeat CBC/BMP Outpatient follow-up with urology and palliative care Follow up in ED if symptoms worsen or new appear   Home Health: Home with PT/OT equipment/Devices: Catheter placed during the hospitalization  Discharge Condition: Guarded CODE STATUS: DNR Diet recommendation: Heart healthy  Brief/Interim Summary: 76 y.o. male with medical history significant for hypertension, chronic diastolic CHF, chronic kidney disease stage III, and Alzheimer dementia presented with increased confusion.  On presentation, chest x-ray showed no acute cardiopulmonary disease; no acute findings noted on CT of the head or cervical spine.  Creatinine was 2.61.  He was started on IV fluids.  During the hospitalization, he required catheter placement for retention and started on Flomax.  Creatinine has improved.  He is off IV fluids.  PT recommended home LPD.  He will be discharged home today with outpatient follow-up with PCP and urology.  Discharge Diagnoses:   Acute kidney injury on possible CKD stage III -No recent baseline creatinine; last creatinine in the system was 1.2 in 2016.  Creatinine 2.61 on presentation.  Treated with IV fluids.  Creatinine 1.77 today.  Off IV fluids -renal ultrasound was negative for hydronephrosis -Outpatient follow-up.  Discharge patient home today. -Diuretics remain on hold on discharge.  Outpatient follow-up.   Acute urinary retention -Possibly from BPH.  Possibly leading to acute kidney injury. -Currently has a coud catheter.  Will need outpatient follow-up with urology.  Started on Flomax.   Acute metabolic encephalopathy Dementia -Presented with worsening confusion.  Mental status  improving and possibly back to baseline.  Monitor.  Fall precautions.   -Ammonia, vitamin B12 and TSH normal.   -Continue Exelon and Seroquel.  Delirium precautions. -will need home health PT. -Palliative care evaluated the patient and CODE STATUS has been changed to DNR.  Recommend outpatient follow-up with palliative care   Hypernatremia -Improved.  Monitor   Hypokalemia -Improved   Anemia of chronic disease -Possibly from chronic kidney disease.  Hemoglobin stable.  Outpatient follow-up.  Thrombocytopenia -No signs of bleeding.  Outpatient follow-up.  Chronic diastolic CHF -Compensated.  Continue diet and fluid restriction.  Diuretics to remain on hold on discharge.  Outpatient follow-up.  Hypertension -Blood pressure currently stable.  Continue amlodipine  Hyperlipidemia -Continue statin    Discharge Instructions  Discharge Instructions     Diet - low sodium heart healthy   Complete by: As directed    Increase activity slowly   Complete by: As directed       Allergies as of 09/17/2021   No Known Allergies      Medication List     STOP taking these medications    bisoprolol-hydrochlorothiazide 2.5-6.25 MG tablet Commonly known as: ZIAC   furosemide 20 MG tablet Commonly known as: LASIX       TAKE these medications    amLODipine 10 MG tablet Commonly known as: NORVASC Take 10 mg by mouth in the morning.   atorvastatin 10 MG tablet Commonly known as: LIPITOR Take 10 mg by mouth at bedtime.   buPROPion 150 MG 24 hr tablet Commonly known as: WELLBUTRIN XL TAKE 1 TABLET (150 MG TOTAL) BY MOUTH EVERY MORNING. What changed:  how much to take how to take this when to take this additional instructions   Centrum  Silver 50+Men Tabs Take 1 tablet by mouth daily with breakfast.   doxazosin 8 MG tablet Commonly known as: CARDURA Take 1 tablet (8 mg total) by mouth at bedtime.   ferrous sulfate 325 (65 FE) MG tablet Take 325 mg by mouth daily  with breakfast.   FLUoxetine 40 MG capsule Commonly known as: PROZAC Take 1 capsule (40 mg total) by mouth daily. What changed: when to take this   Melatonin 5 MG Subl Place 5 mg under the tongue at bedtime as needed (sleep).   memantine 10 MG tablet Commonly known as: Namenda Take 1 tablet (10 mg total) by mouth 2 (two) times daily.   omeprazole 40 MG capsule Commonly known as: PRILOSEC Take 1 capsule (40 mg total) by mouth daily. What changed:  when to take this additional instructions   polyethylene glycol 17 g packet Commonly known as: MIRALAX / GLYCOLAX Take 17 g by mouth daily as needed for moderate constipation.   QUEtiapine 25 MG tablet Commonly known as: SEROquel Take 0.5 tablets (12.5 mg total) by mouth 2 (two) times daily.   rivastigmine 3 MG capsule Commonly known as: EXELON Take 1 capsule (3 mg total) by mouth 2 (two) times daily.   senna-docusate 8.6-50 MG tablet Commonly known as: Senokot-S Take 1 tablet by mouth 2 (two) times daily.        Follow-up Information     [facility]. Schedule an appointment as soon as possible for a visit in 1 week(s).   Contact information: [facility] [place]        [name], DO. Schedule an appointment as soon as possible for a visit in 1 week(s).   Specialty: Family Medicine Contact information: [address] [place] [address] [facility] up.   Why: [facility] will follow up with you at discharge to continue home health services for speech, physical, and occupational therapy as well as nursing aide and social work services.        [facility] up.   Why: [facility] is to provide outpatient palliative care Contact information: [place] [phone]               No Known  Allergies  Consultations: Palliative care   Procedures/Studies: US RENAL  Result Date: 09/15/2021 CLINICAL DATA:  Renal dysfunction EXAM: RENAL / URINARY TRACT ULTRASOUND COMPLETE COMPARISON:  12/02/2016 FINDINGS: Right Kidney: Renal measurements: 8.8 x 5.4 x 5.9 cm = volume: 146.7 mL. There is no hydronephrosis. There is cortical thinning. Left Kidney: Renal measurements: 9.6 x 5.2 x 5.2 cm = volume: 134.5 mL. There is no hydronephrosis. There is a 2.3 x 1.9 cm simple appearing cyst in the midportion. No follow-up imaging is recommended. Bladder: Urinary bladder is empty due to indwelling Foley catheter. Other: None. IMPRESSION: There is no hydronephrosis.  Left renal cyst. Electronically Signed   By: Elmer Picker M.D.   On: 09/15/2021 12:39   CT Head Wo Contrast  Result Date: 09/13/2021 CLINICAL DATA:  Unwitnessed fall EXAM: CT HEAD WITHOUT CONTRAST CT CERVICAL SPINE WITHOUT CONTRAST TECHNIQUE: Multidetector CT imaging of the head and cervical spine was performed following the standard protocol without intravenous contrast. Multiplanar CT image reconstructions of the cervical spine were also generated. RADIATION DOSE REDUCTION: This exam was performed according to the departmental dose-optimization program which includes automated exposure control, adjustment  of the mA and/or kV according to patient size and/or use of iterative reconstruction technique. COMPARISON:  CT brain 09/26/2014, CT cervical spine 07/15/2013 FINDINGS: CT HEAD FINDINGS Brain: No acute territorial infarction, hemorrhage or intracranial mass. Atrophy. Mild chronic small vessel ischemic changes of the white matter. Mildly prominent ventricles felt secondary to atrophy Vascular: No hyperdense vessels.  Carotid vascular calcification Skull: Normal. Negative for fracture or focal lesion. Sinuses/Orbits: No acute finding. Other: None CT CERVICAL SPINE FINDINGS Alignment: No subluxation.  Facet alignment within normal limits.  Skull base and vertebrae: No acute fracture. No primary bone lesion or focal pathologic process. Soft tissues and spinal canal: No prevertebral fluid or swelling. No visible canal hematoma. Disc levels: Multilevel degenerative changes. Moderate disc space narrowing C5-C6. Partially calcified posterior disc osteophyte C4-C5 with moderate central focal disc protrusion and mass effect on thecal sac. Multilevel foraminal narrowing with severe bilateral foraminal narrowing at C5-C6. Upper chest: Negative. Other: None IMPRESSION: 1. No CT evidence for acute intracranial abnormality. Atrophy and mild chronic small vessel ischemic changes of the white matter 2. Multilevel degenerative changes of the cervical spine. No acute osseous abnormality Electronically Signed   By: Donavan Foil M.D.   On: 09/13/2021 23:35   CT Cervical Spine Wo Contrast  Result Date: 09/13/2021 CLINICAL DATA:  Unwitnessed fall EXAM: CT HEAD WITHOUT CONTRAST CT CERVICAL SPINE WITHOUT CONTRAST TECHNIQUE: Multidetector CT imaging of the head and cervical spine was performed following the standard protocol without intravenous contrast. Multiplanar CT image reconstructions of the cervical spine were also generated. RADIATION DOSE REDUCTION: This exam was performed according to the departmental dose-optimization program which includes automated exposure control, adjustment of the mA and/or kV according to patient size and/or use of iterative reconstruction technique. COMPARISON:  CT brain 09/26/2014, CT cervical spine 07/15/2013 FINDINGS: CT HEAD FINDINGS Brain: No acute territorial infarction, hemorrhage or intracranial mass. Atrophy. Mild chronic small vessel ischemic changes of the white matter. Mildly prominent ventricles felt secondary to atrophy Vascular: No hyperdense vessels.  Carotid vascular calcification Skull: Normal. Negative for fracture or focal lesion. Sinuses/Orbits: No acute finding. Other: None CT CERVICAL SPINE FINDINGS Alignment: No  subluxation.  Facet alignment within normal limits. Skull base and vertebrae: No acute fracture. No primary bone lesion or focal pathologic process. Soft tissues and spinal canal: No prevertebral fluid or swelling. No visible canal hematoma. Disc levels: Multilevel degenerative changes. Moderate disc space narrowing C5-C6. Partially calcified posterior disc osteophyte C4-C5 with moderate central focal disc protrusion and mass effect on thecal sac. Multilevel foraminal narrowing with severe bilateral foraminal narrowing at C5-C6. Upper chest: Negative. Other: None IMPRESSION: 1. No CT evidence for acute intracranial abnormality. Atrophy and mild chronic small vessel ischemic changes of the white matter 2. Multilevel degenerative changes of the cervical spine. No acute osseous abnormality Electronically Signed   By: Donavan Foil M.D.   On: 09/13/2021 23:35   DG Pelvis Portable  Result Date: 09/13/2021 CLINICAL DATA:  Fall. EXAM: PORTABLE PELVIS 1-2 VIEWS COMPARISON:  Pelvic radiograph dated 02/06/2010. 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 Postsurgical changes of resection of the left femoral neck. There is elevation of the left femur in relation to the acetabulum. No acute fracture. The bones are osteopenic. The soft tissues are unremarkable. Vascular calcifications noted. Degenerative changes of the lower lumbar spine. IMPRESSION: 1. No acute fracture. 2. 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 Electronically Signed   By: Anner Crete M.D.   On: 09/13/2021  20:26   DG Chest Portable 1 View  Result Date: 09/13/2021 CLINICAL DATA:  Trauma, fall EXAM: PORTABLE CHEST 1 VIEW COMPARISON:  07/15/2013 FINDINGS: Cardiac size is within normal limits. Lung fields are clear of any infiltrates or pulmonary edema. There is no pleural effusion or pneumothorax. Small bony spurs are seen in both shoulders. IMPRESSION: No active disease. Electronically Signed   By: Elmer Picker M.D.   On: 09/13/2021 20:26      Subjective: Patient seen and examined at bedside.  Awake, answers some questions but slow to respond and a poor historian.  No fever, agitation, vomiting or seizures reported.  Discharge Exam: Vitals:   09/16/21 2008 09/17/21 0441  BP: (!) 144/65 (!) 145/65  Pulse: 84 75  Resp: 18 18  Temp: 98.4 F (36.9 C) 98.1 F (36.7 C)  SpO2: 100% 99%    General: Pt is awake, not in acute distress.  Slow to respond, poor historian. Cardiovascular: rate controlled, S1/S2 + Respiratory: bilateral decreased breath sounds at bases Abdominal: Soft, NT, ND, bowel sounds + Extremities: Trace lower extremity edema present; no cyanosis Genitourinary: Catheter present    The results of significant diagnostics from this hospitalization (including imaging, microbiology, ancillary and laboratory) are listed below for reference.     Microbiology: Recent Results (from the past 240 hour(s))  Urine Culture     Status: Abnormal   Collection Time: 09/13/21  9:21 PM   Specimen: Urine, Clean Catch  Result Value Ref Range Status   Specimen Description   Final    URINE, CLEAN CATCH Performed at [facility], [place] [address], [place], [place] [address]    Special Requests   Final    NONE Performed at [facility], [place] [address], [place], [place] [address]    Culture (A)  Final    30,000 COLONIES/mL DIPHTHEROIDS(CORYNEBACTERIUM SPECIES) Standardized susceptibility testing for this organism is not available. 10,000 COLONIES/mL GROUP B STREP(S.AGALACTIAE)ISOLATED TESTING AGAINST S. AGALACTIAE NOT ROUTINELY PERFORMED DUE TO PREDICTABILITY OF AMP/PEN/VAN SUSCEPTIBILITY. Performed at [facility], [place] [address], [place], [place] [address]    Report Status 09/15/2021 FINAL  Final  Resp Panel by RT-PCR (Flu A&B, Covid) Anterior Nasal Swab     Status: None   Collection Time: 09/13/21  9:45 PM   Specimen: Anterior  Nasal Swab  Result Value Ref Range Status   SARS Coronavirus 2 by RT PCR NEGATIVE NEGATIVE Final    Comment: (NOTE) SARS-CoV-2 target nucleic acids are NOT DETECTED.  The SARS-CoV-2 RNA is generally detectable in upper respiratory specimens during the acute phase of infection. The lowest concentration of SARS-CoV-2 viral copies this assay can detect is 138 copies/mL. A negative result does not preclude SARS-Cov-2 infection and should not be used as the sole basis for treatment or other patient management decisions. A negative result may occur with  improper specimen collection/handling, submission of specimen other than nasopharyngeal swab, presence of viral mutation(s) within the areas targeted by this assay, and inadequate number of viral copies(<138 copies/mL). A negative result must be combined with clinical observations, patient history, and epidemiological information. The expected result is Negative.  Fact Sheet for Patients:  EntrepreneurPulse.com.au  Fact Sheet for Healthcare Providers:  IncredibleEmployment.be  This test is no t yet approved or cleared by the [place] FDA and  has been authorized for detection and/or diagnosis of SARS-CoV-2 by FDA under an Emergency Use Authorization (EUA). This EUA will remain  in effect (meaning this test  can be used) for the duration of the COVID-19 declaration under Section 564(b)(1) of the Act, 21 U.S.C.section 360bbb-3(b)(1), unless the authorization is terminated  or revoked sooner.       Influenza A by PCR NEGATIVE NEGATIVE Final   Influenza B by PCR NEGATIVE NEGATIVE Final    Comment: (NOTE) The Xpert Xpress SARS-CoV-2/FLU/RSV plus assay is intended as an aid in the diagnosis of influenza from Nasopharyngeal swab specimens and should not be used as a sole basis for treatment. Nasal washings and aspirates are unacceptable for Xpert Xpress SARS-CoV-2/FLU/RSV testing.  Fact Sheet for  Patients: EntrepreneurPulse.com.au  Fact Sheet for Healthcare Providers: IncredibleEmployment.be  This test is not yet approved or cleared by the [place] FDA and has been authorized for detection and/or diagnosis of SARS-CoV-2 by FDA under an Emergency Use Authorization (EUA). This EUA will remain in effect (meaning this test can be used) for the duration of the COVID-19 declaration under Section 564(b)(1) of the Act, 21 U.S.C. section 360bbb-3(b)(1), unless the authorization is terminated or revoked.  Performed at [facility], [place] [address], [place], [place] [address]      Labs: BNP (last 3 results) No results for input(s): "BNP" in the last 8760 hours. Basic Metabolic Panel: Recent Labs  Lab 09/13/21 1958 09/14/21 0256 09/15/21 0548 09/16/21 0506 09/17/21 0533  NA 144 144 146* 143 142  K 3.5 3.3* 3.7 3.3* 3.9  CL 114* 116* 120* 116* 115*  CO2 19* 17* 22 21* 22  GLUCOSE 72 68* 73 76 89  BUN 36* 36* 29* 29* 23  CREATININE 2.61* 2.39* 1.82* 1.64* 1.77*  CALCIUM 9.6 9.9 8.8* 9.0 9.0  MG  --  1.7  --  1.8 1.6*   Liver Function Tests: Recent Labs  Lab 09/13/21 1958  AST 23  ALT 13  ALKPHOS 68  BILITOT 0.8  PROT 7.2  ALBUMIN 4.0   No results for input(s): "LIPASE", "AMYLASE" in the last 168 hours. Recent Labs  Lab 09/14/21 0249  AMMONIA 15   CBC: Recent Labs  Lab 09/13/21 1958 09/14/21 0256 09/15/21 0548 09/16/21 0506  WBC 5.2 6.2 5.5 5.4  NEUTROABS 2.5  --   --   --   HGB 9.8* 9.9* 8.7* 8.8*  HCT 29.5* 30.1* 27.6* 27.5*  MCV 89.1 89.6 92.6 91.1  PLT 121* 133* 107* 104*   Cardiac Enzymes: No results for input(s): "CKTOTAL", "CKMB", "CKMBINDEX", "TROPONINI" in the last 168 hours. BNP: Invalid input(s): "POCBNP" CBG: No results for input(s): "GLUCAP" in the last 168 hours. D-Dimer No results for input(s): "DDIMER" in the last 72 hours. Hgb A1c No results for input(s): "HGBA1C" in the  last 72 hours. Lipid Profile No results for input(s): "CHOL", "HDL", "LDLCALC", "TRIG", "CHOLHDL", "LDLDIRECT" in the last 72 hours. Thyroid function studies No results for input(s): "TSH", "T4TOTAL", "T3FREE", "THYROIDAB" in the last 72 hours.  Invalid input(s): "FREET3" Anemia work up No results for input(s): "VITAMINB12", "FOLATE", "FERRITIN", "TIBC", "IRON", "RETICCTPCT" in the last 72 hours. Urinalysis    Component Value Date/Time   COLORURINE STRAW (A) 09/13/2021 2121   APPEARANCEUR CLEAR 09/13/2021 2121   LABSPEC 1.008 09/13/2021 2121   PHURINE 7.0 09/13/2021 2121   GLUCOSEU NEGATIVE 09/13/2021 2121   HGBUR NEGATIVE 09/13/2021 2121   BILIRUBINUR NEGATIVE 09/13/2021 2121   KETONESUR NEGATIVE 09/13/2021 2121   PROTEINUR NEGATIVE 09/13/2021 2121   UROBILINOGEN 0.2 07/15/2013 1359   NITRITE NEGATIVE 09/13/2021 2121   LEUKOCYTESUR NEGATIVE 09/13/2021 2121   Sepsis Labs  Recent Labs  Lab 09/13/21 1958 09/14/21 0256 09/15/21 0548 09/16/21 0506  WBC 5.2 6.2 5.5 5.4   Microbiology Recent Results (from the past 240 hour(s))  Urine Culture     Status: Abnormal   Collection Time: 09/13/21  9:21 PM   Specimen: Urine, Clean Catch  Result Value Ref Range Status   Specimen Description   Final    URINE, CLEAN CATCH Performed at [facility], [place] [address], [place], [place] [address]    Special Requests   Final    NONE Performed at [facility], [place] [address] [place], [place] [address]    Culture (A)  Final    30,000 COLONIES/mL DIPHTHEROIDS(CORYNEBACTERIUM SPECIES) Standardized susceptibility testing for this organism is not available. 10,000 COLONIES/mL GROUP B STREP(S.AGALACTIAE)ISOLATED TESTING AGAINST S. AGALACTIAE NOT ROUTINELY PERFORMED DUE TO PREDICTABILITY OF AMP/PEN/VAN SUSCEPTIBILITY. Performed at [facility], [place] [address] [place], [place] [address]    Report Status 09/15/2021 FINAL  Final  Resp Panel by RT-PCR  (Flu A&B, Covid) Anterior Nasal Swab     Status: None   Collection Time: 09/13/21  9:45 PM   Specimen: Anterior Nasal Swab  Result Value Ref Range Status   SARS Coronavirus 2 by RT PCR NEGATIVE NEGATIVE Final    Comment: (NOTE) SARS-CoV-2 target nucleic acids are NOT DETECTED.  The SARS-CoV-2 RNA is generally detectable in upper respiratory specimens during the acute phase of infection. The lowest concentration of SARS-CoV-2 viral copies this assay can detect is 138 copies/mL. A negative result does not preclude SARS-Cov-2 infection and should not be used as the sole basis for treatment or other patient management decisions. A negative result may occur with  improper specimen collection/handling, submission of specimen other than nasopharyngeal swab, presence of viral mutation(s) within the areas targeted by this assay, and inadequate number of viral copies(<138 copies/mL). A negative result must be combined with clinical observations, patient history, and epidemiological information. The expected result is Negative.  Fact Sheet for Patients:  EntrepreneurPulse.com.au  Fact Sheet for Healthcare Providers:  IncredibleEmployment.be  This test is no t yet approved or cleared by the [place] FDA and  has been authorized for detection and/or diagnosis of SARS-CoV-2 by FDA under an Emergency Use Authorization (EUA). This EUA will remain  in effect (meaning this test can be used) for the duration of the COVID-19 declaration under Section 564(b)(1) of the Act, 21 U.S.C.section 360bbb-3(b)(1), unless the authorization is terminated  or revoked sooner.       Influenza A by PCR NEGATIVE NEGATIVE Final   Influenza B by PCR NEGATIVE NEGATIVE Final    Comment: (NOTE) The Xpert Xpress SARS-CoV-2/FLU/RSV plus assay is intended as an aid in the diagnosis of influenza from Nasopharyngeal swab specimens and should not be used as a sole basis for  treatment. Nasal washings and aspirates are unacceptable for Xpert Xpress SARS-CoV-2/FLU/RSV testing.  Fact Sheet for Patients: EntrepreneurPulse.com.au  Fact Sheet for Healthcare Providers: IncredibleEmployment.be  This test is not yet approved or cleared by the [place] FDA and has been authorized for detection and/or diagnosis of SARS-CoV-2 by FDA under an Emergency Use Authorization (EUA). This EUA will remain in effect (meaning this test can be used) for the duration of the COVID-19 declaration under Section 564(b)(1) of the Act, 21 U.S.C. section 360bbb-3(b)(1), unless the authorization is terminated or revoked.  Performed at [facility], [place] [address], [place], [place] [address]      Time coordinating  discharge: 35 minutes  SIGNED:   Aline August, MD  [facility] 09/17/2021, 10:11 AM

## 2021-09-17 NOTE — Progress Notes (Signed)
Physical Therapy Treatment Patient Details Name: Gerald [name] MRN: [id number] DOB: 11/25/1945 Today's Date: 09/17/2021   History of Present Illness Pt is a 76 yo male presenting to [facility] [place] ED on 9/8 after an unwitnessed fall. Imaging negative for acute findings but pt note to be retaining urine and now with catheter in place. PMH: anemia, CHF, CKD, GERD, HLD, HTN, LBBB, OA, DM, R-THA, L-THA, L-RCR, dementia.    PT Comments    Pt agreeable to ambulate however upon standing, pt realized his passing gas was actually a little BM so requested going into bathroom.  Pt requiring increased time in bathroom and not finished so requested NT to assist pt for safety end of session.    Recommendations for follow up therapy are one component of a multi-disciplinary discharge planning process, led by the attending physician.  Recommendations may be updated based on patient status, additional functional criteria and insurance authorization.  Follow Up Recommendations  Home health PT     Assistance Recommended at Discharge Intermittent Supervision/Assistance  Patient can return home with the following A little help with walking and/or transfers;A little help with bathing/dressing/bathroom;Assistance with cooking/housework;Assist for transportation;Help with stairs or ramp for entrance   Equipment Recommendations  None recommended by PT    Recommendations for Other Services       Precautions / Restrictions Precautions Precautions: Fall Restrictions Weight Bearing Restrictions: No     Mobility  Bed Mobility Overal bed mobility: Needs Assistance Bed Mobility: Supine to Sit     Supine to sit: Supervision, HOB elevated          Transfers Overall transfer level: Needs assistance Equipment used: Rolling walker (2 wheels) Transfers: Sit to/from Stand Sit to Stand: Min guard, Supervision           General transfer comment: pt assisted with donning shoes (for LLD) sitting EOB, min/guard  for safety, from bed/toilet    Ambulation/Gait Ambulation/Gait assistance: Min guard Gait Distance (Feet): 8 Feet Assistive device: Rolling walker (2 wheels) Gait Pattern/deviations: Trunk flexed, Step-through pattern, Decreased stride length       General Gait Details: cues for posture and position from RW, pt ambulated into bathroom, pt standing and "washing up" at sink for at least 10 minutes (pt declined assist, requested nurse tech take over so left pt with NT in bathroom since pt requesting more time)   Stairs             Wheelchair Mobility    Modified Rankin (Stroke Patients Only)       Balance Overall balance assessment: Needs assistance         Standing balance support: No upper extremity supported, During functional activity Standing balance-Leahy Scale: Good                              Cognition Arousal/Alertness: Awake/alert Behavior During Therapy: WFL for tasks assessed/performed Overall Cognitive Status: History of cognitive impairments - at baseline                                 General Comments: appropriate with answering questions and following cues        Exercises      General Comments        Pertinent Vitals/Pain Pain Assessment Pain Assessment: No/denies pain    Home Living  Prior Function            PT Goals (current goals can now be found in the care plan section) Progress towards PT goals: Progressing toward goals    Frequency    Min 3X/week      PT Plan Current plan remains appropriate    Co-evaluation              AM-PAC PT "6 Clicks" Mobility   Outcome Measure  Help needed turning from your back to your side while in a flat bed without using bedrails?: None Help needed moving from lying on your back to sitting on the side of a flat bed without using bedrails?: A Little Help needed moving to and from a bed to a chair (including a  wheelchair)?: A Little Help needed standing up from a chair using your arms (e.g., wheelchair or bedside chair)?: A Little Help needed to walk in hospital room?: A Little Help needed climbing 3-5 steps with a railing? : A Little 6 Click Score: 19    End of Session   Activity Tolerance: Patient tolerated treatment well Patient left: with nursing/sitter in room (in bathroom with NT present) Nurse Communication: Mobility status PT Visit Diagnosis: Muscle weakness (generalized) (M62.81);Difficulty in walking, not elsewhere classified (R26.2)     Time: 5072-2575 PT Time Calculation (min) (ACUTE ONLY): 31 min  Charges:  $Gait Training: 8-22 mins                     Jannette Spanner PT, DPT Physical Therapist Acute Rehabilitation Services Preferred contact method: Secure Chat Weekend Pager Only: [phone] Office: [place] 09/17/2021, 2:30 PM

## 2021-09-17 NOTE — Plan of Care (Signed)

## 2021-09-19 ENCOUNTER — Telehealth: Payer: Self-pay

## 2021-09-19 DIAGNOSIS — I130 Hypertensive heart and chronic kidney disease with heart failure and stage 1 through stage 4 chronic kidney disease, or unspecified chronic kidney disease: Secondary | ICD-10-CM | POA: Diagnosis not present

## 2021-09-19 DIAGNOSIS — I447 Left bundle-branch block, unspecified: Secondary | ICD-10-CM | POA: Diagnosis not present

## 2021-09-19 DIAGNOSIS — M1990 Unspecified osteoarthritis, unspecified site: Secondary | ICD-10-CM | POA: Diagnosis not present

## 2021-09-19 DIAGNOSIS — N189 Chronic kidney disease, unspecified: Secondary | ICD-10-CM | POA: Diagnosis not present

## 2021-09-19 DIAGNOSIS — F02B3 Dementia in other diseases classified elsewhere, moderate, with mood disturbance: Secondary | ICD-10-CM | POA: Diagnosis not present

## 2021-09-19 DIAGNOSIS — G4720 Circadian rhythm sleep disorder, unspecified type: Secondary | ICD-10-CM | POA: Diagnosis not present

## 2021-09-19 DIAGNOSIS — F02B4 Dementia in other diseases classified elsewhere, moderate, with anxiety: Secondary | ICD-10-CM | POA: Diagnosis not present

## 2021-09-19 DIAGNOSIS — I509 Heart failure, unspecified: Secondary | ICD-10-CM | POA: Diagnosis not present

## 2021-09-19 DIAGNOSIS — G301 Alzheimer's disease with late onset: Secondary | ICD-10-CM | POA: Diagnosis not present

## 2021-09-19 DIAGNOSIS — E1122 Type 2 diabetes mellitus with diabetic chronic kidney disease: Secondary | ICD-10-CM | POA: Diagnosis not present

## 2021-09-19 DIAGNOSIS — M519 Unspecified thoracic, thoracolumbar and lumbosacral intervertebral disc disorder: Secondary | ICD-10-CM | POA: Diagnosis not present

## 2021-09-19 DIAGNOSIS — R296 Repeated falls: Secondary | ICD-10-CM | POA: Diagnosis not present

## 2021-09-19 DIAGNOSIS — Z9181 History of falling: Secondary | ICD-10-CM | POA: Diagnosis not present

## 2021-09-19 DIAGNOSIS — Z87891 Personal history of nicotine dependence: Secondary | ICD-10-CM | POA: Diagnosis not present

## 2021-09-19 DIAGNOSIS — K219 Gastro-esophageal reflux disease without esophagitis: Secondary | ICD-10-CM | POA: Diagnosis not present

## 2021-09-19 DIAGNOSIS — E559 Vitamin D deficiency, unspecified: Secondary | ICD-10-CM | POA: Diagnosis not present

## 2021-09-19 DIAGNOSIS — E785 Hyperlipidemia, unspecified: Secondary | ICD-10-CM | POA: Diagnosis not present

## 2021-09-19 NOTE — Telephone Encounter (Signed)
Spoke with patient's spouse [name] regarding Palliative Care services. She states patient has a appointment today with [facility]. She would like to discuss services with them and call back.

## 2021-09-20 ENCOUNTER — Telehealth: Payer: Self-pay

## 2021-09-20 NOTE — Telephone Encounter (Signed)
Spoke with patient's spouse Vickii Chafe and scheduled a telephonic Palliative Consult for 09/30/21 @ 11 AM.  Consent obtained; updated [employer], Team List and Epic.

## 2021-09-23 DIAGNOSIS — E559 Vitamin D deficiency, unspecified: Secondary | ICD-10-CM | POA: Diagnosis not present

## 2021-09-23 DIAGNOSIS — R296 Repeated falls: Secondary | ICD-10-CM | POA: Diagnosis not present

## 2021-09-23 DIAGNOSIS — M1990 Unspecified osteoarthritis, unspecified site: Secondary | ICD-10-CM | POA: Diagnosis not present

## 2021-09-23 DIAGNOSIS — F02B4 Dementia in other diseases classified elsewhere, moderate, with anxiety: Secondary | ICD-10-CM | POA: Diagnosis not present

## 2021-09-23 DIAGNOSIS — M519 Unspecified thoracic, thoracolumbar and lumbosacral intervertebral disc disorder: Secondary | ICD-10-CM | POA: Diagnosis not present

## 2021-09-23 DIAGNOSIS — Z87891 Personal history of nicotine dependence: Secondary | ICD-10-CM | POA: Diagnosis not present

## 2021-09-23 DIAGNOSIS — K219 Gastro-esophageal reflux disease without esophagitis: Secondary | ICD-10-CM | POA: Diagnosis not present

## 2021-09-23 DIAGNOSIS — E785 Hyperlipidemia, unspecified: Secondary | ICD-10-CM | POA: Diagnosis not present

## 2021-09-23 DIAGNOSIS — Z9181 History of falling: Secondary | ICD-10-CM | POA: Diagnosis not present

## 2021-09-23 DIAGNOSIS — I509 Heart failure, unspecified: Secondary | ICD-10-CM | POA: Diagnosis not present

## 2021-09-23 DIAGNOSIS — F02B3 Dementia in other diseases classified elsewhere, moderate, with mood disturbance: Secondary | ICD-10-CM | POA: Diagnosis not present

## 2021-09-23 DIAGNOSIS — N189 Chronic kidney disease, unspecified: Secondary | ICD-10-CM | POA: Diagnosis not present

## 2021-09-23 DIAGNOSIS — E1122 Type 2 diabetes mellitus with diabetic chronic kidney disease: Secondary | ICD-10-CM | POA: Diagnosis not present

## 2021-09-23 DIAGNOSIS — G4720 Circadian rhythm sleep disorder, unspecified type: Secondary | ICD-10-CM | POA: Diagnosis not present

## 2021-09-23 DIAGNOSIS — I130 Hypertensive heart and chronic kidney disease with heart failure and stage 1 through stage 4 chronic kidney disease, or unspecified chronic kidney disease: Secondary | ICD-10-CM | POA: Diagnosis not present

## 2021-09-23 DIAGNOSIS — I447 Left bundle-branch block, unspecified: Secondary | ICD-10-CM | POA: Diagnosis not present

## 2021-09-23 DIAGNOSIS — G301 Alzheimer's disease with late onset: Secondary | ICD-10-CM | POA: Diagnosis not present

## 2021-09-24 ENCOUNTER — Telehealth: Payer: Self-pay | Admitting: Neurology

## 2021-09-24 DIAGNOSIS — Z9181 History of falling: Secondary | ICD-10-CM | POA: Diagnosis not present

## 2021-09-24 DIAGNOSIS — I509 Heart failure, unspecified: Secondary | ICD-10-CM | POA: Diagnosis not present

## 2021-09-24 DIAGNOSIS — R296 Repeated falls: Secondary | ICD-10-CM | POA: Diagnosis not present

## 2021-09-24 DIAGNOSIS — K219 Gastro-esophageal reflux disease without esophagitis: Secondary | ICD-10-CM | POA: Diagnosis not present

## 2021-09-24 DIAGNOSIS — G4720 Circadian rhythm sleep disorder, unspecified type: Secondary | ICD-10-CM | POA: Diagnosis not present

## 2021-09-24 DIAGNOSIS — I447 Left bundle-branch block, unspecified: Secondary | ICD-10-CM | POA: Diagnosis not present

## 2021-09-24 DIAGNOSIS — M1990 Unspecified osteoarthritis, unspecified site: Secondary | ICD-10-CM | POA: Diagnosis not present

## 2021-09-24 DIAGNOSIS — Z87891 Personal history of nicotine dependence: Secondary | ICD-10-CM | POA: Diagnosis not present

## 2021-09-24 DIAGNOSIS — I130 Hypertensive heart and chronic kidney disease with heart failure and stage 1 through stage 4 chronic kidney disease, or unspecified chronic kidney disease: Secondary | ICD-10-CM | POA: Diagnosis not present

## 2021-09-24 DIAGNOSIS — G301 Alzheimer's disease with late onset: Secondary | ICD-10-CM | POA: Diagnosis not present

## 2021-09-24 DIAGNOSIS — E1122 Type 2 diabetes mellitus with diabetic chronic kidney disease: Secondary | ICD-10-CM | POA: Diagnosis not present

## 2021-09-24 DIAGNOSIS — E559 Vitamin D deficiency, unspecified: Secondary | ICD-10-CM | POA: Diagnosis not present

## 2021-09-24 DIAGNOSIS — M519 Unspecified thoracic, thoracolumbar and lumbosacral intervertebral disc disorder: Secondary | ICD-10-CM | POA: Diagnosis not present

## 2021-09-24 DIAGNOSIS — N189 Chronic kidney disease, unspecified: Secondary | ICD-10-CM | POA: Diagnosis not present

## 2021-09-24 DIAGNOSIS — F02B4 Dementia in other diseases classified elsewhere, moderate, with anxiety: Secondary | ICD-10-CM | POA: Diagnosis not present

## 2021-09-24 DIAGNOSIS — E785 Hyperlipidemia, unspecified: Secondary | ICD-10-CM | POA: Diagnosis not present

## 2021-09-24 DIAGNOSIS — F02B3 Dementia in other diseases classified elsewhere, moderate, with mood disturbance: Secondary | ICD-10-CM | POA: Diagnosis not present

## 2021-09-24 NOTE — Telephone Encounter (Signed)
LVM for VO, will call back to confirm orders received

## 2021-09-24 NOTE — Telephone Encounter (Signed)
Shelda Pal from [facility] called needing VO to resume Speech Therapy for 1x for 8 weeks to address cognition

## 2021-09-25 ENCOUNTER — Encounter: Payer: Self-pay | Admitting: Neurology

## 2021-09-25 ENCOUNTER — Ambulatory Visit: Payer: Medicare Other | Admitting: Neurology

## 2021-09-25 VITALS — BP 154/70 | HR 87 | Ht 66.0 in | Wt 155.0 lb

## 2021-09-25 DIAGNOSIS — R296 Repeated falls: Secondary | ICD-10-CM | POA: Diagnosis not present

## 2021-09-25 DIAGNOSIS — M1990 Unspecified osteoarthritis, unspecified site: Secondary | ICD-10-CM | POA: Diagnosis not present

## 2021-09-25 DIAGNOSIS — G301 Alzheimer's disease with late onset: Secondary | ICD-10-CM | POA: Diagnosis not present

## 2021-09-25 DIAGNOSIS — M519 Unspecified thoracic, thoracolumbar and lumbosacral intervertebral disc disorder: Secondary | ICD-10-CM | POA: Diagnosis not present

## 2021-09-25 DIAGNOSIS — Z87891 Personal history of nicotine dependence: Secondary | ICD-10-CM | POA: Diagnosis not present

## 2021-09-25 DIAGNOSIS — F02B4 Dementia in other diseases classified elsewhere, moderate, with anxiety: Secondary | ICD-10-CM | POA: Diagnosis not present

## 2021-09-25 DIAGNOSIS — R269 Unspecified abnormalities of gait and mobility: Secondary | ICD-10-CM

## 2021-09-25 DIAGNOSIS — G4720 Circadian rhythm sleep disorder, unspecified type: Secondary | ICD-10-CM | POA: Diagnosis not present

## 2021-09-25 DIAGNOSIS — N189 Chronic kidney disease, unspecified: Secondary | ICD-10-CM | POA: Diagnosis not present

## 2021-09-25 DIAGNOSIS — I130 Hypertensive heart and chronic kidney disease with heart failure and stage 1 through stage 4 chronic kidney disease, or unspecified chronic kidney disease: Secondary | ICD-10-CM | POA: Diagnosis not present

## 2021-09-25 DIAGNOSIS — I509 Heart failure, unspecified: Secondary | ICD-10-CM | POA: Diagnosis not present

## 2021-09-25 DIAGNOSIS — F02B18 Dementia in other diseases classified elsewhere, moderate, with other behavioral disturbance: Secondary | ICD-10-CM | POA: Diagnosis not present

## 2021-09-25 DIAGNOSIS — I447 Left bundle-branch block, unspecified: Secondary | ICD-10-CM | POA: Diagnosis not present

## 2021-09-25 DIAGNOSIS — E119 Type 2 diabetes mellitus without complications: Secondary | ICD-10-CM | POA: Diagnosis not present

## 2021-09-25 DIAGNOSIS — E538 Deficiency of other specified B group vitamins: Secondary | ICD-10-CM | POA: Diagnosis not present

## 2021-09-25 DIAGNOSIS — E559 Vitamin D deficiency, unspecified: Secondary | ICD-10-CM | POA: Diagnosis not present

## 2021-09-25 DIAGNOSIS — E1122 Type 2 diabetes mellitus with diabetic chronic kidney disease: Secondary | ICD-10-CM | POA: Diagnosis not present

## 2021-09-25 DIAGNOSIS — F02B3 Dementia in other diseases classified elsewhere, moderate, with mood disturbance: Secondary | ICD-10-CM | POA: Diagnosis not present

## 2021-09-25 DIAGNOSIS — I129 Hypertensive chronic kidney disease with stage 1 through stage 4 chronic kidney disease, or unspecified chronic kidney disease: Secondary | ICD-10-CM | POA: Diagnosis not present

## 2021-09-25 DIAGNOSIS — E785 Hyperlipidemia, unspecified: Secondary | ICD-10-CM | POA: Diagnosis not present

## 2021-09-25 DIAGNOSIS — N1831 Chronic kidney disease, stage 3a: Secondary | ICD-10-CM | POA: Diagnosis not present

## 2021-09-25 DIAGNOSIS — D631 Anemia in chronic kidney disease: Secondary | ICD-10-CM | POA: Diagnosis not present

## 2021-09-25 DIAGNOSIS — K219 Gastro-esophageal reflux disease without esophagitis: Secondary | ICD-10-CM | POA: Diagnosis not present

## 2021-09-25 DIAGNOSIS — Z9181 History of falling: Secondary | ICD-10-CM | POA: Diagnosis not present
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
## 2021-09-25 NOTE — Patient Instructions (Addendum)
Continue current medications  Follow up with your doctors as scheduled  Follow up as scheduled on November 8  Continue with palliative care

## 2021-09-26 ENCOUNTER — Telehealth: Payer: Self-pay | Admitting: Neurology

## 2021-09-26 DIAGNOSIS — K219 Gastro-esophageal reflux disease without esophagitis: Secondary | ICD-10-CM | POA: Diagnosis not present

## 2021-09-26 DIAGNOSIS — Z9181 History of falling: Secondary | ICD-10-CM | POA: Diagnosis not present

## 2021-09-26 DIAGNOSIS — M519 Unspecified thoracic, thoracolumbar and lumbosacral intervertebral disc disorder: Secondary | ICD-10-CM | POA: Diagnosis not present

## 2021-09-26 DIAGNOSIS — F02B3 Dementia in other diseases classified elsewhere, moderate, with mood disturbance: Secondary | ICD-10-CM | POA: Diagnosis not present

## 2021-09-26 DIAGNOSIS — E785 Hyperlipidemia, unspecified: Secondary | ICD-10-CM | POA: Diagnosis not present

## 2021-09-26 DIAGNOSIS — I447 Left bundle-branch block, unspecified: Secondary | ICD-10-CM | POA: Diagnosis not present

## 2021-09-26 DIAGNOSIS — R296 Repeated falls: Secondary | ICD-10-CM | POA: Diagnosis not present

## 2021-09-26 DIAGNOSIS — G4720 Circadian rhythm sleep disorder, unspecified type: Secondary | ICD-10-CM | POA: Diagnosis not present

## 2021-09-26 DIAGNOSIS — E1122 Type 2 diabetes mellitus with diabetic chronic kidney disease: Secondary | ICD-10-CM | POA: Diagnosis not present

## 2021-09-26 DIAGNOSIS — M1990 Unspecified osteoarthritis, unspecified site: Secondary | ICD-10-CM | POA: Diagnosis not present

## 2021-09-26 DIAGNOSIS — F02B4 Dementia in other diseases classified elsewhere, moderate, with anxiety: Secondary | ICD-10-CM | POA: Diagnosis not present

## 2021-09-26 DIAGNOSIS — N189 Chronic kidney disease, unspecified: Secondary | ICD-10-CM | POA: Diagnosis not present

## 2021-09-26 DIAGNOSIS — I130 Hypertensive heart and chronic kidney disease with heart failure and stage 1 through stage 4 chronic kidney disease, or unspecified chronic kidney disease: Secondary | ICD-10-CM | POA: Diagnosis not present

## 2021-09-26 DIAGNOSIS — E559 Vitamin D deficiency, unspecified: Secondary | ICD-10-CM | POA: Diagnosis not present

## 2021-09-26 DIAGNOSIS — G301 Alzheimer's disease with late onset: Secondary | ICD-10-CM | POA: Diagnosis not present

## 2021-09-26 DIAGNOSIS — I509 Heart failure, unspecified: Secondary | ICD-10-CM | POA: Diagnosis not present

## 2021-09-26 DIAGNOSIS — Z87891 Personal history of nicotine dependence: Secondary | ICD-10-CM | POA: Diagnosis not present

## 2021-09-26 NOTE — Telephone Encounter (Signed)
I called and provided patient's name/DOB and verbal orders for PT and home health on Cecilia's VM.

## 2021-09-26 NOTE — Telephone Encounter (Signed)
Lorna Few is calling. Requesting Pt order for once a week for five weeks, being next week and also home health aide for the same period of time. Please call for verbal orders [phone].

## 2021-09-30 ENCOUNTER — Encounter (INDEPENDENT_AMBULATORY_CARE_PROVIDER_SITE_OTHER): Payer: Medicare Other | Admitting: Ophthalmology

## 2021-09-30 ENCOUNTER — Ambulatory Visit: Payer: Medicare Other | Admitting: Internal Medicine

## 2021-09-30 DIAGNOSIS — F02818 Dementia in other diseases classified elsewhere, unspecified severity, with other behavioral disturbance: Secondary | ICD-10-CM | POA: Diagnosis not present

## 2021-09-30 DIAGNOSIS — Z515 Encounter for palliative care: Secondary | ICD-10-CM | POA: Diagnosis not present

## 2021-09-30 DIAGNOSIS — N179 Acute kidney failure, unspecified: Secondary | ICD-10-CM

## 2021-09-30 DIAGNOSIS — R338 Other retention of urine: Secondary | ICD-10-CM

## 2021-09-30 DIAGNOSIS — G309 Alzheimer's disease, unspecified: Secondary | ICD-10-CM

## 2021-09-30 NOTE — Progress Notes (Signed)
[facility] Consult Note Telephone: [phone]  Fax: [phone]   Date of encounter: 09/30/21 11:02 AM PATIENT NAME: Gerald [name] [address] [place] [address]   [phone] (home)  DOB: 03-Feb-1945 MRN: [id number] PRIMARY CARE PROVIDER:    [name], DO,  [address] [place] [address] [phone]  REFERRING PROVIDER:   [name], [place],  [place] [address] [phone]  RESPONSIBLE PARTY:    Contact Information     Name Relation Home Work Mobile   [place] Spouse [phone]  [place] Daughter   [phone]      Due to the COVID-19 crisis, this visit was done via telemedicine from my office and it was initiated and consent by this patient and or family.  I connected with  [name] OR PROXY on 09/30/21 by a telemedicine application and verified that I am speaking with the correct person using two identifiers.  This patient did not have the ability to connect via a video-enabled capacity.   I discussed the limitations of evaluation and management by telemedicine. The patient expressed understanding and agreed to proceed.  Palliative Care was asked to follow this patient by consultation request of  [name], DO to address advance care planning and complex medical decision making. This is the initial visit.                                     ASSESSMENT AND PLAN / RECOMMENDATIONS:   Advance Care Planning/Goals of Care: Goals include to maximize quality of life and symptom management. Patient/health care surrogate gave his/her permission to discuss.Our advance care planning conversation included a discussion about:    The value and importance of advance care planning  Experiences with loved ones who have been seriously ill or have died  Exploration of personal, cultural or spiritual beliefs that might influence  medical decisions  Exploration of goals of care in the event of a sudden injury or illness  Identification  of a healthcare agent  Review and updating or creation of an  advance directive document . Decision not to resuscitate or to de-escalate disease focused treatments due to poor prognosis. CODE STATUS:  DNR determined in the hospital Children would be decision-makers rather than Rivan's sister in [place] so they want to redo the [facility] document.  Copy will be sent to them of LW, HCPOA and MOST to Gerald [name]'s email.  Will need MOST completed with primary   Symptom Management/Plan: 1. Alzheimer's dementia with behavioral disturbance [facility]) -caregiver education and support provided, forms for HCPOA and living will and MOST sent to Gerald [name]'s email  2. AKI (acute kidney injury) ([place]) -resolved with resolution of #3 and hydration  3. Acute urinary retention -resolved with catheter, f/u urology  4. Palliative care by specialist -reviewed advance directives, role of palliative and sent info requested   This visit was coded based on medical decision making (MDM).  PPS: 60%  HOSPICE ELIGIBILITY/DIAGNOSIS: not yet/alzheimer's  Chief Complaint: initial virtual palliative consult  HISTORY OF PRESENT ILLNESS:  Gerald [name] is a 76 y.o. year old male  with moderate AD with beheavior, htn, chronic diastolic chf, CKD3 and recent hosptialization with delirium secondary to acute renal failure with cr 2.61 down to 1.77, urianry retention with coude cath placed and needs urology f/u and on flomax.  Getting [facility] pt, ot, st and aide and social worker were also ordered at hospital d/c.  Pt had increased behavior, confusion, agitation and falls at home.  Fluid weight gain prior to admission.  His son lives in [place], daughter is local and granddaughter lives with them .   [name] is doing much better now.    He had acute delirium due to acute renal failure from urinary retention.  He has a  urology f/u Wed and hopefully catheter will be removed.    He had edema in his ankles last week, but not now.  That was last thursday.  He weighed 156 lbs.  He has a f/u with Dr. Theda Sers on Thursday.    He's fallen at home previously, but not since he returned from the hospital.  Gerald [name] did come to visit with him--getting PT--last seen last Thursday.    Gerald [name] depends on someone to fix his food, making the bed, and with trimming his beard and hair.  Keeps a beard.  He bathes and dresses himself.  Catheter has interfered a little bit.  He uses a walker to get around.  Appetite is excellent--says he eats too much.  Gerald [name] had a hip replacement and her mother was cooking and fixing his plate and bringing it to him.  Gerald [name] did not bring him his meals--sometimes he would miss a meal.  Vickii Chafe says she doesn't have the energy to stay up to get him to eat late at night.  He does not drive anymore--since august.  Neurologist suggested he stop driving after he got confused in the neighborhood.  He actually lost his keys.  He will get dropped off at the grocery and shop there.    Vickii Chafe would like to return to work at least 4 hours per day.  May need someone to check on him.  He does sleep most of the day and stays up at night.  He wandered outside in the middle of night when he used to drive.  She is to start back 12/13 if she does well with her therapy and whether she'll go back full or part time.    History obtained from review of EMR, discussion with primary team, and interview with family, facility staff/caregiver and/or Mr. Jellison.   I reviewed available labs, medications, imaging, studies and related documents from the EMR.  Records reviewed and summarized above.   ROS  Review of Systemssee hpi  Physical Exam:  Wt Readings from Last 500 Encounters:  09/25/21 155 lb (70.3 kg)  09/13/21 155 lb (70.3 kg)  07/25/21 155 lb (70.3 kg)  07/24/21 154 lb (69.9 kg)  07/03/21 154 lb (69.9 kg)  04/01/21  168 lb (76.2 kg)  02/08/15 216 lb (98 kg)  10/05/14 196 lb (88.9 kg)  07/15/13 200 lb (90.7 kg)  06/24/13 205 lb 9.6 oz (93.3 kg)  04/28/13 204 lb 3.2 oz (92.6 kg)  03/29/13 200 lb (90.7 kg)  02/17/13 205 lb (93 kg)  12/20/12 206 lb 6.4 oz (93.6 kg)  06/01/12 207 lb 1.6 oz (93.9 kg)  04/29/12 209 lb (94.8 kg)  05/15/11 197 lb 3.2 oz (89.4 kg)  12/12/10 190 lb 4 oz (86.3 kg)  10/14/10 188 lb (85.3 kg)  08/27/10 185 lb (83.9 kg)  08/09/10 178 lb 12.8 oz (81.1 kg)  06/25/10 174 lb 8 oz (79.2 kg)  05/14/10 173 lb (78.5 kg)  01/11/10 204 lb (92.5 kg)   Physical Exam Constitutional:      Appearance:  Normal appearance.  HENT:     Head: Normocephalic and atraumatic.  Pulmonary:     Effort: Pulmonary effort is normal.  Neurological:     Mental Status: He is alert.     Motor: Weakness present.  Psychiatric:        Mood and Affect: Mood normal.     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   Posterior vitreous detachment of both eyes 12/24/2020   Anemia of chronic disease 11/07/2020   Recurrent falls 11/07/2020   Pseudophakia of both eyes 10/16/2020   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 ([place]) 10/16/2020   Macular pucker, right eye 10/16/2020   Mild nonproliferative diabetic retinopathy of left eye ([place]) 10/16/2020   Anxiety state 01/04/2020   Benign prostatic hyperplasia without lower urinary tract symptoms 01/04/2020   Chronic diastolic  heart failure ([place]) 01/04/2020   Diabetic renal disease ([place]) 01/04/2020   Diabetic retinopathy ([place]) 01/04/2020   ED (erectile dysfunction) of organic origin 01/04/2020   Hypertrophic cardiomyopathy ([place]) 01/04/2020   Insomnia 01/04/2020   Left bundle branch block 01/04/2020   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   Primary localized osteoarthritis of pelvic region and thigh 01/04/2020   Stage 3 chronic kidney disease ([place]) 01/04/2020   Vitamin B12 deficiency (non anemic) 01/04/2020   Recurrent major depression ([place]) 01/04/2020   Severe episode of recurrent major depressive disorder, without psychotic features ([place]) 10/05/2018   Cognitive complaints with normal neuropsychological exam 03/11/2018   Referred otalgia of left ear 09/23/2016   Temporomandibular joint (TMJ) pain 09/23/2016   Cervicogenic headache 09/03/2016   Obstructive sleep apnea syndrome 11/15/2015   Memory difficulties 10/05/2014   Abnormality of gait 10/05/2014   Intermittent explosive disorder 09/25/2014   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   Encounter for long-term (current) use of other medications 04/28/2013   Vitamin D Deficiency 04/28/2013   Sleep disorder -Epworth OSA scale = 15 06/02/2012   Anal fissure 08/09/2010   Septic arthritis of hip ([place]) 04/11/2010   HYPERLIPIDEMIA 01/11/2010   DEPRESSION 01/11/2010   Hypertension 01/11/2010   GERD 01/11/2010   Depression 01/11/2010   Hyperlipidemia 01/11/2010   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 ([place]) 09/21/2008   ANEMIA, IRON DEFICIENCY 09/21/2008   Personal history of colonic polyps 09/21/2008   Anemia, iron deficiency 09/21/2008   PAST MEDICAL HISTORY:  Active Ambulatory Problems    Diagnosis Date Noted   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 ([place]) 09/21/2008   ANEMIA, IRON DEFICIENCY 09/21/2008   Personal history of colonic polyps 09/21/2008   HYPERLIPIDEMIA  01/11/2010   DEPRESSION 01/11/2010   Hypertension 01/11/2010   GERD 01/11/2010   Septic arthritis of hip ([place]) 04/11/2010   Anal fissure 08/09/2010   Sleep disorder -Epworth OSA scale = 15 06/02/2012   Encounter for long-term (current) use of other medications 04/28/2013   Vitamin D Deficiency 04/28/2013   Intermittent explosive disorder 09/25/2014  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   Memory difficulties 10/05/2014   Abnormality of gait 10/05/2014   Cervicogenic headache 09/03/2016   Cognitive complaints with normal neuropsychological exam 03/11/2018   Obstructive sleep apnea syndrome 11/15/2015   Referred otalgia of left ear 09/23/2016   Severe episode of recurrent major depressive disorder, without psychotic features ([place]) 10/05/2018   Temporomandibular joint (TMJ) pain 09/23/2016   Depression 01/11/2010   Anemia, iron deficiency 09/21/2008   Hyperlipidemia 01/11/2010   Anxiety state 01/04/2020   Benign prostatic hyperplasia without lower urinary tract symptoms 01/04/2020   Chronic diastolic heart failure ([place]) 01/04/2020   Diabetic renal disease ([place]) 01/04/2020   Diabetic retinopathy ([place]) 01/04/2020   ED (erectile dysfunction) of organic origin 01/04/2020   Hypertrophic cardiomyopathy ([place]) 01/04/2020   Insomnia 01/04/2020   Left bundle branch block 01/04/2020   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   Primary localized osteoarthritis of pelvic region and thigh 01/04/2020   Stage 3 chronic kidney disease ([place]) 01/04/2020   Vitamin B12 deficiency (non anemic) 01/04/2020   Recurrent major depression ([place]) 01/04/2020   Pseudophakia of both eyes 10/16/2020   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 ([place]) 10/16/2020   Macular pucker, right eye 10/16/2020   Mild nonproliferative diabetic retinopathy of left eye ([place]) 10/16/2020   Anemia of chronic disease 11/07/2020    Recurrent falls 11/07/2020   Posterior vitreous detachment of both eyes 12/24/2020   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   Anemia of chronic renal failure 06/26/2021   Acute encephalopathy 09/13/2021   Alzheimer's dementia with behavioral disturbance ([place]) 09/14/2021   Prolonged QT interval 09/14/2021   AKI (acute kidney injury) ([place])    Acute renal failure superimposed on stage 3 chronic kidney disease ([place])    Acute metabolic encephalopathy 01/08/7251   Acute urinary retention 09/16/2021   Resolved Ambulatory Problems    Diagnosis Date Noted   Type 2 diabetes mellitus without complications ([place]) 66/44/0347   Past Medical History:  Diagnosis Date   Anemia    Anxiety    Bilateral edema of lower extremity    Blood transfusion without reported diagnosis    Cataract    CHF (congestive heart failure) ([name])    Chronic kidney disease    Colon polyp    DDD (degenerative disc disease)    DJD (degenerative joint disease)    DOE (dyspnea on exertion)    Fatigue    Fatigue    GERD (gastroesophageal reflux disease)    H/O iron deficiency anemia    Osteoarthritis    Scrotal edema    Vitamin D deficiency    SOCIAL HX:  Social History   Tobacco Use   Smoking status: Former    Packs/day: 0.50    Years: 30.00    Total pack years: 15.00    Types: Cigarettes    Quit date: 03/29/1973    Years since quitting: 48.5   Smokeless tobacco: Never  Substance Use Topics   Alcohol use: Yes    Alcohol/week: 1.0 standard drink of alcohol    Types: 1 Standard drinks or equivalent per week    Comment: couple times a year   FAMILY HX:  Family History  Problem Relation Age of Onset   Dementia Mother    Hypertension Mother    Heart disease Mother    Diabetes Mother    Heart attack Maternal Grandmother    Diabetes Maternal  Grandfather    Colon cancer Neg Hx    Colon polyps Neg Hx    Esophageal cancer Neg Hx    Rectal cancer Neg Hx    Stomach cancer Neg Hx        ALLERGIES: No Known Allergies    PERTINENT MEDICATIONS:  Outpatient Encounter Medications as of 09/30/2021  Medication Sig   amLODipine (NORVASC) 10 MG tablet Take 10 mg by mouth in the morning.   atorvastatin (LIPITOR) 10 MG tablet Take 10 mg by mouth at bedtime.   buPROPion (WELLBUTRIN XL) 150 MG 24 hr tablet TAKE 1 TABLET (150 MG TOTAL) BY MOUTH EVERY MORNING. (Patient taking differently: Take 450 mg by mouth in the morning.)   doxazosin (CARDURA) 8 MG tablet Take 1 tablet (8 mg total) by mouth at bedtime.   ferrous sulfate 325 (65 FE) MG tablet Take 325 mg by mouth daily with breakfast.   FLUoxetine (PROZAC) 40 MG capsule Take 1 capsule (40 mg total) by mouth daily. (Patient taking differently: Take 40 mg by mouth in the morning.)   Melatonin 5 MG SUBL Place 5 mg under the tongue at bedtime as needed (sleep).    memantine (NAMENDA) 10 MG tablet Take 1 tablet (10 mg total) by mouth 2 (two) times daily.   Multiple Vitamins-Minerals (CENTRUM SILVER 50+MEN) TABS Take 1 tablet by mouth daily with breakfast.   omeprazole (PRILOSEC) 40 MG capsule Take 1 capsule (40 mg total) by mouth daily. (Patient taking differently: Take 40 mg by mouth See admin instructions. Take 40 mg by mouth mid-morning every day)   polyethylene glycol (MIRALAX / GLYCOLAX) 17 g packet Take 17 g by mouth daily as needed for moderate constipation.   QUEtiapine (SEROQUEL) 25 MG tablet Take 0.5 tablets (12.5 mg total) by mouth 2 (two) times daily.   rivastigmine (EXELON) 3 MG capsule Take 1 capsule (3 mg total) by mouth 2 (two) times daily.   senna-docusate (SENOKOT-S) 8.6-50 MG tablet Take 1 tablet by mouth 2 (two) times daily.   No facility-administered encounter medications on file as of 09/30/2021.    Thank you for the opportunity to participate in the care of Mr. Maisel.  The palliative care team will continue to follow. Please call our office at [phone] if we can be of additional assistance.   Hollace Kinnier,  DO  COVID-19 PATIENT SCREENING TOOL Asked and negative response unless otherwise noted:  Have you had symptoms of covid, tested positive or been in contact with someone with symptoms/positive test in the past 5-10 days?  NO

## 2021-10-01 ENCOUNTER — Encounter: Payer: Self-pay | Admitting: Podiatry

## 2021-10-01 ENCOUNTER — Ambulatory Visit: Payer: Medicare Other | Admitting: Podiatry

## 2021-10-01 DIAGNOSIS — E1122 Type 2 diabetes mellitus with diabetic chronic kidney disease: Secondary | ICD-10-CM

## 2021-10-01 DIAGNOSIS — B351 Tinea unguium: Secondary | ICD-10-CM

## 2021-10-01 DIAGNOSIS — M79674 Pain in right toe(s): Secondary | ICD-10-CM

## 2021-10-01 DIAGNOSIS — M79675 Pain in left toe(s): Secondary | ICD-10-CM

## 2021-10-01 DIAGNOSIS — N183 Chronic kidney disease, stage 3 unspecified: Secondary | ICD-10-CM | POA: Diagnosis not present

## 2021-10-01 DIAGNOSIS — L84 Corns and callosities: Secondary | ICD-10-CM | POA: Diagnosis not present

## 2021-10-02 DIAGNOSIS — F02B3 Dementia in other diseases classified elsewhere, moderate, with mood disturbance: Secondary | ICD-10-CM | POA: Diagnosis not present

## 2021-10-02 DIAGNOSIS — I5032 Chronic diastolic (congestive) heart failure: Secondary | ICD-10-CM | POA: Diagnosis not present

## 2021-10-02 DIAGNOSIS — E785 Hyperlipidemia, unspecified: Secondary | ICD-10-CM | POA: Diagnosis not present

## 2021-10-02 DIAGNOSIS — G4720 Circadian rhythm sleep disorder, unspecified type: Secondary | ICD-10-CM | POA: Diagnosis not present

## 2021-10-02 DIAGNOSIS — I447 Left bundle-branch block, unspecified: Secondary | ICD-10-CM | POA: Diagnosis not present

## 2021-10-02 DIAGNOSIS — E559 Vitamin D deficiency, unspecified: Secondary | ICD-10-CM | POA: Diagnosis not present

## 2021-10-02 DIAGNOSIS — E1122 Type 2 diabetes mellitus with diabetic chronic kidney disease: Secondary | ICD-10-CM | POA: Diagnosis not present

## 2021-10-02 DIAGNOSIS — N183 Chronic kidney disease, stage 3 unspecified: Secondary | ICD-10-CM | POA: Diagnosis not present

## 2021-10-02 DIAGNOSIS — Z9181 History of falling: Secondary | ICD-10-CM | POA: Diagnosis not present

## 2021-10-02 DIAGNOSIS — R338 Other retention of urine: Secondary | ICD-10-CM | POA: Diagnosis not present

## 2021-10-02 DIAGNOSIS — R339 Retention of urine, unspecified: Secondary | ICD-10-CM | POA: Diagnosis not present

## 2021-10-02 DIAGNOSIS — D631 Anemia in chronic kidney disease: Secondary | ICD-10-CM | POA: Diagnosis not present

## 2021-10-02 DIAGNOSIS — Z466 Encounter for fitting and adjustment of urinary device: Secondary | ICD-10-CM | POA: Diagnosis not present

## 2021-10-02 DIAGNOSIS — M1990 Unspecified osteoarthritis, unspecified site: Secondary | ICD-10-CM | POA: Diagnosis not present

## 2021-10-02 DIAGNOSIS — G301 Alzheimer's disease with late onset: Secondary | ICD-10-CM | POA: Diagnosis not present

## 2021-10-02 DIAGNOSIS — I130 Hypertensive heart and chronic kidney disease with heart failure and stage 1 through stage 4 chronic kidney disease, or unspecified chronic kidney disease: Secondary | ICD-10-CM | POA: Diagnosis not present

## 2021-10-02 DIAGNOSIS — K219 Gastro-esophageal reflux disease without esophagitis: Secondary | ICD-10-CM | POA: Diagnosis not present

## 2021-10-02 DIAGNOSIS — R296 Repeated falls: Secondary | ICD-10-CM | POA: Diagnosis not present

## 2021-10-02 DIAGNOSIS — Z87891 Personal history of nicotine dependence: Secondary | ICD-10-CM | POA: Diagnosis not present

## 2021-10-02 DIAGNOSIS — M519 Unspecified thoracic, thoracolumbar and lumbosacral intervertebral disc disorder: Secondary | ICD-10-CM | POA: Diagnosis not present

## 2021-10-02 DIAGNOSIS — F02B4 Dementia in other diseases classified elsewhere, moderate, with anxiety: Secondary | ICD-10-CM | POA: Diagnosis not present

## 2021-10-02 DIAGNOSIS — G4733 Obstructive sleep apnea (adult) (pediatric): Secondary | ICD-10-CM | POA: Diagnosis not present

## 2021-10-03 DIAGNOSIS — F02B4 Dementia in other diseases classified elsewhere, moderate, with anxiety: Secondary | ICD-10-CM | POA: Diagnosis not present

## 2021-10-03 DIAGNOSIS — I130 Hypertensive heart and chronic kidney disease with heart failure and stage 1 through stage 4 chronic kidney disease, or unspecified chronic kidney disease: Secondary | ICD-10-CM | POA: Diagnosis not present

## 2021-10-03 DIAGNOSIS — I447 Left bundle-branch block, unspecified: Secondary | ICD-10-CM | POA: Diagnosis not present

## 2021-10-03 DIAGNOSIS — N1831 Chronic kidney disease, stage 3a: Secondary | ICD-10-CM | POA: Diagnosis not present

## 2021-10-03 DIAGNOSIS — Z87891 Personal history of nicotine dependence: Secondary | ICD-10-CM | POA: Diagnosis not present

## 2021-10-03 DIAGNOSIS — K219 Gastro-esophageal reflux disease without esophagitis: Secondary | ICD-10-CM | POA: Diagnosis not present

## 2021-10-03 DIAGNOSIS — G4733 Obstructive sleep apnea (adult) (pediatric): Secondary | ICD-10-CM | POA: Diagnosis not present

## 2021-10-03 DIAGNOSIS — M519 Unspecified thoracic, thoracolumbar and lumbosacral intervertebral disc disorder: Secondary | ICD-10-CM | POA: Diagnosis not present

## 2021-10-03 DIAGNOSIS — G4720 Circadian rhythm sleep disorder, unspecified type: Secondary | ICD-10-CM | POA: Diagnosis not present

## 2021-10-03 DIAGNOSIS — D631 Anemia in chronic kidney disease: Secondary | ICD-10-CM | POA: Diagnosis not present

## 2021-10-03 DIAGNOSIS — R269 Unspecified abnormalities of gait and mobility: Secondary | ICD-10-CM | POA: Diagnosis not present

## 2021-10-03 DIAGNOSIS — F02B3 Dementia in other diseases classified elsewhere, moderate, with mood disturbance: Secondary | ICD-10-CM | POA: Diagnosis not present

## 2021-10-03 DIAGNOSIS — M1990 Unspecified osteoarthritis, unspecified site: Secondary | ICD-10-CM | POA: Diagnosis not present

## 2021-10-03 DIAGNOSIS — I129 Hypertensive chronic kidney disease with stage 1 through stage 4 chronic kidney disease, or unspecified chronic kidney disease: Secondary | ICD-10-CM | POA: Diagnosis not present

## 2021-10-03 DIAGNOSIS — E119 Type 2 diabetes mellitus without complications: Secondary | ICD-10-CM | POA: Diagnosis not present

## 2021-10-03 DIAGNOSIS — N183 Chronic kidney disease, stage 3 unspecified: Secondary | ICD-10-CM | POA: Diagnosis not present

## 2021-10-03 DIAGNOSIS — R296 Repeated falls: Secondary | ICD-10-CM | POA: Diagnosis not present

## 2021-10-03 DIAGNOSIS — F0283 Dementia in other diseases classified elsewhere, unspecified severity, with mood disturbance: Secondary | ICD-10-CM | POA: Diagnosis not present

## 2021-10-03 DIAGNOSIS — I5032 Chronic diastolic (congestive) heart failure: Secondary | ICD-10-CM | POA: Diagnosis not present

## 2021-10-03 DIAGNOSIS — Z9181 History of falling: Secondary | ICD-10-CM | POA: Diagnosis not present

## 2021-10-03 DIAGNOSIS — Z466 Encounter for fitting and adjustment of urinary device: Secondary | ICD-10-CM | POA: Diagnosis not present

## 2021-10-03 DIAGNOSIS — R339 Retention of urine, unspecified: Secondary | ICD-10-CM | POA: Diagnosis not present

## 2021-10-03 DIAGNOSIS — E1122 Type 2 diabetes mellitus with diabetic chronic kidney disease: Secondary | ICD-10-CM | POA: Diagnosis not present

## 2021-10-03 DIAGNOSIS — Z23 Encounter for immunization: Secondary | ICD-10-CM | POA: Diagnosis not present

## 2021-10-03 DIAGNOSIS — E785 Hyperlipidemia, unspecified: Secondary | ICD-10-CM | POA: Diagnosis not present

## 2021-10-03 DIAGNOSIS — E559 Vitamin D deficiency, unspecified: Secondary | ICD-10-CM | POA: Diagnosis not present

## 2021-10-03 DIAGNOSIS — Z9289 Personal history of other medical treatment: Secondary | ICD-10-CM | POA: Diagnosis not present

## 2021-10-03 DIAGNOSIS — G301 Alzheimer's disease with late onset: Secondary | ICD-10-CM | POA: Diagnosis not present

## 2021-10-04 DIAGNOSIS — R338 Other retention of urine: Secondary | ICD-10-CM | POA: Diagnosis not present

## 2021-10-05 NOTE — Progress Notes (Signed)
  Subjective:  Patient ID: Gerald [name], male    DOB: 1945/11/07,  MRN: [id number]  Gerald [name] presents to clinic today for:  Chief Complaint  Patient presents with   Nail Problem    Diabetic foot care BS-did not check today A1C-do not know Gerald [name] PCP VST-  "I may not be coming back."  New problem(s): None. He is utilizing a Foley bag on today's visit.  PCP is [name], DO , and last visit was  September 25, 2021.  No Known Allergies  Review of Systems: Negative except as noted in the HPI.  Objective: No changes noted in today's physical examination.  Gerald [name] is a pleasant 76 y.o. male WD, WN in NAD. AAO x 3. Vascular Examination: CFT <3 seconds b/l LE. Faintly palpable DP pulse(s) RLE. Faintly palpable PT pulse(s) b/l LE. Diminished DP pulse(s) left lower extremity. Pedal hair absent. No pain with calf compression b/l. Lower extremity skin temperature gradient within normal limits. No edema noted b/l LE. No cyanosis or clubbing noted b/l LE.  Dermatological Examination: Pedal integument with normal turgor, texture and tone b/l LE. No open wounds b/l. No interdigital macerations b/l. Toenails 1-5 b/l elongated, thickened, discolored with subungual debris. +Tenderness with dorsal palpation of nailplates. Hyperkeratotic lesion(s) submet head 5 b/l.  Musculoskeletal Examination: Muscle strength 5/5 to all lower extremity muscle groups bilaterally. Limb length discrepancy of left lower extremity. External build up on left shoe. Hammertoe deformity noted 2-5 b/l.  Neurological Examination: Protective sensation intact 5/5 intact bilaterally with 10g monofilament b/l. Vibratory sensation intact b/l.  Assessment/Plan: 1. Pain due to onychomycosis of toenails of both feet   2. Callus   3. Type 2 diabetes mellitus with stage 3 chronic kidney disease, without long-term current use of insulin, unspecified whether stage 3a or 3b CKD ([place])     No orders of  the defined types were placed in this encounter.   -Patient was evaluated and treated. All patient's and/or POA's questions/concerns answered on today's visit. -Continue foot and shoe inspections daily. Monitor blood glucose per PCP/Endocrinologist's recommendations. -Toenails 1-5 b/l were debrided in length and girth with sterile nail nippers and dremel without iatrogenic bleeding.  -Callus(es) submet head 5 b/l pared utilizing sharp debridement with sterile blade without complication or incident. Total number debrided =2. -Patient/POA to call should there be question/concern in the interim.   Return in about 3 months (around 12/31/2021).  Marzetta Board, DPM

## 2021-10-07 ENCOUNTER — Encounter (INDEPENDENT_AMBULATORY_CARE_PROVIDER_SITE_OTHER): Payer: Medicare Other | Admitting: Ophthalmology

## 2021-10-07 DIAGNOSIS — E785 Hyperlipidemia, unspecified: Secondary | ICD-10-CM | POA: Diagnosis not present

## 2021-10-07 DIAGNOSIS — G4733 Obstructive sleep apnea (adult) (pediatric): Secondary | ICD-10-CM | POA: Diagnosis not present

## 2021-10-07 DIAGNOSIS — F02B3 Dementia in other diseases classified elsewhere, moderate, with mood disturbance: Secondary | ICD-10-CM | POA: Diagnosis not present

## 2021-10-07 DIAGNOSIS — E559 Vitamin D deficiency, unspecified: Secondary | ICD-10-CM | POA: Diagnosis not present

## 2021-10-07 DIAGNOSIS — F02B4 Dementia in other diseases classified elsewhere, moderate, with anxiety: Secondary | ICD-10-CM | POA: Diagnosis not present

## 2021-10-07 DIAGNOSIS — N183 Chronic kidney disease, stage 3 unspecified: Secondary | ICD-10-CM | POA: Diagnosis not present

## 2021-10-07 DIAGNOSIS — R296 Repeated falls: Secondary | ICD-10-CM | POA: Diagnosis not present

## 2021-10-07 DIAGNOSIS — I5032 Chronic diastolic (congestive) heart failure: Secondary | ICD-10-CM | POA: Diagnosis not present

## 2021-10-07 DIAGNOSIS — Z9181 History of falling: Secondary | ICD-10-CM | POA: Diagnosis not present

## 2021-10-07 DIAGNOSIS — G4720 Circadian rhythm sleep disorder, unspecified type: Secondary | ICD-10-CM | POA: Diagnosis not present

## 2021-10-07 DIAGNOSIS — D631 Anemia in chronic kidney disease: Secondary | ICD-10-CM | POA: Diagnosis not present

## 2021-10-07 DIAGNOSIS — G301 Alzheimer's disease with late onset: Secondary | ICD-10-CM | POA: Diagnosis not present

## 2021-10-07 DIAGNOSIS — I130 Hypertensive heart and chronic kidney disease with heart failure and stage 1 through stage 4 chronic kidney disease, or unspecified chronic kidney disease: Secondary | ICD-10-CM | POA: Diagnosis not present

## 2021-10-07 DIAGNOSIS — M519 Unspecified thoracic, thoracolumbar and lumbosacral intervertebral disc disorder: Secondary | ICD-10-CM | POA: Diagnosis not present

## 2021-10-07 DIAGNOSIS — Z466 Encounter for fitting and adjustment of urinary device: Secondary | ICD-10-CM | POA: Diagnosis not present

## 2021-10-07 DIAGNOSIS — K219 Gastro-esophageal reflux disease without esophagitis: Secondary | ICD-10-CM | POA: Diagnosis not present

## 2021-10-07 DIAGNOSIS — M1990 Unspecified osteoarthritis, unspecified site: Secondary | ICD-10-CM | POA: Diagnosis not present

## 2021-10-07 DIAGNOSIS — I447 Left bundle-branch block, unspecified: Secondary | ICD-10-CM | POA: Diagnosis not present

## 2021-10-07 DIAGNOSIS — R339 Retention of urine, unspecified: Secondary | ICD-10-CM | POA: Diagnosis not present

## 2021-10-07 DIAGNOSIS — E1122 Type 2 diabetes mellitus with diabetic chronic kidney disease: Secondary | ICD-10-CM | POA: Diagnosis not present

## 2021-10-07 DIAGNOSIS — Z87891 Personal history of nicotine dependence: Secondary | ICD-10-CM | POA: Diagnosis not present

## 2021-10-08 DIAGNOSIS — F02B4 Dementia in other diseases classified elsewhere, moderate, with anxiety: Secondary | ICD-10-CM | POA: Diagnosis not present

## 2021-10-08 DIAGNOSIS — M1990 Unspecified osteoarthritis, unspecified site: Secondary | ICD-10-CM | POA: Diagnosis not present

## 2021-10-08 DIAGNOSIS — I130 Hypertensive heart and chronic kidney disease with heart failure and stage 1 through stage 4 chronic kidney disease, or unspecified chronic kidney disease: Secondary | ICD-10-CM | POA: Diagnosis not present

## 2021-10-08 DIAGNOSIS — F02B3 Dementia in other diseases classified elsewhere, moderate, with mood disturbance: Secondary | ICD-10-CM | POA: Diagnosis not present

## 2021-10-08 DIAGNOSIS — D631 Anemia in chronic kidney disease: Secondary | ICD-10-CM | POA: Diagnosis not present

## 2021-10-08 DIAGNOSIS — I5032 Chronic diastolic (congestive) heart failure: Secondary | ICD-10-CM | POA: Diagnosis not present

## 2021-10-08 DIAGNOSIS — K219 Gastro-esophageal reflux disease without esophagitis: Secondary | ICD-10-CM | POA: Diagnosis not present

## 2021-10-08 DIAGNOSIS — G301 Alzheimer's disease with late onset: Secondary | ICD-10-CM | POA: Diagnosis not present

## 2021-10-08 DIAGNOSIS — Z87891 Personal history of nicotine dependence: Secondary | ICD-10-CM | POA: Diagnosis not present

## 2021-10-08 DIAGNOSIS — G4733 Obstructive sleep apnea (adult) (pediatric): Secondary | ICD-10-CM | POA: Diagnosis not present

## 2021-10-08 DIAGNOSIS — R339 Retention of urine, unspecified: Secondary | ICD-10-CM | POA: Diagnosis not present

## 2021-10-08 DIAGNOSIS — E559 Vitamin D deficiency, unspecified: Secondary | ICD-10-CM | POA: Diagnosis not present

## 2021-10-08 DIAGNOSIS — E785 Hyperlipidemia, unspecified: Secondary | ICD-10-CM | POA: Diagnosis not present

## 2021-10-08 DIAGNOSIS — E1122 Type 2 diabetes mellitus with diabetic chronic kidney disease: Secondary | ICD-10-CM | POA: Diagnosis not present

## 2021-10-08 DIAGNOSIS — Z9181 History of falling: Secondary | ICD-10-CM | POA: Diagnosis not present

## 2021-10-08 DIAGNOSIS — R296 Repeated falls: Secondary | ICD-10-CM | POA: Diagnosis not present

## 2021-10-08 DIAGNOSIS — G4720 Circadian rhythm sleep disorder, unspecified type: Secondary | ICD-10-CM | POA: Diagnosis not present

## 2021-10-08 DIAGNOSIS — N183 Chronic kidney disease, stage 3 unspecified: Secondary | ICD-10-CM | POA: Diagnosis not present

## 2021-10-08 DIAGNOSIS — Z466 Encounter for fitting and adjustment of urinary device: Secondary | ICD-10-CM | POA: Diagnosis not present

## 2021-10-08 DIAGNOSIS — M519 Unspecified thoracic, thoracolumbar and lumbosacral intervertebral disc disorder: Secondary | ICD-10-CM | POA: Diagnosis not present

## 2021-10-08 DIAGNOSIS — I447 Left bundle-branch block, unspecified: Secondary | ICD-10-CM | POA: Diagnosis not present

## 2021-10-14 DIAGNOSIS — K219 Gastro-esophageal reflux disease without esophagitis: Secondary | ICD-10-CM | POA: Diagnosis not present

## 2021-10-14 DIAGNOSIS — R296 Repeated falls: Secondary | ICD-10-CM | POA: Diagnosis not present

## 2021-10-14 DIAGNOSIS — I5032 Chronic diastolic (congestive) heart failure: Secondary | ICD-10-CM | POA: Diagnosis not present

## 2021-10-14 DIAGNOSIS — M1990 Unspecified osteoarthritis, unspecified site: Secondary | ICD-10-CM | POA: Diagnosis not present

## 2021-10-14 DIAGNOSIS — I130 Hypertensive heart and chronic kidney disease with heart failure and stage 1 through stage 4 chronic kidney disease, or unspecified chronic kidney disease: Secondary | ICD-10-CM | POA: Diagnosis not present

## 2021-10-14 DIAGNOSIS — R339 Retention of urine, unspecified: Secondary | ICD-10-CM | POA: Diagnosis not present

## 2021-10-14 DIAGNOSIS — E559 Vitamin D deficiency, unspecified: Secondary | ICD-10-CM | POA: Diagnosis not present

## 2021-10-14 DIAGNOSIS — Z9181 History of falling: Secondary | ICD-10-CM | POA: Diagnosis not present

## 2021-10-14 DIAGNOSIS — E785 Hyperlipidemia, unspecified: Secondary | ICD-10-CM | POA: Diagnosis not present

## 2021-10-14 DIAGNOSIS — I447 Left bundle-branch block, unspecified: Secondary | ICD-10-CM | POA: Diagnosis not present

## 2021-10-14 DIAGNOSIS — G4720 Circadian rhythm sleep disorder, unspecified type: Secondary | ICD-10-CM | POA: Diagnosis not present

## 2021-10-14 DIAGNOSIS — G301 Alzheimer's disease with late onset: Secondary | ICD-10-CM | POA: Diagnosis not present

## 2021-10-14 DIAGNOSIS — D631 Anemia in chronic kidney disease: Secondary | ICD-10-CM | POA: Diagnosis not present

## 2021-10-14 DIAGNOSIS — F02B3 Dementia in other diseases classified elsewhere, moderate, with mood disturbance: Secondary | ICD-10-CM | POA: Diagnosis not present

## 2021-10-14 DIAGNOSIS — Z87891 Personal history of nicotine dependence: Secondary | ICD-10-CM | POA: Diagnosis not present

## 2021-10-14 DIAGNOSIS — G4733 Obstructive sleep apnea (adult) (pediatric): Secondary | ICD-10-CM | POA: Diagnosis not present

## 2021-10-14 DIAGNOSIS — F02B4 Dementia in other diseases classified elsewhere, moderate, with anxiety: Secondary | ICD-10-CM | POA: Diagnosis not present

## 2021-10-14 DIAGNOSIS — M519 Unspecified thoracic, thoracolumbar and lumbosacral intervertebral disc disorder: Secondary | ICD-10-CM | POA: Diagnosis not present

## 2021-10-14 DIAGNOSIS — E1122 Type 2 diabetes mellitus with diabetic chronic kidney disease: Secondary | ICD-10-CM | POA: Diagnosis not present

## 2021-10-14 DIAGNOSIS — N183 Chronic kidney disease, stage 3 unspecified: Secondary | ICD-10-CM | POA: Diagnosis not present

## 2021-10-14 DIAGNOSIS — Z466 Encounter for fitting and adjustment of urinary device: Secondary | ICD-10-CM | POA: Diagnosis not present

## 2021-10-15 DIAGNOSIS — E559 Vitamin D deficiency, unspecified: Secondary | ICD-10-CM | POA: Diagnosis not present

## 2021-10-15 DIAGNOSIS — G4720 Circadian rhythm sleep disorder, unspecified type: Secondary | ICD-10-CM | POA: Diagnosis not present

## 2021-10-15 DIAGNOSIS — K219 Gastro-esophageal reflux disease without esophagitis: Secondary | ICD-10-CM | POA: Diagnosis not present

## 2021-10-15 DIAGNOSIS — R339 Retention of urine, unspecified: Secondary | ICD-10-CM | POA: Diagnosis not present

## 2021-10-15 DIAGNOSIS — M519 Unspecified thoracic, thoracolumbar and lumbosacral intervertebral disc disorder: Secondary | ICD-10-CM | POA: Diagnosis not present

## 2021-10-15 DIAGNOSIS — E785 Hyperlipidemia, unspecified: Secondary | ICD-10-CM | POA: Diagnosis not present

## 2021-10-15 DIAGNOSIS — I5032 Chronic diastolic (congestive) heart failure: Secondary | ICD-10-CM | POA: Diagnosis not present

## 2021-10-15 DIAGNOSIS — I130 Hypertensive heart and chronic kidney disease with heart failure and stage 1 through stage 4 chronic kidney disease, or unspecified chronic kidney disease: Secondary | ICD-10-CM | POA: Diagnosis not present

## 2021-10-15 DIAGNOSIS — F02B3 Dementia in other diseases classified elsewhere, moderate, with mood disturbance: Secondary | ICD-10-CM | POA: Diagnosis not present

## 2021-10-15 DIAGNOSIS — M1990 Unspecified osteoarthritis, unspecified site: Secondary | ICD-10-CM | POA: Diagnosis not present

## 2021-10-15 DIAGNOSIS — Z87891 Personal history of nicotine dependence: Secondary | ICD-10-CM | POA: Diagnosis not present

## 2021-10-15 DIAGNOSIS — R296 Repeated falls: Secondary | ICD-10-CM | POA: Diagnosis not present

## 2021-10-15 DIAGNOSIS — N183 Chronic kidney disease, stage 3 unspecified: Secondary | ICD-10-CM | POA: Diagnosis not present

## 2021-10-15 DIAGNOSIS — I447 Left bundle-branch block, unspecified: Secondary | ICD-10-CM | POA: Diagnosis not present

## 2021-10-15 DIAGNOSIS — Z466 Encounter for fitting and adjustment of urinary device: Secondary | ICD-10-CM | POA: Diagnosis not present

## 2021-10-15 DIAGNOSIS — G4733 Obstructive sleep apnea (adult) (pediatric): Secondary | ICD-10-CM | POA: Diagnosis not present

## 2021-10-15 DIAGNOSIS — F02B4 Dementia in other diseases classified elsewhere, moderate, with anxiety: Secondary | ICD-10-CM | POA: Diagnosis not present

## 2021-10-15 DIAGNOSIS — D631 Anemia in chronic kidney disease: Secondary | ICD-10-CM | POA: Diagnosis not present

## 2021-10-15 DIAGNOSIS — E1122 Type 2 diabetes mellitus with diabetic chronic kidney disease: Secondary | ICD-10-CM | POA: Diagnosis not present

## 2021-10-15 DIAGNOSIS — Z9181 History of falling: Secondary | ICD-10-CM | POA: Diagnosis not present

## 2021-10-15 DIAGNOSIS — G301 Alzheimer's disease with late onset: Secondary | ICD-10-CM | POA: Diagnosis not present

## 2021-10-21 DIAGNOSIS — R339 Retention of urine, unspecified: Secondary | ICD-10-CM | POA: Diagnosis not present

## 2021-10-21 DIAGNOSIS — N183 Chronic kidney disease, stage 3 unspecified: Secondary | ICD-10-CM | POA: Diagnosis not present

## 2021-10-21 DIAGNOSIS — D631 Anemia in chronic kidney disease: Secondary | ICD-10-CM | POA: Diagnosis not present

## 2021-10-21 DIAGNOSIS — E559 Vitamin D deficiency, unspecified: Secondary | ICD-10-CM | POA: Diagnosis not present

## 2021-10-21 DIAGNOSIS — Z9181 History of falling: Secondary | ICD-10-CM | POA: Diagnosis not present

## 2021-10-21 DIAGNOSIS — M519 Unspecified thoracic, thoracolumbar and lumbosacral intervertebral disc disorder: Secondary | ICD-10-CM | POA: Diagnosis not present

## 2021-10-21 DIAGNOSIS — I447 Left bundle-branch block, unspecified: Secondary | ICD-10-CM | POA: Diagnosis not present

## 2021-10-21 DIAGNOSIS — E1122 Type 2 diabetes mellitus with diabetic chronic kidney disease: Secondary | ICD-10-CM | POA: Diagnosis not present

## 2021-10-21 DIAGNOSIS — G4720 Circadian rhythm sleep disorder, unspecified type: Secondary | ICD-10-CM | POA: Diagnosis not present

## 2021-10-21 DIAGNOSIS — K219 Gastro-esophageal reflux disease without esophagitis: Secondary | ICD-10-CM | POA: Diagnosis not present

## 2021-10-21 DIAGNOSIS — G301 Alzheimer's disease with late onset: Secondary | ICD-10-CM | POA: Diagnosis not present

## 2021-10-21 DIAGNOSIS — I5032 Chronic diastolic (congestive) heart failure: Secondary | ICD-10-CM | POA: Diagnosis not present

## 2021-10-21 DIAGNOSIS — R296 Repeated falls: Secondary | ICD-10-CM | POA: Diagnosis not present

## 2021-10-21 DIAGNOSIS — E785 Hyperlipidemia, unspecified: Secondary | ICD-10-CM | POA: Diagnosis not present

## 2021-10-21 DIAGNOSIS — Z87891 Personal history of nicotine dependence: Secondary | ICD-10-CM | POA: Diagnosis not present

## 2021-10-21 DIAGNOSIS — F02B4 Dementia in other diseases classified elsewhere, moderate, with anxiety: Secondary | ICD-10-CM | POA: Diagnosis not present

## 2021-10-21 DIAGNOSIS — G4733 Obstructive sleep apnea (adult) (pediatric): Secondary | ICD-10-CM | POA: Diagnosis not present

## 2021-10-21 DIAGNOSIS — M1990 Unspecified osteoarthritis, unspecified site: Secondary | ICD-10-CM | POA: Diagnosis not present

## 2021-10-21 DIAGNOSIS — I130 Hypertensive heart and chronic kidney disease with heart failure and stage 1 through stage 4 chronic kidney disease, or unspecified chronic kidney disease: Secondary | ICD-10-CM | POA: Diagnosis not present

## 2021-10-21 DIAGNOSIS — Z466 Encounter for fitting and adjustment of urinary device: Secondary | ICD-10-CM | POA: Diagnosis not present

## 2021-10-21 DIAGNOSIS — F02B3 Dementia in other diseases classified elsewhere, moderate, with mood disturbance: Secondary | ICD-10-CM | POA: Diagnosis not present

## 2021-10-22 ENCOUNTER — Telehealth: Payer: Self-pay | Admitting: Gastroenterology

## 2021-10-22 DIAGNOSIS — Z87891 Personal history of nicotine dependence: Secondary | ICD-10-CM | POA: Diagnosis not present

## 2021-10-22 DIAGNOSIS — Z9181 History of falling: Secondary | ICD-10-CM | POA: Diagnosis not present

## 2021-10-22 DIAGNOSIS — E785 Hyperlipidemia, unspecified: Secondary | ICD-10-CM | POA: Diagnosis not present

## 2021-10-22 DIAGNOSIS — N183 Chronic kidney disease, stage 3 unspecified: Secondary | ICD-10-CM | POA: Diagnosis not present

## 2021-10-22 DIAGNOSIS — M1990 Unspecified osteoarthritis, unspecified site: Secondary | ICD-10-CM | POA: Diagnosis not present

## 2021-10-22 DIAGNOSIS — M519 Unspecified thoracic, thoracolumbar and lumbosacral intervertebral disc disorder: Secondary | ICD-10-CM | POA: Diagnosis not present

## 2021-10-22 DIAGNOSIS — I130 Hypertensive heart and chronic kidney disease with heart failure and stage 1 through stage 4 chronic kidney disease, or unspecified chronic kidney disease: Secondary | ICD-10-CM | POA: Diagnosis not present

## 2021-10-22 DIAGNOSIS — Z466 Encounter for fitting and adjustment of urinary device: Secondary | ICD-10-CM | POA: Diagnosis not present

## 2021-10-22 DIAGNOSIS — I447 Left bundle-branch block, unspecified: Secondary | ICD-10-CM | POA: Diagnosis not present

## 2021-10-22 DIAGNOSIS — F02B4 Dementia in other diseases classified elsewhere, moderate, with anxiety: Secondary | ICD-10-CM | POA: Diagnosis not present

## 2021-10-22 DIAGNOSIS — E559 Vitamin D deficiency, unspecified: Secondary | ICD-10-CM | POA: Diagnosis not present

## 2021-10-22 DIAGNOSIS — K219 Gastro-esophageal reflux disease without esophagitis: Secondary | ICD-10-CM | POA: Diagnosis not present

## 2021-10-22 DIAGNOSIS — R339 Retention of urine, unspecified: Secondary | ICD-10-CM | POA: Diagnosis not present

## 2021-10-22 DIAGNOSIS — G301 Alzheimer's disease with late onset: Secondary | ICD-10-CM | POA: Diagnosis not present

## 2021-10-22 DIAGNOSIS — D631 Anemia in chronic kidney disease: Secondary | ICD-10-CM | POA: Diagnosis not present

## 2021-10-22 DIAGNOSIS — G4720 Circadian rhythm sleep disorder, unspecified type: Secondary | ICD-10-CM | POA: Diagnosis not present

## 2021-10-22 DIAGNOSIS — F02B3 Dementia in other diseases classified elsewhere, moderate, with mood disturbance: Secondary | ICD-10-CM | POA: Diagnosis not present

## 2021-10-22 DIAGNOSIS — E1122 Type 2 diabetes mellitus with diabetic chronic kidney disease: Secondary | ICD-10-CM | POA: Diagnosis not present

## 2021-10-22 DIAGNOSIS — I5032 Chronic diastolic (congestive) heart failure: Secondary | ICD-10-CM | POA: Diagnosis not present

## 2021-10-22 DIAGNOSIS — G4733 Obstructive sleep apnea (adult) (pediatric): Secondary | ICD-10-CM | POA: Diagnosis not present

## 2021-10-22 DIAGNOSIS — R296 Repeated falls: Secondary | ICD-10-CM | POA: Diagnosis not present

## 2021-10-22 NOTE — Telephone Encounter (Signed)
Inbound call from patients wife stating that patient had a colonoscopy in July with Dr. Havery Moros and that Dr. Havery Moros wanted him to have an EGD. Patient at that time did not want to follow through with the procedure but is now wanting to discuss and schedule. Patients wife is wanting to know if he needs to have an OV first or if he can schedule the appointment. [place] advise.

## 2021-10-22 NOTE — Telephone Encounter (Signed)
I can see that I saw him for a colonoscopy but he had no clinic visit prior to that.  We may have talked about this after his colonoscopy exam but there is no documentation in the chart about it.    Can you clarify with them why he needs an endoscopy at this time?  Is he having dysphagia?.  If he is having dysphagia then I think reasonable to direct book for an EGD in the [facility].  If more for reflux  or other issues etc. may be best to see him in the clinic first.  Thanks

## 2021-10-23 NOTE — Telephone Encounter (Signed)
Returned call to patient and his wife. Pt states that his GERD is under control on Omeprazole daily. Pt states that sometimes he does have difficulty swallowing his medications. I offered to book patient for a PV and direct EGD, pt would like to be seen in the office to determine if he actually needs an EGD. Pt has been scheduled for a f/u appt on Friday, 11/22/21 at 10 am. Pt and his wife verbalized understanding and had no concerns at the end of the call.

## 2021-10-28 ENCOUNTER — Encounter: Payer: Self-pay | Admitting: Family Medicine

## 2021-10-28 DIAGNOSIS — G301 Alzheimer's disease with late onset: Secondary | ICD-10-CM | POA: Diagnosis not present

## 2021-10-28 DIAGNOSIS — M1990 Unspecified osteoarthritis, unspecified site: Secondary | ICD-10-CM | POA: Diagnosis not present

## 2021-10-28 DIAGNOSIS — M519 Unspecified thoracic, thoracolumbar and lumbosacral intervertebral disc disorder: Secondary | ICD-10-CM | POA: Diagnosis not present

## 2021-10-28 DIAGNOSIS — N183 Chronic kidney disease, stage 3 unspecified: Secondary | ICD-10-CM | POA: Diagnosis not present

## 2021-10-28 DIAGNOSIS — D631 Anemia in chronic kidney disease: Secondary | ICD-10-CM | POA: Diagnosis not present

## 2021-10-28 DIAGNOSIS — Z466 Encounter for fitting and adjustment of urinary device: Secondary | ICD-10-CM | POA: Diagnosis not present

## 2021-10-28 DIAGNOSIS — K219 Gastro-esophageal reflux disease without esophagitis: Secondary | ICD-10-CM | POA: Diagnosis not present

## 2021-10-28 DIAGNOSIS — R339 Retention of urine, unspecified: Secondary | ICD-10-CM | POA: Diagnosis not present

## 2021-10-28 DIAGNOSIS — F02B3 Dementia in other diseases classified elsewhere, moderate, with mood disturbance: Secondary | ICD-10-CM | POA: Diagnosis not present

## 2021-10-28 DIAGNOSIS — I130 Hypertensive heart and chronic kidney disease with heart failure and stage 1 through stage 4 chronic kidney disease, or unspecified chronic kidney disease: Secondary | ICD-10-CM | POA: Diagnosis not present

## 2021-10-28 DIAGNOSIS — G4733 Obstructive sleep apnea (adult) (pediatric): Secondary | ICD-10-CM | POA: Diagnosis not present

## 2021-10-28 DIAGNOSIS — I447 Left bundle-branch block, unspecified: Secondary | ICD-10-CM | POA: Diagnosis not present

## 2021-10-28 DIAGNOSIS — E785 Hyperlipidemia, unspecified: Secondary | ICD-10-CM | POA: Diagnosis not present

## 2021-10-28 DIAGNOSIS — Z87891 Personal history of nicotine dependence: Secondary | ICD-10-CM | POA: Diagnosis not present

## 2021-10-28 DIAGNOSIS — Z9181 History of falling: Secondary | ICD-10-CM | POA: Diagnosis not present

## 2021-10-28 DIAGNOSIS — G4720 Circadian rhythm sleep disorder, unspecified type: Secondary | ICD-10-CM | POA: Diagnosis not present

## 2021-10-28 DIAGNOSIS — E1122 Type 2 diabetes mellitus with diabetic chronic kidney disease: Secondary | ICD-10-CM | POA: Diagnosis not present

## 2021-10-28 DIAGNOSIS — F02B4 Dementia in other diseases classified elsewhere, moderate, with anxiety: Secondary | ICD-10-CM | POA: Diagnosis not present

## 2021-10-28 DIAGNOSIS — E559 Vitamin D deficiency, unspecified: Secondary | ICD-10-CM | POA: Diagnosis not present

## 2021-10-28 DIAGNOSIS — R296 Repeated falls: Secondary | ICD-10-CM | POA: Diagnosis not present

## 2021-10-28 DIAGNOSIS — I5032 Chronic diastolic (congestive) heart failure: Secondary | ICD-10-CM | POA: Diagnosis not present

## 2021-10-30 DIAGNOSIS — R3914 Feeling of incomplete bladder emptying: Secondary | ICD-10-CM | POA: Diagnosis not present

## 2021-10-30 NOTE — Telephone Encounter (Signed)
Done. Thank you.

## 2021-10-30 NOTE — Telephone Encounter (Signed)
Orders for [facility].received by fax.  Need signature.

## 2021-10-30 NOTE — Telephone Encounter (Signed)
To medical records to fax.

## 2021-11-04 ENCOUNTER — Encounter (INDEPENDENT_AMBULATORY_CARE_PROVIDER_SITE_OTHER): Payer: Medicare Other | Admitting: Ophthalmology

## 2021-11-04 DIAGNOSIS — G4733 Obstructive sleep apnea (adult) (pediatric): Secondary | ICD-10-CM | POA: Diagnosis not present

## 2021-11-04 DIAGNOSIS — D631 Anemia in chronic kidney disease: Secondary | ICD-10-CM | POA: Diagnosis not present

## 2021-11-04 DIAGNOSIS — I447 Left bundle-branch block, unspecified: Secondary | ICD-10-CM | POA: Diagnosis not present

## 2021-11-04 DIAGNOSIS — G4720 Circadian rhythm sleep disorder, unspecified type: Secondary | ICD-10-CM | POA: Diagnosis not present

## 2021-11-04 DIAGNOSIS — E559 Vitamin D deficiency, unspecified: Secondary | ICD-10-CM | POA: Diagnosis not present

## 2021-11-04 DIAGNOSIS — F02B3 Dementia in other diseases classified elsewhere, moderate, with mood disturbance: Secondary | ICD-10-CM | POA: Diagnosis not present

## 2021-11-04 DIAGNOSIS — R296 Repeated falls: Secondary | ICD-10-CM | POA: Diagnosis not present

## 2021-11-04 DIAGNOSIS — F02B4 Dementia in other diseases classified elsewhere, moderate, with anxiety: Secondary | ICD-10-CM | POA: Diagnosis not present

## 2021-11-04 DIAGNOSIS — I130 Hypertensive heart and chronic kidney disease with heart failure and stage 1 through stage 4 chronic kidney disease, or unspecified chronic kidney disease: Secondary | ICD-10-CM | POA: Diagnosis not present

## 2021-11-04 DIAGNOSIS — Z466 Encounter for fitting and adjustment of urinary device: Secondary | ICD-10-CM | POA: Diagnosis not present

## 2021-11-04 DIAGNOSIS — I5032 Chronic diastolic (congestive) heart failure: Secondary | ICD-10-CM | POA: Diagnosis not present

## 2021-11-04 DIAGNOSIS — E785 Hyperlipidemia, unspecified: Secondary | ICD-10-CM | POA: Diagnosis not present

## 2021-11-04 DIAGNOSIS — R339 Retention of urine, unspecified: Secondary | ICD-10-CM | POA: Diagnosis not present

## 2021-11-04 DIAGNOSIS — Z87891 Personal history of nicotine dependence: Secondary | ICD-10-CM | POA: Diagnosis not present

## 2021-11-04 DIAGNOSIS — N183 Chronic kidney disease, stage 3 unspecified: Secondary | ICD-10-CM | POA: Diagnosis not present

## 2021-11-04 DIAGNOSIS — M1990 Unspecified osteoarthritis, unspecified site: Secondary | ICD-10-CM | POA: Diagnosis not present

## 2021-11-04 DIAGNOSIS — Z9181 History of falling: Secondary | ICD-10-CM | POA: Diagnosis not present

## 2021-11-04 DIAGNOSIS — G301 Alzheimer's disease with late onset: Secondary | ICD-10-CM | POA: Diagnosis not present

## 2021-11-04 DIAGNOSIS — M519 Unspecified thoracic, thoracolumbar and lumbosacral intervertebral disc disorder: Secondary | ICD-10-CM | POA: Diagnosis not present

## 2021-11-04 DIAGNOSIS — K219 Gastro-esophageal reflux disease without esophagitis: Secondary | ICD-10-CM | POA: Diagnosis not present

## 2021-11-04 DIAGNOSIS — E1122 Type 2 diabetes mellitus with diabetic chronic kidney disease: Secondary | ICD-10-CM | POA: Diagnosis not present

## 2021-11-05 DIAGNOSIS — I5032 Chronic diastolic (congestive) heart failure: Secondary | ICD-10-CM | POA: Diagnosis not present

## 2021-11-05 DIAGNOSIS — G301 Alzheimer's disease with late onset: Secondary | ICD-10-CM | POA: Diagnosis not present

## 2021-11-05 DIAGNOSIS — E559 Vitamin D deficiency, unspecified: Secondary | ICD-10-CM | POA: Diagnosis not present

## 2021-11-05 DIAGNOSIS — E785 Hyperlipidemia, unspecified: Secondary | ICD-10-CM | POA: Diagnosis not present

## 2021-11-05 DIAGNOSIS — R296 Repeated falls: Secondary | ICD-10-CM | POA: Diagnosis not present

## 2021-11-05 DIAGNOSIS — I130 Hypertensive heart and chronic kidney disease with heart failure and stage 1 through stage 4 chronic kidney disease, or unspecified chronic kidney disease: Secondary | ICD-10-CM | POA: Diagnosis not present

## 2021-11-05 DIAGNOSIS — Z87891 Personal history of nicotine dependence: Secondary | ICD-10-CM | POA: Diagnosis not present

## 2021-11-05 DIAGNOSIS — F02B3 Dementia in other diseases classified elsewhere, moderate, with mood disturbance: Secondary | ICD-10-CM | POA: Diagnosis not present

## 2021-11-05 DIAGNOSIS — E1122 Type 2 diabetes mellitus with diabetic chronic kidney disease: Secondary | ICD-10-CM | POA: Diagnosis not present

## 2021-11-05 DIAGNOSIS — G4733 Obstructive sleep apnea (adult) (pediatric): Secondary | ICD-10-CM | POA: Diagnosis not present

## 2021-11-05 DIAGNOSIS — I447 Left bundle-branch block, unspecified: Secondary | ICD-10-CM | POA: Diagnosis not present

## 2021-11-05 DIAGNOSIS — G4720 Circadian rhythm sleep disorder, unspecified type: Secondary | ICD-10-CM | POA: Diagnosis not present

## 2021-11-05 DIAGNOSIS — K219 Gastro-esophageal reflux disease without esophagitis: Secondary | ICD-10-CM | POA: Diagnosis not present

## 2021-11-05 DIAGNOSIS — N183 Chronic kidney disease, stage 3 unspecified: Secondary | ICD-10-CM | POA: Diagnosis not present

## 2021-11-05 DIAGNOSIS — M519 Unspecified thoracic, thoracolumbar and lumbosacral intervertebral disc disorder: Secondary | ICD-10-CM | POA: Diagnosis not present

## 2021-11-05 DIAGNOSIS — D631 Anemia in chronic kidney disease: Secondary | ICD-10-CM | POA: Diagnosis not present

## 2021-11-05 DIAGNOSIS — Z9181 History of falling: Secondary | ICD-10-CM | POA: Diagnosis not present

## 2021-11-05 DIAGNOSIS — Z466 Encounter for fitting and adjustment of urinary device: Secondary | ICD-10-CM | POA: Diagnosis not present

## 2021-11-05 DIAGNOSIS — F02B4 Dementia in other diseases classified elsewhere, moderate, with anxiety: Secondary | ICD-10-CM | POA: Diagnosis not present

## 2021-11-05 DIAGNOSIS — R339 Retention of urine, unspecified: Secondary | ICD-10-CM | POA: Diagnosis not present

## 2021-11-05 DIAGNOSIS — M1990 Unspecified osteoarthritis, unspecified site: Secondary | ICD-10-CM | POA: Diagnosis not present

## 2021-11-11 DIAGNOSIS — E1122 Type 2 diabetes mellitus with diabetic chronic kidney disease: Secondary | ICD-10-CM | POA: Diagnosis not present

## 2021-11-11 DIAGNOSIS — G4720 Circadian rhythm sleep disorder, unspecified type: Secondary | ICD-10-CM | POA: Diagnosis not present

## 2021-11-11 DIAGNOSIS — Z9181 History of falling: Secondary | ICD-10-CM | POA: Diagnosis not present

## 2021-11-11 DIAGNOSIS — Z87891 Personal history of nicotine dependence: Secondary | ICD-10-CM | POA: Diagnosis not present

## 2021-11-11 DIAGNOSIS — I447 Left bundle-branch block, unspecified: Secondary | ICD-10-CM | POA: Diagnosis not present

## 2021-11-11 DIAGNOSIS — F02B4 Dementia in other diseases classified elsewhere, moderate, with anxiety: Secondary | ICD-10-CM | POA: Diagnosis not present

## 2021-11-11 DIAGNOSIS — K219 Gastro-esophageal reflux disease without esophagitis: Secondary | ICD-10-CM | POA: Diagnosis not present

## 2021-11-11 DIAGNOSIS — D631 Anemia in chronic kidney disease: Secondary | ICD-10-CM | POA: Diagnosis not present

## 2021-11-11 DIAGNOSIS — N183 Chronic kidney disease, stage 3 unspecified: Secondary | ICD-10-CM | POA: Diagnosis not present

## 2021-11-11 DIAGNOSIS — E785 Hyperlipidemia, unspecified: Secondary | ICD-10-CM | POA: Diagnosis not present

## 2021-11-11 DIAGNOSIS — G301 Alzheimer's disease with late onset: Secondary | ICD-10-CM | POA: Diagnosis not present

## 2021-11-11 DIAGNOSIS — M519 Unspecified thoracic, thoracolumbar and lumbosacral intervertebral disc disorder: Secondary | ICD-10-CM | POA: Diagnosis not present

## 2021-11-11 DIAGNOSIS — I5032 Chronic diastolic (congestive) heart failure: Secondary | ICD-10-CM | POA: Diagnosis not present

## 2021-11-11 DIAGNOSIS — R296 Repeated falls: Secondary | ICD-10-CM | POA: Diagnosis not present

## 2021-11-11 DIAGNOSIS — F02B3 Dementia in other diseases classified elsewhere, moderate, with mood disturbance: Secondary | ICD-10-CM | POA: Diagnosis not present

## 2021-11-11 DIAGNOSIS — G4733 Obstructive sleep apnea (adult) (pediatric): Secondary | ICD-10-CM | POA: Diagnosis not present

## 2021-11-11 DIAGNOSIS — M1990 Unspecified osteoarthritis, unspecified site: Secondary | ICD-10-CM | POA: Diagnosis not present

## 2021-11-11 DIAGNOSIS — E559 Vitamin D deficiency, unspecified: Secondary | ICD-10-CM | POA: Diagnosis not present

## 2021-11-11 DIAGNOSIS — Z466 Encounter for fitting and adjustment of urinary device: Secondary | ICD-10-CM | POA: Diagnosis not present

## 2021-11-11 DIAGNOSIS — I130 Hypertensive heart and chronic kidney disease with heart failure and stage 1 through stage 4 chronic kidney disease, or unspecified chronic kidney disease: Secondary | ICD-10-CM | POA: Diagnosis not present

## 2021-11-11 DIAGNOSIS — R339 Retention of urine, unspecified: Secondary | ICD-10-CM | POA: Diagnosis not present

## 2021-11-13 ENCOUNTER — Ambulatory Visit: Payer: Medicare Other | Admitting: Neurology

## 2021-11-18 DIAGNOSIS — I130 Hypertensive heart and chronic kidney disease with heart failure and stage 1 through stage 4 chronic kidney disease, or unspecified chronic kidney disease: Secondary | ICD-10-CM | POA: Diagnosis not present

## 2021-11-18 DIAGNOSIS — Z87891 Personal history of nicotine dependence: Secondary | ICD-10-CM | POA: Diagnosis not present

## 2021-11-18 DIAGNOSIS — G4720 Circadian rhythm sleep disorder, unspecified type: Secondary | ICD-10-CM | POA: Diagnosis not present

## 2021-11-18 DIAGNOSIS — N183 Chronic kidney disease, stage 3 unspecified: Secondary | ICD-10-CM | POA: Diagnosis not present

## 2021-11-18 DIAGNOSIS — M1990 Unspecified osteoarthritis, unspecified site: Secondary | ICD-10-CM | POA: Diagnosis not present

## 2021-11-18 DIAGNOSIS — M519 Unspecified thoracic, thoracolumbar and lumbosacral intervertebral disc disorder: Secondary | ICD-10-CM | POA: Diagnosis not present

## 2021-11-18 DIAGNOSIS — F02B4 Dementia in other diseases classified elsewhere, moderate, with anxiety: Secondary | ICD-10-CM | POA: Diagnosis not present

## 2021-11-18 DIAGNOSIS — E785 Hyperlipidemia, unspecified: Secondary | ICD-10-CM | POA: Diagnosis not present

## 2021-11-18 DIAGNOSIS — I447 Left bundle-branch block, unspecified: Secondary | ICD-10-CM | POA: Diagnosis not present

## 2021-11-18 DIAGNOSIS — E559 Vitamin D deficiency, unspecified: Secondary | ICD-10-CM | POA: Diagnosis not present

## 2021-11-18 DIAGNOSIS — E1122 Type 2 diabetes mellitus with diabetic chronic kidney disease: Secondary | ICD-10-CM | POA: Diagnosis not present

## 2021-11-18 DIAGNOSIS — F02B3 Dementia in other diseases classified elsewhere, moderate, with mood disturbance: Secondary | ICD-10-CM | POA: Diagnosis not present

## 2021-11-18 DIAGNOSIS — G4733 Obstructive sleep apnea (adult) (pediatric): Secondary | ICD-10-CM | POA: Diagnosis not present

## 2021-11-18 DIAGNOSIS — Z466 Encounter for fitting and adjustment of urinary device: Secondary | ICD-10-CM | POA: Diagnosis not present

## 2021-11-18 DIAGNOSIS — G301 Alzheimer's disease with late onset: Secondary | ICD-10-CM | POA: Diagnosis not present

## 2021-11-18 DIAGNOSIS — R296 Repeated falls: Secondary | ICD-10-CM | POA: Diagnosis not present

## 2021-11-18 DIAGNOSIS — K219 Gastro-esophageal reflux disease without esophagitis: Secondary | ICD-10-CM | POA: Diagnosis not present

## 2021-11-18 DIAGNOSIS — D631 Anemia in chronic kidney disease: Secondary | ICD-10-CM | POA: Diagnosis not present

## 2021-11-18 DIAGNOSIS — Z9181 History of falling: Secondary | ICD-10-CM | POA: Diagnosis not present

## 2021-11-18 DIAGNOSIS — R339 Retention of urine, unspecified: Secondary | ICD-10-CM | POA: Diagnosis not present

## 2021-11-18 DIAGNOSIS — I5032 Chronic diastolic (congestive) heart failure: Secondary | ICD-10-CM | POA: Diagnosis not present

## 2021-11-22 ENCOUNTER — Encounter: Payer: Self-pay | Admitting: Nurse Practitioner

## 2021-11-22 ENCOUNTER — Ambulatory Visit: Payer: Medicare Other | Admitting: Nurse Practitioner

## 2021-11-22 ENCOUNTER — Other Ambulatory Visit (INDEPENDENT_AMBULATORY_CARE_PROVIDER_SITE_OTHER): Payer: Medicare Other

## 2021-11-22 VITALS — BP 96/52 | HR 92 | Ht 66.0 in | Wt 142.0 lb

## 2021-11-22 DIAGNOSIS — R1310 Dysphagia, unspecified: Secondary | ICD-10-CM

## 2021-11-22 DIAGNOSIS — K219 Gastro-esophageal reflux disease without esophagitis: Secondary | ICD-10-CM | POA: Diagnosis not present

## 2021-11-22 DIAGNOSIS — D508 Other iron deficiency anemias: Secondary | ICD-10-CM

## 2021-11-22 LAB — CBC WITH DIFFERENTIAL/PLATELET
Basophils Absolute: 0 10*3/uL (ref 0.0–0.1)
Basophils Relative: 0.4 % (ref 0.0–3.0)
Eosinophils Absolute: 0.1 10*3/uL (ref 0.0–0.7)
Eosinophils Relative: 2.9 % (ref 0.0–5.0)
HCT: 29.6 % — ABNORMAL LOW (ref 39.0–52.0)
Hemoglobin: 9.9 g/dL — ABNORMAL LOW (ref 13.0–17.0)
Lymphocytes Relative: 28.5 % (ref 12.0–46.0)
Lymphs Abs: 1.3 10*3/uL (ref 0.7–4.0)
MCHC: 33.5 g/dL (ref 30.0–36.0)
MCV: 88 fl (ref 78.0–100.0)
Monocytes Absolute: 0.4 10*3/uL (ref 0.1–1.0)
Monocytes Relative: 7.9 % (ref 3.0–12.0)
Neutro Abs: 2.9 10*3/uL (ref 1.4–7.7)
Neutrophils Relative %: 60.3 % (ref 43.0–77.0)
Platelets: 144 10*3/uL — ABNORMAL LOW (ref 150.0–400.0)
RBC: 3.36 Mil/uL — ABNORMAL LOW (ref 4.22–5.81)
RDW: 14.9 % (ref 11.5–15.5)
WBC: 4.7 10*3/uL (ref 4.0–10.5)

## 2021-11-22 LAB — COMPREHENSIVE METABOLIC PANEL
ALT: 11 U/L (ref 0–53)
AST: 15 U/L (ref 0–37)
Albumin: 3.7 g/dL (ref 3.5–5.2)
Alkaline Phosphatase: 59 U/L (ref 39–117)
BUN: 26 mg/dL — ABNORMAL HIGH (ref 6–23)
CO2: 25 mEq/L (ref 19–32)
Calcium: 9 mg/dL (ref 8.4–10.5)
Chloride: 109 mEq/L (ref 96–112)
Creatinine, Ser: 1.98 mg/dL — ABNORMAL HIGH (ref 0.40–1.50)
GFR: 32.29 mL/min — ABNORMAL LOW (ref >60.00)
Glucose, Bld: 74 mg/dL (ref 70–99)
Potassium: 3.8 mEq/L (ref 3.5–5.1)
Sodium: 141 mEq/L (ref 135–145)
Total Bilirubin: 0.5 mg/dL (ref 0.2–1.2)
Total Protein: 6.8 g/dL (ref 6.0–8.3)

## 2021-11-22 LAB — IBC + FERRITIN
Ferritin: 518.5 ng/mL — ABNORMAL HIGH (ref 22.0–322.0)
Iron: 47 ug/dL (ref 42–165)
Saturation Ratios: 26.2 % (ref 20.0–50.0)
TIBC: 179.2 ug/dL — ABNORMAL LOW (ref 250.0–450.0)
Transferrin: 128 mg/dL — ABNORMAL LOW (ref 212.0–360.0)

## 2021-11-22 NOTE — Patient Instructions (Signed)
_______________________________________________________  If you are age 76 or older, your body mass index should be between 23-30. Your Body mass index is 22.92 kg/m. If this is out of the aforementioned range listed, please consider follow up with your Primary Care Provider.  If you are age 69 or younger, your body mass index should be between 19-25. Your Body mass index is 22.92 kg/m. If this is out of the aformentioned range listed, please consider follow up with your Primary Care Provider.   ________________________________________________________  The [facility] providers would like to encourage you to use [facility] to communicate with providers for non-urgent requests or questions.  Due to long hold times on the telephone, sending your provider a message by [facility] may be a faster and more efficient way to get a response.  Please allow 48 business hours for a response.  Please remember that this is for non-urgent requests.  _______________________________________________________  Gerald [name] have been scheduled for an endoscopy. Please follow written instructions given to you at your visit today. If you use inhalers (even only as needed), please bring them with you on the day of your procedure.  Your provider has requested that you go to the basement level for lab work before leaving today. Press "B" on the elevator. The lab is located at the first door on the left as you exit the elevator.  Due to recent changes in healthcare laws, you may see the results of your imaging and laboratory studies on MyChart before your provider has had a chance to review them.  We understand that in some cases there may be results that are confusing or concerning to you. Not all laboratory results come back in the same time frame and the provider may be waiting for multiple results in order to interpret others.  Please give [place] 48 hours in order for your provider to thoroughly review all the results before contacting the  office for clarification of your results.    It was a pleasure to see you today!  Thank you for trusting me with your gastrointestinal care!

## 2021-11-22 NOTE — Progress Notes (Unsigned)
11/22/2021 Gerald [name] [id number] 1945/11/18   Chief Complaint:  History of Present Illness: Gerald [name]  He has some trouble eating foods such as chicken and large tablets which  Upper dentures only.   Omeprazole daily. He takes TUMS 2 times weekly. No upper abdominal pain. He vomited one week ago. He ate fast when he was hungry. Banana chocolate coconut cookies. A few hour hours later.   Miralax daily. Stool softener most days. No black stool. Twice in the past month. On oral Iron. Pept occ after he takes laxative    CKD 12/19th   Past Medical History:  Diagnosis Date   Abnormality of gait 10/05/2014   Anemia    Anxiety    Bilateral edema of lower extremity    LE Dopplers 4/22 - no thrombus or thrombophelbitis, no reflux   Blood transfusion without reported diagnosis    with surgery 2012   Cataract    removed both eyes   CHF (congestive heart failure) ([place])    Chronic kidney disease    Colon polyp    DDD (degenerative disc disease)    Depression    DJD (degenerative joint disease)    DOE (dyspnea on exertion)    Echo 04 /22/2014 - normal EF, mild LVH, grade 1 Diastolic Dysfunction; Lexiscan Myoview 04/28/12 - no ishcemia or infarction, LBBB septal wall motion; EF ~60%   Fatigue    Fatigue    pt states he gets tired very quickly   GERD (gastroesophageal reflux disease)    H/O iron deficiency anemia    with  hemmorrhoids   Hyperlipidemia    Hypertension    Left bundle branch block     chronic, negative Myoview a normal echo as noted above    Osteoarthritis    with bilateral hip surgeries and multiple complication, with the most recent  surgery in January 2012   Scrotal edema    Type 2 diabetes mellitus without complications ([place]) 35/57/3220   Vitamin D deficiency    Past Surgical History:  Procedure Laterality Date   Cardiology Nuclear Med Study   04/29/2012   overall impression ; NORMAL  STRESS  NUCLEAR STUDY   COLONOSCOPY  01/28/2010   HIP  SURGERY Right 01/06/2002   replacement   HIP SURGERY Left 02/2010   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 found on perianal exam - benign appearing. - Diverticulosis in the left colon and in the right colon. - One 3 mm polyp in the descending colon, removed with - Diverticulosis in the left colon and in the right colon. - One 3 mm polyp in the descending colon, removed with a cold snare. Resected and retrieved. - Internal hemorrhoids. - Anal papilla(e) were hypertrophied. - The examination was otherwise normal.     Latest Ref Rng & Units 09/16/2021    5:06 AM 09/15/2021    5:48 AM 09/14/2021    2:56 AM  CBC  WBC 4.0 - 10.5 K/uL 5.4  5.5  6.2   Hemoglobin 13.0 - 17.0 g/dL 8.8  8.7  9.9   Hematocrit 39.0 -  52.0 % 27.5  27.6  30.1   Platelets 150 - 400 K/uL 104  107  133         Latest Ref Rng & Units 09/17/2021    5:33 AM 09/16/2021    5:06 AM 09/15/2021    5:48 AM  CMP  Glucose 70 - 99 mg/dL 89  76  73   BUN 8 - 23 mg/dL '23  29  29   '$ Creatinine 0.61 - 1.24 mg/dL 1.77  1.64  1.82   Sodium 135 - 145 mmol/L 142  143  146   Potassium 3.5 - 5.1 mmol/L 3.9  3.3  3.7   Chloride 98 - 111 mmol/L 115  116  120   CO2 22 - 32 mmol/L '22  21  22   '$ Calcium 8.9 - 10.3 mg/dL 9.0  9.0  8.8         Latest Ref Rng & Units 09/17/2021    5:33 AM 09/16/2021    5:06 AM 09/15/2021    5:48 AM  CMP  Glucose 70 - 99 mg/dL 89  76  73   BUN 8 - 23 mg/dL '23  29  29   '$ Creatinine 0.61 - 1.24 mg/dL 1.77  1.64  1.82   Sodium 135 - 145 mmol/L 142  143  146   Potassium 3.5 - 5.1 mmol/L 3.9  3.3  3.7   Chloride 98 - 111 mmol/L 115  116  120   CO2 22 - 32 mmol/L '22   21  22   '$ Calcium 8.9 - 10.3 mg/dL 9.0  9.0  8.8     Current Medications, Allergies, Past Medical History, Past Surgical History, Family History and Social History were reviewed in [employer] record.   Review of Systems:   Constitutional: Negative for fever, sweats, chills or weight loss.  Respiratory: Negative for shortness of breath.   Cardiovascular: Negative for chest pain, palpitations and leg swelling.  Gastrointestinal: See HPI.  Musculoskeletal: Negative for back pain or muscle aches.  Neurological: Negative for dizziness, headaches or paresthesias.    Physical Exam: BP (!) 96/52   Pulse 92   Ht '5\' 6"'$  (1.676 m)   Wt 142 lb (64.4 kg)   SpO2 99%   BMI 22.92 kg/m  General: Well developed, w   ***male in no acute distress. Head: Normocephalic and atraumatic. Eyes: No scleral icterus. Conjunctiva pink . Ears: Normal auditory acuity. Mouth: Dentition intact. No ulcers or lesions.  Lungs: Clear throughout to auscultation. Heart: Regular rate and rhythm, no murmur. Abdomen: Soft, nontender and nondistended. No masses or hepatomegaly. Normal bowel sounds x 4 quadrants.  Rectal: *** Musculoskeletal: Symmetrical with no gross deformities. Extremities: No edema. Neurological: Alert oriented x 4. No focal deficits.  Psychological: Alert and cooperative. Normal mood and affect  Assessment and Recommendations: ***

## 2021-11-23 NOTE — Progress Notes (Signed)
Agree with assessment and plan as outlined.  

## 2021-12-05 ENCOUNTER — Ambulatory Visit: Payer: Medicare Other | Admitting: Neurology

## 2021-12-05 ENCOUNTER — Encounter: Payer: Self-pay | Admitting: Neurology

## 2021-12-05 VITALS — BP 131/81 | HR 91 | Ht 66.0 in | Wt 145.0 lb

## 2021-12-05 DIAGNOSIS — G301 Alzheimer's disease with late onset: Secondary | ICD-10-CM | POA: Diagnosis not present

## 2021-12-05 DIAGNOSIS — N1831 Chronic kidney disease, stage 3a: Secondary | ICD-10-CM | POA: Diagnosis not present

## 2021-12-05 DIAGNOSIS — E785 Hyperlipidemia, unspecified: Secondary | ICD-10-CM | POA: Diagnosis not present

## 2021-12-05 DIAGNOSIS — F02A18 Dementia in other diseases classified elsewhere, mild, with other behavioral disturbance: Secondary | ICD-10-CM | POA: Diagnosis not present

## 2021-12-05 DIAGNOSIS — I5032 Chronic diastolic (congestive) heart failure: Secondary | ICD-10-CM | POA: Diagnosis not present

## 2021-12-05 DIAGNOSIS — I130 Hypertensive heart and chronic kidney disease with heart failure and stage 1 through stage 4 chronic kidney disease, or unspecified chronic kidney disease: Secondary | ICD-10-CM | POA: Diagnosis not present

## 2021-12-05 MED ORDER — QUETIAPINE FUMARATE 25 MG PO TABS: 25.0000 mg | ORAL_TABLET | Freq: Every day | ORAL | 3 refills | Status: DC
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
## 2021-12-05 NOTE — Patient Instructions (Signed)
Continue current medications, refill given Encourage physical therapy Follow-up with your doctors as indicated Return in 1 year or sooner if worse

## 2021-12-07 ENCOUNTER — Encounter: Payer: Self-pay | Admitting: Gastroenterology

## 2021-12-07 ENCOUNTER — Ambulatory Visit (AMBULATORY_SURGERY_CENTER): Payer: Medicare Other | Admitting: Gastroenterology

## 2021-12-07 VITALS — BP 134/62 | HR 74 | Temp 97.8°F | Resp 15 | Ht 66.0 in | Wt 142.0 lb

## 2021-12-07 DIAGNOSIS — R1310 Dysphagia, unspecified: Secondary | ICD-10-CM | POA: Diagnosis not present

## 2021-12-07 DIAGNOSIS — K2100 Gastro-esophageal reflux disease with esophagitis, without bleeding: Secondary | ICD-10-CM

## 2021-12-07 DIAGNOSIS — K3189 Other diseases of stomach and duodenum: Secondary | ICD-10-CM | POA: Diagnosis not present

## 2021-12-07 DIAGNOSIS — K222 Esophageal obstruction: Secondary | ICD-10-CM

## 2021-12-07 MED ORDER — SODIUM CHLORIDE 0.9 % IV SOLN: 500.0000 mL | Freq: Once | INTRAVENOUS | Status: DC

## 2021-12-07 NOTE — Progress Notes (Signed)
To pacu, VSS. Report to [email or web address] 

## 2021-12-07 NOTE — Op Note (Signed)
[name] Procedure Date: 12/07/2021 10:38 AM MRN: [id number] Endoscopist: Remo Lipps P. 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 The patient's tolerance of                            previous anesthesia was also reviewed. The risks                            and benefits of the procedure and the sedation                            options and risks were discussed with the patient.                            All questions were answered, and informed consent                            was obtained. Prior Anticoagulants: The patient has                            taken no anticoagulant or antiplatelet agents. ASA                            Grade Assessment: III - A patient with severe                            systemic disease. After reviewing the risks and                            benefits, the patient was deemed in satisfactory                            condition to undergo the procedure.  After obtaining informed consent, the endoscope was                            passed under direct vision. Throughout the                            procedure, the patient's blood pressure, pulse, and                            oxygen saturations were monitored continuously. The                            GIF [id number] #[id number] was introduced through the                            mouth,  and advanced to the second part of duodenum.                            The upper GI endoscopy was accomplished without                            difficulty. The patient tolerated the procedure                            well. Scope In: Scope Out: Findings:                 Esophagogastric landmarks were identified: the                            Z-line was found at 39 cm, the gastroesophageal                            junction was found at 39 cm and the upper extent of                            the gastric folds was found at 40 cm from the                            incisors.                           A 1 cm hiatal hernia was present.                           One benign-appearing, intrinsic stenosis was found                            39 cm from the incisors. Another very subtle and                            widely patent stricture noted a few cm's                            proximally.  A TTS dilator was passed through the                            scope. Dilation with a 12-13.5-15 mm balloon                            dilator was performed to 12 mm, 13.5 mm and then 15                            mm after which appropriate mucosal wrents were                            noted. This was biopsied with a cold forceps for                            opening the stricture further.                           The distal esophagus was quite tortuous with                            trachealization. Also, mucosa stained blue likely                            from recent liquid ingestion and stagnant motility.                            Biopsies were taken with a cold forceps for                            histology.                           The exam of the esophagus was otherwise normal. No                            high grade strictures.                           Patchy mildly erythematous mucosa was found in the                            gastric fundus and in the gastric body.                            The exam of the stomach was otherwise normal.                           Biopsies were taken with a cold forceps in the                            gastric body, at the incisura and in the gastric  antrum for Helicobacter pylori testing.                           The examined duodenum was normal. Complications:            No immediate complications. Estimated blood loss:                            Minimal. Estimated Blood Loss:     Estimated blood loss was minimal. Impression:               - Esophagogastric landmarks identified.                           - 1 cm hiatal hernia.                           - Benign-appearing esophageal stenosis. Dilated to                            35m with good result. Biopsied.                           - Tortuous distal esophagus with trachealization.                            Biopsied.                           - Erythematous mucosa in the gastric fundus and                            gastric body.                           - Normal stomach otherwise - biopsies taken to rule                            out H pylori                           - Normal examined duodenum.                           Stricture was dilated and I hope this helps the                            patient's dysphagia, however suspicious for                            underlying motility disorder given appearance of                            distal esophagus. Recommend barium swallow with                            tablet to further evaluate Recommendation:           -  Patient has a contact number available for                            emergencies. The signs and symptoms of potential                            delayed complications were discussed with the                            patient. Return to normal activities tomorrow.                            Written discharge instructions were provided to the                            patient.                            - Soft diet as tolerated.                           - Continue present medications (omeprazole)                           - Await pathology results and course post dilation                           - Recommend barium swallow with tablet to assess                            motility, will discuss with patient and family Carlota Raspberry. Jen Eppinger, MD 12/07/2021 11:30:39 AM This report has been signed electronically.

## 2021-12-07 NOTE — Progress Notes (Signed)
History and Physical Interval Note: Seen on 11/22/21 - no interval changes. History of recurrent dysphagia - EGDs years ago have shown distal esophageal stricture. Not eating well due to dysphagia and has lost weight. Also with chronic multifactorial anemia, colonoscopy up to date. On omeprazole '40mg'$  / day. EGD with possible dilation scheduled for today. Patient otherwise feels at baseline, wishes to proceed.  12/07/2021 10:44 AM  Gerald [name]  has presented today for endoscopic procedure(s), with the diagnosis of  Encounter Diagnosis  Name Primary?   Dysphagia, unspecified type Yes  .  The various methods of evaluation and treatment have been discussed with the patient and/or family. After consideration of risks, benefits and other options for treatment, the patient has consented to  the endoscopic procedure(s).   The patient's history has been reviewed, patient examined, no change in status, stable for surgery.  I have reviewed the patient's chart and labs.  Questions were answered to the patient's satisfaction.    [name], MD [facility]

## 2021-12-07 NOTE — Progress Notes (Signed)
Pt's states no medical or surgical changes since previsit or office visit. 

## 2021-12-07 NOTE — Progress Notes (Signed)
Called to room to assist during endoscopic procedure.  Patient ID and intended procedure confirmed with present staff. Received instructions for my participation in the procedure from the performing physician.  

## 2021-12-07 NOTE — Patient Instructions (Addendum)
RECOMMENDATIONS: - Patient has a contact number available for emergencies. The signs and symptoms of potential delayed complications were discussed with the patient. Return to normal activities tomorrow. Written discharge instructions were provided to the patient. - Soft diet as tolerated. - Continue present medications (omeprazole) - Await pathology results and course post dilation - Recommend barium swallow with tablet to assess motility, will discuss with patient and Family. Dr. Doyne Keel office nurse will call you to schedule this appointment.   YOU HAD AN ENDOSCOPIC PROCEDURE TODAY AT [facility]:   Refer to the procedure report that was given to you for any specific questions about what was found during the examination.  If the procedure report does not answer your questions, please call your gastroenterologist to clarify.  If you requested that your care partner not be given the details of your procedure findings, then the procedure report has been included in a sealed envelope for you to review at your convenience later.  YOU SHOULD EXPECT: Some feelings of bloating in the abdomen. Passage of more gas than usual.  Walking can help get rid of the air that was put into your GI tract during the procedure and reduce the bloating. If you had a lower endoscopy (such as a colonoscopy or flexible sigmoidoscopy) you may notice spotting of blood in your stool or on the toilet paper. If you underwent a bowel prep for your procedure, you may not have a normal bowel movement for a few days.  Please Note:  You might notice some irritation and congestion in your nose or some drainage.  This is from the oxygen used during your procedure.  There is no need for concern and it should clear up in a day or so.  SYMPTOMS TO REPORT IMMEDIATELY:  Following upper endoscopy (EGD)  Vomiting of blood or coffee ground material  New chest pain or pain under the shoulder blades  Painful or  persistently difficult swallowing  New shortness of breath  Fever of 100F or higher  Black, tarry-looking stools  For urgent or emergent issues, a gastroenterologist can be reached at any hour by calling [phone]. Do not use MyChart messaging for urgent concerns.    DIET:  Soft diet as tolerated (please see handout). We do recommend a small meal at first, but then you may proceed to your regular diet.  Drink plenty of fluids but you should avoid alcoholic beverages for 24 hours.  MEDICATIONS: Continue present medications (Omeprazole).  Please see handouts given to you by your recovery nurse.  Thank you for allowing [place] to provide for your healthcare needs today.  ACTIVITY:  You should plan to take it easy for the rest of today and you should NOT DRIVE or use heavy machinery until tomorrow (because of the sedation medicines used during the test).    FOLLOW UP: Our staff will call the number listed on your records the next business day following your procedure.  We will call around 7:15- 8:00 am to check on you and address any questions or concerns that you may have regarding the information given to you following your procedure. If we do not reach you, we will leave a message.     If any biopsies were taken you will be contacted by phone or by letter within the next 1-3 weeks.  Please call us at [phone] if you have not heard about the biopsies in 3 weeks.    SIGNATURES/CONFIDENTIALITY: You and/or your care partner have signed  paperwork which will be entered into your electronic medical record.  These signatures attest to the fact that that the information above on your After Visit Summary has been reviewed and is understood.  Full responsibility of the confidentiality of this discharge information lies with you and/or your care-partner.

## 2021-12-09 ENCOUNTER — Telehealth: Payer: Self-pay

## 2021-12-09 DIAGNOSIS — R1310 Dysphagia, unspecified: Secondary | ICD-10-CM

## 2021-12-09 NOTE — Telephone Encounter (Signed)
Per 12/07/21 procedure report - Recommend barium swallow with tablet to assess motility  Barium swallow study order in epic. Secure staff message sent to radiology scheduling to contact patient to set up appt.

## 2021-12-09 NOTE — Telephone Encounter (Signed)
  Follow up Call-     12/07/2021   10:39 AM 07/24/2021    8:35 AM  Call back number  Post procedure Call Back phone  # [phone]  Permission to leave phone message Yes Yes     Patient questions:  Do you have a fever, pain , or abdominal swelling? No. Pain Score  0 *  Have you tolerated food without any problems? Yes.    Have you been able to return to your normal activities? Yes.    Do you have any questions about your discharge instructions: Diet   No. Medications  No. Follow up visit  No.  Do you have questions or concerns about your Care? No.  Actions: * If pain score is 4 or above: No action needed, pain <4.

## 2021-12-09 NOTE — Telephone Encounter (Signed)
Appt scheduled for Monday, 12/16/21 at 10 am.

## 2021-12-12 DIAGNOSIS — H43813 Vitreous degeneration, bilateral: Secondary | ICD-10-CM | POA: Diagnosis not present

## 2021-12-12 DIAGNOSIS — G4733 Obstructive sleep apnea (adult) (pediatric): Secondary | ICD-10-CM | POA: Diagnosis not present

## 2021-12-12 DIAGNOSIS — E113293 Type 2 diabetes mellitus with mild nonproliferative diabetic retinopathy without macular edema, bilateral: Secondary | ICD-10-CM | POA: Diagnosis not present

## 2021-12-12 DIAGNOSIS — H35371 Puckering of macula, right eye: Secondary | ICD-10-CM | POA: Diagnosis not present

## 2021-12-12 DIAGNOSIS — H35351 Cystoid macular degeneration, right eye: Secondary | ICD-10-CM | POA: Diagnosis not present

## 2021-12-12 DIAGNOSIS — H35071 Retinal telangiectasis, right eye: Secondary | ICD-10-CM | POA: Diagnosis not present

## 2021-12-16 ENCOUNTER — Other Ambulatory Visit: Payer: Self-pay | Admitting: Gastroenterology

## 2021-12-16 ENCOUNTER — Ambulatory Visit (HOSPITAL_COMMUNITY)
Admission: RE | Admit: 2021-12-16 | Discharge: 2021-12-16 | Disposition: A | Discharge status: Home / Self Care | Payer: Medicare Other | Source: Ambulatory Visit | Attending: Gastroenterology | Admitting: Gastroenterology

## 2021-12-16 DIAGNOSIS — K224 Dyskinesia of esophagus: Secondary | ICD-10-CM | POA: Diagnosis not present

## 2021-12-16 DIAGNOSIS — R1310 Dysphagia, unspecified: Secondary | ICD-10-CM | POA: Insufficient documentation

## 2021-12-25 DIAGNOSIS — N179 Acute kidney failure, unspecified: Secondary | ICD-10-CM | POA: Diagnosis not present

## 2021-12-25 DIAGNOSIS — I129 Hypertensive chronic kidney disease with stage 1 through stage 4 chronic kidney disease, or unspecified chronic kidney disease: Secondary | ICD-10-CM | POA: Diagnosis not present

## 2021-12-25 DIAGNOSIS — N1832 Chronic kidney disease, stage 3b: Secondary | ICD-10-CM | POA: Diagnosis not present

## 2021-12-25 DIAGNOSIS — G301 Alzheimer's disease with late onset: Secondary | ICD-10-CM | POA: Diagnosis not present

## 2021-12-25 DIAGNOSIS — D631 Anemia in chronic kidney disease: Secondary | ICD-10-CM | POA: Diagnosis not present

## 2021-12-27 DIAGNOSIS — N1832 Chronic kidney disease, stage 3b: Secondary | ICD-10-CM | POA: Diagnosis not present

## 2022-01-05 DIAGNOSIS — I5032 Chronic diastolic (congestive) heart failure: Secondary | ICD-10-CM | POA: Diagnosis not present

## 2022-01-05 DIAGNOSIS — E785 Hyperlipidemia, unspecified: Secondary | ICD-10-CM | POA: Diagnosis not present

## 2022-01-05 DIAGNOSIS — I130 Hypertensive heart and chronic kidney disease with heart failure and stage 1 through stage 4 chronic kidney disease, or unspecified chronic kidney disease: Secondary | ICD-10-CM | POA: Diagnosis not present

## 2022-01-05 DIAGNOSIS — N1831 Chronic kidney disease, stage 3a: Secondary | ICD-10-CM | POA: Diagnosis not present

## 2022-01-16 DIAGNOSIS — R3915 Urgency of urination: Secondary | ICD-10-CM | POA: Diagnosis not present

## 2022-01-16 DIAGNOSIS — R3914 Feeling of incomplete bladder emptying: Secondary | ICD-10-CM | POA: Diagnosis not present

## 2022-01-17 ENCOUNTER — Other Ambulatory Visit (HOSPITAL_COMMUNITY): Payer: Self-pay | Admitting: *Deleted

## 2022-01-20 ENCOUNTER — Ambulatory Visit (HOSPITAL_COMMUNITY)
Admission: RE | Admit: 2022-01-20 | Discharge: 2022-01-20 | Disposition: A | Discharge status: Home / Self Care | Payer: Medicare Other | Source: Ambulatory Visit | Attending: Nephrology | Admitting: Nephrology

## 2022-01-20 VITALS — BP 103/80 | HR 84 | Temp 97.2°F | Resp 18

## 2022-01-20 DIAGNOSIS — D638 Anemia in other chronic diseases classified elsewhere: Secondary | ICD-10-CM

## 2022-01-20 LAB — POCT HEMOGLOBIN-HEMACUE: Hemoglobin: 8.1 g/dL — ABNORMAL LOW (ref 13.0–17.0)

## 2022-01-20 MED ORDER — EPOETIN ALFA-EPBX 10000 UNIT/ML IJ SOLN
INTRAMUSCULAR | Status: AC
  Administered 2022-01-20: 10000 [IU] via SUBCUTANEOUS
  Filled 2022-01-20: qty 1

## 2022-01-20 MED ORDER — EPOETIN ALFA-EPBX 10000 UNIT/ML IJ SOLN: 10000.0000 [IU] | INTRAMUSCULAR | Status: DC

## 2022-01-22 ENCOUNTER — Encounter: Payer: Self-pay | Admitting: Podiatry

## 2022-01-22 ENCOUNTER — Ambulatory Visit: Payer: Medicare Other | Admitting: Podiatry

## 2022-01-22 VITALS — BP 137/65

## 2022-01-22 DIAGNOSIS — L84 Corns and callosities: Secondary | ICD-10-CM | POA: Diagnosis not present

## 2022-01-22 DIAGNOSIS — B351 Tinea unguium: Secondary | ICD-10-CM | POA: Diagnosis not present

## 2022-01-22 DIAGNOSIS — M79674 Pain in right toe(s): Secondary | ICD-10-CM

## 2022-01-22 DIAGNOSIS — N183 Chronic kidney disease, stage 3 unspecified: Secondary | ICD-10-CM | POA: Diagnosis not present

## 2022-01-22 DIAGNOSIS — M79675 Pain in left toe(s): Secondary | ICD-10-CM

## 2022-01-22 DIAGNOSIS — E1122 Type 2 diabetes mellitus with diabetic chronic kidney disease: Secondary | ICD-10-CM | POA: Diagnosis not present

## 2022-01-22 NOTE — Progress Notes (Signed)
  Subjective:  Patient ID: Gerald [name], male    DOB: 10/05/1945,  MRN: [id number]  [name] presents to clinic today for at risk foot care. Pt has h/o NIDDM with chronic kidney disease and callus(es) b/l lower extremities and painful thick toenails that are difficult to trim. Painful toenails interfere with ambulation. Aggravating factors include wearing enclosed shoe gear. Pain is relieved with periodic professional debridement. Painful calluses are aggravated when weightbearing with and without shoegear. Pain is relieved with periodic professional debridement.  Chief Complaint  Patient presents with   Nail Problem    [facility] BS-do not check A1C-5.7 PCP-Dana Collins PCP VST-12/2021   New problem(s): None.   PCP is [name], DO.  No Known Allergies  Review of Systems: Negative except as noted in the HPI.  Objective:  Vitals:   01/22/22 1558  BP: 137/65   Gerald [name] is a pleasant 77 y.o. male WD, WN in NAD. AAO x 3.  Vascular Examination: CFT <3 seconds b/l LE. Faintly palpable DP pulse(s) RLE. Faintly palpable PT pulse(s) b/l LE. Diminished DP pulse(s) left lower extremity. Pedal hair absent. No pain with calf compression b/l. Lower extremity skin temperature gradient within normal limits. No edema noted b/l LE. No cyanosis or clubbing noted b/l LE.  Dermatological Examination: Pedal integument with normal turgor, texture and tone b/l LE. No open wounds b/l. No interdigital macerations b/l. Toenails 1-5 b/l elongated, thickened, discolored with subungual debris. +Tenderness with dorsal palpation of nailplates.   Hyperkeratotic lesion(s) submet head 1 left foot and submet head 5 b/l.  Musculoskeletal Examination: Muscle strength 5/5 to all lower extremity muscle groups bilaterally. Limb length discrepancy of left lower extremity. External build up on left shoe. Hammertoe deformity noted 2-5 b/l.  Neurological Examination: Protective sensation intact 5/5  intact bilaterally with 10g monofilament b/l. Vibratory sensation intact b/l.  Assessment/Plan: 1. Pain due to onychomycosis of toenails of both feet   2. Callus   3. Type 2 diabetes mellitus with stage 3 chronic kidney disease, without long-term current use of insulin, unspecified whether stage 3a or 3b CKD ([place])     No orders of the defined types were placed in this encounter.   -Consent given for treatment as described below: -Examined patient. -Continue supportive shoe gear daily. -Mycotic toenails 1-5 bilaterally were debrided in length and girth with sterile nail nippers and dremel without incident. -Callus(es) submet head 1 left foot and submet head 5 b/l pared utilizing sterile scalpel blade without complication or incident. Total number debrided =3. -Patient/POA to call should there be question/concern in the interim.   Return in about 3 months (around 04/23/2022).  Marzetta Board, DPM

## 2022-02-11 ENCOUNTER — Emergency Department (HOSPITAL_COMMUNITY)
Admission: EM | Admit: 2022-02-11 | Discharge: 2022-02-12 | Disposition: A | Discharge status: Home / Self Care | Payer: Medicare Other | Source: Home / Self Care | Attending: Emergency Medicine | Admitting: Emergency Medicine

## 2022-02-11 ENCOUNTER — Emergency Department (HOSPITAL_COMMUNITY): Payer: Medicare Other

## 2022-02-11 DIAGNOSIS — N319 Neuromuscular dysfunction of bladder, unspecified: Secondary | ICD-10-CM | POA: Diagnosis not present

## 2022-02-11 DIAGNOSIS — E11649 Type 2 diabetes mellitus with hypoglycemia without coma: Secondary | ICD-10-CM | POA: Diagnosis not present

## 2022-02-11 DIAGNOSIS — Z79899 Other long term (current) drug therapy: Secondary | ICD-10-CM | POA: Insufficient documentation

## 2022-02-11 DIAGNOSIS — G9341 Metabolic encephalopathy: Secondary | ICD-10-CM | POA: Diagnosis not present

## 2022-02-11 DIAGNOSIS — N3001 Acute cystitis with hematuria: Secondary | ICD-10-CM | POA: Diagnosis not present

## 2022-02-11 DIAGNOSIS — I5032 Chronic diastolic (congestive) heart failure: Secondary | ICD-10-CM | POA: Insufficient documentation

## 2022-02-11 DIAGNOSIS — E0869 Diabetes mellitus due to underlying condition with other specified complication: Secondary | ICD-10-CM | POA: Diagnosis not present

## 2022-02-11 DIAGNOSIS — R6889 Other general symptoms and signs: Secondary | ICD-10-CM | POA: Diagnosis not present

## 2022-02-11 DIAGNOSIS — R310 Gross hematuria: Secondary | ICD-10-CM | POA: Diagnosis not present

## 2022-02-11 DIAGNOSIS — E876 Hypokalemia: Secondary | ICD-10-CM | POA: Insufficient documentation

## 2022-02-11 DIAGNOSIS — N183 Chronic kidney disease, stage 3 unspecified: Secondary | ICD-10-CM | POA: Insufficient documentation

## 2022-02-11 DIAGNOSIS — B957 Other staphylococcus as the cause of diseases classified elsewhere: Secondary | ICD-10-CM | POA: Diagnosis not present

## 2022-02-11 DIAGNOSIS — I447 Left bundle-branch block, unspecified: Secondary | ICD-10-CM | POA: Insufficient documentation

## 2022-02-11 DIAGNOSIS — E119 Type 2 diabetes mellitus without complications: Secondary | ICD-10-CM | POA: Diagnosis not present

## 2022-02-11 DIAGNOSIS — R4182 Altered mental status, unspecified: Secondary | ICD-10-CM | POA: Diagnosis not present

## 2022-02-11 DIAGNOSIS — F0390 Unspecified dementia without behavioral disturbance: Secondary | ICD-10-CM | POA: Insufficient documentation

## 2022-02-11 DIAGNOSIS — R319 Hematuria, unspecified: Secondary | ICD-10-CM | POA: Diagnosis not present

## 2022-02-11 DIAGNOSIS — F32A Depression, unspecified: Secondary | ICD-10-CM | POA: Diagnosis not present

## 2022-02-11 DIAGNOSIS — N1832 Chronic kidney disease, stage 3b: Secondary | ICD-10-CM | POA: Diagnosis not present

## 2022-02-11 DIAGNOSIS — R824 Acetonuria: Secondary | ICD-10-CM | POA: Insufficient documentation

## 2022-02-11 DIAGNOSIS — N139 Obstructive and reflux uropathy, unspecified: Secondary | ICD-10-CM | POA: Diagnosis not present

## 2022-02-11 DIAGNOSIS — D638 Anemia in other chronic diseases classified elsewhere: Secondary | ICD-10-CM | POA: Diagnosis not present

## 2022-02-11 DIAGNOSIS — R410 Disorientation, unspecified: Secondary | ICD-10-CM | POA: Diagnosis not present

## 2022-02-11 DIAGNOSIS — K2100 Gastro-esophageal reflux disease with esophagitis, without bleeding: Secondary | ICD-10-CM | POA: Diagnosis not present

## 2022-02-11 DIAGNOSIS — R809 Proteinuria, unspecified: Secondary | ICD-10-CM | POA: Insufficient documentation

## 2022-02-11 DIAGNOSIS — R627 Adult failure to thrive: Secondary | ICD-10-CM | POA: Diagnosis not present

## 2022-02-11 DIAGNOSIS — D631 Anemia in chronic kidney disease: Secondary | ICD-10-CM | POA: Diagnosis not present

## 2022-02-11 DIAGNOSIS — F02818 Dementia in other diseases classified elsewhere, unspecified severity, with other behavioral disturbance: Secondary | ICD-10-CM | POA: Diagnosis not present

## 2022-02-11 DIAGNOSIS — Z743 Need for continuous supervision: Secondary | ICD-10-CM | POA: Diagnosis not present

## 2022-02-11 DIAGNOSIS — E1122 Type 2 diabetes mellitus with diabetic chronic kidney disease: Secondary | ICD-10-CM | POA: Insufficient documentation

## 2022-02-11 DIAGNOSIS — E785 Hyperlipidemia, unspecified: Secondary | ICD-10-CM | POA: Diagnosis not present

## 2022-02-11 DIAGNOSIS — G9340 Encephalopathy, unspecified: Secondary | ICD-10-CM | POA: Diagnosis not present

## 2022-02-11 DIAGNOSIS — R339 Retention of urine, unspecified: Secondary | ICD-10-CM | POA: Insufficient documentation

## 2022-02-11 DIAGNOSIS — R404 Transient alteration of awareness: Secondary | ICD-10-CM | POA: Diagnosis not present

## 2022-02-11 DIAGNOSIS — Z66 Do not resuscitate: Secondary | ICD-10-CM | POA: Diagnosis not present

## 2022-02-11 DIAGNOSIS — I130 Hypertensive heart and chronic kidney disease with heart failure and stage 1 through stage 4 chronic kidney disease, or unspecified chronic kidney disease: Secondary | ICD-10-CM | POA: Insufficient documentation

## 2022-02-11 DIAGNOSIS — N390 Urinary tract infection, site not specified: Secondary | ICD-10-CM | POA: Diagnosis not present

## 2022-02-11 DIAGNOSIS — I5031 Acute diastolic (congestive) heart failure: Secondary | ICD-10-CM | POA: Diagnosis not present

## 2022-02-11 DIAGNOSIS — E860 Dehydration: Secondary | ICD-10-CM | POA: Diagnosis not present

## 2022-02-11 DIAGNOSIS — Y846 Urinary catheterization as the cause of abnormal reaction of the patient, or of later complication, without mention of misadventure at the time of the procedure: Secondary | ICD-10-CM | POA: Diagnosis present

## 2022-02-11 DIAGNOSIS — E162 Hypoglycemia, unspecified: Secondary | ICD-10-CM | POA: Diagnosis not present

## 2022-02-11 DIAGNOSIS — E872 Acidosis, unspecified: Secondary | ICD-10-CM | POA: Diagnosis not present

## 2022-02-11 DIAGNOSIS — I10 Essential (primary) hypertension: Secondary | ICD-10-CM | POA: Diagnosis not present

## 2022-02-11 DIAGNOSIS — T83511A Infection and inflammatory reaction due to indwelling urethral catheter, initial encounter: Secondary | ICD-10-CM | POA: Diagnosis not present

## 2022-02-11 DIAGNOSIS — D649 Anemia, unspecified: Secondary | ICD-10-CM | POA: Diagnosis not present

## 2022-02-11 DIAGNOSIS — F0283 Dementia in other diseases classified elsewhere, unspecified severity, with mood disturbance: Secondary | ICD-10-CM | POA: Diagnosis not present

## 2022-02-11 DIAGNOSIS — K219 Gastro-esophageal reflux disease without esophagitis: Secondary | ICD-10-CM | POA: Diagnosis not present

## 2022-02-11 DIAGNOSIS — G309 Alzheimer's disease, unspecified: Secondary | ICD-10-CM | POA: Diagnosis not present

## 2022-02-11 DIAGNOSIS — N179 Acute kidney failure, unspecified: Secondary | ICD-10-CM | POA: Diagnosis not present

## 2022-02-11 DIAGNOSIS — R3914 Feeling of incomplete bladder emptying: Secondary | ICD-10-CM | POA: Diagnosis not present

## 2022-02-11 DIAGNOSIS — R9431 Abnormal electrocardiogram [ECG] [EKG]: Secondary | ICD-10-CM | POA: Diagnosis not present

## 2022-02-11 DIAGNOSIS — S0003XA Contusion of scalp, initial encounter: Secondary | ICD-10-CM | POA: Diagnosis not present

## 2022-02-11 LAB — CBC WITH DIFFERENTIAL/PLATELET
Abs Immature Granulocytes: 0.02 10*3/uL (ref 0.00–0.07)
Basophils Absolute: 0 10*3/uL (ref 0.0–0.1)
Basophils Relative: 0 %
Eosinophils Absolute: 0.1 10*3/uL (ref 0.0–0.5)
Eosinophils Relative: 1 %
HCT: 27.4 % — ABNORMAL LOW (ref 39.0–52.0)
Hemoglobin: 9.2 g/dL — ABNORMAL LOW (ref 13.0–17.0)
Immature Granulocytes: 0 %
Lymphocytes Relative: 22 %
Lymphs Abs: 1.1 10*3/uL (ref 0.7–4.0)
MCH: 29.7 pg (ref 26.0–34.0)
MCHC: 33.6 g/dL (ref 30.0–36.0)
MCV: 88.4 fL (ref 80.0–100.0)
Monocytes Absolute: 0.5 10*3/uL (ref 0.1–1.0)
Monocytes Relative: 9 %
Neutro Abs: 3.5 10*3/uL (ref 1.7–7.7)
Neutrophils Relative %: 68 %
Platelets: 135 10*3/uL — ABNORMAL LOW (ref 150–400)
RBC: 3.1 MIL/uL — ABNORMAL LOW (ref 4.22–5.81)
RDW: 15.6 % — ABNORMAL HIGH (ref 11.5–15.5)
WBC: 5.1 10*3/uL (ref 4.0–10.5)
nRBC: 0 % (ref 0.0–0.2)

## 2022-02-11 LAB — URINALYSIS, ROUTINE W REFLEX MICROSCOPIC
Bacteria, UA: NONE SEEN
Bilirubin Urine: NEGATIVE
Glucose, UA: NEGATIVE mg/dL
Hgb urine dipstick: NEGATIVE
Ketones, ur: 5 mg/dL — AB
Leukocytes,Ua: NEGATIVE
Nitrite: NEGATIVE
Protein, ur: 30 mg/dL — AB
Specific Gravity, Urine: 1.009 (ref 1.005–1.030)
pH: 8 (ref 5.0–8.0)

## 2022-02-11 LAB — BASIC METABOLIC PANEL
Anion gap: 11 (ref 5–15)
BUN: 23 mg/dL (ref 8–23)
CO2: 17 mmol/L — ABNORMAL LOW (ref 22–32)
Calcium: 9 mg/dL (ref 8.9–10.3)
Chloride: 113 mmol/L — ABNORMAL HIGH (ref 98–111)
Creatinine, Ser: 2.02 mg/dL — ABNORMAL HIGH (ref 0.61–1.24)
GFR, Estimated: 34 mL/min — ABNORMAL LOW (ref >60)
Glucose, Bld: 85 mg/dL (ref 70–99)
Potassium: 3.2 mmol/L — ABNORMAL LOW (ref 3.5–5.1)
Sodium: 141 mmol/L (ref 135–145)

## 2022-02-11 MED ORDER — POTASSIUM CHLORIDE CRYS ER 20 MEQ PO TBCR
20.0000 meq | EXTENDED_RELEASE_TABLET | Freq: Once | ORAL | Status: AC
  Administered 2022-02-11: 20 meq via ORAL
  Filled 2022-02-11: qty 1

## 2022-02-11 NOTE — ED Triage Notes (Signed)
Pt BIB EMS for urinary difficulty since last week. Pt has had problems urinating frequently. When pt urinates, family reports foul odor and UTI symptoms. Pt has hx of dementia. VSS at this time.

## 2022-02-11 NOTE — ED Provider Notes (Signed)
[place] [facility] Provider Note   CSN: [id number] Arrival date & time: 02/11/22  1906     History  Chief Complaint  Patient presents with   Urinary Retention    Gerald [name] is a 77 y.o. male with hyperlipidemia, hypertension, T2DM, stage III CKD, dementia, GERD, left bundle branch block, erectile dysfunction, chronic diastolic heart failure, BPH, history of urinary retention presents with urinary retention.   Wife at bedside provides additional history.  Reports difficulty urinating x 1 week. Pt has had problems urinating frequently. When pt urinates, wife reports foul odor and UTI symptoms. Pt has hx of dementia and so wife provides supplemental history. Denies f/c, abdominal pain, N/V/D/C. Per chart review, patient was admitted in September [id number] for metabolic encephalopathy found to have urinary obstruction and required Foley placement and initiation of flomax. Thought to be due to BPH.   HPI     Home Medications Prior to Admission medications   Medication Sig Start Date End Date Taking? Authorizing Provider  amLODipine (NORVASC) 10 MG tablet Take 10 mg by mouth in the morning.    [provider]  atorvastatin (LIPITOR) 10 MG tablet Take 10 mg by mouth at bedtime.    [provider]  buPROPion (WELLBUTRIN XL) 150 MG 24 hr tablet TAKE 1 TABLET (150 MG TOTAL) BY MOUTH EVERY MORNING. Patient taking differently: Take 450 mg by mouth in the morning. 01/16/15   Arfeen, Arlyce Harman, MD  doxazosin (CARDURA) 8 MG tablet Take 1 tablet (8 mg total) by mouth at bedtime. 09/16/21   Aline August, MD  ferrous sulfate 325 (65 FE) MG tablet Take 325 mg by mouth daily with breakfast.    [provider]  finasteride (PROSCAR) 5 MG tablet Take 5 mg by mouth daily. 10/02/21   [provider]  FLUoxetine (PROZAC) 40 MG capsule Take 1 capsule (40 mg total) by mouth daily. Patient taking differently: Take 40 mg by mouth in the  morning. 01/16/15   Arfeen, Arlyce Harman, MD  Melatonin 5 MG SUBL Place 5 mg under the tongue at bedtime as needed (sleep).     [provider]  memantine (NAMENDA) 10 MG tablet Take 1 tablet (10 mg total) by mouth 2 (two) times daily. 07/25/21 07/20/22  Alric Ran, MD  Multiple Vitamins-Minerals (CENTRUM SILVER 50+MEN) TABS Take 1 tablet by mouth daily with breakfast.    [provider]  MYRBETRIQ 25 MG TB24 tablet Take 25 mg by mouth daily. 01/16/22   [provider]  omeprazole (PRILOSEC) 40 MG capsule Take 1 capsule (40 mg total) by mouth daily. Patient taking differently: Take 40 mg by mouth See admin instructions. Take 40 mg by mouth mid-morning every day 01/12/13   Unk Pinto, MD  polyethylene glycol (MIRALAX / GLYCOLAX) 17 g packet Take 17 g by mouth daily as needed for moderate constipation. 09/16/21   Aline August, MD  QUEtiapine (SEROQUEL) 25 MG tablet Take 1 tablet (25 mg total) by mouth at bedtime. 12/05/21 11/30/22  Alric Ran, MD  rivastigmine (EXELON) 3 MG capsule Take 1 capsule (3 mg total) by mouth 2 (two) times daily. 07/25/21 07/20/22  Alric Ran, MD  senna-docusate (SENOKOT-S) 8.6-50 MG tablet Take 1 tablet by mouth 2 (two) times daily. 09/16/21   Aline August, MD      Allergies    Patient has no known allergies.    Review of Systems   Review of Systems Review of systems Negative for f/c.  A 10 point review of systems was performed and is negative unless otherwise reported in HPI.  Physical Exam Updated Vital Signs BP (!) 165/73 (BP Location: Right Arm)   Pulse 86   Temp 98 F (36.7 C) (Oral)   Resp 20   SpO2 100%  Physical Exam General: Normal appearing male, lying in bed.  HEENT: Sclera anicteric, MMM, trachea midline.  Cardiology: RRR, no murmurs/rubs/gallops. BL radial and DP pulses equal bilaterally.  Resp: Normal respiratory rate and effort. CTAB, no wheezes, rhonchi, crackles.  Abd: Soft, non-tender, non-distended. No  rebound tenderness or guarding.  GU: Normal appearing circumcised genitalia. No blood noted at urethral meatus. No TTP to penis, scrotum, or testicles. Testicles with normal lie and no masses palpated. MSK: No peripheral edema or signs of trauma. Extremities without deformity or TTP. No cyanosis or clubbing. Skin: warm, dry. No rashes or lesions. Back: No CVA tenderness Neuro: A&Ox3 (not to time), CNs II-XII grossly intact. MAEs. Sensation grossly intact.  Psych: Normal mood and affect.   ED Results / Procedures / Treatments   Labs (all labs ordered are listed, but only abnormal results are displayed) Labs Reviewed  URINALYSIS, ROUTINE W REFLEX MICROSCOPIC - Abnormal; Notable for the following components:      Result Value   Ketones, ur 5 (*)    Protein, ur 30 (*)    All other components within normal limits  BASIC METABOLIC PANEL - Abnormal; Notable for the following components:   Potassium 3.2 (*)    Chloride 113 (*)    CO2 17 (*)    Creatinine, Ser 2.02 (*)    GFR, Estimated 34 (*)    All other components within normal limits  CBC WITH DIFFERENTIAL/PLATELET - Abnormal; Notable for the following components:   RBC 3.10 (*)    Hemoglobin 9.2 (*)    HCT 27.4 (*)    RDW 15.6 (*)    Platelets 135 (*)    All other components within normal limits    EKG EKG Interpretation  Date/Time:  Tuesday February 11 2022 19:32:11 EST Ventricular Rate:  91 PR Interval:  163 QRS Duration: 172 QT Interval:  458 QTC Calculation: 564 R Axis:   -9 Text Interpretation: Sinus rhythm Left bundle branch block Confirmed by Cindee Lame [phone]) on 02/11/2022 7:58:09 PM  Radiology US RENAL  Result Date: 02/11/2022 CLINICAL DATA:  Urinary obstruction EXAM: RENAL / URINARY TRACT ULTRASOUND COMPLETE COMPARISON:  Ultrasound 09/14/2021 FINDINGS: Right Kidney: Renal measurements: None 0.2 54.8 x 6.7 cm = volume: 153.3 mL. Echogenicity within normal limits. No hydronephrosis. Small cysts measuring up to 5  mm, no imaging follow-up is recommended Left Kidney: Renal measurements: 9.4 x 4.9 x 6.3 cm = volume: 151.7 mL. Echogenicity within normal limits. No hydronephrosis. Cyst at the midpole measuring 3 cm, no imaging follow-up is recommended. Bladder: Appears normal for degree of bladder distention. Other: None. IMPRESSION: Negative for hydronephrosis. Electronically Signed   By: Donavan Foil M.D.   On: 02/11/2022 21:13    Procedures Procedures    Medications Ordered in ED Medications  potassium chloride SA (KLOR-CON M) CR tablet 20 mEq (20 mEq Oral Given 02/11/22 2234)    ED Course/ Medical Decision Making/ A&P                          Medical Decision Making Amount and/or Complexity of Data Reviewed Labs: ordered. Decision-making details documented in ED Course. Radiology: ordered. Decision-making details documented in  ED Course.  Risk Prescription drug management.    This patient presents to the ED for concern of urinary retention, this involves an extensive number of treatment options, and is a complaint that carries with it a high risk of complications and morbidity.   MDM:    Patient with acute urinary retention.  Has history of urinary tension in the past thought to be due to BPH.  He denies any dysuria or hematuria but also consider UTI will obtain a UA.  Patient does have dementia and is a poor historian.  Unclear how long this has been an issue for him.  Differential includes electrolyte derangements, renal injury, hydronephrosis/acute obstructive uropathy.  Will obtain renal ultrasound as well to evaluate.  Patient is otherwise hemodynamically stable and well-appearing.   Clinical Course as of 02/12/22 0018  Tue Feb 11, 2022  2122 US RENAL Bladder:  Appears normal for degree of bladder distention.  Other:  None.  IMPRESSION: Negative for hydronephrosis.   [HN]  2201 Urinalysis, Routine w reflex microscopic -Urine, Clean Catch(!) Mild ketonuria, mild proteinuria,  no UTI or hematuria [HN]  2202 Creatinine(!): 2.02 C/w prior values [HN]  2203 Potassium(!): 3.2 Mild hypokalemia, repelted [HN]  2203 CO2(!): 17 Mild NAGMA, consider chronic kidney disease or AKI but no apaprent AKI on labs [HN]  2203 Hemoglobin(!): 9.2 C/w baseline [HN]  2204 WBC: 5.1 No leukocytosis [HN]  2204 Platelets(!): 135 C/w prior values [HN]  2349 No renal injury acutely on labs. Mild hypokalemia repleted. Wife at bedside states patient has been [place] with foley before. Patient attempting to urinate and cannot. Will obtain PVR and likely place foley for DC. [HN]  Wed Feb 12, 2022  0015 Foley placed, given [facility] info to call and f/u within 1-2 weeks [HN]  0016 Patient with history of dementia.  Wife at bedside states that he sundown's.  Wife states that she works and cannot care for him all the time.  I asked wife if she has considered placement for him and she states that he is mostly able to care for himself still at home and that she has not had anyone contact her about facility.  I explained to her that it is her responsibility or the patient's responsibility to make contact with social work or with facilities.  I offered her social work Midwife but she states that it is tired and she would like to go home to sleep.  I will discharge with social resources. [HN]    Clinical Course User Index [HN] Audley Hose, MD    Labs: I Ordered, and personally interpreted labs.  The pertinent results include:  those listed above  Imaging Studies ordered: I ordered imaging studies including US renal I independently visualized and interpreted imaging. I agree with the radiologist interpretation  Additional history obtained from chart review.   Reevaluation: After the interventions noted above, I reevaluated the patient and found that they have :improved  Social Determinants of Health: Patient lives independently with his wife.   Disposition:  DC with foley and urology  f/u   Co morbidities that complicate the patient evaluation  Past Medical History:  Diagnosis Date   Abnormality of gait 10/05/2014   Anemia    Anxiety    Bilateral edema of lower extremity    LE Dopplers 4/22 - no thrombus or thrombophelbitis, no reflux   Blood transfusion without reported diagnosis    with surgery 2012   Cataract    removed both eyes  CHF (congestive heart failure) ([name])    Chronic kidney disease    Colon polyp    DDD (degenerative disc disease)    Depression    DJD (degenerative joint disease)    DOE (dyspnea on exertion)    Echo 04 /22/2014 - normal EF, mild LVH, grade 1 Diastolic Dysfunction; Lexiscan Myoview 04/28/12 - no ishcemia or infarction, LBBB septal wall motion; EF ~60%   Fatigue    Fatigue    pt states he gets tired very quickly   GERD (gastroesophageal reflux disease)    H/O iron deficiency anemia    with  hemmorrhoids   Hyperlipidemia    Hypertension    Left bundle branch block     chronic, negative Myoview a normal echo as noted above    Osteoarthritis    with bilateral hip surgeries and multiple complication, with the most recent  surgery in January 2012   Scrotal edema    Type 2 diabetes mellitus without complications ([place]) [id number]   Vitamin D deficiency      Medicines Meds ordered this encounter  Medications   potassium chloride SA (KLOR-CON M) CR tablet 20 mEq    I have reviewed the patients home medicines and have made adjustments as needed  Problem List / ED Course: Problem List Items Addressed This Visit   None Visit Diagnoses     Urinary retention    -  Primary                   This note was created using dictation software, which may contain spelling or grammatical errors.    Audley Hose, MD 02/26/22 [phone]

## 2022-02-12 ENCOUNTER — Telehealth: Payer: Self-pay | Admitting: Neurology

## 2022-02-12 DIAGNOSIS — R3914 Feeling of incomplete bladder emptying: Secondary | ICD-10-CM | POA: Diagnosis not present

## 2022-02-12 DIAGNOSIS — R310 Gross hematuria: Secondary | ICD-10-CM | POA: Diagnosis not present

## 2022-02-12 NOTE — Discharge Instructions (Addendum)
Thank you for coming to [facility] Emergency Department. You were seen for urinary retention. We did an exam, labs, and imaging, and these showed urinary retention for which a foley was placed.  Please follow up with urology Dr. Louis Meckel within 1 week. You can call [facility] [phone] tomorrow to make an appointment.   Do not hesitate to return to the ED or call 911 if you experience: -Worsening symptoms -Bag not filling -Lightheadedness, passing out -Fevers/chills -Anything else that concerns you

## 2022-02-12 NOTE — Telephone Encounter (Signed)
Patient is in the ED and the ED will take care of him, the ED can contact me if needed.

## 2022-02-12 NOTE — Telephone Encounter (Signed)
Pt's wife, Lucia Bitter took pt to the ED [facility]) due to complaining stomach hurting. Pt is talking to people that are not there, keep repeating hisself. Would like a call from the nurse to discuss what to do.

## 2022-02-13 ENCOUNTER — Other Ambulatory Visit: Payer: Self-pay

## 2022-02-13 ENCOUNTER — Inpatient Hospital Stay (HOSPITAL_COMMUNITY)
Admission: EM | Admit: 2022-02-13 | Discharge: 2022-02-18 | DRG: 698 | Disposition: A | Discharge status: Skilled Nursing Facility | Payer: Medicare Other | Attending: Internal Medicine | Admitting: Internal Medicine

## 2022-02-13 DIAGNOSIS — E876 Hypokalemia: Secondary | ICD-10-CM | POA: Diagnosis not present

## 2022-02-13 DIAGNOSIS — F32A Depression, unspecified: Secondary | ICD-10-CM | POA: Diagnosis present

## 2022-02-13 DIAGNOSIS — G4733 Obstructive sleep apnea (adult) (pediatric): Secondary | ICD-10-CM | POA: Diagnosis present

## 2022-02-13 DIAGNOSIS — E872 Acidosis, unspecified: Secondary | ICD-10-CM | POA: Diagnosis present

## 2022-02-13 DIAGNOSIS — F0283 Dementia in other diseases classified elsewhere, unspecified severity, with mood disturbance: Secondary | ICD-10-CM | POA: Diagnosis present

## 2022-02-13 DIAGNOSIS — Z86010 Personal history of colonic polyps: Secondary | ICD-10-CM

## 2022-02-13 DIAGNOSIS — G309 Alzheimer's disease, unspecified: Secondary | ICD-10-CM | POA: Diagnosis present

## 2022-02-13 DIAGNOSIS — K219 Gastro-esophageal reflux disease without esophagitis: Secondary | ICD-10-CM | POA: Diagnosis present

## 2022-02-13 DIAGNOSIS — N1832 Chronic kidney disease, stage 3b: Secondary | ICD-10-CM | POA: Diagnosis present

## 2022-02-13 DIAGNOSIS — Y846 Urinary catheterization as the cause of abnormal reaction of the patient, or of later complication, without mention of misadventure at the time of the procedure: Secondary | ICD-10-CM | POA: Diagnosis present

## 2022-02-13 DIAGNOSIS — Z87891 Personal history of nicotine dependence: Secondary | ICD-10-CM

## 2022-02-13 DIAGNOSIS — Z8249 Family history of ischemic heart disease and other diseases of the circulatory system: Secondary | ICD-10-CM

## 2022-02-13 DIAGNOSIS — Z66 Do not resuscitate: Secondary | ICD-10-CM | POA: Diagnosis present

## 2022-02-13 DIAGNOSIS — E162 Hypoglycemia, unspecified: Secondary | ICD-10-CM | POA: Diagnosis present

## 2022-02-13 DIAGNOSIS — E1122 Type 2 diabetes mellitus with diabetic chronic kidney disease: Secondary | ICD-10-CM | POA: Diagnosis present

## 2022-02-13 DIAGNOSIS — G9341 Metabolic encephalopathy: Secondary | ICD-10-CM | POA: Diagnosis present

## 2022-02-13 DIAGNOSIS — N529 Male erectile dysfunction, unspecified: Secondary | ICD-10-CM | POA: Diagnosis present

## 2022-02-13 DIAGNOSIS — N401 Enlarged prostate with lower urinary tract symptoms: Secondary | ICD-10-CM | POA: Diagnosis present

## 2022-02-13 DIAGNOSIS — T83511A Infection and inflammatory reaction due to indwelling urethral catheter, initial encounter: Principal | ICD-10-CM | POA: Diagnosis present

## 2022-02-13 DIAGNOSIS — R9431 Abnormal electrocardiogram [ECG] [EKG]: Secondary | ICD-10-CM | POA: Diagnosis present

## 2022-02-13 DIAGNOSIS — I10 Essential (primary) hypertension: Secondary | ICD-10-CM | POA: Diagnosis present

## 2022-02-13 DIAGNOSIS — B957 Other staphylococcus as the cause of diseases classified elsewhere: Secondary | ICD-10-CM | POA: Diagnosis present

## 2022-02-13 DIAGNOSIS — S0003XA Contusion of scalp, initial encounter: Secondary | ICD-10-CM | POA: Diagnosis present

## 2022-02-13 DIAGNOSIS — D631 Anemia in chronic kidney disease: Secondary | ICD-10-CM | POA: Diagnosis present

## 2022-02-13 DIAGNOSIS — Z833 Family history of diabetes mellitus: Secondary | ICD-10-CM

## 2022-02-13 DIAGNOSIS — I447 Left bundle-branch block, unspecified: Secondary | ICD-10-CM | POA: Diagnosis present

## 2022-02-13 DIAGNOSIS — R627 Adult failure to thrive: Secondary | ICD-10-CM | POA: Diagnosis present

## 2022-02-13 DIAGNOSIS — E785 Hyperlipidemia, unspecified: Secondary | ICD-10-CM | POA: Diagnosis present

## 2022-02-13 DIAGNOSIS — Z79899 Other long term (current) drug therapy: Secondary | ICD-10-CM

## 2022-02-13 DIAGNOSIS — R4182 Altered mental status, unspecified: Principal | ICD-10-CM

## 2022-02-13 DIAGNOSIS — R338 Other retention of urine: Secondary | ICD-10-CM | POA: Diagnosis present

## 2022-02-13 DIAGNOSIS — E11649 Type 2 diabetes mellitus with hypoglycemia without coma: Secondary | ICD-10-CM | POA: Diagnosis present

## 2022-02-13 DIAGNOSIS — N3001 Acute cystitis with hematuria: Secondary | ICD-10-CM | POA: Diagnosis present

## 2022-02-13 DIAGNOSIS — E860 Dehydration: Secondary | ICD-10-CM | POA: Diagnosis present

## 2022-02-13 DIAGNOSIS — I5032 Chronic diastolic (congestive) heart failure: Secondary | ICD-10-CM | POA: Diagnosis present

## 2022-02-13 DIAGNOSIS — I130 Hypertensive heart and chronic kidney disease with heart failure and stage 1 through stage 4 chronic kidney disease, or unspecified chronic kidney disease: Secondary | ICD-10-CM | POA: Diagnosis present

## 2022-02-13 DIAGNOSIS — E119 Type 2 diabetes mellitus without complications: Secondary | ICD-10-CM

## 2022-02-13 DIAGNOSIS — D638 Anemia in other chronic diseases classified elsewhere: Secondary | ICD-10-CM | POA: Diagnosis present

## 2022-02-13 DIAGNOSIS — N179 Acute kidney failure, unspecified: Secondary | ICD-10-CM | POA: Diagnosis present

## 2022-02-13 DIAGNOSIS — N3000 Acute cystitis without hematuria: Secondary | ICD-10-CM | POA: Diagnosis present

## 2022-02-13 DIAGNOSIS — N400 Enlarged prostate without lower urinary tract symptoms: Secondary | ICD-10-CM | POA: Diagnosis present

## 2022-02-13 NOTE — ED Provider Notes (Signed)
Gerald [name]   CSN: [id number] Arrival date & time: 02/13/22  2212     History  Chief Complaint  Patient presents with   Agitation    Gerald [name] is a 77 y.o. male who presents with via EMS from home with concern for decline in mental status and increasing aggression.  Patient speaking nonsensically at time of my evaluation.  Level 5 caveat due to underlying dementia and altered mental status.  Collateral history was obtained from the patient's wife Gerald [name] via phone by this provider who states that this week his mental status is been declining.  At baseline he is able to perform ADLs, recognizes family and is oriented to place.  In the last week he has had trouble with urinary retention and was seen in [place] where Foley catheter was placed.  Seen at [facility] urology yesterday where catheter was replaced secondary to clogging of initial catheter by blood clots.  States that today patient has been significantly altered, aggressive, walker and has clothing on, speaking nonsensically and not recognizing family. Hematuria at home today as well in foley bag.   Patient family is seeking placement in memory care center with social work at this time.  Feels that they are unable to care for him at home as he lives alone with his elderly wife.  I personally read his medical record.  History of type 2 diabetes, GERD, Alzheimer's, obstructive sleep apnea.  No anticoagulation.  HPI     Home Medications Prior to Admission medications   Medication Sig Start Date End Date Taking? Authorizing Provider  amLODipine (NORVASC) 10 MG tablet Take 10 mg by mouth in the morning.    [provider]  atorvastatin (LIPITOR) 10 MG tablet Take 10 mg by mouth at bedtime.    [provider]  buPROPion (WELLBUTRIN XL) 150 MG 24 hr tablet TAKE 1 TABLET (150 MG TOTAL) BY MOUTH EVERY MORNING. Patient taking differently: Take 450 mg by  mouth in the morning. 01/16/15   Arfeen, Arlyce Harman, MD  doxazosin (CARDURA) 8 MG tablet Take 1 tablet (8 mg total) by mouth at bedtime. 09/16/21   Aline August, MD  ferrous sulfate 325 (65 FE) MG tablet Take 325 mg by mouth daily with breakfast.    [provider]  finasteride (PROSCAR) 5 MG tablet Take 5 mg by mouth daily. 10/02/21   [provider]  FLUoxetine (PROZAC) 40 MG capsule Take 1 capsule (40 mg total) by mouth daily. Patient taking differently: Take 40 mg by mouth in the morning. 01/16/15   Arfeen, Arlyce Harman, MD  Melatonin 5 MG SUBL Place 5 mg under the tongue at bedtime as needed (sleep).     [provider]  memantine (NAMENDA) 10 MG tablet Take 1 tablet (10 mg total) by mouth 2 (two) times daily. 07/25/21 07/20/22  Alric Ran, MD  Multiple Vitamins-Minerals (CENTRUM SILVER 50+MEN) TABS Take 1 tablet by mouth daily with breakfast.    [provider]  MYRBETRIQ 25 MG TB24 tablet Take 25 mg by mouth daily. 01/16/22   [provider]  omeprazole (PRILOSEC) 40 MG capsule Take 1 capsule (40 mg total) by mouth daily. Patient taking differently: Take 40 mg by mouth See admin instructions. Take 40 mg by mouth mid-morning every day 01/12/13   Unk Pinto, MD  polyethylene glycol (MIRALAX / GLYCOLAX) 17 g packet Take 17 g by mouth daily as needed for moderate constipation. 09/16/21  Aline August, MD  QUEtiapine (SEROQUEL) 25 MG tablet Take 1 tablet (25 mg total) by mouth at bedtime. 12/05/21 11/30/22  Alric Ran, MD  rivastigmine (EXELON) 3 MG capsule Take 1 capsule (3 mg total) by mouth 2 (two) times daily. 07/25/21 07/20/22  Alric Ran, MD  senna-docusate (SENOKOT-S) 8.6-50 MG tablet Take 1 tablet by mouth 2 (two) times daily. 09/16/21   Aline August, MD      Allergies    Patient has no known allergies.    Review of Systems   Review of Systems  Unable to perform ROS: Dementia    Physical Exam Updated Vital Signs BP (!) 152/63 (BP  Location: Right Arm)   Pulse 81   Temp 98.5 F (36.9 C) (Axillary)   Resp 20   SpO2 100%  Physical Exam Vitals and nursing [name].  Constitutional:      Appearance: He is not ill-appearing or toxic-appearing.  HENT:     Head: Normocephalic and atraumatic.     Nose: Nose normal.     Mouth/Throat:     Mouth: Mucous membranes are moist.     Pharynx: No oropharyngeal exudate or posterior oropharyngeal erythema.  Eyes:     General:        Right eye: No discharge.        Left eye: No discharge.     Extraocular Movements: Extraocular movements intact.     Conjunctiva/sclera: Conjunctivae normal.     Pupils: Pupils are equal, round, and reactive to light.  Cardiovascular:     Rate and Rhythm: Normal rate and regular rhythm.     Pulses: Normal pulses.     Heart sounds: Normal heart sounds. No murmur heard. Pulmonary:     Effort: Pulmonary effort is normal. No respiratory distress.     Breath sounds: Normal breath sounds. No wheezing or rales.  Abdominal:     General: Bowel sounds are normal. There is no distension.     Palpations: Abdomen is soft.     Tenderness: There is no abdominal tenderness. There is no guarding or rebound.  Genitourinary:    Comments: Foley catheter in place, scan urine in bag.Bladder scan by RN with < 200cc in bladder.  Musculoskeletal:        General: No deformity.     Cervical back: Neck supple.     Right lower leg: No edema.     Left lower leg: No edema.  Skin:    General: Skin is warm and dry.     Capillary Refill: Capillary refill takes less than 2 seconds.  Neurological:     General: No focal deficit present.     Mental Status: He is alert and oriented to person, place, and time. Mental status is at baseline.     GCS: GCS eye subscore is 4. GCS verbal subscore is 3. GCS motor subscore is 5.     Comments: Moving all extremities spontaneously and without difficulty.   Psychiatric:        Mood and Affect: Mood normal.     ED Results /  Procedures / Treatments   Labs (all labs ordered are listed, but only abnormal results are displayed) Labs Reviewed  CBC WITH DIFFERENTIAL/PLATELET - Abnormal; Notable for the following components:      Result Value   RBC 2.86 (*)    Hemoglobin 8.6 (*)    HCT 26.0 (*)    RDW 16.0 (*)    Platelets 117 (*)    All other  components within normal limits  COMPREHENSIVE METABOLIC PANEL - Abnormal; Notable for the following components:   Chloride 116 (*)    CO2 17 (*)    Glucose, Bld 54 (*)    BUN 31 (*)    Creatinine, Ser 2.30 (*)    Total Protein 6.3 (*)    Albumin 3.3 (*)    GFR, Estimated 29 (*)    All other components within normal limits  URINALYSIS, ROUTINE W REFLEX MICROSCOPIC - Abnormal; Notable for the following components:   APPearance CLOUDY (*)    Hgb urine dipstick LARGE (*)    Ketones, ur 20 (*)    Protein, ur 100 (*)    Leukocytes,Ua LARGE (*)    Bacteria, UA FEW (*)    All other components within normal limits  URINE CULTURE  LIPASE, BLOOD    EKG EKG Interpretation  Date/Time:  Thursday February 13 2022 22:26:28 EST Ventricular Rate:  88 PR Interval:  163 QRS Duration: 169 QT Interval:  422 QTC Calculation: 511 R Axis:   20 Text Interpretation: Sinus rhythm Left bundle branch block No significant change since last tracing Confirmed by Orpah Greek [phone]) on 02/14/2022 1:50:48 AM  Radiology CT ABDOMEN PELVIS WO CONTRAST  Result Date: 02/14/2022 CLINICAL DATA:  Microscopic hematuria EXAM: CT ABDOMEN AND PELVIS WITHOUT CONTRAST TECHNIQUE: Multidetector CT imaging of the abdomen and pelvis was performed following the standard protocol without IV contrast. RADIATION DOSE REDUCTION: This exam was performed according to the departmental dose-optimization program which includes automated exposure control, adjustment of the mA and/or kV according to patient size and/or use of iterative reconstruction technique. COMPARISON:  03/06/2010.  Ultrasound 02/11/2022  FINDINGS: Lower chest: No acute abnormality. Coronary artery and aortic calcifications. Hepatobiliary: No focal hepatic abnormality. Gallbladder unremarkable. Pancreas: No focal abnormality or ductal dilatation. Spleen: No focal abnormality.  Normal size. Adrenals/Urinary Tract: 2.2 cm left parapelvic cyst. No follow-up imaging recommended. No renal or ureteral stones. No hydronephrosis. Adrenal glands unremarkable. Foley catheter in the bladder which is mildly distended. Stomach/Bowel: Stomach, large and small bowel grossly unremarkable. Vascular/Lymphatic: Diffuse aortoiliac atherosclerosis. No evidence of aneurysm or adenopathy. Reproductive: No visible focal abnormality. Other: No free fluid or free air. 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 Advanced degenerative changes in the lumbar spine. No acute bony abnormality. IMPRESSION: No renal or ureteral stones.  No hydronephrosis. Aortoiliac atherosclerosis, coronary artery disease. 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 No acute findings. Electronically Signed   By: Rolm Baptise M.D.   On: 02/14/2022 01:05    Procedures Procedures    Medications Ordered in ED Medications  lactated ringers bolus 1,000 mL (1,000 mLs Intravenous New Bag/Given 02/14/22 0150)  piperacillin-tazobactam (ZOSYN) IVPB 3.375 g (0 g Intravenous Stopped 02/14/22 0149)    ED Course/ Medical Decision Making/ A&P Clinical Course as of 02/14/22 0216  Fri Feb 14, 2022  0213 Consult to hospitalist Dr. Velia Meyer who is agreeable to admitting the patient to his service.  Appreciate his collaboration in the care of this patient. [RS]    Clinical Course User Index [RS] Faithlyn Recktenwald, Gypsy Balsam, PA-C                             Medical Decision Making 77 year old male who presents with decline in mental status. Hx of dementia.  HTN on intake, VS otherwise normal. Cardiopulmonary exam is normal, abdominal  exam is benign. Moving all extremities spontaneously and without difficulty.  The differential diagnosis for AMS is extensive and includes, but is not limited to:  Drug overdose - opioids, alcohol, sedatives, antipsychotics, drug withdrawal, others Metabolic: hypoxia, hypoglycemia, hyperglycemia, hypercalcemia, hypernatremia, hyponatremia, uremia, hepatic encephalopathy, hypothyroidism, hyperthyroidism, vitamin B12 or thiamine deficiency, carbon monoxide poisoning, Trent's disease, Lactic acidosis, DKA/HHOS Infectious: meningitis, encephalitis, bacteremia/sepsis, urinary tract infection, pneumonia, neurosyphilis Structural: Space-occupying lesion, (brain tumor, subdural hematoma, hydrocephalus,) Vascular: stroke, subarachnoid hemorrhage, coronary ischemia, hypertensive encephalopathy, CNS vasculitis, thrombotic thrombocytopenic purpura, disseminated intravascular coagulation, hyperviscosity Psychiatric: Schizophrenia, depression; Other: Seizure, hypothermia, heat stroke, ICU psychosis, dementia -"sundowning."      Amount and/or Complexity of Data Reviewed Labs: ordered.    Details: CBC without leukocytosis, anemia with hemoglobin 8.6 near patient's baseline of 9.  CMP with AKI with creatinine of 2.3 increased patient baseline 1.7.  Urine cloudy with large hemoglobin, ketonuria, proteinuria, large leukocytes, greater than 50 WBCs and few bacteria.  Specimen drawn from bladder via port on Foley catheter placed yesterday on 02/12/2022, urine culture pending.  Radiology: ordered.    Details: CT abdomen and pelvis without acute finding, visualized by this provider..   Risk Prescription drug management. Decision regarding hospitalization.    Overall patient's presentation most consistent with AKI and question of urinary tract infection contributing to decline in mental status from baseline.  Do feel patient would benefit from mission to the hospital at this time.  TOC consult also placed per  family request for assistance with memory care placement following admission.  Consult to hospitalist pending.  Consult to hospitalist as above.  Patient's wife Gerald [name] informed of disposition plan via phone by this provider.  She voiced understanding of the patient's medical evaluation and treatment plan. Each of their questions answered to their expressed satisfaction.  She is amenable to plan for admission at this time.   This chart was dictated using voice recognition software, Dragon. Despite the best efforts of this provider to proofread and correct errors, errors may still occur which can change documentation meaning.  Final Clinical Impression(s) / ED Diagnoses Final diagnoses:  Altered mental status, unspecified altered mental status type  AKI (acute kidney injury) [facility])    Rx / DC Orders ED Discharge Orders     None         Emeline Darling, PA-C 02/14/22 0216    Orpah Greek, MD 02/14/22 [phone]

## 2022-02-13 NOTE — Telephone Encounter (Signed)
Pt wife is calling. Stated pt is driving her crazy and she is trying to get pt placed. Stated he is seeing people and yelling at her and them. Stated pt is playing with the catheter and pulling on it. She wants to know if Dr. April Manson can prescribe something that will calm him down because.

## 2022-02-13 NOTE — Telephone Encounter (Signed)
Spoke with spouse, advised them to give extra dose of Seroquel if patient is agitation, if the additional dose is not enough to help him, she can always call EMS.  Wife will drop some paperwork to complete. This is patient to find a placement for memory care.

## 2022-02-13 NOTE — Telephone Encounter (Signed)
Pt's wife called needing to speak to the Provider regarding the pt's hallucinations and speaking to himself and also pulling out his catheter. Please advise.

## 2022-02-13 NOTE — ED Triage Notes (Signed)
Pt arrives to ED from home with c/o increased agitation/confusion/hallucination. Pt w hx of dementia, family reports limited means of taking care of pt. Pt with foley from [facility]. Pt reported to be agitated with EMS

## 2022-02-13 NOTE — ED Notes (Signed)
Pt bladder scanned per orders, > 173m found in bladder. Pt watching tv, no acute distress noted in pt. Vitals stable. Bed near nurses' station for safety. Call bell in reach.

## 2022-02-14 ENCOUNTER — Inpatient Hospital Stay (HOSPITAL_COMMUNITY): Payer: Medicare Other

## 2022-02-14 ENCOUNTER — Emergency Department (HOSPITAL_COMMUNITY): Payer: Medicare Other

## 2022-02-14 ENCOUNTER — Encounter (HOSPITAL_COMMUNITY): Payer: Self-pay | Admitting: Internal Medicine

## 2022-02-14 DIAGNOSIS — E860 Dehydration: Secondary | ICD-10-CM | POA: Diagnosis present

## 2022-02-14 DIAGNOSIS — N1832 Chronic kidney disease, stage 3b: Secondary | ICD-10-CM | POA: Diagnosis present

## 2022-02-14 DIAGNOSIS — N3001 Acute cystitis with hematuria: Secondary | ICD-10-CM

## 2022-02-14 DIAGNOSIS — D638 Anemia in other chronic diseases classified elsewhere: Secondary | ICD-10-CM

## 2022-02-14 DIAGNOSIS — G9340 Encephalopathy, unspecified: Secondary | ICD-10-CM | POA: Diagnosis present

## 2022-02-14 DIAGNOSIS — R4182 Altered mental status, unspecified: Secondary | ICD-10-CM | POA: Diagnosis not present

## 2022-02-14 DIAGNOSIS — Z79899 Other long term (current) drug therapy: Secondary | ICD-10-CM | POA: Diagnosis not present

## 2022-02-14 DIAGNOSIS — G9341 Metabolic encephalopathy: Secondary | ICD-10-CM

## 2022-02-14 DIAGNOSIS — B957 Other staphylococcus as the cause of diseases classified elsewhere: Secondary | ICD-10-CM | POA: Diagnosis present

## 2022-02-14 DIAGNOSIS — E162 Hypoglycemia, unspecified: Secondary | ICD-10-CM

## 2022-02-14 DIAGNOSIS — N179 Acute kidney failure, unspecified: Secondary | ICD-10-CM

## 2022-02-14 DIAGNOSIS — D631 Anemia in chronic kidney disease: Secondary | ICD-10-CM | POA: Diagnosis present

## 2022-02-14 DIAGNOSIS — E872 Acidosis, unspecified: Secondary | ICD-10-CM | POA: Diagnosis present

## 2022-02-14 DIAGNOSIS — E11649 Type 2 diabetes mellitus with hypoglycemia without coma: Secondary | ICD-10-CM | POA: Diagnosis present

## 2022-02-14 DIAGNOSIS — S0003XA Contusion of scalp, initial encounter: Secondary | ICD-10-CM | POA: Diagnosis present

## 2022-02-14 DIAGNOSIS — Z66 Do not resuscitate: Secondary | ICD-10-CM | POA: Diagnosis present

## 2022-02-14 DIAGNOSIS — K219 Gastro-esophageal reflux disease without esophagitis: Secondary | ICD-10-CM

## 2022-02-14 DIAGNOSIS — E1122 Type 2 diabetes mellitus with diabetic chronic kidney disease: Secondary | ICD-10-CM | POA: Diagnosis present

## 2022-02-14 DIAGNOSIS — I5032 Chronic diastolic (congestive) heart failure: Secondary | ICD-10-CM | POA: Diagnosis present

## 2022-02-14 DIAGNOSIS — R9431 Abnormal electrocardiogram [ECG] [EKG]: Secondary | ICD-10-CM | POA: Diagnosis present

## 2022-02-14 DIAGNOSIS — T83511A Infection and inflammatory reaction due to indwelling urethral catheter, initial encounter: Secondary | ICD-10-CM | POA: Diagnosis present

## 2022-02-14 DIAGNOSIS — N3000 Acute cystitis without hematuria: Secondary | ICD-10-CM | POA: Diagnosis present

## 2022-02-14 DIAGNOSIS — I10 Essential (primary) hypertension: Secondary | ICD-10-CM

## 2022-02-14 DIAGNOSIS — G309 Alzheimer's disease, unspecified: Secondary | ICD-10-CM | POA: Diagnosis present

## 2022-02-14 DIAGNOSIS — I130 Hypertensive heart and chronic kidney disease with heart failure and stage 1 through stage 4 chronic kidney disease, or unspecified chronic kidney disease: Secondary | ICD-10-CM | POA: Diagnosis present

## 2022-02-14 DIAGNOSIS — Y846 Urinary catheterization as the cause of abnormal reaction of the patient, or of later complication, without mention of misadventure at the time of the procedure: Secondary | ICD-10-CM | POA: Diagnosis present

## 2022-02-14 DIAGNOSIS — F32A Depression, unspecified: Secondary | ICD-10-CM

## 2022-02-14 DIAGNOSIS — E876 Hypokalemia: Secondary | ICD-10-CM | POA: Diagnosis not present

## 2022-02-14 DIAGNOSIS — R627 Adult failure to thrive: Secondary | ICD-10-CM | POA: Diagnosis present

## 2022-02-14 DIAGNOSIS — E119 Type 2 diabetes mellitus without complications: Secondary | ICD-10-CM

## 2022-02-14 DIAGNOSIS — F0283 Dementia in other diseases classified elsewhere, unspecified severity, with mood disturbance: Secondary | ICD-10-CM | POA: Diagnosis present

## 2022-02-14 DIAGNOSIS — E785 Hyperlipidemia, unspecified: Secondary | ICD-10-CM | POA: Diagnosis present

## 2022-02-14 LAB — URINALYSIS, ROUTINE W REFLEX MICROSCOPIC
Bilirubin Urine: NEGATIVE
Glucose, UA: NEGATIVE mg/dL
Ketones, ur: 20 mg/dL — AB
Nitrite: NEGATIVE
Protein, ur: 100 mg/dL — AB
RBC / HPF: >50 RBC/hpf (ref 0–5)
Specific Gravity, Urine: 1.013 (ref 1.005–1.030)
WBC, UA: >50 WBC/hpf (ref 0–5)
pH: 5 (ref 5.0–8.0)

## 2022-02-14 LAB — CBC WITH DIFFERENTIAL/PLATELET
Abs Immature Granulocytes: 0.03 10*3/uL (ref 0.00–0.07)
Basophils Absolute: 0 10*3/uL (ref 0.0–0.1)
Basophils Relative: 0 %
Eosinophils Absolute: 0.1 10*3/uL (ref 0.0–0.5)
Eosinophils Relative: 1 %
HCT: 26 % — ABNORMAL LOW (ref 39.0–52.0)
Hemoglobin: 8.6 g/dL — ABNORMAL LOW (ref 13.0–17.0)
Immature Granulocytes: 1 %
Lymphocytes Relative: 13 %
Lymphs Abs: 0.8 10*3/uL (ref 0.7–4.0)
MCH: 30.1 pg (ref 26.0–34.0)
MCHC: 33.1 g/dL (ref 30.0–36.0)
MCV: 90.9 fL (ref 80.0–100.0)
Monocytes Absolute: 0.5 10*3/uL (ref 0.1–1.0)
Monocytes Relative: 8 %
Neutro Abs: 4.7 10*3/uL (ref 1.7–7.7)
Neutrophils Relative %: 77 %
Platelets: 117 10*3/uL — ABNORMAL LOW (ref 150–400)
RBC: 2.86 MIL/uL — ABNORMAL LOW (ref 4.22–5.81)
RDW: 16 % — ABNORMAL HIGH (ref 11.5–15.5)
WBC: 6.2 10*3/uL (ref 4.0–10.5)
nRBC: 0 % (ref 0.0–0.2)

## 2022-02-14 LAB — BASIC METABOLIC PANEL
Anion gap: 12 (ref 5–15)
BUN: 21 mg/dL (ref 8–23)
CO2: 21 mmol/L — ABNORMAL LOW (ref 22–32)
Calcium: 8.8 mg/dL — ABNORMAL LOW (ref 8.9–10.3)
Chloride: 114 mmol/L — ABNORMAL HIGH (ref 98–111)
Creatinine, Ser: 1.82 mg/dL — ABNORMAL HIGH (ref 0.61–1.24)
GFR, Estimated: 38 mL/min — ABNORMAL LOW (ref >60)
Glucose, Bld: 76 mg/dL (ref 70–99)
Potassium: 3.5 mmol/L (ref 3.5–5.1)
Sodium: 147 mmol/L — ABNORMAL HIGH (ref 135–145)

## 2022-02-14 LAB — GLUCOSE, CAPILLARY
Glucose-Capillary: 44 mg/dL — CL (ref 70–99)
Glucose-Capillary: 113 mg/dL — ABNORMAL HIGH (ref 70–99)
Glucose-Capillary: 78 mg/dL (ref 70–99)
Glucose-Capillary: 114 mg/dL — ABNORMAL HIGH (ref 70–99)
Glucose-Capillary: 90 mg/dL (ref 70–99)

## 2022-02-14 LAB — TSH: TSH: 1.112 u[IU]/mL (ref 0.350–4.500)

## 2022-02-14 LAB — COMPREHENSIVE METABOLIC PANEL
ALT: 18 U/L (ref 0–44)
AST: 41 U/L (ref 15–41)
Albumin: 3.3 g/dL — ABNORMAL LOW (ref 3.5–5.0)
Alkaline Phosphatase: 56 U/L (ref 38–126)
Anion gap: 12 (ref 5–15)
BUN: 31 mg/dL — ABNORMAL HIGH (ref 8–23)
CO2: 17 mmol/L — ABNORMAL LOW (ref 22–32)
Calcium: 8.9 mg/dL (ref 8.9–10.3)
Chloride: 116 mmol/L — ABNORMAL HIGH (ref 98–111)
Creatinine, Ser: 2.3 mg/dL — ABNORMAL HIGH (ref 0.61–1.24)
GFR, Estimated: 29 mL/min — ABNORMAL LOW (ref >60)
Glucose, Bld: 54 mg/dL — ABNORMAL LOW (ref 70–99)
Potassium: 3.6 mmol/L (ref 3.5–5.1)
Sodium: 145 mmol/L (ref 135–145)
Total Bilirubin: 1.2 mg/dL (ref 0.3–1.2)
Total Protein: 6.3 g/dL — ABNORMAL LOW (ref 6.5–8.1)

## 2022-02-14 LAB — HEMOGLOBIN A1C
Hgb A1c MFr Bld: 4.4 % — ABNORMAL LOW (ref 4.8–5.6)
Mean Plasma Glucose: 79.58 mg/dL

## 2022-02-14 LAB — LIPASE, BLOOD: Lipase: 27 U/L (ref 11–51)

## 2022-02-14 MED ORDER — ATORVASTATIN CALCIUM 10 MG PO TABS
10.0000 mg | ORAL_TABLET | Freq: Every day | ORAL | Status: DC
  Administered 2022-02-14 – 2022-02-17 (×4): 10 mg via ORAL
  Filled 2022-02-14 (×4): qty 1

## 2022-02-14 MED ORDER — DEXTROSE-NACL 5-0.45 % IV SOLN: INTRAVENOUS | Status: AC

## 2022-02-14 MED ORDER — LORAZEPAM 2 MG/ML IJ SOLN
0.5000 mg | Freq: Four times a day (QID) | INTRAMUSCULAR | Status: DC | PRN
  Administered 2022-02-14: 0.5 mg via INTRAVENOUS
  Filled 2022-02-14: qty 1

## 2022-02-14 MED ORDER — HYDRALAZINE HCL 20 MG/ML IJ SOLN: 10.0000 mg | Freq: Four times a day (QID) | INTRAMUSCULAR | Status: DC | PRN

## 2022-02-14 MED ORDER — LACTATED RINGERS IV SOLN
INTRAVENOUS | Status: DC
  Administered 2022-02-14: 1000 mL via INTRAVENOUS

## 2022-02-14 MED ORDER — DOXAZOSIN MESYLATE 8 MG PO TABS
8.0000 mg | ORAL_TABLET | Freq: Every day | ORAL | Status: DC
  Administered 2022-02-14 – 2022-02-17 (×4): 8 mg via ORAL
  Filled 2022-02-14 (×5): qty 1

## 2022-02-14 MED ORDER — MELATONIN 3 MG PO TABS: 3.0000 mg | ORAL_TABLET | Freq: Every evening | ORAL | Status: DC | PRN

## 2022-02-14 MED ORDER — SENNOSIDES-DOCUSATE SODIUM 8.6-50 MG PO TABS
1.0000 | ORAL_TABLET | Freq: Two times a day (BID) | ORAL | Status: DC
  Administered 2022-02-14 – 2022-02-18 (×6): 1 via ORAL
  Filled 2022-02-14 (×7): qty 1

## 2022-02-14 MED ORDER — AMLODIPINE BESYLATE 10 MG PO TABS: 10.0000 mg | ORAL_TABLET | Freq: Every morning | ORAL | Status: DC

## 2022-02-14 MED ORDER — FERROUS SULFATE 325 (65 FE) MG PO TABS
325.0000 mg | ORAL_TABLET | Freq: Every day | ORAL | Status: DC
  Administered 2022-02-15 – 2022-02-18 (×4): 325 mg via ORAL
  Filled 2022-02-14 (×5): qty 1

## 2022-02-14 MED ORDER — DEXTROSE 50 % IV SOLN
1.0000 | INTRAVENOUS | Status: DC | PRN
  Administered 2022-02-14: 50 mL via INTRAVENOUS
  Filled 2022-02-14: qty 50

## 2022-02-14 MED ORDER — FINASTERIDE 5 MG PO TABS
5.0000 mg | ORAL_TABLET | Freq: Every day | ORAL | Status: DC
  Administered 2022-02-15 – 2022-02-18 (×4): 5 mg via ORAL
  Filled 2022-02-14 (×5): qty 1

## 2022-02-14 MED ORDER — SODIUM CHLORIDE 0.9 % IV SOLN
1.0000 g | INTRAVENOUS | Status: DC
  Administered 2022-02-14 – 2022-02-17 (×4): 1 g via INTRAVENOUS
  Filled 2022-02-14 (×4): qty 10

## 2022-02-14 MED ORDER — LORAZEPAM 2 MG/ML IJ SOLN
0.5000 mg | Freq: Once | INTRAMUSCULAR | Status: AC
  Administered 2022-02-14: 0.5 mg via INTRAVENOUS
  Filled 2022-02-14: qty 1

## 2022-02-14 MED ORDER — QUETIAPINE FUMARATE 25 MG PO TABS
25.0000 mg | ORAL_TABLET | Freq: Every day | ORAL | Status: DC
  Administered 2022-02-14 – 2022-02-17 (×4): 25 mg via ORAL
  Filled 2022-02-14 (×5): qty 1

## 2022-02-14 MED ORDER — PIPERACILLIN-TAZOBACTAM 3.375 G IVPB 30 MIN
3.3750 g | Freq: Once | INTRAVENOUS | Status: AC
  Administered 2022-02-14: 3.375 g via INTRAVENOUS
  Filled 2022-02-14: qty 50

## 2022-02-14 MED ORDER — ACETAMINOPHEN 650 MG RE SUPP: 650.0000 mg | Freq: Four times a day (QID) | RECTAL | Status: DC | PRN

## 2022-02-14 MED ORDER — AMLODIPINE BESYLATE 10 MG PO TABS
10.0000 mg | ORAL_TABLET | Freq: Every morning | ORAL | Status: DC
  Administered 2022-02-15 – 2022-02-18 (×4): 10 mg via ORAL
  Filled 2022-02-14 (×5): qty 1

## 2022-02-14 MED ORDER — LACTATED RINGERS IV BOLUS
1000.0000 mL | Freq: Once | INTRAVENOUS | Status: AC
  Administered 2022-02-14: 1000 mL via INTRAVENOUS

## 2022-02-14 MED ORDER — ACETAMINOPHEN 325 MG PO TABS
650.0000 mg | ORAL_TABLET | Freq: Four times a day (QID) | ORAL | Status: DC | PRN
  Administered 2022-02-14 – 2022-02-17 (×6): 650 mg via ORAL
  Filled 2022-02-14 (×6): qty 2

## 2022-02-14 MED ORDER — RIVASTIGMINE TARTRATE 1.5 MG PO CAPS
3.0000 mg | ORAL_CAPSULE | Freq: Two times a day (BID) | ORAL | Status: DC
  Administered 2022-02-15 – 2022-02-18 (×7): 3 mg via ORAL
  Filled 2022-02-14 (×10): qty 2

## 2022-02-14 MED ORDER — CHLORHEXIDINE GLUCONATE CLOTH 2 % EX PADS
6.0000 | MEDICATED_PAD | Freq: Every day | CUTANEOUS | Status: DC
  Administered 2022-02-15 – 2022-02-18 (×4): 6 via TOPICAL

## 2022-02-14 MED ORDER — QUETIAPINE FUMARATE 25 MG PO TABS
12.5000 mg | ORAL_TABLET | Freq: Every morning | ORAL | Status: DC
  Administered 2022-02-15 – 2022-02-18 (×4): 12.5 mg via ORAL
  Filled 2022-02-14 (×5): qty 1

## 2022-02-14 NOTE — ED Notes (Signed)
ED TO INPATIENT HANDOFF REPORT  ED Nurse Name and Phone #: Bonna Gains [phone]  S Name/Age/Gender Gerald [name] 77 y.o. male Room/Bed: 033C/033C  Code Status   Code Status: DNR  Home/SNF/Other Home Patient oriented to: self Is this baseline? Yes   Triage Complete: Triage complete  Chief Complaint Acute encephalopathy [G93.40]  Triage Note Pt arrives to ED from home with c/o increased agitation/confusion/hallucination. Pt w hx of dementia, family reports limited means of taking care of pt. Pt with foley from [facility]. Pt reported to be agitated with EMS   Allergies No Known Allergies  Level of Care/Admitting Diagnosis ED Disposition     ED Disposition  Admit   Condition  --   [place]: [place] [[id number]]  Level of Care: Telemetry Medical [104]  May admit patient to [facility] or [facility] if equivalent level of care is available:: No  Covid Evaluation: Asymptomatic - no recent exposure (last 10 days) testing not required  Diagnosis: Acute encephalopathy [id number]  Admitting Physician: Rhetta Mura [id number]  Attending Physician: Rhetta Mura [id number]  Certification:: I certify this patient will need inpatient services for at least 2 midnights  Estimated Length of Stay: 2          B Medical/Surgery History Past Medical History:  Diagnosis Date   Abnormality of gait 10/05/2014   Anemia    Anxiety    Bilateral edema of lower extremity    LE Dopplers 4/22 - no thrombus or thrombophelbitis, no reflux   Blood transfusion without reported diagnosis    with surgery 2012   Cataract    removed both eyes   CHF (congestive heart failure) ([name])    Chronic kidney disease    Colon polyp    DDD (degenerative disc disease)    Depression    DJD (degenerative joint disease)    DOE (dyspnea on exertion)    Echo 04 /22/2014 - normal EF, mild LVH, grade 1 Diastolic Dysfunction; Lexiscan Myoview 04/28/12 - no ishcemia  or infarction, LBBB septal wall motion; EF ~60%   Fatigue    Fatigue    pt states he gets tired very quickly   GERD (gastroesophageal reflux disease)    H/O iron deficiency anemia    with  hemmorrhoids   Hyperlipidemia    Hypertension    Left bundle branch block     chronic, negative Myoview a normal echo as noted above    Osteoarthritis    with bilateral hip surgeries and multiple complication, with the most recent  surgery in January 2012   Scrotal edema    Type 2 diabetes mellitus without complications ([place]) [id number]   Vitamin D deficiency    Past Surgical History:  Procedure Laterality Date   Cardiology Nuclear Med Study   04/29/2012   overall impression ; NORMAL  STRESS  NUCLEAR STUDY   COLONOSCOPY  01/28/2010   HIP SURGERY Right 01/06/2002   replacement   HIP SURGERY Left 02/2010   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, had necrosis, 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 left leg   ROTATOR CUFF REPAIR Left    UPPER GASTROINTESTINAL ENDOSCOPY     dilation   Venous Duplex  04/27/2012   LOWER EXTREMITY SWELLING -Impressions [name] ; This is a normal bilateral  lower extremity venous duplex Doppler evaluation     A IV Location/Drains/Wounds Patient Lines/Drains/Airways Status     Active Line/Drains/Airways     Name Placement date Placement time  Site Days   Peripheral IV 02/13/22 20 G Anterior;Proximal;Right Forearm 02/13/22  2330  Forearm  1   Urethral Catheter 02/12/22  0029  --  2            Intake/Output Last 24 hours  Intake/Output Summary (Last 24 hours) at 02/14/2022 0325 Last data filed at 02/14/2022 0257 Gross per 24 hour  Intake 601.95 ml  Output --  Net 601.95 ml    Labs/Imaging Results for orders placed or performed during the hospital encounter of 02/13/22 (from the past 48 hour(s))  CBC with Differential     Status: Abnormal   Collection Time: 02/13/22 11:28 PM  Result Value Ref Range   WBC 6.2 4.0 - 10.5 K/uL   RBC 2.86 (L) 4.22 - 5.81 MIL/uL    Hemoglobin 8.6 (L) 13.0 - 17.0 g/dL   HCT 26.0 (L) 39.0 - 52.0 %   MCV 90.9 80.0 - 100.0 fL   MCH 30.1 26.0 - 34.0 pg   MCHC 33.1 30.0 - 36.0 g/dL   RDW 16.0 (H) 11.5 - 15.5 %   Platelets 117 (L) 150 - 400 K/uL   nRBC 0.0 0.0 - 0.2 %   Neutrophils Relative % 77 %   Neutro Abs 4.7 1.7 - 7.7 K/uL   Lymphocytes Relative 13 %   Lymphs Abs 0.8 0.7 - 4.0 K/uL   Monocytes Relative 8 %   Monocytes Absolute 0.5 0.1 - 1.0 K/uL   Eosinophils Relative 1 %   Eosinophils Absolute 0.1 0.0 - 0.5 K/uL   Basophils Relative 0 %   Basophils Absolute 0.0 0.0 - 0.1 K/uL   Immature Granulocytes 1 %   Abs Immature Granulocytes 0.03 0.00 - 0.07 K/uL    Comment: Performed at [facility], [address], [place], [place] [address]  Comprehensive metabolic panel     Status: Abnormal   Collection Time: 02/13/22 11:28 PM  Result Value Ref Range   Sodium 145 135 - 145 mmol/L   Potassium 3.6 3.5 - 5.1 mmol/L   Chloride 116 (H) 98 - 111 mmol/L   CO2 17 (L) 22 - 32 mmol/L   Glucose, Bld 54 (L) 70 - 99 mg/dL    Comment: Glucose reference range applies only to samples taken after fasting for at least 8 hours.   BUN 31 (H) 8 - 23 mg/dL   Creatinine, Ser 2.30 (H) 0.61 - 1.24 mg/dL   Calcium 8.9 8.9 - 10.3 mg/dL   Total Protein 6.3 (L) 6.5 - 8.1 g/dL   Albumin 3.3 (L) 3.5 - 5.0 g/dL   AST 41 15 - 41 U/L   ALT 18 0 - 44 U/L   Alkaline Phosphatase 56 38 - 126 U/L   Total Bilirubin 1.2 0.3 - 1.2 mg/dL   GFR, Estimated 29 (L) >60 mL/min    Comment: (NOTE) Calculated using the CKD-EPI Creatinine Equation (2021)    Anion gap 12 5 - 15    Comment: Performed at [facility], [place] [address], [place], [place] [address]  Lipase, blood     Status: None   Collection Time: 02/13/22 11:28 PM  Result Value Ref Range   Lipase 27 11 - 51 U/L    Comment: Performed at [facility] [address], [place], [place] [address]  Urinalysis, Routine w reflex microscopic -Urine, Clean Catch     Status: Abnormal    Collection Time: 02/13/22 11:42 PM  Result Value Ref Range   Color,  Urine YELLOW YELLOW   APPearance CLOUDY (A) CLEAR   Specific Gravity, Urine 1.013 1.005 - 1.030   pH 5.0 5.0 - 8.0   Glucose, UA NEGATIVE NEGATIVE mg/dL   Hgb urine dipstick LARGE (A) NEGATIVE   Bilirubin Urine NEGATIVE NEGATIVE   Ketones, ur 20 (A) NEGATIVE mg/dL   Protein, ur 100 (A) NEGATIVE mg/dL   Nitrite NEGATIVE NEGATIVE   Leukocytes,Ua LARGE (A) NEGATIVE   RBC / HPF >50 0 - 5 RBC/hpf   WBC, UA >50 0 - 5 WBC/hpf   Bacteria, UA FEW (A) NONE SEEN   Squamous Epithelial / HPF 0-5 0 - 5 /HPF   Mucus PRESENT    Hyaline Casts, UA PRESENT     Comment: Performed at [facility] [address] [place], [place] [address]   CT ABDOMEN PELVIS WO CONTRAST  Result Date: 02/14/2022 CLINICAL DATA:  Microscopic hematuria EXAM: CT ABDOMEN AND PELVIS WITHOUT CONTRAST TECHNIQUE: Multidetector CT imaging of the abdomen and pelvis was performed following the standard protocol without IV contrast. RADIATION DOSE REDUCTION: This exam was performed according to the departmental dose-optimization program which includes automated exposure control, adjustment of the mA and/or kV according to patient size and/or use of iterative reconstruction technique. COMPARISON:  03/06/2010.  Ultrasound 02/11/2022 FINDINGS: Lower chest: No acute abnormality. Coronary artery and aortic calcifications. Hepatobiliary: No focal hepatic abnormality. Gallbladder unremarkable. Pancreas: No focal abnormality or ductal dilatation. Spleen: No focal abnormality.  Normal size. Adrenals/Urinary Tract: 2.2 cm left parapelvic cyst. No follow-up imaging recommended. No renal or ureteral stones. No hydronephrosis. Adrenal glands unremarkable. Foley catheter in the bladder which is mildly distended. Stomach/Bowel: Stomach, large and small bowel grossly unremarkable. Vascular/Lymphatic: Diffuse aortoiliac atherosclerosis. No evidence of aneurysm or adenopathy.  Reproductive: No visible focal abnormality. Other: No free fluid or free air. 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 Advanced degenerative changes in the lumbar spine. No acute bony abnormality. IMPRESSION: No renal or ureteral stones.  No hydronephrosis. Aortoiliac atherosclerosis, coronary artery disease. 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 No acute findings. Electronically Signed   By: Rolm Baptise M.D.   On: 02/14/2022 01:05    Pending Labs Unresulted Labs (From admission, onward)     Start     Ordered   02/14/22 0500  CBC with Differential/Platelet  Tomorrow morning,   R        02/14/22 0223   02/14/22 0500  Comprehensive metabolic panel  Tomorrow morning,   R        02/14/22 0223   02/14/22 0500  Ammonia  Tomorrow morning,   R        02/14/22 0245   02/14/22 0500  Protime-INR  Tomorrow morning,   R        02/14/22 0245   02/14/22 0500  Blood gas, venous  Tomorrow morning,   R        02/14/22 0245   02/14/22 0248  Hemoglobin A1c  Add-on,   AD       Comments: To assess prior glycemic control    02/14/22 0248   02/14/22 0247  Sodium, urine, random  Add-on,   AD        02/14/22 0246   02/14/22 0247  Creatinine, urine, random  Add-on,   AD        02/14/22 0246   02/14/22 0246  TSH  Add-on,   AD  02/14/22 0245   02/14/22 0246  Rapid urine drug screen (hospital performed)  Add-on,   AD        02/14/22 0245   02/14/22 0224  Magnesium  Add-on,   AD        02/14/22 0223   02/13/22 2305  Urine Culture (for pregnant, neutropenic or urologic patients or patients with an indwelling urinary catheter)  (Urine Labs)  Once,   URGENT       Question:  Indication  Answer:  Altered mental status (if no other cause identified)   02/13/22 2305            Vitals/Pain Today's Vitals   02/13/22 2345 02/14/22 0109 02/14/22 0200 02/14/22 0300  BP:  (!) 152/63 (!) 147/73 (!) 156/64  Pulse: 85 81 80 76   Resp: 14 20 18 12  $ Temp:  98.5 F (36.9 C)    TempSrc:  Axillary    SpO2: 100% 100% 97% 100%    Isolation Precautions No active isolations  Medications Medications  acetaminophen (TYLENOL) tablet 650 mg (has no administration in time range)    Or  acetaminophen (TYLENOL) suppository 650 mg (has no administration in time range)  melatonin tablet 3 mg (has no administration in time range)  cefTRIAXone (ROCEPHIN) 1 g in sodium chloride 0.9 % 100 mL IVPB (has no administration in time range)  lactated ringers infusion (1,000 mLs Intravenous New Bag/Given 02/14/22 0257)  dextrose 50 % solution 50 mL (has no administration in time range)  doxazosin (CARDURA) tablet 8 mg (has no administration in time range)  atorvastatin (LIPITOR) tablet 10 mg (has no administration in time range)  ferrous sulfate tablet 325 mg (has no administration in time range)  finasteride (PROSCAR) tablet 5 mg (has no administration in time range)  QUEtiapine (SEROQUEL) tablet 25 mg (has no administration in time range)  rivastigmine (EXELON) capsule 3 mg (has no administration in time range)  senna-docusate (Senokot-S) tablet 1 tablet (has no administration in time range)  lactated ringers bolus 1,000 mL (0 mLs Intravenous Stopped 02/14/22 0257)  piperacillin-tazobactam (ZOSYN) IVPB 3.375 g (0 g Intravenous Stopped 02/14/22 0149)    Mobility walks with device     Focused Assessments Neuro Assessment Handoff:  Swallow screen pass? Yes          Neuro Assessment: Exceptions to WDL Neuro Checks:      Has TPA been given? No If patient is a Neuro Trauma and patient is going to OR before floor call report to [facility] nurse: [phone] or [phone]   R Recommendations: See Admitting Provider Note  Report given to:   Additional Notes: Pt is a/o x1 with urinary catheter and leg bag in place. Pt is confused but compliant at baseline.

## 2022-02-14 NOTE — Progress Notes (Signed)
Patient admitted with Altered Mental status in the setting of Acute Cystitis. Now with worsening mental status from baseline and difficult to re-orient requiring one to one sitter.  Acute Metabolic Encephalopathy likely in the setting of: Acute Cystitis AKI Underlying Dementia with probable hospital Delirium -Obtain CTH given worsening mentation -Avoid sedatives as able -Actuary for safety   Rufina Falco, DNP, CCRN, FNP-C, AGACNP-BC Acute Care & Family Nurse Practitioner  [place] for personal pager PCCM on call pager [phone] until 7 am

## 2022-02-14 NOTE — ED Notes (Signed)
Patient transported to CT 

## 2022-02-14 NOTE — ED Notes (Signed)
Pt resting, rise/fall of chest noted. Vitals stable. Pt denies needs. Call bell in reach. No acute distress noted.

## 2022-02-14 NOTE — Progress Notes (Signed)
[facility]                                                                              Gerald [name], is a 77 y.o. male, DOB - [birth year], [id number] Admit date - 02/13/2022    Outpatient Primary MD for the patient is [name], DO  LOS - 0  days  Chief Complaint  Patient presents with   Agitation       Brief summary   Patient is a 77 year old male with dementia, CKD 3B with baseline creatinine 1.7-2.0, anemia, iron depression, chronic d CHF, HTN, HLP, DM, BPH presented to ED with altered mental status.  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 Over the course of last 1 week, patient has been having progressive confusion and weakness.  He was recently seen at [facility] long ED on 2/6 for urinary difficulty and Foley catheter was placed.  [facility], catheter was replaced secondary to clogging of initial catheter by blood clots.  On admission, was significantly altered, aggressive and not recognizing his family. Vital signs stable, Sodium 145, bicarb 17, anion gap 12, creatinine 2.3, glucose 54, LFTs normal.  UA positive for UTI. CT abdomen pelvis showed no evidence of acute intra-abdominal process, no evidence of ureteral stones or hydroureteronephrosis. Patient was not added for further workup.   Assessment & Plan    Principal Problem:   Acute metabolic encephalopathy superimposed on advanced dementia -Likely precipitated due to UTI, dehydration, acute kidney injury -Follow urine culture and sensitivities, continue IV Rocephin -Currently on Seroquel 25 mg nightly, will add Seroquel 12.5 mg qam due to increased agitation, sitter, delirium precautions -CT head showed no acute intracranial abnormality, ?  Posterior midline occiput scalp hematoma, no underlying skull fracture.  Active  Problems: Urinary tract infection, in the setting of recent Foley catheter placement -Follow urine culture and sensitivities, continue gentle hydration -Continue IV Rocephin   Acute kidney injury 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) -Baseline hemoglobin 1.6-1.9, was 1.98 on 11/22/2021 -Presented with creatinine of 2.30, was 2.0 on 2/6 -Continue IV fluid hydration, -CT abdomen pelvis showed no renal or ureteral stones, no hydronephrosis.  No acute findings    DM2 (diabetes mellitus, type 2) ([name]) with hypoglycemia, complications of CKD stage IIIb -Likely due to poor p.o. intake, CBG 44 this morning -For now placed on regular diet, D5 half normal saline infusion -Not on any oral hypoglycemics or insulin at home     Essential hypertension -BP elevated, resume Norvasc, Cardura -Added hydralazine IV as needed with parameters  Recent urinary retention 2/6 status post Foley catheter placement -Continue Foley catheter, continue finasteride, Cardura -Outpatient follow-up with urology    GERD (gastroesophageal reflux disease) -Continue PPI    Depression, advanced dementia, failure to thrive -Currently confused, likely due to UTI, generalized debility -Continue Seroquel 81m qhs, added 12.5 mg every morning due to agitation -PT OT evaluation    Chronic diastolic CHF (congestive heart failure) ([name]) -Euvolemic, no acute decompensation, follow  volume status -Currently on gentle hydration    Anemia of chronic disease -H&H currently stable,  baseline of 8-9  Prolonged QT -Acute PC 511, avoid QT prolonging medications -Follow Bmet, keep K ~4, Mg >2  Code Status: DNR DVT Prophylaxis:  SCDs Start: 02/14/22 0222   Level of Care: Level of care: Telemetry Medical Family Communication:  disposition Plan:      Remains inpatient appropriate: Workup in progress   Procedures:    Consultants:   None  Antimicrobials:   Anti-infectives (From admission, onward)     Start     Dose/Rate Route Frequency Ordered Stop   02/14/22 0800  cefTRIAXone (ROCEPHIN) 1 g in sodium chloride 0.9 % 100 mL IVPB        1 g 200 mL/hr over 30 Minutes Intravenous Every 24 hours 02/14/22 0245     02/14/22 0045  piperacillin-tazobactam (ZOSYN) IVPB 3.375 g        3.375 g 100 mL/hr over 30 Minutes Intravenous  Once 02/14/22 0038 02/14/22 0149          Medications  atorvastatin  10 mg Oral QHS   Chlorhexidine Gluconate Cloth  6 each Topical Daily   doxazosin  8 mg Oral QHS   ferrous sulfate  325 mg Oral Q breakfast   finasteride  5 mg Oral Daily   QUEtiapine  12.5 mg Oral q AM   QUEtiapine  25 mg Oral QHS   rivastigmine  3 mg Oral BID WC   senna-docusate  1 tablet Oral BID      Subjective:   Gerald [name] was seen and examined today.  Somewhat agitated and paranoid.  Difficult to obtain review of system from the patient.  BP elevated, no acute chest pain or shortness of breath.  No nausea or vomiting.  Foley catheter+  Objective:   Vitals:   02/14/22 0347 02/14/22 0512 02/14/22 0740 02/14/22 1117  BP:  (!) 160/88 (!) 160/78 (!) 166/73  Pulse:  92 85 82  Resp:  18 16 18  $ Temp: 98.5 F (36.9 C)     TempSrc: Oral     SpO2:  100% 100% 100%    Intake/Output Summary (Last 24 hours) at 02/14/2022 1242 Last data filed at 02/14/2022 1117 Gross per 24 hour  Intake 1307.4 ml  Output 1100 ml  Net 207.4 ml     Wt Readings from Last 3 Encounters:  12/07/21 64.4 kg  12/05/21 65.8 kg  11/22/21 64.4 kg     Exam General: Alert and oriented x self, NAD somewhat confused and agitated Cardiovascular: S1 S2 auscultated,  RRR Respiratory: CTAB Gastrointestinal: Soft, nontender, nondistended, + bowel sounds Ext: no pedal edema bilaterally Neuro: Strength 5/5 upper and lower extremities bilaterally Skin: No rashes Psych: confused    Data Reviewed:  I have personally reviewed following labs    CBC Lab Results  Component Value Date   WBC 6.2 02/13/2022    RBC 2.86 (L) 02/13/2022   HGB 8.6 (L) 02/13/2022   HCT 26.0 (L) 02/13/2022   MCV 90.9 02/13/2022   MCH 30.1 02/13/2022   PLT 117 (L) 02/13/2022   MCHC 33.1 02/13/2022   RDW 16.0 (H) 02/13/2022   LYMPHSABS 0.8 02/13/2022   MONOABS 0.5 02/13/2022   EOSABS 0.1 02/13/2022   BASOSABS 0.0 [id number]     Last metabolic panel Lab Results  Component Value Date   NA 145 02/13/2022   K 3.6 02/13/2022   CL 116 (H) 02/13/2022   CO2 17 (L)  02/13/2022   BUN 31 (H) 02/13/2022   CREATININE 2.30 (H) 02/13/2022   GLUCOSE 54 (L) 02/13/2022   GFRNONAA 29 (L) 02/13/2022   GFRAA >60 09/29/2014   CALCIUM 8.9 02/13/2022   PROT 6.3 (L) 02/13/2022   ALBUMIN 3.3 (L) 02/13/2022   BILITOT 1.2 02/13/2022   ALKPHOS 56 02/13/2022   AST 41 02/13/2022   ALT 18 02/13/2022   ANIONGAP 12 02/13/2022    CBG (last 3)  Recent Labs    02/14/22 0729 02/14/22 0810 02/14/22 1104  GLUCAP 44* 113* 78      Coagulation Profile: No results for input(s): "INR", "PROTIME" in the last 168 hours.   Radiology Studies: I have personally reviewed the imaging studies  CT HEAD WO CONTRAST (5MM)  Result Date: 02/14/2022 CLINICAL DATA:  77 year old male with delirium. EXAM: CT HEAD WITHOUT CONTRAST TECHNIQUE: Contiguous axial images were obtained from the base of the skull through the vertex without intravenous contrast. RADIATION DOSE REDUCTION: This exam was performed according to the departmental dose-optimization program which includes automated exposure control, adjustment of the mA and/or kV according to patient size and/or use of iterative reconstruction technique. COMPARISON:  Head CT 09/13/2021, brain MRI 10/06/2020. FINDINGS: Brain: Motion artifact. Stable cerebral volume. No midline shift, mass effect, or evidence of intracranial mass lesion. No ventriculomegaly. When allowing for motion gray-white differentiation appears stable since last year. No convincing intracranial hemorrhage or cortically based infarct.  Vascular: Calcified atherosclerosis at the skull base. No suspicious intracranial vascular hyperdensity. Skull: Motion artifact.  No acute osseous abnormality identified. Sinuses/Orbits: Visualized paranasal sinuses and mastoids are stable and well aerated. There is chronic maxillary sinus mucoperiosteal thickening, stable. Other: Motion artifact. Suspicion of midline occiput put scalp hematoma on series 4, image 34 up to 11 mm in thickness. Grossly stable and negative orbits. IMPRESSION: 1. Motion degraded exam. No acute intracranial abnormality identified. 2. Query posterior midline occiput scalp hematoma. No underlying skull fracture is evident. Electronically Signed   By: Genevie Ann M.D.   On: 02/14/2022 07:06   CT ABDOMEN PELVIS WO CONTRAST  Result Date: 02/14/2022 CLINICAL DATA:  Microscopic hematuria EXAM: CT ABDOMEN AND PELVIS WITHOUT CONTRAST TECHNIQUE: Multidetector CT imaging of the abdomen and pelvis was performed following the standard protocol without IV contrast. RADIATION DOSE REDUCTION: This exam was performed according to the departmental dose-optimization program which includes automated exposure control, adjustment of the mA and/or kV according to patient size and/or use of iterative reconstruction technique. COMPARISON:  03/06/2010.  Ultrasound 02/11/2022 FINDINGS: Lower chest: No acute abnormality. Coronary artery and aortic calcifications. Hepatobiliary: No focal hepatic abnormality. Gallbladder unremarkable. Pancreas: No focal abnormality or ductal dilatation. Spleen: No focal abnormality.  Normal size. Adrenals/Urinary Tract: 2.2 cm left parapelvic cyst. No follow-up imaging recommended. No renal or ureteral stones. No hydronephrosis. Adrenal glands unremarkable. Foley catheter in the bladder which is mildly distended. Stomach/Bowel: Stomach, large and small bowel grossly unremarkable. Vascular/Lymphatic: Diffuse aortoiliac atherosclerosis. No evidence of aneurysm or adenopathy.  Reproductive: No visible focal abnormality. Other: No free fluid or free air. 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 Advanced degenerative changes in the lumbar spine. No acute bony abnormality. IMPRESSION: No renal or ureteral stones.  No hydronephrosis. Aortoiliac atherosclerosis, coronary artery disease. 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 No acute findings. Electronically Signed   By: Rolm Baptise M.D.   On: 02/14/2022 01:05       Gerald [name] M.D. [facility] 02/14/2022,  12:42 PM  Available via Epic secure chat 7am-7pm After 7 pm, please refer to night coverage provider listed on amion.

## 2022-02-14 NOTE — TOC Initial Note (Signed)
Transition of Care [facility]) - Initial/Assessment Note    Patient Details  Name: Gerald [name] MRN: [id number] Date of Birth: [birth year]  Transition of Care [facility]) CM/SW Contact:    Joanne Chars, LCSW Phone Number: 02/14/2022, 2:42 PM  Clinical Narrative:    Pt oriented x1.  CSW spoke with daughter Joseph Art for initial assessment and also related to consult for assistance with locating memory care.  Renee reports pt lives at home with wife, who still works full time. Pt home alone during the day but sleeps during that time.  No other services. Pt was dx with demential about a year ago. Pt agitation and confusion has been growing worse.  The family completed a long term care medicaid application at [facility] yesterday and were given a list of SNF facilities with dementia units.  Renee indicated that DSS seemed to say the medicaid was approved and that is the payer.  CSW discussed additional list of memory care ALFs that CSW will leave in pt room for them to pick up.    Renee agreeable to call facilities regarding memory care options.  TOC will continue to monitor for other DC recommendations related to this hospitalization.               Expected Discharge Plan:  (TBD) Barriers to Discharge: Continued Medical Work up   Patient Goals and CMS Choice            Expected Discharge Plan and Services In-house Referral: Clinical Social Work   Post Acute Care Choice:  (TBD) Living arrangements for the past 2 months: Single Family Home                                      Prior Living Arrangements/Services Living arrangements for the past 2 months: Single Family Home Lives with:: Spouse Patient language and need for interpreter reviewed:: No        Need for Family Participation in Patient Care: Yes (Comment)   Current home services: Other (comment) (none)    Activities of Daily Living      Permission Sought/Granted                  Emotional Assessment Appearance::  Appears stated age Attitude/Demeanor/Rapport: Unable to Assess Affect (typically observed): Unable to Assess Orientation: : Oriented to Self      Admission diagnosis:  Acute encephalopathy [G93.40] AKI (acute kidney injury) ([place]) [N17.9] Altered mental status, unspecified altered mental status type [R41.82] Patient Active Problem List   Diagnosis Date Noted   Acute cystitis 02/14/2022   Hypoglycemia 02/14/2022   Acute urinary retention 09/16/2021   Alzheimer's dementia with behavioral disturbance ([place]) 09/14/2021   Prolonged QT interval Q000111Q   Acute metabolic encephalopathy Q000111Q   AKI (acute kidney injury) ([place])    Acute renal failure superimposed on stage 3b chronic kidney disease ([place])    Acute encephalopathy 09/13/2021   Anemia of chronic renal failure 06/26/2021   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) 10/16/2020   Anxiety  state 01/04/2020   Benign prostatic hyperplasia without lower urinary tract symptoms 01/04/2020   Chronic diastolic CHF (congestive heart failure) ([place]) 01/04/2020   Diabetic renal disease ([place]) 01/04/2020   Diabetic retinopathy ([place]) 01/04/2020   ED (erectile dysfunction) of organic origin 01/04/2020   Hypertrophic cardiomyopathy ([place]) 01/04/2020   Insomnia 01/04/2020   Left bundle branch block 01/04/2020   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   Primary localized osteoarthritis of pelvic region and thigh 01/04/2020   Stage 3 chronic kidney disease ([place]) 01/04/2020   Vitamin B12 deficiency (non anemic) 01/04/2020   Recurrent major  depression ([place]) 01/04/2020   Severe episode of recurrent major depressive disorder, without psychotic features ([place]) 10/05/2018   Cognitive complaints with normal neuropsychological exam 03/11/2018   Referred otalgia of left ear 09/23/2016   Temporomandibular joint (TMJ) pain 09/23/2016   Cervicogenic headache 09/03/2016   Obstructive sleep apnea syndrome 11/15/2015   Memory difficulties 10/05/2014   Abnormality of gait 10/05/2014   Intermittent explosive disorder 09/25/2014   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   Encounter for long-term (current) use of other medications 04/28/2013   Vitamin D Deficiency 04/28/2013   Sleep disorder -Epworth OSA scale = 15 06/02/2012   Anal fissure 08/09/2010   Septic arthritis of hip ([place]) 04/11/2010   HYPERLIPIDEMIA 01/11/2010   DEPRESSION 01/11/2010   Essential hypertension 01/11/2010   GERD (gastroesophageal reflux disease) 01/11/2010   Depression 01/11/2010   Hyperlipidemia 01/11/2010   DM2 (diabetes mellitus, type 2) ([place]) 09/21/2008   ANEMIA, IRON DEFICIENCY 09/21/2008   Personal history of colonic polyps 09/21/2008   Anemia, iron deficiency 09/21/2008   PCP:  [name], DO Pharmacy:   [facility] [place], [address] [place] [address] Phone: [phone] Fax: [phone]     Social Determinants of Health ([place]) Social History: SDOH Screenings   Food Insecurity: Unknown (09/14/2021)  Housing: Low Risk  (09/14/2021)  Transportation Needs: No Transportation Needs (09/14/2021)  Utilities: Not At Risk (09/14/2021)  Tobacco Use: Medium Risk (02/14/2022)   SDOH Interventions:     Readmission Risk Interventions    09/16/2021   10:18 AM  Readmission Risk Prevention Plan  Transportation Screening Complete  PCP or Specialist Appt within 5-7 Days Complete  Home Care Screening Complete   Medication Review (RN CM) Complete

## 2022-02-14 NOTE — Plan of Care (Signed)
  Problem: Education: Goal: Ability to describe self-care measures that may prevent or decrease complications (Diabetes Survival Skills Education) will improve 02/14/2022 1933 by Morene Rankins, LPN Outcome: Progressing 02/14/2022 0741 by Morene Rankins, LPN Outcome: Not Progressing Goal: Individualized Educational Video(s) Outcome: Progressing   Problem: Coping: Goal: Ability to adjust to condition or change in health will improve 02/14/2022 1933 by Morene Rankins, LPN Outcome: Progressing 02/14/2022 0741 by Morene Rankins, LPN Outcome: Not Progressing   Problem: Fluid Volume: Goal: Ability to maintain a balanced intake and output will improve 02/14/2022 1933 by Morene Rankins, LPN Outcome: Progressing 02/14/2022 0741 by Morene Rankins, LPN Outcome: Not Progressing   Problem: Health Behavior/Discharge Planning: Goal: Ability to identify and utilize available resources and services will improve 02/14/2022 1933 by Morene Rankins, LPN Outcome: Progressing 02/14/2022 0741 by Morene Rankins, LPN Outcome: Not Progressing Goal: Ability to manage health-related needs will improve 02/14/2022 1933 by Morene Rankins, LPN Outcome: Progressing 02/14/2022 0741 by Morene Rankins, LPN Outcome: Not Progressing   Problem: Metabolic: Goal: Ability to maintain appropriate glucose levels will improve Outcome: Progressing   Problem: Nutritional: Goal: Maintenance of adequate nutrition will improve 02/14/2022 1933 by Morene Rankins, LPN Outcome: Progressing 02/14/2022 0741 by Morene Rankins, LPN Outcome: Not Progressing Goal: Progress toward achieving an optimal weight will improve Outcome: Progressing   Problem: Skin Integrity: Goal: Risk for impaired skin integrity will decrease Outcome: Progressing   Problem: Tissue Perfusion: Goal: Adequacy of tissue perfusion will improve Outcome: Progressing   Problem: Education: Goal: Knowledge of General Education information will  improve Description: Including pain rating scale, medication(s)/side effects and non-pharmacologic comfort measures Outcome: Progressing   Problem: Health Behavior/Discharge Planning: Goal: Ability to manage health-related needs will improve Outcome: Progressing   Problem: Clinical Measurements: Goal: Ability to maintain clinical measurements within normal limits will improve Outcome: Progressing Goal: Will remain free from infection Outcome: Progressing Goal: Diagnostic test results will improve Outcome: Progressing Goal: Respiratory complications will improve Outcome: Progressing Goal: Cardiovascular complication will be avoided Outcome: Progressing   Problem: Activity: Goal: Risk for activity intolerance will decrease Outcome: Progressing   Problem: Nutrition: Goal: Adequate nutrition will be maintained Outcome: Progressing   Problem: Coping: Goal: Level of anxiety will decrease Outcome: Progressing   Problem: Elimination: Goal: Will not experience complications related to bowel motility Outcome: Progressing Goal: Will not experience complications related to urinary retention Outcome: Progressing   Problem: Pain Managment: Goal: General experience of comfort will improve Outcome: Progressing   Problem: Safety: Goal: Ability to remain free from injury will improve Outcome: Progressing   Problem: Skin Integrity: Goal: Risk for impaired skin integrity will decrease Outcome: Progressing

## 2022-02-14 NOTE — Plan of Care (Signed)
  Problem: Education: Goal: Ability to describe self-care measures that may prevent or decrease complications (Diabetes Survival Skills Education) will improve Outcome: Not Progressing   Problem: Coping: Goal: Ability to adjust to condition or change in health will improve Outcome: Not Progressing   Problem: Fluid Volume: Goal: Ability to maintain a balanced intake and output will improve Outcome: Not Progressing   Problem: Health Behavior/Discharge Planning: Goal: Ability to identify and utilize available resources and services will improve Outcome: Not Progressing Goal: Ability to manage health-related needs will improve Outcome: Not Progressing   Problem: Nutritional: Goal: Maintenance of adequate nutrition will improve Outcome: Not Progressing

## 2022-02-14 NOTE — H&P (Signed)
History and Physical      Gerald [name] [id number] DOB: [birth year] DOA: 02/13/2022  PCP: [name], DO  Patient coming from: home   I have personally briefly reviewed patient's old medical records in [place]  Chief Complaint: Confusion  HPI: Gerald [name] is a 77 y.o. 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    In the setting of the patient's confusion, the following history is provided by patient's wife in addition to my discussions with the EDP as well as via chart review.  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  Additionally, baseline, the patient is reportedly able to be contributory towards his ADLs.  He lives at home with his wife, although family has been attempting to pursue transition to a memory care facility as of recent.  Presentation is baseline, with course of last week, patient has exhibited evidence of progressive confusion, during which time he is consequently unable to provide significant contribution towards his ADL completion.   He was seen recently at [facility] emergency department on 02/11/2022 at which time he was diagnosed with acute urinary retention.  No evidence of urinary tract infection at that time.  Foley catheter was placed that was in the emergency department, the patient was discharged to home, and underwent follow-up at [facility] urology clinic on 02/12/2022, at which time Foley catheter was found to be clogged, including with some blood clots.  This prompted replacement of the  patient's Foley catheter at [facility] urology clinic on 02/12/2022.  However, in the interval since that time, patient has exhibited progressive evidence of confusion relative to the above mental status baseline, prompting his presentation to the [facility] emergency department this evening for further evaluation and management thereof.  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    ED Course:  Vital signs in the ED were notable for the following: Afebrile; heart rate 80 to 90s;*blood pressures in the 140s 150s; respiratory rate 14-20, oxygen saturation 100% on room air.  Labs were notable for the following: CMP notable for the following: Sodium 145, chloride 116, bicarb at 17, anion gap 12, creatinine 2.30, glucose 54, liver enzymes within normal limits.  CBC notable for open cell count 6200 with 77% neutrophils, hemoglobin 8.6 compared to 9.2 on 02/11/2022.  Urinalysis associated with cloudy appearing specimen and notable for greater than 50 white blood cells, large leukocyte esterase, discussed the will cells as well as 100 protein.  Urine culture collected prior to initiation of IV  Per my interpretation, EKG in ED demonstrated the following: EKG shows sinus rhythm with left will Norins block, rate 88, nonspecific to inversions in leads II, aVF, V5, V6, and evidence of ST changes, including evidence of ST elevation.  Imaging and additional notable ED work-up: CT abdomen/pelvis without contrast showed no evidence of acute intra-abdominal or acute intra-abdominal process, including no evidence of ureteral stones or any evidence of hydroureteronephrosis.  While in the ED, the following were administered: Zosyn, lactated Ringer's 1 L bolus.  Subsequently, the patient  was admitted for further evaluation management of suspected acute metabolic encephalopathy along with acute kidney injury  superimposed on CKD 3B, acute cystitis, and hypoglycemia.     Review of Systems: As per HPI otherwise 10 point review of systems negative.   Past Medical History:  Diagnosis Date   Abnormality of gait 10/05/2014   Anemia    Anxiety    Bilateral edema of lower extremity    LE Dopplers 4/22 - no thrombus or thrombophelbitis, no reflux   Blood transfusion without reported diagnosis    with surgery 2012   Cataract    removed both eyes   CHF (congestive heart failure) ([name])    Chronic kidney disease    Colon polyp    DDD (degenerative disc disease)    Depression    DJD (degenerative joint disease)    DOE (dyspnea on exertion)    Echo 04 /22/2014 - normal EF, mild LVH, grade 1 Diastolic Dysfunction; Lexiscan Myoview 04/28/12 - no ishcemia or infarction, LBBB septal wall motion; EF ~60%   Fatigue    Fatigue    pt states he gets tired very quickly   GERD (gastroesophageal reflux disease)    H/O iron deficiency anemia    with  hemmorrhoids   Hyperlipidemia    Hypertension    Left bundle branch block     chronic, negative Myoview a normal echo as noted above    Osteoarthritis    with bilateral hip surgeries and multiple complication, with the most recent  surgery in January 2012   Scrotal edema    Type 2 diabetes mellitus without complications ([place]) [id number]   Vitamin D deficiency     Past Surgical History:  Procedure Laterality Date   Cardiology Nuclear Med Study   04/29/2012   overall impression ; NORMAL  STRESS  NUCLEAR STUDY   COLONOSCOPY  01/28/2010   HIP SURGERY Right 01/06/2002   replacement   HIP SURGERY Left 02/2010   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 His smoking use included cigarettes. He has a 15.00 pack-year smoking history. He has never used smokeless tobacco. He reports current alcohol use of about 1.0 standard drink of alcohol per week. He reports that he does not use drugs.   No Known Allergies  Family History  Problem Relation Age of Onset   Dementia Mother    Hypertension Mother    Heart disease Mother    Diabetes Mother    Heart attack Maternal Grandmother    Diabetes Maternal Grandfather    Colon cancer Neg Hx    Colon polyps Neg Hx    Esophageal cancer Neg Hx    Rectal cancer Neg Hx    Stomach cancer Neg Hx     Family history reviewed and not pertinent    Prior to Admission medications   Medication Sig Start Date End Date Taking? Authorizing Provider  amLODipine (NORVASC) 10 MG tablet Take 10 mg by mouth in the morning.    [provider]  atorvastatin (LIPITOR) 10 MG tablet Take 10 mg by mouth at bedtime.    [provider]  buPROPion (WELLBUTRIN XL) 150 MG 24 hr tablet TAKE 1 TABLET (150 MG TOTAL) BY MOUTH EVERY MORNING. Patient taking differently: Take 450 mg by mouth in the morning. 01/16/15   Arfeen, Arlyce Harman, MD  doxazosin (CARDURA) 8 MG tablet Take 1 tablet (8 mg total) by mouth at bedtime. 09/16/21   Aline August, MD  ferrous sulfate 325 (65 FE) MG tablet Take 325 mg by mouth daily with breakfast.    [provider]  finasteride (PROSCAR) 5 MG tablet Take 5 mg by mouth daily. 10/02/21   [provider]  FLUoxetine (PROZAC) 40 MG capsule Take 1 capsule (40 mg total) by mouth daily. Patient taking differently: Take 40 mg by mouth in the morning. 01/16/15   Arfeen, Arlyce Harman, MD  Melatonin 5 MG SUBL Place 5 mg under the tongue at bedtime as needed (sleep).     [provider]  memantine (NAMENDA) 10 MG tablet Take 1 tablet (10 mg total) by mouth 2 (two) times daily. 07/25/21 07/20/22  Alric Ran, MD  Multiple Vitamins-Minerals (CENTRUM  SILVER 50+MEN) TABS Take 1 tablet by mouth daily with breakfast.    [provider]  MYRBETRIQ 25 MG TB24 tablet Take 25 mg by mouth daily. 01/16/22   [provider]  omeprazole (PRILOSEC) 40 MG capsule Take 1 capsule (40 mg total) by mouth daily. Patient taking differently: Take 40 mg by mouth See admin instructions. Take 40 mg by mouth mid-morning every day 01/12/13   Unk Pinto, MD  polyethylene glycol (MIRALAX / GLYCOLAX) 17 g packet Take 17 g by mouth daily as needed for moderate constipation. 09/16/21   Aline August, MD  QUEtiapine (SEROQUEL) 25 MG tablet Take 1 tablet (25 mg total) by mouth at bedtime. 12/05/21 11/30/22  Alric Ran, MD  rivastigmine (EXELON) 3 MG capsule Take 1 capsule (3 mg total) by mouth 2 (two) times daily. 07/25/21 07/20/22  Alric Ran, MD  senna-docusate (SENOKOT-S) 8.6-50 MG tablet Take 1 tablet by mouth 2 (two) times daily. 09/16/21   Aline August, MD     Objective    Physical Exam: Vitals:   02/13/22 2300 02/13/22 2345 02/14/22 0109 02/14/22 0200  BP: (!) 159/64  (!) 152/63 (!) 147/73  Pulse:  85 81 80  Resp:  14 20 18  $ Temp:   98.5 F (36.9 C)   TempSrc:   Axillary   SpO2:  100% 100% 97%    General: appears to be stated age; alert, confused Skin: warm, dry, no rash Head:  AT/[place] Mouth:  Oral mucosa membranes appear dry, normal dentition Neck: supple; trachea midline Heart:  RRR; did not appreciate any M/R/G Lungs: CTAB, did not appreciate any wheezes, rales, or rhonchi Abdomen: + BS; soft, ND, NT Vascular: 2+ pedal pulses b/l; 2+ radial pulses b/l Extremities: no peripheral edema, no muscle wasting Neuro: strength and sensation intact in upper and lower extremities b/l   Labs on Admission: I have personally reviewed following labs and imaging studies  CBC: Recent Labs  Lab 02/11/22 2058 02/13/22 2328  WBC 5.1 6.2  NEUTROABS 3.5 4.7  HGB 9.2* 8.6*  HCT 27.4* 26.0*  MCV 88.4 90.9  PLT 135* 117*   Basic  Metabolic Panel: Recent Labs  Lab 02/11/22 2058 02/13/22 2328  NA 141 145  K 3.2* 3.6  CL 113* 116*  CO2 17* 17*  GLUCOSE 85 54*  BUN 23 31*  CREATININE 2.02* 2.30*  CALCIUM 9.0 8.9   GFR: CrCl cannot be calculated (Unknown ideal  weight.). Liver Function Tests: Recent Labs  Lab 02/13/22 2328  AST 41  ALT 18  ALKPHOS 56  BILITOT 1.2  PROT 6.3*  ALBUMIN 3.3*   Recent Labs  Lab 02/13/22 2328  LIPASE 27   No results for input(s): "AMMONIA" in the last 168 hours. Coagulation Profile: No results for input(s): "INR", "PROTIME" in the last 168 hours. Cardiac Enzymes: No results for input(s): "CKTOTAL", "CKMB", "CKMBINDEX", "TROPONINI" in the last 168 hours. BNP (last 3 results) No results for input(s): "PROBNP" in the last 8760 hours. HbA1C: No results for input(s): "HGBA1C" in the last 72 hours. CBG: No results for input(s): "GLUCAP" in the last 168 hours. Lipid Profile: No results for input(s): "CHOL", "HDL", "LDLCALC", "TRIG", "CHOLHDL", "LDLDIRECT" in the last 72 hours. Thyroid Function Tests: No results for input(s): "TSH", "T4TOTAL", "FREET4", "T3FREE", "THYROIDAB" in the last 72 hours. Anemia Panel: No results for input(s): "VITAMINB12", "FOLATE", "FERRITIN", "TIBC", "IRON", "RETICCTPCT" in the last 72 hours. Urine analysis:    Component Value Date/Time   COLORURINE YELLOW 02/13/2022 2342   APPEARANCEUR CLOUDY (A) 02/13/2022 2342   LABSPEC 1.013 02/13/2022 2342   PHURINE 5.0 02/13/2022 2342   GLUCOSEU NEGATIVE 02/13/2022 2342   HGBUR LARGE (A) 02/13/2022 2342   BILIRUBINUR NEGATIVE 02/13/2022 2342   KETONESUR 20 (A) 02/13/2022 2342   PROTEINUR 100 (A) 02/13/2022 2342   UROBILINOGEN 0.2 07/15/2013 1359   NITRITE NEGATIVE 02/13/2022 2342   LEUKOCYTESUR LARGE (A) 02/13/2022 2342    Radiological Exams on Admission: CT ABDOMEN PELVIS WO CONTRAST  Result Date: 02/14/2022 CLINICAL DATA:  Microscopic hematuria EXAM: CT ABDOMEN AND PELVIS WITHOUT CONTRAST  TECHNIQUE: Multidetector CT imaging of the abdomen and pelvis was performed following the standard protocol without IV contrast. RADIATION DOSE REDUCTION: This exam was performed according to the departmental dose-optimization program which includes automated exposure control, adjustment of the mA and/or kV according to patient size and/or use of iterative reconstruction technique. COMPARISON:  03/06/2010.  Ultrasound 02/11/2022 FINDINGS: Lower chest: No acute abnormality. Coronary artery and aortic calcifications. Hepatobiliary: No focal hepatic abnormality. Gallbladder unremarkable. Pancreas: No focal abnormality or ductal dilatation. Spleen: No focal abnormality.  Normal size. Adrenals/Urinary Tract: 2.2 cm left parapelvic cyst. No follow-up imaging recommended. No renal or ureteral stones. No hydronephrosis. Adrenal glands unremarkable. Foley catheter in the bladder which is mildly distended. Stomach/Bowel: Stomach, large and small bowel grossly unremarkable. Vascular/Lymphatic: Diffuse aortoiliac atherosclerosis. No evidence of aneurysm or adenopathy. Reproductive: No visible focal abnormality. Other: No free fluid or free air. 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 Advanced degenerative changes in the lumbar spine. No acute bony abnormality. IMPRESSION: No renal or ureteral stones.  No hydronephrosis. Aortoiliac atherosclerosis, coronary artery disease. 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 No acute findings. Electronically Signed   By: Rolm Baptise M.D.   On: 02/14/2022 01:05      Assessment/Plan   Principal Problem:   Acute metabolic encephalopathy Active Problems:   DM2 (diabetes mellitus, type 2) ([name])   Essential hypertension   GERD (gastroesophageal reflux disease)   Depression   Chronic diastolic CHF (congestive heart failure) ([name])   Anemia of chronic disease   Acute renal failure superimposed on stage  3b chronic kidney disease ([place])   Acute cystitis   Hypoglycemia      #) Acute metabolic encephalopathy: 1 week of confusion relative to his baseline dementia, is further quantified above, with potential multifactorial contributions, including on the basis of  physiologic stressors stemming from presenting urinary tract infection, as further detailed below, and complicated by acute kidney injury superimposed on CKD 3B noting that multiple of the patient's home medications are associated with renal metabolism/renal clearance.  Potential additional pharmacologic contributions from use of multiple centrally acting medications at home, including that of Wellbutrin, Prozac, as well as the anticholinergic implications of Myrbetriq.  Potential additional metabolic contribution from hypoglycemia in the context of a documented history of type 2 diabetes mellitus, with potential exacerbation of the patient's low blood sugar as consequence of his slight distress stemming from suspected presenting infectious process, as above.   No overt acute focal neurologic deficits to suggest a contribution from an underlying acute CVA. Seizures are also felt to be less likely. Will check VBG to evaluate for any contribution from hypercapnic encephalopathy, particularly given a 15-pack-year history of smoking.  At that   Plan: fall precautions. Repeat CMP/CBC in the AM. Check magnesium level. check TSH, vbg.  Check ammonia level, INR, urine drug screen.  Further evaluation management of suspected acute cystitis, as below, as well as further evaluation management of AKI superimposed on CKD 3B, extremities overload.  Gentle IV fluids to today contribution from dehydration.  Hold home Wellbutrin, Prozac, and Myrbetriq for now.  Additionally, TOC consult placed, noting patient's family's desire to explore potential transition to memory care facility, as further detailed above.            #) Acute Kidney Injury  superimposed on CKD 3B:  as quantified above.  In terms of AKI component, suspect postrenal contribution from recent diagnosis of urinary retention in the setting of BPH.  Will closely monitor for ensuing improvement in renal function given interval placement of Foley catheter to alleviate such a postrenal contribution.  Of note, given this recent diagnosis of urinary retention, there is potential pharmacologic exacerbation as consequence of Myrbetriq, further promoting urinary retention.  Additionally, suspect a prerenal contribution in the setting of dehydration, given clinical evidence of such as well as presence of hyaline casts and elevated specific gravity, consistent with the picture of dehydration, with likely exacerbation from diminished oral intake over the last several days as a consequence of the patient's altered mental status.  Urinalysis with microscopy performed, with results as detailed above.    Plan: monitor strict I's & O's and daily weights. Attempt to avoid nephrotoxic agents.  Hold home Myrbetriq for now.  Refrain from NSAIDs. Repeat CMP in the morning. Check serum magnesium level. Add-on random urine sodium and random urine creatinine.  Gentle IV fluids, as above.  Continue Foley catheter for now.            #) Acute cystitis: In the setting of recent development of confusion relative to baseline mental status, presenting urinalysis appears suggestive of underlying urinary tract infection, with UA showing evidence of significant pyuria, large leukocyte Estrace and a cloudy appearing specimen in the absence of any associated squamous epithelial cells to suggest an element of contamination.  Of note, no SIRS criteria met at this time.  Therefore, criteria for sepsis not currently met.  Urine culture collected prior to initiation of Zosyn in the ED.  For now, will de-escalate to Rocephin, closely monitor a suing clinical trend as well as considering results of aforementioned  urinary culture.  In terms of predisposing factors, suspect contribution from recent diagnosis of urinary retention promoting elevated risk for UTI on the basis of associated urinary stasis.   Plan: Follow-up result urine culture.  Rocephin.  Repeat CBC in the morning.  Gentle IV fluids, as above.             #) Hypoglycemia: Presenting CMP suggestive of hypoglycemia with glucose of 54, potentially representing a additional contribution towards the patient's presenting acute metabolic encephalopathy.  This is in the context of a known diagnosis of type 2 diabetes mellitus, which appears to be managed via lifestyle modifications in the absence of any insulin or oral hypoglycemic agents at home, including no use of sulfonylureas.  Potential contribution towards this hypoglycemia stemming from acute infection in the form of suspected acute cystitis, as above.  Most recent hemoglobin A1c was found to be 5.6% when checked 9 years ago.  Plan: Accu-Cheks before every meal and at bedtime in the absence of sliding scale insulin for now.  Prn amp of D50 for CBG less than 78.  Add on hemoglobin A1c level.            #) Anemia of chronic kidney disease: Documented history of such, a/w with baseline hgb range 8-10, with presenting hgb consistent with this range, in the absence of any overt evidence of active bleed.  Of note, the patient is on daily oral iron supplementation at home.   Plan: Repeat CBC in the morning.  Continue home daily oral iron supplementation.  Check INR.            #) Nonanion gap metabolic acidosis: As detected and quantified on presenting CMP, as above.  Suspect potential multifactorial contributions, including suspicion for renal tubular acidosis given non-anion gap metabolic acidosis with concomitant hyperchloremia, as well as potential additional contribution from starvation ketoacidosis given report of recent decline in oral intake as well as urinalysis  demonstrating 20 ketones.   Plan: IV fluids, as above.  Follow-up resolved VBG.  CMP in the morning.            #) Chronic diastolic heart failure: documented history of such, with most recent echocardiogram performed in April 2016, which was notable for LVEF 55 to 60%, no focal wall motion arise, grade 1 diastolic dysfunction, and mild mitral regurgitation as well as mild tricuspid regurgitation.. No clinical evidence to suggest acutely decompensated heart failure at this time.  Rather, there is clinical evidence of mild dehydration, as further expressed above.  Home diuretic regimen reportedly consists of the following: None.    Plan: monitor strict I's & O's and daily weights. Repeat CMP in AM. Check serum mag level.           #) Depression: documented h/o such. On Wellbutrin as well as Prozac as outpatient.  In the context of presenting acute encephalopathy superimposed on baseline dementia, will hold his home antidepressants for now to allow for washout.  To limit any potential contributory role this may be playing in his acute encephalopathy.  Plan: Plan: Hold home Wellbutrin and Prozac for now, as above.  Check TSH.                #) GERD: documented h/o such; on omeprazole as outpatient.   Plan: Hold home PPI for now in the setting of presenting acute encephalopathy.Marland Kitchen               #) Essential Hypertension: documented h/o such, with outpatient antihypertensive regimen including amlodipine and doxazosin..  SBP's in the ED today: 140s to 150s mmHg.   Plan: Close monitoring of subsequent BP via routine VS. in the setting of presenting acute infectious process, will be  conservative and resumption of home hypertensive medication since her now.  Specifically, will hold home amlodipine for now, will presuming of doxazosin.      DVT prophylaxis: SCD's   Code Status: DNR (per code status documentation from most recent prior  hospitalization) Disposition Plan: Per Rounding Team Consults called: none;  Admission status: Inpatient     I SPENT GREATER THAN 75  MINUTES IN CLINICAL CARE TIME/MEDICAL DECISION-MAKING IN COMPLETING THIS ADMISSION.      [place] DO [facility]  From [place]   02/14/2022, 2:53 AM

## 2022-02-15 DIAGNOSIS — R4182 Altered mental status, unspecified: Secondary | ICD-10-CM | POA: Diagnosis not present

## 2022-02-15 DIAGNOSIS — N179 Acute kidney failure, unspecified: Secondary | ICD-10-CM | POA: Diagnosis not present

## 2022-02-15 DIAGNOSIS — T83511A Infection and inflammatory reaction due to indwelling urethral catheter, initial encounter: Secondary | ICD-10-CM | POA: Diagnosis not present

## 2022-02-15 DIAGNOSIS — N3001 Acute cystitis with hematuria: Secondary | ICD-10-CM | POA: Diagnosis not present

## 2022-02-15 DIAGNOSIS — Z66 Do not resuscitate: Secondary | ICD-10-CM | POA: Diagnosis not present

## 2022-02-15 DIAGNOSIS — G9341 Metabolic encephalopathy: Secondary | ICD-10-CM | POA: Diagnosis not present

## 2022-02-15 LAB — GLUCOSE, CAPILLARY
Glucose-Capillary: 99 mg/dL (ref 70–99)
Glucose-Capillary: 104 mg/dL — ABNORMAL HIGH (ref 70–99)
Glucose-Capillary: 94 mg/dL (ref 70–99)
Glucose-Capillary: 110 mg/dL — ABNORMAL HIGH (ref 70–99)

## 2022-02-15 LAB — CBC
HCT: 23.1 % — ABNORMAL LOW (ref 39.0–52.0)
Hemoglobin: 7.7 g/dL — ABNORMAL LOW (ref 13.0–17.0)
MCH: 30.2 pg (ref 26.0–34.0)
MCHC: 33.3 g/dL (ref 30.0–36.0)
MCV: 90.6 fL (ref 80.0–100.0)
Platelets: 100 K/uL — ABNORMAL LOW (ref 150–400)
RBC: 2.55 MIL/uL — ABNORMAL LOW (ref 4.22–5.81)
RDW: 15.9 % — ABNORMAL HIGH (ref 11.5–15.5)
WBC: 3.8 K/uL — ABNORMAL LOW (ref 4.0–10.5)
nRBC: 0 % (ref 0.0–0.2)

## 2022-02-15 LAB — BASIC METABOLIC PANEL WITH GFR
Anion gap: 8 (ref 5–15)
BUN: 13 mg/dL (ref 8–23)
CO2: 20 mmol/L — ABNORMAL LOW (ref 22–32)
Calcium: 8.2 mg/dL — ABNORMAL LOW (ref 8.9–10.3)
Chloride: 114 mmol/L — ABNORMAL HIGH (ref 98–111)
Creatinine, Ser: 1.51 mg/dL — ABNORMAL HIGH (ref 0.61–1.24)
GFR, Estimated: 48 mL/min — ABNORMAL LOW
Glucose, Bld: 83 mg/dL (ref 70–99)
Potassium: 2.9 mmol/L — ABNORMAL LOW (ref 3.5–5.1)
Sodium: 142 mmol/L (ref 135–145)

## 2022-02-15 LAB — MAGNESIUM: Magnesium: 1.7 mg/dL (ref 1.7–2.4)

## 2022-02-15 MED ORDER — POTASSIUM CHLORIDE CRYS ER 20 MEQ PO TBCR
40.0000 meq | EXTENDED_RELEASE_TABLET | ORAL | Status: AC
  Administered 2022-02-15 (×2): 40 meq via ORAL
  Filled 2022-02-15 (×2): qty 2

## 2022-02-15 NOTE — Evaluation (Signed)
Clinical/Bedside Swallow Evaluation Patient Details  Name: Gerald [name] MRN: [id number] Date of Birth: 1945-06-11  Today's Date: 02/15/2022 Time: SLP Start Time (ACUTE ONLY): 1356 SLP Stop Time (ACUTE ONLY): 1424 SLP Time Calculation (min) (ACUTE ONLY): 28 min  Past Medical History:  Past Medical History:  Diagnosis Date   Abnormality of gait 10/05/2014   Anemia    Anxiety    Bilateral edema of lower extremity    LE Dopplers 4/22 - no thrombus or thrombophelbitis, no reflux   Blood transfusion without reported diagnosis    with surgery 2012   Cataract    removed both eyes   CHF (congestive heart failure) ([name])    Chronic kidney disease    Colon polyp    DDD (degenerative disc disease)    Depression    DJD (degenerative joint disease)    DOE (dyspnea on exertion)    Echo 04 /22/2014 - normal EF, mild LVH, grade 1 Diastolic Dysfunction; Lexiscan Myoview 04/28/12 - no ishcemia or infarction, LBBB septal wall motion; EF ~60%   Fatigue    Fatigue    pt states he gets tired very quickly   GERD (gastroesophageal reflux disease)    H/O iron deficiency anemia    with  hemmorrhoids   Hyperlipidemia    Hypertension    Left bundle branch block     chronic, negative Myoview a normal echo as noted above    Osteoarthritis    with bilateral hip surgeries and multiple complication, with the most recent  surgery in January 2012   Scrotal edema    Type 2 diabetes mellitus without complications ([facility]) [id number]   Vitamin D deficiency    Past Surgical History:  Past Surgical History:  Procedure Laterality Date   Cardiology Nuclear Med Study   04/29/2012   overall impression ; NORMAL  STRESS  NUCLEAR STUDY   COLONOSCOPY  01/28/2010   HIP SURGERY Right 01/06/2002   replacement   HIP SURGERY Left 02/2010   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 male who presented to ED with altered mental status, progressive confusion and weakness over last 1 week. At baseline pt has Alzheimer's dementia, oriented to self and place, lives at home with his wife although family has been attempting to transition to memory care facility. CT head (2/9) revealed "no acute intracranial abnormality, ?Posterior midline occiput scalp hematoma, no underlying skull fracture". Esophagram (12/16/21) s/p esophageal dilation revealed "No esophageal mucosal irregularity, stricture, or mass. Mild esophageal dysmotility. No reflux or hernia". 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    Assessment / Plan / Recommendation  Clinical Impression  Pt presents with suspected primary oral dysphagia, likely related to baseline cognitive impairment as well as absent dentition. Oral mechanism examination WFL and pt independently placed upper denture (though ill fitting) prior to POs. No lower dentition or denture present. Thin liquids in single/consecutive straw sips were significant for intermittent belching, otherwise no clinical signs of aspiration. Prolonged mastication (2 minutes) exhibited with regular textured/dissolvable solid with ultimate clearance from oral cavity. He  reports consumption of soft solids at home, however with bites of mechanical soft (dys 3) meal tray items, prolonged mastication and ultimate expectoration of almost entire bolus observed (baked crusted tilapia). With manual modification/moisturization of meal item to roughly a dys 2 (ground) consistency, pt with notable improvement in mastication time and oral clearance. Cued liquid wash and lingual sweep did assist in removal of very  minimal residuals from oral cavity. Recommend dys 2 diet/thin liquids with 1:1 supervision during mealtimes to cue for swallow strategies. SLP to f/u for tolerance, training in compensatory strategies and swallow precautions.  SLP Visit Diagnosis: Dysphagia, unspecified (R13.10)    Aspiration Risk  Mild aspiration risk    Diet Recommendation Dysphagia 2 (Fine chop);Thin liquid   Liquid Administration via: Cup;Straw Medication Administration: Whole meds with liquid (or crushed/whole in puree as tolerated) Supervision: Patient able to self feed;Full supervision/cueing for compensatory strategies Compensations: Minimize environmental distractions;Slow rate;Small sips/bites;Follow solids with liquid Postural Changes: Seated upright at 90 degrees;Remain upright for at least 30 minutes after po intake    Other  Recommendations Oral Care Recommendations: Oral care BID    Recommendations for follow up therapy are one component of a multi-disciplinary discharge planning process, led by the attending physician.  Recommendations may be updated based on patient status, additional functional criteria and insurance authorization.  Follow up Recommendations Follow physician's recommendations for discharge plan and follow up therapies      Assistance Recommended at Discharge    Functional Status Assessment Patient has had a recent decline in their functional status and demonstrates the ability to make significant improvements in function in a reasonable and predictable amount of time.  Frequency and Duration min 2x/week  2 weeks       Prognosis Prognosis for improved oropharyngeal function: Good Barriers to Reach Goals: Cognitive deficits;Time post onset      Swallow Study   General Date of Onset: 02/13/22 HPI: Pt is a 77 y.o. male who presented to ED with altered mental status, progressive confusion and weakness over last 1 week. At baseline pt has Alzheimer's dementia, oriented to self and  place, lives at home with his wife although family has been attempting to transition to memory care facility. CT head (2/9) revealed "no acute intracranial abnormality, ?Posterior midline occiput scalp hematoma, no underlying skull fracture". Esophagram (12/16/21) s/p esophageal dilation revealed "No esophageal mucosal irregularity, stricture, or mass. Mild esophageal dysmotility. No reflux or hernia". 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 Type of Study: Bedside Swallow Evaluation Previous Swallow Assessment: none per EMR Diet Prior to this Study: Dysphagia 3 (mechanical soft);Thin liquids (Level 0) Temperature Spikes Noted: No Respiratory Status: Room air History of Recent Intubation: No Behavior/Cognition: Alert;Cooperative;Pleasant mood;Confused Oral Cavity Assessment: Within Functional Limits Oral Care Completed by SLP: No Oral Cavity - Dentition: Dentures, top Vision: Functional for self-feeding Self-Feeding Abilities: Able to feed self Patient Positioning: Upright in chair;Postural control adequate for testing Baseline Vocal Quality: Normal Volitional Cough: Strong Volitional Swallow: Able to elicit    Oral/Motor/Sensory Function Overall Oral Motor/Sensory Function: Within functional limits   Ice Chips Ice chips: Not tested   Thin Liquid Thin Liquid: Within functional limits Presentation: Self Fed;Straw    Nectar Thick Nectar Thick Liquid: Not tested   Honey Thick Honey Thick Liquid: Not tested   Puree Puree: Within functional limits Presentation: Self Fed;Spoon   Solid     Solid: Impaired  Presentation: Self Fed Oral Phase Impairments: Impaired mastication Oral Phase Functional Implications: Impaired mastication (expectoration of bolus/bolus particles)       Ellwood Dense, MA, [place] Office Number: [phone]  Acie Fredrickson 02/15/2022,2:44 PM

## 2022-02-15 NOTE — Progress Notes (Signed)
[facility]                                                                              Gerald [name], is a 77 y.o. 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 Over the course of last 1 week, patient has been having progressive confusion and weakness.  He was recently seen at [facility] long ED on 2/6 for urinary difficulty and Foley catheter was placed.  He was seen by [facility] on 2/7, catheter was replaced secondary to clogging of initial catheter by blood clots.  On admission, was significantly altered, aggressive and not recognizing his family. Vital signs stable, Sodium 145, bicarb 17, anion gap 12, creatinine 2.3, glucose 54, LFTs normal.  UA positive for UTI. CT abdomen pelvis showed no evidence of acute intra-abdominal process, no evidence of ureteral stones or hydroureteronephrosis. Patient was not added for further workup.   Assessment & Plan    Principal Problem:   Acute metabolic encephalopathy superimposed on advanced dementia -Likely precipitated due to UTI, dehydration, acute kidney injury -Continue IV Rocephin, follow urine culture and sensitivities  -Mental status appears improving, confirmed by the daughter at the bedside, continue Seroquel 25 mg qhs, added 12.5 mg qam -Continue sitter, delirium precautions -CT head showed no acute intracranial abnormality, ?  Posterior midline occiput scalp hematoma, no underlying  skull fracture.  Active Problems: Urinary tract infection, in the setting of recent Foley catheter placement -Follow urine culture and sensitivities, continue gentle hydration -Continue IV Rocephin   Acute kidney injury 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) -Baseline hemoglobin 1.6-1.9, was 1.98 on 11/22/2021 -Presented with creatinine of 2.30, was 2.0 on 2/6 -CT abdomen pelvis showed no renal or ureteral stones, no hydronephrosis.  No acute findings -Creatinine improving, 1.5 today, continue gentle hydration    DM2 (diabetes mellitus, type 2) ([name]) with hypoglycemia, complications of CKD stage IIIb -Likely due to poor p.o. intake, -Continue regular diet, D5 half-normal saline -Not on any oral hypoglycemics or insulin at home -[employer] (daughter requested SLP evaluation, changed to soft diet)     Essential hypertension -BP somewhat elevated, continue Norvasc, Cardura -Added hydralazine IV as needed with parameters  Recent urinary retention 2/6 status post Foley catheter placement -Continue Foley catheter, continue finasteride, Cardura -Outpatient follow-up with urology    GERD (gastroesophageal reflux disease) -Continue PPI    Depression, advanced dementia, failure to thrive -Mental status likely worsened due to UTI, generalized debility  -Continue Seroquel 2m qhs, added 12.5 mg every morning due to agitation -PT evaluation recommended SNF  Chronic diastolic CHF (congestive heart failure) ([name]) -Euvolemic, no acute decompensation, follow volume status -Currently on gentle hydration    Anemia of chronic disease -baseline of 8-9 -Hemoglobin 7.7 today, possibly has hemodilution, will recheck CBC in a.m.  Hypokalemia -K2.9, replaced   Prolonged QT -Acute PC 511, avoid QT prolonging medications -Follow Bmet, keep K ~4, Mg >2  Code Status: DNR DVT Prophylaxis:  SCDs Start: 02/14/22 0222   Level of Care: Level of care: Telemetry  Medical Family Communication:  disposition Plan:      Remains inpatient appropriate: Workup in progress   Procedures:    Consultants:   None  Antimicrobials:   Anti-infectives (From admission, onward)    Start     Dose/Rate Route Frequency Ordered Stop   02/14/22 0800  cefTRIAXone (ROCEPHIN) 1 g in sodium chloride 0.9 % 100 mL IVPB        1 g 200 mL/hr over 30 Minutes Intravenous Every 24 hours 02/14/22 0245     02/14/22 0045  piperacillin-tazobactam (ZOSYN) IVPB 3.375 g        3.375 g 100 mL/hr over 30 Minutes Intravenous  Once 02/14/22 0038 02/14/22 0149          Medications  atorvastatin  10 mg Oral QHS   Chlorhexidine Gluconate Cloth  6 each Topical Daily   doxazosin  8 mg Oral QHS   ferrous sulfate  325 mg Oral Q breakfast   finasteride  5 mg Oral Daily   QUEtiapine  12.5 mg Oral q AM   QUEtiapine  25 mg Oral QHS   rivastigmine  3 mg Oral BID WC   senna-docusate  1 tablet Oral BID      Subjective:   Gerald [name] was seen and examined today.  Much more alert and oriented today, daughter at the bedside.  Mental status improving, tolerating diet.  Daughter feels that he has been coughing with bites, requested SLP.  BP somewhat elevated.  Foley catheter with clear urine.    Objective:   Vitals:   02/14/22 0347 02/14/22 0512 02/14/22 0740 02/14/22 1117  BP:  (!) 160/88 (!) 160/78 (!) 166/73  Pulse:  92 85 82  Resp:  18 16 18  $ Temp: 98.5 F (36.9 C)     TempSrc: Oral     SpO2:  100% 100% 100%    Intake/Output Summary (Last 24 hours) at 02/14/2022 1242 Last data filed at 02/14/2022 1117 Gross per 24 hour  Intake 1307.4 ml  Output 1100 ml  Net 207.4 ml     Wt Readings from Last 3 Encounters:  12/07/21 64.4 kg  12/05/21 65.8 kg  11/22/21 64.4 kg    Physical Exam General: Alert and oriented x self, NAD, improving Cardiovascular: S1 S2 clear, RRR.  Respiratory: CTAB, no wheezing, rales or rhonchi Gastrointestinal: Soft, nontender, nondistended,  NBS Ext: no pedal edema bilaterally Neuro: no new deficits Psych: Normal affect, has underlying dementia, mental status improving       GU: Foley catheter with clear urine    Data Reviewed:  I have personally reviewed following labs    CBC Lab Results  Component Value Date   WBC 6.2 02/13/2022   RBC 2.86 (L) 02/13/2022   HGB 8.6 (L) 02/13/2022   HCT 26.0 (L) 02/13/2022   MCV 90.9 02/13/2022   MCH 30.1 02/13/2022   PLT 117 (L) 02/13/2022   MCHC 33.1 02/13/2022   RDW 16.0 (H) 02/13/2022   LYMPHSABS 0.8 02/13/2022   MONOABS 0.5 02/13/2022  EOSABS 0.1 02/13/2022   BASOSABS 0.0 [id number]     Last metabolic panel Lab Results  Component Value Date   NA 145 02/13/2022   K 3.6 02/13/2022   CL 116 (H) 02/13/2022   CO2 17 (L) 02/13/2022   BUN 31 (H) 02/13/2022   CREATININE 2.30 (H) 02/13/2022   GLUCOSE 54 (L) 02/13/2022   GFRNONAA 29 (L) 02/13/2022   GFRAA >60 09/29/2014   CALCIUM 8.9 02/13/2022   PROT 6.3 (L) 02/13/2022   ALBUMIN 3.3 (L) 02/13/2022   BILITOT 1.2 02/13/2022   ALKPHOS 56 02/13/2022   AST 41 02/13/2022   ALT 18 02/13/2022   ANIONGAP 12 02/13/2022    CBG (last 3)  Recent Labs    02/14/22 0729 02/14/22 0810 02/14/22 1104  GLUCAP 44* 113* 78      Coagulation Profile: No results for input(s): "INR", "PROTIME" in the last 168 hours.   Radiology Studies: I have personally reviewed the imaging studies  CT HEAD WO CONTRAST (5MM)  Result Date: 02/14/2022 CLINICAL DATA:  77 year old male with delirium. EXAM: CT HEAD WITHOUT CONTRAST TECHNIQUE: Contiguous axial images were obtained from the base of the skull through the vertex without intravenous contrast. RADIATION DOSE REDUCTION: This exam was performed according to the departmental dose-optimization program which includes automated exposure control, adjustment of the mA and/or kV according to patient size and/or use of iterative reconstruction technique. COMPARISON:  Head CT 09/13/2021, brain MRI  10/06/2020. FINDINGS: Brain: Motion artifact. Stable cerebral volume. No midline shift, mass effect, or evidence of intracranial mass lesion. No ventriculomegaly. When allowing for motion gray-white differentiation appears stable since last year. No convincing intracranial hemorrhage or cortically based infarct. Vascular: Calcified atherosclerosis at the skull base. No suspicious intracranial vascular hyperdensity. Skull: Motion artifact.  No acute osseous abnormality identified. Sinuses/Orbits: Visualized paranasal sinuses and mastoids are stable and well aerated. There is chronic maxillary sinus mucoperiosteal thickening, stable. Other: Motion artifact. Suspicion of midline occiput put scalp hematoma on series 4, image 34 up to 11 mm in thickness. Grossly stable and negative orbits. IMPRESSION: 1. Motion degraded exam. No acute intracranial abnormality identified. 2. Query posterior midline occiput scalp hematoma. No underlying skull fracture is evident. Electronically Signed   By: Genevie Ann M.D.   On: 02/14/2022 07:06   CT ABDOMEN PELVIS WO CONTRAST  Result Date: 02/14/2022 CLINICAL DATA:  Microscopic hematuria EXAM: CT ABDOMEN AND PELVIS WITHOUT CONTRAST TECHNIQUE: Multidetector CT imaging of the abdomen and pelvis was performed following the standard protocol without IV contrast. RADIATION DOSE REDUCTION: This exam was performed according to the departmental dose-optimization program which includes automated exposure control, adjustment of the mA and/or kV according to patient size and/or use of iterative reconstruction technique. COMPARISON:  03/06/2010.  Ultrasound 02/11/2022 FINDINGS: Lower chest: No acute abnormality. Coronary artery and aortic calcifications. Hepatobiliary: No focal hepatic abnormality. Gallbladder unremarkable. Pancreas: No focal abnormality or ductal dilatation. Spleen: No focal abnormality.  Normal size. Adrenals/Urinary Tract: 2.2 cm left parapelvic cyst. No follow-up imaging  recommended. No renal or ureteral stones. No hydronephrosis. Adrenal glands unremarkable. Foley catheter in the bladder which is mildly distended. Stomach/Bowel: Stomach, large and small bowel grossly unremarkable. Vascular/Lymphatic: Diffuse aortoiliac atherosclerosis. No evidence of aneurysm or adenopathy. Reproductive: No visible focal abnormality. Other: No free fluid or free air. 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 Advanced degenerative changes in the lumbar spine. No acute bony abnormality. IMPRESSION: No renal or ureteral stones.  No  hydronephrosis. Aortoiliac atherosclerosis, coronary artery disease. 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 No acute findings. Electronically Signed   By: Rolm Baptise M.D.   On: 02/14/2022 01:05       Jahzeel Poythress M.D. [facility] 02/14/2022, 12:42 PM  Available via Epic secure chat 7am-7pm After 7 pm, please refer to night coverage provider listed on amion.

## 2022-02-16 DIAGNOSIS — G9341 Metabolic encephalopathy: Secondary | ICD-10-CM | POA: Diagnosis not present

## 2022-02-16 DIAGNOSIS — N3001 Acute cystitis with hematuria: Secondary | ICD-10-CM | POA: Diagnosis not present

## 2022-02-16 DIAGNOSIS — N179 Acute kidney failure, unspecified: Secondary | ICD-10-CM | POA: Diagnosis not present

## 2022-02-16 DIAGNOSIS — R4182 Altered mental status, unspecified: Secondary | ICD-10-CM | POA: Diagnosis not present

## 2022-02-16 LAB — CBC
HCT: 24.1 % — ABNORMAL LOW (ref 39.0–52.0)
Hemoglobin: 8.1 g/dL — ABNORMAL LOW (ref 13.0–17.0)
MCH: 30.1 pg (ref 26.0–34.0)
MCHC: 33.6 g/dL (ref 30.0–36.0)
MCV: 89.6 fL (ref 80.0–100.0)
Platelets: 98 10*3/uL — ABNORMAL LOW (ref 150–400)
RBC: 2.69 MIL/uL — ABNORMAL LOW (ref 4.22–5.81)
RDW: 15.7 % — ABNORMAL HIGH (ref 11.5–15.5)
WBC: 3.7 10*3/uL — ABNORMAL LOW (ref 4.0–10.5)
nRBC: 0 % (ref 0.0–0.2)

## 2022-02-16 LAB — URINE CULTURE: Culture: 50000 — AB

## 2022-02-16 LAB — BASIC METABOLIC PANEL
Anion gap: 7 (ref 5–15)
BUN: 12 mg/dL (ref 8–23)
CO2: 20 mmol/L — ABNORMAL LOW (ref 22–32)
Calcium: 8.1 mg/dL — ABNORMAL LOW (ref 8.9–10.3)
Chloride: 111 mmol/L (ref 98–111)
Creatinine, Ser: 1.47 mg/dL — ABNORMAL HIGH (ref 0.61–1.24)
GFR, Estimated: 49 mL/min — ABNORMAL LOW (ref >60)
Glucose, Bld: 87 mg/dL (ref 70–99)
Potassium: 4.2 mmol/L (ref 3.5–5.1)
Sodium: 138 mmol/L (ref 135–145)

## 2022-02-16 LAB — GLUCOSE, CAPILLARY
Glucose-Capillary: 83 mg/dL (ref 70–99)
Glucose-Capillary: 75 mg/dL (ref 70–99)
Glucose-Capillary: 92 mg/dL (ref 70–99)
Glucose-Capillary: 94 mg/dL (ref 70–99)

## 2022-02-16 LAB — PROCALCITONIN: Procalcitonin: <0.1 ng/mL

## 2022-02-16 NOTE — Evaluation (Signed)
Physical Therapy Evaluation Patient Details Name: Gerald [name] MRN: [id number] DOB: April 10, 1945 Today's Date: 02/16/2022  History of Present Illness  77 year old male who presented to the ED with AMS. UA positive for UTI. Of note, he recently seen at [facility] long ED on 2/6 for urinary difficulty and Foley catheter was placed.  He was seen by [facility] on 2/[facility], catheter was replaced secondary to clogging of initial catheter by blood clots.  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  Pt lives at home with his wife and normally able to assist with ADLs but has needed more help recently per notes. Uses RW for ambulation. Today, pt tolerated transfers and gait training with Min guard-Min A for safety and use of RW. Noted to have some dizziness, which likely is from low blood sugar which CNA checked prior to session. Able to transfer to chair and set pt up for breakfast. Per notes, family is working on transitioning pt to memory care. Will follow acutely to maximize independence and mobility prior to return home.     Recommendations for follow up therapy are one component of a multi-disciplinary discharge planning process, led by the attending physician.  Recommendations may be updated based on patient status, additional functional criteria and insurance authorization.  Follow Up Recommendations Home health PT (family working towards transitioning to memory care)      Assistance Recommended at Discharge Frequent or constant Supervision/Assistance  Patient can return home with the following  A little help with walking and/or transfers;A little help with bathing/dressing/bathroom;Assist for transportation;Assistance with cooking/housework;Direct supervision/assist for  medications management;Direct supervision/assist for financial management    Equipment Recommendations None recommended by PT  Recommendations for Other Services       Functional Status Assessment Patient has had a recent decline in their functional status and demonstrates the ability to make significant improvements in function in a reasonable and predictable amount of time.     Precautions / Restrictions Precautions Precautions: Fall Precaution Comments: baseline dementia, family looking to transition to memory care Restrictions Weight Bearing Restrictions: No      Mobility  Bed Mobility Overal bed mobility: Needs Assistance Bed Mobility: Supine to Sit     Supine to sit: Supervision, HOB elevated     General bed mobility comments: No assist needed.    Transfers Overall transfer level: Needs assistance Equipment used: Rolling walker (2 wheels) Transfers: Sit to/from Stand Sit to Stand: Min assist           General transfer comment: cues for hand placement/technique, stood from EOB x1, transferred to chair post ambulation.    Ambulation/Gait Ambulation/Gait assistance: Min guard, Min assist Gait Distance (Feet): 20 Feet Assistive device: Rolling walker (2 wheels) Gait Pattern/deviations: Step-to pattern, Antalgic, Knee flexed in stance - left Gait velocity: decreased Gait velocity interpretation: <1.31 ft/sec, indicative of household ambulator   General Gait Details: Slow, mildly unsteady gait due to leg length discrepancy (LLE shorter than RLE), Min A for balance with turns otherwise Min guard. Slightly dizzy, likely due to low blood sugar  Stairs            Wheelchair Mobility    Modified Rankin (Stroke Patients Only)       Balance Overall balance assessment: Needs assistance Sitting-balance support: Feet supported, No upper extremity supported Sitting balance-Leahy Scale:  Good Sitting balance - Comments: Able to reach outside BoS and adjust  socks with increased time, no LOB   Standing balance support: During functional activity, Bilateral upper extremity supported Standing balance-Leahy Scale: Poor                               Pertinent Vitals/Pain Pain Assessment Pain Assessment: No/denies pain    Home Living Family/patient expects to be discharged to:: Private residence Living Arrangements: Spouse/significant other;Children Available Help at Discharge: Family;Available 24 hours/day Type of Home: House Home Access: Ramped entrance;Stairs to enter   Entrance Stairs-Number of Steps: 3 step with rail vs ramp   Home Layout: One level Home Equipment: Conservation officer, nature (2 wheels);Cane - single point;Shower seat;Wheelchair - manual;Grab bars - tub/shower      Prior Function Prior Level of Function : Independent/Modified Independent             Mobility Comments: Pt reports he has been using RW recently ADLs Comments: usually does it on his own, but has needed help recently     Hand Dominance   Dominant Hand: Right    Extremity/Trunk Assessment   Upper Extremity Assessment Upper Extremity Assessment: Defer to OT evaluation    Lower Extremity Assessment Lower Extremity Assessment: Generalized weakness;LLE deficits/detail LLE Deficits / Details: LLE shorter than RLE at baseline    Cervical / Trunk Assessment Cervical / Trunk Assessment: Kyphotic  Communication   Communication: No difficulties  Cognition Arousal/Alertness: Awake/alert Behavior During Therapy: Flat affect, WFL for tasks assessed/performed Overall Cognitive Status: History of cognitive impairments - at baseline                                 General Comments: oriented x4 (though initially though he was at [facility]), pleasant and appropriate.        General Comments General comments (skin integrity, edema, etc.): VSS on RA.    Exercises     Assessment/Plan    PT Assessment Patient needs continued PT services   PT Problem List Decreased strength;Decreased mobility;Decreased balance;Decreased cognition;Decreased range of motion;Decreased safety awareness       PT Treatment Interventions Therapeutic activities;Gait training;Therapeutic exercise;Patient/family education;Balance training;Functional mobility training    PT Goals (Current goals can be found in the Care Plan section)  Acute Rehab PT Goals Patient Stated Goal: eat some food PT Goal Formulation: With patient Time For Goal Achievement: 03/02/22 Potential to Achieve Goals: Fair    Frequency Min 3X/week     Co-evaluation PT/OT/SLP Co-Evaluation/Treatment: Yes Reason for Co-Treatment: For patient/therapist safety;Necessary to address cognition/behavior during functional activity PT goals addressed during session: Mobility/safety with mobility;Balance;Strengthening/ROM;Proper use of DME OT goals addressed during session: ADL's and self-care       AM-PAC PT "6 Clicks" Mobility  Outcome Measure Help needed turning from your back to your side while in a flat bed without using bedrails?: A Little Help needed moving from lying on your back to sitting on the side of a flat bed without using bedrails?: A Little Help needed moving to and from a bed to a chair (including a wheelchair)?: A Little Help needed standing up from a chair using your arms (e.g., wheelchair or bedside chair)?: A Little Help needed to walk in hospital room?: A Little Help needed climbing 3-5 steps with a railing? : A Lot 6 Click Score: 17    End of Session  Equipment Utilized During Treatment: Gait belt Activity Tolerance: Patient tolerated treatment well;Other (comment) (dizziness, likely due to low blood sugar, CNA checked it 72 prior to session) Patient left: in chair;with call bell/phone within reach;with chair alarm set (posey belt alarm) Nurse Communication: Mobility status PT Visit Diagnosis: Difficulty in walking, not elsewhere classified (R26.2);Muscle  weakness (generalized) (M62.81);Unsteadiness on feet (R26.81)    Time: [id number] PT Time Calculation (min) (ACUTE ONLY): 20 min   Charges:   PT Evaluation $PT Eval Moderate Complexity: 1 Mod          Marisa Severin, PT, DPT Acute Rehabilitation Services Secure chat preferred Office [phone]     Gerald [name] A Eily Louvier 02/16/2022, 2:58 PM

## 2022-02-16 NOTE — Evaluation (Signed)
Occupational Therapy Evaluation Patient Details Name: Gerald [name] MRN: [id number] DOB: 11-24-1945 Today's Date: 02/16/2022   History of Present Illness 77 year old male who presented to the ED with AMS. UA positive for UTI. Of note, he recently seen at [facility] long ED on 2/6 for urinary difficulty and Foley catheter was placed.  [facility] was [facility] by [facility] on 2/[facility], catheter was replaced secondary to clogging of initial catheter by blood clots.  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 Per chart, pt has needed increased assist due to AMS and confusion lately. Upon evaluation pt was alert, oriented, pleasant and followed all commands appropriately. Overall he required up to min A for mobility and ADLs with RW. Cues given for safety, and he was limited by activity tolerance. OT to continue to follow acutely. Recommend d/c home with support of family as he is likely near is functional baseline.      Recommendations for follow up therapy are one component of a multi-disciplinary discharge planning process, led by the attending physician.  Recommendations may be updated based on patient status, additional functional criteria and insurance authorization.   Follow Up Recommendations  Home health OT (pt's family working toward transitioning pt to memory care)     Assistance Recommended at Discharge Frequent or constant Supervision/Assistance  Patient can return home with the following A little help with walking and/or transfers;A little help with bathing/dressing/bathroom;Direct supervision/assist for medications management;Direct supervision/assist for financial management;Assistance with cooking/housework;Assist for transportation     Functional Status Assessment  Patient has had a recent decline in their functional status and demonstrates the ability to make significant improvements in function in a reasonable and predictable amount of time.  Equipment Recommendations  None recommended by OT       Precautions / Restrictions Precautions Precautions: Fall Precaution Comments: baseline dementia, family looking to transition to memory care Restrictions Weight Bearing Restrictions: No      Mobility Bed Mobility Overal bed mobility: Needs Assistance Bed Mobility: Supine to Sit     Supine to sit: Supervision          Transfers Overall transfer level: Needs assistance Equipment used: Rolling walker (2 wheels) Transfers: Sit to/from Stand Sit to Stand: Min assist           General transfer comment: cue for hand placement      Balance Overall balance assessment: Needs assistance Sitting-balance support: Feet supported Sitting balance-Leahy Scale: Good     Standing balance support: Bilateral upper extremity supported, During functional activity Standing balance-Leahy Scale: Poor                             ADL either performed or assessed with clinical judgement   ADL Overall ADL's : Needs assistance/impaired Eating/Feeding: Independent;Sitting Eating/Feeding Details (indicate cue type and reason): eat at the end of the session Grooming: Min guard;Standing   Upper Body Bathing: Set up;Sitting   Lower Body Bathing: Minimal assistance;Sit to/from stand   Upper Body Dressing : Set up;Sitting   Lower Body Dressing: Minimal assistance;Sit to/from stand   Toilet Transfer: Minimal assistance;Ambulation;Rolling walker (2 wheels)   Toileting- Clothing Manipulation and Hygiene: Supervision/safety;Sitting/lateral lean  Functional mobility during ADLs: Minimal assistance;Rolling walker (2 wheels) General ADL Comments: activity tolerance, baseline cog, unsteady     Vision  Baseline Vision/History: 0 No visual deficits Vision Assessment?: No apparent visual deficits     Perception Perception Perception Tested?: No   Praxis Praxis Praxis tested?: Not tested    Pertinent Vitals/Pain Pain Assessment Pain Assessment: No/denies pain     Hand Dominance Right   Extremity/Trunk Assessment Upper Extremity Assessment Upper Extremity Assessment: Generalized weakness   Lower Extremity Assessment Lower Extremity Assessment: Defer to PT evaluation   Cervical / Trunk Assessment Cervical / Trunk Assessment: Kyphotic (mild)   Communication Communication Communication: No difficulties   Cognition Arousal/Alertness: Awake/alert Behavior During Therapy: Flat affect, WFL for tasks assessed/performed Overall Cognitive Status: History of cognitive impairments - at baseline         General Comments: oriented x4 (though initially though he was at [facility]), pleasant and appropriate.     General Comments  VSS on RA, per NT report pt blood glucose was 72 at the start of the session. pt with report of mild dizziness            Home Living Family/patient expects to be discharged to:: Private residence Living Arrangements: Spouse/significant other;Children Available Help at Discharge: Family;Available 24 hours/day Type of Home: House Home Access: Ramped entrance;Stairs to enter Entrance Stairs-Number of Steps: 3 step with rail vs ramp   Home Layout: One level     Bathroom Shower/Tub: Tub/shower unit         Home Equipment: Conservation officer, nature (2 wheels);Cane - single point;Shower seat;Wheelchair - manual;Grab bars - tub/shower          Prior Functioning/Environment Prior Level of Function : Independent/Modified Independent             Mobility Comments: Pt reports he has been using RW recently ADLs Comments: usually does it on his own, but has needed help recently        OT Problem List: Decreased strength;Decreased range of motion;Decreased  activity tolerance;Impaired balance (sitting and/or standing);Decreased cognition;Decreased safety awareness      OT Treatment/Interventions: Self-care/ADL training;Therapeutic exercise;DME and/or AE instruction;Therapeutic activities;Balance training    OT Goals(Current goals can be found in the care plan section) Acute Rehab OT Goals Patient Stated Goal: to get better OT Goal Formulation: With patient Time For Goal Achievement: 03/02/22 Potential to Achieve Goals: Good ADL Goals Pt Will Perform Grooming: with supervision;standing Pt Will Perform Upper Body Dressing: with supervision Pt Will Perform Lower Body Dressing: with supervision;sit to/from stand Pt Will Transfer to Toilet: with supervision;ambulating  OT Frequency: Min 2X/week    Co-evaluation PT/OT/SLP Co-Evaluation/Treatment: Yes Reason for Co-Treatment: For patient/therapist safety;Necessary to address cognition/behavior during functional activity   OT goals addressed during session: ADL's and self-care      AM-PAC OT "6 Clicks" Daily Activity     Outcome Measure Help from another person eating meals?: None Help from another person taking care of personal grooming?: A Little Help from another person toileting, which includes using toliet, bedpan, or urinal?: A Little Help from another person bathing (including washing, rinsing, drying)?: A Little Help from another person to put on and taking off regular upper body clothing?: None Help from another person to put on and taking off regular lower body clothing?: A Little 6 Click Score: 20   End of Session Equipment Utilized During Treatment: Rolling walker (2 wheels) Nurse Communication: Mobility status  Activity Tolerance: Patient tolerated treatment well Patient left: in chair;with  call bell/phone within reach;with chair alarm set  OT Visit Diagnosis: Unsteadiness on feet (R26.81);Other abnormalities of gait and mobility (R26.89);Muscle weakness (generalized)  (M62.81)                Time: [id number] OT Time Calculation (min): 21 min Charges:  OT General Charges $OT Visit: 1 Visit OT Evaluation $OT Eval Moderate Complexity: 1 Mod   Everlena Mackley D Causey 02/16/2022, 1:22 PM

## 2022-02-16 NOTE — Progress Notes (Signed)
[facility]                                                                              Gerald [name], is a 77 y.o. 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 Over the course of last 1 week, patient has been having progressive confusion and weakness.  He was recently seen at [facility] long ED on 2/6 for urinary difficulty and Foley catheter was placed.  [facility] was [facility] by [facility] on 2/[facility], catheter was replaced secondary to clogging of initial catheter by blood clots.  On admission, was significantly altered, aggressive and not recognizing his family. Vital signs stable, Sodium 145, bicarb 17, anion gap 12, creatinine 2.3, glucose 54, LFTs normal.  UA positive for UTI. CT abdomen pelvis showed no evidence of acute intra-abdominal process, no evidence of ureteral stones or hydroureteronephrosis. Patient was not added for further workup.   Assessment & Plan    Principal Problem:   Acute metabolic encephalopathy superimposed on advanced dementia -Likely precipitated due to UTI, dehydration, acute kidney injury -Continue IV Rocephin, follow urine culture and sensitivities.   -Mental status improving, continue Seroquel 25 mg qhs, added 12.5 mg qam -Continue sitter, delirium precautions -CT head showed no acute intracranial abnormality, ?  Posterior midline occiput scalp hematoma, no underlying skull fracture.  Active Problems: Urinary tract  infection, in the setting of recent Foley catheter placement -Urine culture showed 50,000 colonies of staph hemolyticus -Continue IV Rocephin, follow sensitivities   Acute kidney injury 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) -Baseline hemoglobin 1.6-1.9, was 1.98 on 11/22/2021 -Presented with creatinine of 2.30, was 2.0 on 2/6 -CT abdomen pelvis showed no renal or ureteral stones, no hydronephrosis.  No acute findings -Creatinine improving, 1.4    DM2 (diabetes mellitus, type 2) ([name]) with hypoglycemia, complications of CKD stage IIIb -Likely due to poor p.o. intake, -Continue dysphagia 2 diet, SLP evaluation on 2/10  -CBG stable    Essential hypertension -BP somewhat elevated, continue Norvasc, Cardura -Added hydralazine IV as needed with parameters  Recent urinary retention 2/6 status post Foley catheter placement -Continue Foley catheter, continue finasteride, Cardura -Outpatient follow-up with urology    GERD (gastroesophageal reflux disease) -Continue PPI    Depression, advanced dementia, failure to thrive -Mental status likely worsened due to UTI, generalized debility  -Continue Seroquel 52m qhs, added 12.5 mg every [name] due to agitation -PT evaluation recommended SNF    Chronic diastolic CHF (congestive heart failure) ([name]) -Euvolemic, no acute decompensation, follow volume status -Currently on gentle hydration    Anemia  of chronic disease -baseline of 8-9 -Hemoglobin 8.1, at baseline  Hypokalemia -Resolved, replete as needed  Prolonged QT -Acute PC 511, avoid QT prolonging medications -Follow Bmet, keep K ~4, Mg >2  Code Status: DNR DVT Prophylaxis:  SCDs Start: 02/14/22 0222   Level of Care: Level of care: Telemetry Medical Family Communication: Discussed with patient's daughter at the bedside on 2/10 disposition Plan:      Remains inpatient appropriate: Workup in progress   Procedures:    Consultants:   None  Antimicrobials:    Anti-infectives (From admission, onward)    Start     Dose/Rate Route Frequency Ordered Stop   02/14/22 0800  cefTRIAXone (ROCEPHIN) 1 g in sodium chloride 0.9 % 100 mL IVPB        1 g 200 mL/hr over 30 Minutes Intravenous Every 24 hours 02/14/22 0245     02/14/22 0045  piperacillin-tazobactam (ZOSYN) IVPB 3.375 g        3.375 g 100 mL/hr over 30 Minutes Intravenous  Once 02/14/22 0038 02/14/22 0149          Medications  atorvastatin  10 mg Oral QHS   Chlorhexidine Gluconate Cloth  6 each Topical Daily   doxazosin  8 mg Oral QHS   ferrous sulfate  325 mg Oral Q breakfast   finasteride  5 mg Oral Daily   QUEtiapine  12.5 mg Oral q AM   QUEtiapine  25 mg Oral QHS   rivastigmine  3 mg Oral BID WC   senna-docusate  1 tablet Oral BID      Subjective:   Gerald [name] was seen and examined today.  Much more alert and oriented, denies any specific complaints.  Tolerating diet.    Foley catheter with clear urine.    Objective:   Vitals:   02/14/22 0347 02/14/22 0512 02/14/22 0740 02/14/22 1117  BP:  (!) 160/88 (!) 160/78 (!) 166/73  Pulse:  92 85 82  Resp:  18 16 18  $ Temp: 98.5 F (36.9 C)     TempSrc: Oral     SpO2:  100% 100% 100%    Intake/Output Summary (Last 24 hours) at 02/14/2022 1242 Last data filed at 02/14/2022 1117 Gross per 24 hour  Intake 1307.4 ml  Output 1100 ml  Net 207.4 ml     Wt Readings from Last 3 Encounters:  12/07/21 64.4 kg  12/05/21 65.8 kg  11/22/21 64.4 kg   Physical Exam General: Alert and oriented x 3, NAD Cardiovascular: S1 S2 clear, RRR.  Respiratory: CTAB, no wheezing Gastrointestinal: Soft, nontender, nondistended, NBS Ext: no pedal edema bilaterally Neuro: no new deficits Skin: No rashes Psych: underlying dementia GU: Foley catheter    Data Reviewed:  I have personally reviewed following labs    CBC Lab Results  Component Value Date   WBC 6.2 02/13/2022   RBC 2.86 (L) 02/13/2022   HGB 8.6 (L) 02/13/2022   HCT  26.0 (L) 02/13/2022   MCV 90.9 02/13/2022   MCH 30.1 02/13/2022   PLT 117 (L) 02/13/2022   MCHC 33.1 02/13/2022   RDW 16.0 (H) 02/13/2022   LYMPHSABS 0.8 02/13/2022   MONOABS 0.5 02/13/2022   EOSABS 0.1 02/13/2022   BASOSABS 0.0 [id number]     Last metabolic panel Lab Results  Component Value Date   NA 145 02/13/2022   K 3.6 02/13/2022   CL 116 (H) 02/13/2022   CO2 17 (L) 02/13/2022   BUN 31 (H) 02/13/2022   CREATININE 2.30 (H)  02/13/2022   GLUCOSE 54 (L) 02/13/2022   GFRNONAA 29 (L) 02/13/2022   GFRAA >60 09/29/2014   CALCIUM 8.9 02/13/2022   PROT 6.3 (L) 02/13/2022   ALBUMIN 3.3 (L) 02/13/2022   BILITOT 1.2 02/13/2022   ALKPHOS 56 02/13/2022   AST 41 02/13/2022   ALT 18 02/13/2022   ANIONGAP 12 02/13/2022    CBG (last 3)  Recent Labs    02/14/22 0729 02/14/22 0810 02/14/22 1104  GLUCAP 44* 113* 78      Coagulation Profile: No results for input(s): "INR", "PROTIME" in the last 168 hours.   Radiology Studies: I have personally reviewed the imaging studies  CT HEAD WO CONTRAST (5MM)  Result Date: 02/14/2022 CLINICAL DATA:  77 year old male with delirium. EXAM: CT HEAD WITHOUT CONTRAST TECHNIQUE: Contiguous axial images were obtained from the base of the skull through the vertex without intravenous contrast. RADIATION DOSE REDUCTION: This exam was performed according to the departmental dose-optimization program which includes automated exposure control, adjustment of the mA and/or kV according to patient size and/or use of iterative reconstruction technique. COMPARISON:  Head CT 09/13/2021, brain MRI 10/06/2020. FINDINGS: Brain: Motion artifact. Stable cerebral volume. No midline shift, mass effect, or evidence of intracranial mass lesion. No ventriculomegaly. When allowing for motion gray-white differentiation appears stable since last year. No convincing intracranial hemorrhage or cortically based infarct. Vascular: Calcified atherosclerosis at the skull base. No  suspicious intracranial vascular hyperdensity. Skull: Motion artifact.  No acute osseous abnormality identified. Sinuses/Orbits: Visualized paranasal sinuses and mastoids are stable and well aerated. There is chronic maxillary sinus mucoperiosteal thickening, stable. Other: Motion artifact. Suspicion of midline occiput put scalp hematoma on series 4, image 34 up to 11 mm in thickness. Grossly stable and negative orbits. IMPRESSION: 1. Motion degraded exam. No acute intracranial abnormality identified. 2. Query posterior midline occiput scalp hematoma. No underlying skull fracture is evident. Electronically Signed   By: Genevie Ann M.D.   On: 02/14/2022 07:06   CT ABDOMEN PELVIS WO CONTRAST  Result Date: 02/14/2022 CLINICAL DATA:  Microscopic hematuria EXAM: CT ABDOMEN AND PELVIS WITHOUT CONTRAST TECHNIQUE: Multidetector CT imaging of the abdomen and pelvis was performed following the standard protocol without IV contrast. RADIATION DOSE REDUCTION: This exam was performed according to the departmental dose-optimization program which includes automated exposure control, adjustment of the mA and/or kV according to patient size and/or use of iterative reconstruction technique. COMPARISON:  03/06/2010.  Ultrasound 02/11/2022 FINDINGS: Lower chest: No acute abnormality. Coronary artery and aortic calcifications. Hepatobiliary: No focal hepatic abnormality. Gallbladder unremarkable. Pancreas: No focal abnormality or ductal dilatation. Spleen: No focal abnormality.  Normal size. Adrenals/Urinary Tract: 2.2 cm left parapelvic cyst. No follow-up imaging recommended. No renal or ureteral stones. No hydronephrosis. Adrenal glands unremarkable. Foley catheter in the bladder which is mildly distended. Stomach/Bowel: Stomach, large and small bowel grossly unremarkable. Vascular/Lymphatic: Diffuse aortoiliac atherosclerosis. No evidence of aneurysm or adenopathy. Reproductive: No visible focal abnormality. Other: No free fluid or  free air. 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 Advanced degenerative changes in the lumbar spine. No acute bony abnormality. IMPRESSION: No renal or ureteral stones.  No hydronephrosis. Aortoiliac atherosclerosis, coronary artery disease. 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 No acute findings. Electronically Signed   By: Rolm Baptise M.D.   On: 02/14/2022 01:05       Gerald [name] M.D. [facility] 02/14/2022, 12:42 PM  Available via Epic secure chat 7am-7pm After 7 pm,  please refer to night coverage provider listed on amion.

## 2022-02-17 ENCOUNTER — Encounter (HOSPITAL_COMMUNITY): Payer: Medicare Other

## 2022-02-17 DIAGNOSIS — N179 Acute kidney failure, unspecified: Secondary | ICD-10-CM | POA: Diagnosis not present

## 2022-02-17 DIAGNOSIS — R4182 Altered mental status, unspecified: Secondary | ICD-10-CM | POA: Diagnosis not present

## 2022-02-17 DIAGNOSIS — G9341 Metabolic encephalopathy: Secondary | ICD-10-CM | POA: Diagnosis not present

## 2022-02-17 DIAGNOSIS — N3001 Acute cystitis with hematuria: Secondary | ICD-10-CM | POA: Diagnosis not present

## 2022-02-17 LAB — CBC
HCT: 24.3 % — ABNORMAL LOW (ref 39.0–52.0)
Hemoglobin: 7.9 g/dL — ABNORMAL LOW (ref 13.0–17.0)
MCH: 29.8 pg (ref 26.0–34.0)
MCHC: 32.5 g/dL (ref 30.0–36.0)
MCV: 91.7 fL (ref 80.0–100.0)
Platelets: 91 10*3/uL — ABNORMAL LOW (ref 150–400)
RBC: 2.65 MIL/uL — ABNORMAL LOW (ref 4.22–5.81)
RDW: 15.7 % — ABNORMAL HIGH (ref 11.5–15.5)
WBC: 4.1 10*3/uL (ref 4.0–10.5)
nRBC: 0 % (ref 0.0–0.2)

## 2022-02-17 LAB — BASIC METABOLIC PANEL
Anion gap: 7 (ref 5–15)
BUN: 10 mg/dL (ref 8–23)
CO2: 20 mmol/L — ABNORMAL LOW (ref 22–32)
Calcium: 8 mg/dL — ABNORMAL LOW (ref 8.9–10.3)
Chloride: 109 mmol/L (ref 98–111)
Creatinine, Ser: 1.4 mg/dL — ABNORMAL HIGH (ref 0.61–1.24)
GFR, Estimated: 52 mL/min — ABNORMAL LOW (ref >60)
Glucose, Bld: 96 mg/dL (ref 70–99)
Potassium: 3.9 mmol/L (ref 3.5–5.1)
Sodium: 136 mmol/L (ref 135–145)

## 2022-02-17 LAB — GLUCOSE, CAPILLARY
Glucose-Capillary: 71 mg/dL (ref 70–99)
Glucose-Capillary: 90 mg/dL (ref 70–99)
Glucose-Capillary: 88 mg/dL (ref 70–99)
Glucose-Capillary: 102 mg/dL — ABNORMAL HIGH (ref 70–99)

## 2022-02-17 MED ORDER — AMOXICILLIN 500 MG PO CAPS
1000.0000 mg | ORAL_CAPSULE | Freq: Three times a day (TID) | ORAL | Status: DC
  Administered 2022-02-17 – 2022-02-18 (×3): 1000 mg via ORAL
  Filled 2022-02-17 (×5): qty 2

## 2022-02-17 MED ORDER — DOXYCYCLINE HYCLATE 100 MG PO TABS
100.0000 mg | ORAL_TABLET | Freq: Two times a day (BID) | ORAL | Status: DC
  Administered 2022-02-17: 100 mg via ORAL
  Filled 2022-02-17: qty 1

## 2022-02-17 MED ORDER — EPOETIN ALFA 10000 UNIT/ML IJ SOLN
10000.0000 [IU] | Freq: Once | INTRAMUSCULAR | Status: AC
  Administered 2022-02-17: 10000 [IU] via SUBCUTANEOUS
  Filled 2022-02-17: qty 1

## 2022-02-17 MED ORDER — EPOETIN ALFA-EPBX 10000 UNIT/ML IJ SOLN
10000.0000 [IU] | Freq: Once | INTRAMUSCULAR | Status: DC
  Filled 2022-02-17: qty 1

## 2022-02-17 NOTE — Progress Notes (Signed)
Physical Therapy Treatment Patient Details Name: Gerald [name] MRN: [id number] DOB: 1945/05/26 Today's Date: 02/17/2022   History of Present Illness 77 year old male who presented to the ED with AMS. UA positive for UTI. Of note, he recently seen at [facility] long ED on 2/6 for urinary difficulty and Foley catheter was placed.  He was seen by [facility] on 2/[facility], catheter was replaced secondary to clogging of initial catheter by blood clots.  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  Pt is unable to problem solve for IV, for obstacles and has repetitive behavior such as doing a full hand wash twice.  Recommend him to HHPT only if he has family to observe him for safety, but currently is unsafe unless supervised 24/7 including all gait and transfers.  Pt is truly unable to make safe decisions currently, nearly pulled out his IV as PT was entering his room.  Follow for goals of acute PT as are outlined on POC.   Recommendations for follow up therapy are one component of a multi-disciplinary discharge planning process, led by the attending physician.  Recommendations may be updated based on patient status, additional functional criteria and insurance authorization.  Follow Up Recommendations  Skilled nursing-short term rehab (<3 hours/day)     Assistance Recommended at Discharge Frequent or constant Supervision/Assistance  Patient can return home with the following A little help with walking and/or transfers;A little help with bathing/dressing/bathroom;Assist for transportation;Assistance with cooking/housework;Direct supervision/assist for medications management;Direct supervision/assist for financial management   Equipment Recommendations  None recommended by PT     Recommendations for Other Services       Precautions / Restrictions Precautions Precautions: Fall Precaution Comments: baseline dementia, family looking to transition to memory care Restrictions Weight Bearing Restrictions: No (Simultaneous filing. User may not have seen previous data.)     Mobility  Bed Mobility               General bed mobility comments: No assist needed.    Transfers Overall transfer level: Needs assistance Equipment used: Rolling walker (2 wheels) Transfers: Sit to/from Stand Sit to Stand: Min guard           General transfer comment: reminders for safety and hand placement    Ambulation/Gait Ambulation/Gait assistance: Min guard Gait Distance (Feet): 30 Feet Assistive device: Rolling walker (2 wheels) Gait Pattern/deviations: Step-through pattern, Step-to pattern, Antalgic, Narrow base of support Gait velocity: reduced Gait velocity interpretation: <1.31 ft/sec, indicative of household ambulator Pre-gait activities: standing balance ck General Gait Details: L LE shorter, walking on toes and has unsteady management of walker   Stairs             Wheelchair Mobility    Modified Rankin (Stroke Patients Only)       Balance Overall balance assessment: Needs assistance, History of Falls Sitting-balance support: Feet supported Sitting balance-Leahy Scale: Good       Standing balance-Leahy Scale: Poor Standing balance comment: struggling in challenging situations such as at sink, requires cues for managing his balance off walker                            Cognition Arousal/Alertness: Awake/alert Behavior During Therapy: Flat  affect Overall Cognitive Status: History of cognitive impairments - at baseline                                 General Comments: trying to walk without help when PT arrived, did not notice his IV was not going to be able to be pulled along to walk        Exercises       General Comments General comments (skin integrity, edema, etc.): pt walked with help to BR and back, has trouble sequencing and solving problems, not sure of when to ask for help      Pertinent Vitals/Pain Pain Assessment Pain Assessment: Faces Faces Pain Scale: Hurts a little bit Pain Location: L hip Pain Descriptors / Indicators: Guarding Pain Intervention(s): Monitored during session, Repositioned    Home Living                          Prior Function            PT Goals (current goals can now be found in the care plan section) Progress towards PT goals: Progressing toward goals    Frequency    Min 3X/week      PT Plan Discharge plan needs to be updated    Co-evaluation              AM-PAC PT "6 Clicks" Mobility   Outcome Measure  Help needed turning from your back to your side while in a flat bed without using bedrails?: A Little Help needed moving from lying on your back to sitting on the side of a flat bed without using bedrails?: A Little Help needed moving to and from a bed to a chair (including a wheelchair)?: A Little Help needed standing up from a chair using your arms (e.g., wheelchair or bedside chair)?: A Little Help needed to walk in hospital room?: A Little Help needed climbing 3-5 steps with a railing? : A Lot 6 Click Score: 17    End of Session Equipment Utilized During Treatment: Gait belt Activity Tolerance: Patient tolerated treatment well;Other (comment) Patient left: with call bell/phone within reach;in bed;with bed alarm set Nurse Communication: Mobility status PT Visit Diagnosis: Difficulty in walking, not elsewhere classified (R26.2);Muscle weakness (generalized) (M62.81);Unsteadiness on feet (R26.81)     Time: [id number] PT Time Calculation (min) (ACUTE ONLY): 24 min  Charges:  $Gait Training: 8-22 mins $Therapeutic Activity: 8-22 mins       Ramond Dial 02/17/2022, 4:47 PM  Mee Hives, PT PhD Acute Rehab Dept.  Number: [place]

## 2022-02-17 NOTE — Progress Notes (Addendum)
[facility]                                                                              Gerald [name], is a 77 y.o. male, DOB - 11/30/1945, [id number] Admit date - 02/13/2022    Outpatient Primary MD for the patient is [name], DO  LOS - 0  days  Chief Complaint  Patient presents with   Agitation       Brief summary   Patient is a 77 year old male with dementia, CKD 3B with baseline creatinine 1.7-2.0, anemia, iron depression, chronic d CHF, HTN, HLP, DM, BPH presented to ED with altered mental status.  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 Over the course of last 1 week, patient has been having progressive confusion and weakness.  He was recently seen at [facility] long ED on 2/6 for urinary difficulty and Foley catheter was placed.  [facility] was seen by [facility] on [facility], catheter was replaced secondary to clogging of initial catheter by blood clots.  On admission, was significantly altered, aggressive and not recognizing his family. Vital signs stable, Sodium 145, bicarb 17, anion gap 12, creatinine 2.3, glucose 54, LFTs normal.  UA positive for UTI. CT abdomen pelvis showed no evidence of acute intra-abdominal process, no evidence of ureteral stones or hydroureteronephrosis. Patient was not added for further workup.   Assessment & Plan    Principal Problem:   Acute metabolic encephalopathy superimposed on advanced dementia -Likely precipitated due to UTI, dehydration, acute kidney injury - continue Seroquel 25 mg qhs, added 12.5 mg qam --CT head showed no acute intracranial abnormality, ?  Posterior midline occiput scalp hematoma, no underlying skull fracture. -Mental status improving  Active Problems: Urinary tract infection, in the setting of recent Foley catheter placement -Urine culture showed 50,000 colonies of  staph hemolyticus -Currently on IV Rocephin, will change to oral amoxicillin.  Avoiding Bactrim due to renal dysfunction -Spiking temps, 101.1 F, blood cultures negative so far, procalcitonin less than 0.1  Acute kidney injury 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) -Baseline hemoglobin 1.6-1.9, was 1.98 on 11/22/2021 -Presented with creatinine of 2.30, was 2.0 on 2/6 -CT abdomen pelvis showed no renal or ureteral stones, no hydronephrosis.  No acute findings -Creatinine stable at 1.4, at baseline    DM2 (diabetes mellitus, type 2) ([name]) with hypoglycemia, complications of CKD stage IIIb -Likely due to poor p.o. intake, -Continue dysphagia 2 diet, SLP evaluation on 2/10  -CBG stable, tolerating dysphagia 2 diet    Essential hypertension -BP somewhat elevated, continue Norvasc, Cardura -Added hydralazine IV as needed with parameters  Recent urinary retention 2/6 status post Foley catheter placement -Continue Foley catheter, continue finasteride, Cardura -Outpatient follow-up with urology    GERD (gastroesophageal reflux disease) -Continue PPI    Depression, advanced dementia, failure to thrive -Mental status likely worsened due to UTI, generalized debility  -Continue Seroquel 78m qhs, added 12.5 mg every morning due to agitation -PT evaluation recommended SNF, family transitioning towards memory care, will plan on  home health PT OT upon discharge    Chronic diastolic CHF (congestive heart failure) ([name]) -Euvolemic, no acute decompensation, follow volume status    Anemia of chronic disease -baseline of 8-9 -Hemoglobin 7.9, possibly due to hemodilution from IV fluids -Follow CBC in a.m., if less than 7.5, will transfuse 1 unit -Per daughter, patient was scheduled to receive monthly EPO injection today at the infusion center.  Will give it today while inpatient.  Hypokalemia -Resolved, replete as needed  Prolonged QT -Acute PC 511, avoid QT prolonging  medications -Follow Bmet, keep K ~4, Mg >2  Code Status: DNR DVT Prophylaxis:  SCDs Start: 02/14/22 0222   Level of Care: Level of care: Telemetry Medical Family Communication: Discussed with patient's daughter on the phone today. disposition Plan:      Remains inpatient appropriate: Possible discharge tomorrow if no fevers or acute issues Patient's daughter requested call from [facility] if patient can go to the memory care unit from the hospital.  Notified TOC.  Procedures:    Consultants:   None  Antimicrobials:   Anti-infectives (From admission, onward)    Start     Dose/Rate Route Frequency Ordered Stop   02/14/22 0800  cefTRIAXone (ROCEPHIN) 1 g in sodium chloride 0.9 % 100 mL IVPB        1 g 200 mL/hr over 30 Minutes Intravenous Every 24 hours 02/14/22 0245     02/14/22 0045  piperacillin-tazobactam (ZOSYN) IVPB 3.375 g        3.375 g 100 mL/hr over 30 Minutes Intravenous  Once 02/14/22 0038 02/14/22 0149          Medications  atorvastatin  10 mg Oral QHS   Chlorhexidine Gluconate Cloth  6 each Topical Daily   doxazosin  8 mg Oral QHS   ferrous sulfate  325 mg Oral Q breakfast   finasteride  5 mg Oral Daily   QUEtiapine  12.5 mg Oral q AM   QUEtiapine  25 mg Oral QHS   rivastigmine  3 mg Oral BID WC   senna-docusate  1 tablet Oral BID      Subjective:   Gerald [name] was seen and examined today.  Much more alert and oriented, appears to be at his baseline.  Tolerating diet, has been spiking fevers, 101.1 F today.  No nausea vomiting, chest pain or abdominal pain  Objective:   Vitals:   02/14/22 0347 02/14/22 0512 02/14/22 0740 02/14/22 1117  BP:  (!) 160/88 (!) 160/78 (!) 166/73  Pulse:  92 85 82  Resp:  18 16 18  $ Temp: 98.5 F (36.9 C)     TempSrc: Oral     SpO2:  100% 100% 100%    Intake/Output Summary (Last 24 hours) at 02/14/2022 1242 Last data filed at 02/14/2022 1117 Gross per 24 hour  Intake 1307.4 ml  Output 1100 ml  Net 207.4 ml      Wt Readings from Last 3 Encounters:  12/07/21 64.4 kg  12/05/21 65.8 kg  11/22/21 64.4 kg    Physical Exam General: Alert and oriented, NAD, pleasant Cardiovascular: S1 S2 clear, RRR.  Respiratory: CTAB, no wheezing Gastrointestinal: Soft, nontender, nondistended, NBS Ext: no pedal edema bilaterally Neuro: no new deficits Psych: Normal affect  GU Foley catheter+ clear urine     Data Reviewed:  I have personally reviewed following labs    CBC Lab Results  Component Value Date   WBC 6.2 02/13/2022   RBC 2.86 (L) 02/13/2022   HGB 8.6 (L)  02/13/2022   HCT 26.0 (L) 02/13/2022   MCV 90.9 02/13/2022   MCH 30.1 02/13/2022   PLT 117 (L) 02/13/2022   MCHC 33.1 02/13/2022   RDW 16.0 (H) 02/13/2022   LYMPHSABS 0.8 02/13/2022   MONOABS 0.5 02/13/2022   EOSABS 0.1 02/13/2022   BASOSABS 0.0 [id number]     Last metabolic panel Lab Results  Component Value Date   NA 145 02/13/2022   K 3.6 02/13/2022   CL 116 (H) 02/13/2022   CO2 17 (L) 02/13/2022   BUN 31 (H) 02/13/2022   CREATININE 2.30 (H) 02/13/2022   GLUCOSE 54 (L) 02/13/2022   GFRNONAA 29 (L) 02/13/2022   GFRAA >60 09/29/2014   CALCIUM 8.9 02/13/2022   PROT 6.3 (L) 02/13/2022   ALBUMIN 3.3 (L) 02/13/2022   BILITOT 1.2 02/13/2022   ALKPHOS 56 02/13/2022   AST 41 02/13/2022   ALT 18 02/13/2022   ANIONGAP 12 02/13/2022    CBG (last 3)  Recent Labs    02/14/22 0729 02/14/22 0810 02/14/22 1104  GLUCAP 44* 113* 78      Coagulation Profile: No results for input(s): "INR", "PROTIME" in the last 168 hours.   Radiology Studies: I have personally reviewed the imaging studies  CT HEAD WO CONTRAST (5MM)  Result Date: 02/14/2022 CLINICAL DATA:  77 year old male with delirium. EXAM: CT HEAD WITHOUT CONTRAST TECHNIQUE: Contiguous axial images were obtained from the base of the skull through the vertex without intravenous contrast. RADIATION DOSE REDUCTION: This exam was performed according to the  departmental dose-optimization program which includes automated exposure control, adjustment of the mA and/or kV according to patient size and/or use of iterative reconstruction technique. COMPARISON:  Head CT 09/13/2021, brain MRI 10/06/2020. FINDINGS: Brain: Motion artifact. Stable cerebral volume. No midline shift, mass effect, or evidence of intracranial mass lesion. No ventriculomegaly. When allowing for motion gray-white differentiation appears stable since last year. No convincing intracranial hemorrhage or cortically based infarct. Vascular: Calcified atherosclerosis at the skull base. No suspicious intracranial vascular hyperdensity. Skull: Motion artifact.  No acute osseous abnormality identified. Sinuses/Orbits: Visualized paranasal sinuses and mastoids are stable and well aerated. There is chronic maxillary sinus mucoperiosteal thickening, stable. Other: Motion artifact. Suspicion of midline occiput put scalp hematoma on series 4, image 34 up to 11 mm in thickness. Grossly stable and negative orbits. IMPRESSION: 1. Motion degraded exam. No acute intracranial abnormality identified. 2. Query posterior midline occiput scalp hematoma. No underlying skull fracture is evident. Electronically Signed   By: Genevie Ann M.D.   On: 02/14/2022 07:06   CT ABDOMEN PELVIS WO CONTRAST  Result Date: 02/14/2022 CLINICAL DATA:  Microscopic hematuria EXAM: CT ABDOMEN AND PELVIS WITHOUT CONTRAST TECHNIQUE: Multidetector CT imaging of the abdomen and pelvis was performed following the standard protocol without IV contrast. RADIATION DOSE REDUCTION: This exam was performed according to the departmental dose-optimization program which includes automated exposure control, adjustment of the mA and/or kV according to patient size and/or use of iterative reconstruction technique. COMPARISON:  03/06/2010.  Ultrasound 02/11/2022 FINDINGS: Lower chest: No acute abnormality. Coronary artery and aortic calcifications. Hepatobiliary: No  focal hepatic abnormality. Gallbladder unremarkable. Pancreas: No focal abnormality or ductal dilatation. Spleen: No focal abnormality.  Normal size. Adrenals/Urinary Tract: 2.2 cm left parapelvic cyst. No follow-up imaging recommended. No renal or ureteral stones. No hydronephrosis. Adrenal glands unremarkable. Foley catheter in the bladder which is mildly distended. Stomach/Bowel: Stomach, large and small bowel grossly unremarkable. Vascular/Lymphatic: Diffuse aortoiliac atherosclerosis. No evidence of aneurysm or adenopathy. Reproductive:  No visible focal abnormality. Other: No free fluid or free air. 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 Advanced degenerative changes in the lumbar spine. No acute bony abnormality. IMPRESSION: No renal or ureteral stones.  No hydronephrosis. Aortoiliac atherosclerosis, coronary artery disease. 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 No acute findings. Electronically Signed   By: Rolm Baptise M.D.   On: 02/14/2022 01:05       Montrez Marietta M.D. [facility] 02/14/2022, 12:42 PM  Available via Epic secure chat 7am-7pm After 7 pm, please refer to night coverage provider listed on amion.

## 2022-02-17 NOTE — Progress Notes (Signed)
Speech Language Pathology Treatment: Dysphagia  Patient Details Name: Gerald [name] MRN: [id number] DOB: [birth year] Today's Date: 02/17/2022 Time: [id number] SLP Time Calculation (min) (ACUTE ONLY): 14 min  Assessment / Plan / Recommendation Clinical Impression  Patient seen for f/u dysphagia treatment with focus on diet tolerance after initial evaluation. Patient alert and cooperative. Presented patient with apple sauce/dysphagia 1 solid. Prior to self feeding he removed a small amount of dysphagia 2 solids pocketed in right buccal cavity from am meal. He then consumed 4 ounces of pureed solid and thin liquids via straw without overt s/s of aspiration and full oral clearance. He reported that he consumed soft solids at home prior to hospitalization due to missing dentition. Encouraged buccal clearance with tongue or clean finger when self feeding meals. Despite a small amount of residue present, dysphagia 2 solids remain appropriate for this patient. No further SLP treatment indicated at this time.    HPI HPI: Pt is a 77 y.o. male who presented to ED with altered mental status, progressive confusion and weakness over last 1 week. At baseline pt has Alzheimer's dementia, oriented to self and place, lives at home with his wife although family has been attempting to transition to memory care facility. CT head (2/9) revealed "no acute intracranial abnormality, ?Posterior midline occiput scalp hematoma, no underlying skull fracture". Esophagram (12/16/21) s/p esophageal dilation revealed "No esophageal mucosal irregularity, stricture, or mass. Mild esophageal dysmotility. No reflux or hernia". 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      SLP Plan  All goals met      Recommendations for follow up therapy are one component of a multi-disciplinary discharge planning process, led  by the attending physician.  Recommendations may be updated based on patient status, additional functional criteria and insurance authorization.    Recommendations  Diet recommendations: Dysphagia 2 (fine chop);Thin liquid Liquids provided via: Cup;Straw Medication Administration: Whole meds with liquid Supervision: Patient able to self feed Compensations: Small sips/bites Postural Changes and/or Swallow Maneuvers: Seated upright 90 degrees                Oral Care Recommendations: Oral care BID Follow Up Recommendations: No SLP follow up SLP Visit Diagnosis: Dysphagia, unspecified (R13.10) Plan: All goals met         Gabriel Rainwater MA, CCC-SLP   Zariyah Stephens Meryl  02/17/2022, 12:33 PM

## 2022-02-17 NOTE — NC FL2 (Signed)
[place] LEVEL OF CARE FORM     IDENTIFICATION  Patient Name: Gerald [name] Birthdate: [birth year] Sex: male Admission Date (Current Location): 02/13/2022  [facility] and [place] Number:  Herbalist and Address:  [facility] [place] [address] [place], [place] [address]      Provider Number: [id number]  Attending Physician Name and Address:  Mendel Corning, MD  Relative Name and Phone Number:  Tamala Bari Daughter   [phone]    Current Level of Care: Hospital Recommended Level of Care: Memory Care Prior Approval Number:    Date Approved/Denied:   PASRR Number:    Discharge Plan: Other (Comment) (memory care)    Current Diagnoses: Patient Active Problem List   Diagnosis Date Noted   Acute cystitis 02/14/2022   Hypoglycemia 02/14/2022   Acute urinary retention 09/16/2021   Alzheimer's dementia with behavioral disturbance ([name]) 09/14/2021   Prolonged QT interval Q000111Q   Acute metabolic encephalopathy Q000111Q   AKI (acute kidney injury) ([place])    Acute renal failure superimposed on stage 3b chronic kidney disease ([place])    Acute encephalopathy 09/13/2021   Anemia of chronic renal failure 06/26/2021   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   Posterior vitreous detachment of both eyes 12/24/2020   Anemia of chronic disease 11/07/2020   Recurrent falls 11/07/2020   Pseudophakia of both eyes 10/16/2020   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 ([place]) 10/16/2020   Macular pucker, right eye 10/16/2020   Mild nonproliferative diabetic retinopathy of left eye ([place]) 10/16/2020   Anxiety state 01/04/2020   Benign prostatic hyperplasia without lower urinary tract symptoms 01/04/2020   Chronic diastolic CHF (congestive heart failure) ([place]) 01/04/2020   Diabetic renal disease ([place]) 01/04/2020   Diabetic retinopathy ([place]) 01/04/2020    ED (erectile dysfunction) of organic origin 01/04/2020   Hypertrophic cardiomyopathy ([place]) 01/04/2020   Insomnia 01/04/2020   Left bundle branch block 01/04/2020   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   Primary localized osteoarthritis of pelvic region and thigh 01/04/2020   Stage 3 chronic kidney disease ([place]) 01/04/2020   Vitamin B12 deficiency (non anemic) 01/04/2020   Recurrent major depression ([place]) 01/04/2020   Severe episode of recurrent major depressive disorder, without psychotic features ([place]) 10/05/2018   Cognitive complaints with normal neuropsychological exam 03/11/2018   Referred otalgia of left ear 09/23/2016   Temporomandibular joint (TMJ) pain 09/23/2016   Cervicogenic headache 09/03/2016   Obstructive sleep apnea syndrome 11/15/2015   Memory difficulties 10/05/2014   Abnormality of gait 10/05/2014   Intermittent explosive disorder 09/25/2014   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   Encounter for long-term (current) use of other medications 04/28/2013   Vitamin D Deficiency 04/28/2013   Sleep disorder -Epworth OSA scale = 15 06/02/2012   Anal fissure 08/09/2010   Septic arthritis of hip ([place]) 04/11/2010   HYPERLIPIDEMIA 01/11/2010   DEPRESSION 01/11/2010   Essential hypertension 01/11/2010   GERD (gastroesophageal reflux disease) 01/11/2010   Depression 01/11/2010   Hyperlipidemia 01/11/2010   DM2 (diabetes mellitus, type 2) ([place]) 09/21/2008   ANEMIA, IRON DEFICIENCY 09/21/2008   Personal history of colonic polyps 09/21/2008   Anemia, iron deficiency 09/21/2008    Orientation RESPIRATION BLADDER Height & Weight     Self, Place  Normal Continent, Indwelling catheter Weight: 150 lb 12.7 oz (68.4 kg) Height:     BEHAVIORAL SYMPTOMS/MOOD NEUROLOGICAL BOWEL  NUTRITION STATUS      Continent Diet (see discharge summary)  AMBULATORY STATUS COMMUNICATION OF NEEDS Skin   Limited Assist Verbally Skin abrasions                        Personal Care Assistance Level of Assistance  Bathing, Feeding, Dressing Bathing Assistance: Limited assistance Feeding assistance: Independent Dressing Assistance: Limited assistance     Functional Limitations Info  Sight, Hearing, Speech Sight Info: Adequate Hearing Info: Adequate Speech Info: Adequate    SPECIAL CARE FACTORS FREQUENCY  PT (By licensed PT), OT (By licensed OT)     PT Frequency: evaluate and treat OT Frequency: evaluate and treat            Contractures Contractures Info: Not present    Additional Factors Info  Code Status, Allergies Code Status Info: DNR Allergies Info: NKA           Current Medications (02/17/2022):  This is the current hospital active medication list Current Facility-Administered Medications  Medication Dose Route Frequency Provider Last Rate Last Admin   acetaminophen (TYLENOL) tablet 650 mg  650 mg Oral Q6H PRN Howerter, Justin B, DO   650 mg at 02/16/22 1800   Or   acetaminophen (TYLENOL) suppository 650 mg  650 mg Rectal Q6H PRN Howerter, Justin B, DO       amLODipine (NORVASC) tablet 10 mg  10 mg Oral q AM Rai, Ripudeep K, MD   10 mg at 02/17/22 [id number]   amoxicillin (AMOXIL) capsule 1,000 mg  1,000 mg Oral Q8H Rai, Ripudeep K, MD       atorvastatin (LIPITOR) tablet 10 mg  10 mg Oral QHS Howerter, Justin B, DO   10 mg at 02/16/22 2159   Chlorhexidine Gluconate Cloth 2 % PADS 6 each  6 each Topical Daily Rai, Ripudeep K, MD   6 each at 02/16/22 1145   dextrose 50 % solution 50 mL  1 ampule Intravenous Q1H PRN Howerter, Justin B, DO   50 mL at 02/14/22 0740   doxazosin (CARDURA) tablet 8 mg  8 mg Oral QHS Howerter, Justin B, DO   8 mg at 02/16/22 2159   epoetin alfa-epbx (RETACRIT) injection 10,000 Units  10,000 Units Subcutaneous Once Rai, Ripudeep K, MD       ferrous sulfate tablet 325 mg  325 mg Oral Q breakfast Howerter, Justin B, DO   325 mg at 02/17/22 0828   finasteride (PROSCAR) tablet 5 mg  5 mg Oral Daily  Howerter, Justin B, DO   5 mg at 02/17/22 [id number]   hydrALAZINE (APRESOLINE) injection 10 mg  10 mg Intravenous Q6H PRN Rai, Ripudeep K, MD       LORazepam (ATIVAN) injection 0.5 mg  0.5 mg Intravenous Q6H PRN Rai, Ripudeep K, MD   0.5 mg at 02/14/22 1815   melatonin tablet 3 mg  3 mg Oral QHS PRN Howerter, Justin B, DO       QUEtiapine (SEROQUEL) tablet 12.5 mg  12.5 mg Oral q AM Rai, Ripudeep K, MD   12.5 mg at 02/17/22 [id number]   QUEtiapine (SEROQUEL) tablet 25 mg  25 mg Oral QHS Howerter, Justin B, DO   25 mg at 02/16/22 2159   rivastigmine (EXELON) capsule 3 mg  3 mg Oral BID WC Howerter, Justin B, DO   3 mg at 02/17/22 [id number]   senna-docusate (Senokot-S) tablet 1 tablet  1 tablet Oral BID Howerter, Justin B, DO  1 tablet at 02/17/22 [id number]     Discharge Medications: Please see discharge summary for a list of discharge medications.  Relevant Imaging Results:  Relevant Lab Results:   Additional Information SSN: [id number]  Joanne Chars, LCSW

## 2022-02-17 NOTE — TOC Progression Note (Signed)
Transition of Care [facility]) - Progression Note    Patient Details  Name: IRVAN VAILLANCOURT MRN: [id number] Date of Birth: [birth year]  Transition of [facility]) CM/SW Contact  Joanne Chars, LCSW Phone Number: 02/17/2022, 3:57 PM  Clinical Narrative:   CSW spoke with pt daughter Joseph Art.  She has been in contact with Mendel Corning about their dementia unit and [facility] is reviewing the pt now, said she does have a bed.  She called the other ALF based memory cares and no beds were available.    CSW spoke with [facility] and she confirmed the above.  CSW confirmed with her that pt is recommended for HHPT, not SNF STR.  [facility] aware, can potentially take pt to their dementia unit as LTC pt under the medicaid.  She requested a regular SNF referral and she will get the medicaid number from the daughter.  Discussed pt possibly being ready for DC tomorrow and [facility] said they do have available bed.  Referral sent to [facility].      Expected Discharge Plan:  (TBD) Barriers to Discharge: Continued Medical Work up  Expected Discharge Plan and Services In-house Referral: Clinical Social Work   Post Acute Care Choice:  (TBD) Living arrangements for the past 2 months: Single Family Home                                       Social Determinants of Health (SDOH) Interventions SDOH Screenings   Food Insecurity: Unknown (09/14/2021)  Housing: Low Risk  (09/14/2021)  Transportation Needs: No Transportation Needs (09/14/2021)  Utilities: Not At Risk (09/14/2021)  Tobacco Use: Medium Risk (02/14/2022)    Readmission Risk Interventions    09/16/2021   10:18 AM  Readmission Risk Prevention Plan  Transportation Screening Complete  PCP or Specialist Appt within 5-7 Days Complete  Home Care Screening Complete  Medication Review (RN CM) Complete

## 2022-02-17 NOTE — Care Management Important Message (Signed)
Important Message  Patient Details  Name: Gerald [name] MRN: [id number] Date of Birth: 09-09-1945   Medicare Important Message Given:  Yes     Orbie Pyo 02/17/2022, 2:55 PM

## 2022-02-18 DIAGNOSIS — F32A Depression, unspecified: Secondary | ICD-10-CM | POA: Diagnosis not present

## 2022-02-18 DIAGNOSIS — Z743 Need for continuous supervision: Secondary | ICD-10-CM | POA: Diagnosis not present

## 2022-02-18 DIAGNOSIS — D649 Anemia, unspecified: Secondary | ICD-10-CM | POA: Diagnosis not present

## 2022-02-18 DIAGNOSIS — N390 Urinary tract infection, site not specified: Secondary | ICD-10-CM | POA: Diagnosis not present

## 2022-02-18 DIAGNOSIS — G9341 Metabolic encephalopathy: Secondary | ICD-10-CM | POA: Diagnosis not present

## 2022-02-18 DIAGNOSIS — N179 Acute kidney failure, unspecified: Secondary | ICD-10-CM | POA: Diagnosis not present

## 2022-02-18 DIAGNOSIS — R410 Disorientation, unspecified: Secondary | ICD-10-CM | POA: Diagnosis not present

## 2022-02-18 DIAGNOSIS — K2100 Gastro-esophageal reflux disease with esophagitis, without bleeding: Secondary | ICD-10-CM | POA: Diagnosis not present

## 2022-02-18 DIAGNOSIS — F02818 Dementia in other diseases classified elsewhere, unspecified severity, with other behavioral disturbance: Secondary | ICD-10-CM | POA: Diagnosis not present

## 2022-02-18 DIAGNOSIS — R3914 Feeling of incomplete bladder emptying: Secondary | ICD-10-CM | POA: Diagnosis not present

## 2022-02-18 DIAGNOSIS — E119 Type 2 diabetes mellitus without complications: Secondary | ICD-10-CM | POA: Diagnosis not present

## 2022-02-18 DIAGNOSIS — R4182 Altered mental status, unspecified: Secondary | ICD-10-CM | POA: Diagnosis not present

## 2022-02-18 DIAGNOSIS — R338 Other retention of urine: Secondary | ICD-10-CM | POA: Diagnosis not present

## 2022-02-18 DIAGNOSIS — G309 Alzheimer's disease, unspecified: Secondary | ICD-10-CM | POA: Diagnosis not present

## 2022-02-18 DIAGNOSIS — K219 Gastro-esophageal reflux disease without esophagitis: Secondary | ICD-10-CM | POA: Diagnosis not present

## 2022-02-18 DIAGNOSIS — N319 Neuromuscular dysfunction of bladder, unspecified: Secondary | ICD-10-CM | POA: Diagnosis not present

## 2022-02-18 DIAGNOSIS — Z79899 Other long term (current) drug therapy: Secondary | ICD-10-CM | POA: Diagnosis not present

## 2022-02-18 DIAGNOSIS — I5032 Chronic diastolic (congestive) heart failure: Secondary | ICD-10-CM | POA: Diagnosis not present

## 2022-02-18 DIAGNOSIS — E0869 Diabetes mellitus due to underlying condition with other specified complication: Secondary | ICD-10-CM | POA: Diagnosis not present

## 2022-02-18 DIAGNOSIS — E118 Type 2 diabetes mellitus with unspecified complications: Secondary | ICD-10-CM | POA: Diagnosis not present

## 2022-02-18 DIAGNOSIS — G9340 Encephalopathy, unspecified: Secondary | ICD-10-CM | POA: Diagnosis not present

## 2022-02-18 DIAGNOSIS — I130 Hypertensive heart and chronic kidney disease with heart failure and stage 1 through stage 4 chronic kidney disease, or unspecified chronic kidney disease: Secondary | ICD-10-CM | POA: Diagnosis not present

## 2022-02-18 DIAGNOSIS — N1832 Chronic kidney disease, stage 3b: Secondary | ICD-10-CM | POA: Diagnosis not present

## 2022-02-18 DIAGNOSIS — I10 Essential (primary) hypertension: Secondary | ICD-10-CM | POA: Diagnosis not present

## 2022-02-18 DIAGNOSIS — E785 Hyperlipidemia, unspecified: Secondary | ICD-10-CM | POA: Diagnosis not present

## 2022-02-18 DIAGNOSIS — I5031 Acute diastolic (congestive) heart failure: Secondary | ICD-10-CM | POA: Diagnosis not present

## 2022-02-18 DIAGNOSIS — N1831 Chronic kidney disease, stage 3a: Secondary | ICD-10-CM | POA: Diagnosis not present

## 2022-02-18 DIAGNOSIS — D638 Anemia in other chronic diseases classified elsewhere: Secondary | ICD-10-CM | POA: Diagnosis not present

## 2022-02-18 DIAGNOSIS — R1310 Dysphagia, unspecified: Secondary | ICD-10-CM | POA: Diagnosis not present

## 2022-02-18 DIAGNOSIS — I110 Hypertensive heart disease with heart failure: Secondary | ICD-10-CM | POA: Diagnosis not present

## 2022-02-18 DIAGNOSIS — N3001 Acute cystitis with hematuria: Secondary | ICD-10-CM | POA: Diagnosis not present

## 2022-02-18 LAB — BASIC METABOLIC PANEL
Anion gap: 7 (ref 5–15)
BUN: 13 mg/dL (ref 8–23)
CO2: 21 mmol/L — ABNORMAL LOW (ref 22–32)
Calcium: 8.5 mg/dL — ABNORMAL LOW (ref 8.9–10.3)
Chloride: 111 mmol/L (ref 98–111)
Creatinine, Ser: 1.47 mg/dL — ABNORMAL HIGH (ref 0.61–1.24)
GFR, Estimated: 49 mL/min — ABNORMAL LOW (ref >60)
Glucose, Bld: 97 mg/dL (ref 70–99)
Potassium: 4.1 mmol/L (ref 3.5–5.1)
Sodium: 139 mmol/L (ref 135–145)

## 2022-02-18 LAB — CBC
HCT: 26.4 % — ABNORMAL LOW (ref 39.0–52.0)
Hemoglobin: 8.5 g/dL — ABNORMAL LOW (ref 13.0–17.0)
MCH: 28.9 pg (ref 26.0–34.0)
MCHC: 32.2 g/dL (ref 30.0–36.0)
MCV: 89.8 fL (ref 80.0–100.0)
Platelets: 89 10*3/uL — ABNORMAL LOW (ref 150–400)
RBC: 2.94 MIL/uL — ABNORMAL LOW (ref 4.22–5.81)
RDW: 15.2 % (ref 11.5–15.5)
WBC: 4.8 10*3/uL (ref 4.0–10.5)
nRBC: 0 % (ref 0.0–0.2)

## 2022-02-18 LAB — GLUCOSE, CAPILLARY
Glucose-Capillary: 88 mg/dL (ref 70–99)
Glucose-Capillary: 98 mg/dL (ref 70–99)

## 2022-02-18 MED ORDER — QUETIAPINE FUMARATE 25 MG PO TABS: 12.5000 mg | ORAL_TABLET | Freq: Every morning | ORAL | 3 refills | Status: AC

## 2022-02-18 MED ORDER — MEMANTINE HCL 10 MG PO TABS
10.0000 mg | ORAL_TABLET | Freq: Two times a day (BID) | ORAL | Status: DC
  Administered 2022-02-18: 10 mg via ORAL
  Filled 2022-02-18: qty 1

## 2022-02-18 MED ORDER — AMOXICILLIN 500 MG PO CAPS: 1000.0000 mg | ORAL_CAPSULE | Freq: Three times a day (TID) | ORAL | Status: AC

## 2022-02-18 NOTE — Discharge Summary (Signed)
Physician Discharge Summary   Patient: Gerald [name] MRN: [id number] DOB: 1945/02/15  Admit date:     02/13/2022  Discharge date: 02/18/22  Discharge Physician: [name], MD    PCP: Gerald Morning, DO   Recommendations at discharge:   Continue amoxicillin 1 g 3 times daily for 6 days Continue Foley catheter, outpatient follow-up with [facility] in the next 7 to 10 days  Discharge Diagnoses:    Acute metabolic encephalopathy improved Urinary tract infection, in the setting of recent Foley catheter placement   Acute renal failure superimposed on stage 3b chronic kidney disease ([name])   DM2 (diabetes mellitus, type 2) ([name])   Essential hypertension   GERD (gastroesophageal reflux disease)   Depression   Chronic diastolic CHF (congestive heart failure) ([name])   Anemia of chronic disease   Hypoglycemia Recently Foley catheter placement for urinary retention    Hospital Course: Patient is a 77 year old male with dementia, CKD 3B with baseline creatinine 1.7-2.0, anemia, iron depression, chronic d CHF, HTN, HLP, DM, BPH presented to ED with altered mental status.  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 Over the course of last 1 week, patient has been having progressive confusion and weakness.  He was recently seen at [facility] long ED on 2/6 for urinary difficulty and Foley catheter was placed.  [facility] was [facility] by [facility] on 2/[facility], catheter was replaced secondary to clogging of initial catheter by blood clots.  On admission, was significantly altered, aggressive and not recognizing his family. Vital signs stable, Sodium 145, bicarb 17, anion gap 12, creatinine 2.3, glucose 54, LFTs normal.  UA positive for UTI. CT abdomen pelvis showed no evidence of acute intra-abdominal process, no evidence of ureteral stones or  hydroureteronephrosis.    Assessment and Plan:    Acute metabolic encephalopathy superimposed on advanced dementia -Likely precipitated due to UTI, dehydration, acute kidney injury - continue Seroquel 25 mg qhs, added 12.5 mg qAM --CT head showed no acute intracranial abnormality, ? Posterior midline occiput scalp hematoma, no underlying skull fracture. -Mental status improving, appears close to his baseline    Urinary tract infection, in the setting of recent Foley catheter placement -Urine culture showed 50,000 colonies of staph hemolyticus -Initially placed on IV Rocephin then transitioned to oral amoxicillin based on the sensitivities.  -Afebrile blood cultures negative so far, procalcitonin less than 0.1 -Continue oral amoxicillin to complete 7-day course. -[facility]patient follow-up with [facility] recommended in 7 to 10 days.   Acute kidney injury 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) -Baseline hemoglobin 1.6-1.9, was 1.98 on 11/22/2021 -Presented with creatinine of 2.30, was 2.0 on 2/6 -CT abdomen pelvis showed no renal or ureteral stones, no hydronephrosis.  No acute findings -Creatinine stable at 1.4, at baseline     DM2 (diabetes mellitus, type 2) ([name]) with hypoglycemia, complications of CKD stage IIIb -Likely due to poor p.o. intake, -Continue dysphagia 2 diet, SLP evaluation on 2/10      Essential hypertension - continue Norvasc, Cardura   Recent urinary retention 2/6 status post Foley catheter placement -Continue Foley catheter, continue finasteride, Cardura -Outpatient follow-up with urology     GERD (gastroesophageal reflux disease) -Continue PPI     Depression, advanced dementia, failure to thrive -Mental status likely worsened due to UTI, generalized debility  -Continue Seroquel 64m qhs, added 12.5 mg every [name] due to agitation -  PT evaluation recommended SNF, family transitioning towards memory care, will plan on home health PT OT  upon discharge     Chronic diastolic CHF (congestive heart failure) ([name]) -Euvolemic, no acute decompensation, follow volume status     Anemia of chronic disease -baseline of 8-9 -Hemoglobin 8.5, status post SQ monthly EPO injection x 1 on 02/17/2022 (patient receives monthly EPO at the infusion center) .     Hypokalemia -Resolved, replete as needed   Prolonged QT -QTc  511, avoid QT prolonging medications -Follow Bmet, keep K ~4, Mg >2     Pain control - [place] Controlled Substance Reporting System database was reviewed. and patient was instructed, not to drive, operate heavy machinery, perform activities at heights, swimming or participation in water activities or provide baby-sitting services while on Pain, Sleep and Anxiety Medications; until their outpatient Physician has advised to do so again. Also recommended to not to take more than prescribed Pain, Sleep and Anxiety Medications.  Consultants:  Procedures performed: None Disposition: Skilled nursing facility Diet recommendation: Dysphagia 2 diet with thin liquids  DISCHARGE MEDICATION: Allergies as of 02/18/2022   No Known Allergies      Medication List     STOP taking these medications    buPROPion 150 MG 24 hr tablet Commonly known as: WELLBUTRIN XL   ciprofloxacin 250 MG tablet Commonly known as: CIPRO   FLUoxetine 40 MG capsule Commonly known as: PROZAC       TAKE these medications    amLODipine 10 MG tablet Commonly known as: NORVASC Take 10 mg by mouth in the [name].   amoxicillin 500 MG capsule Commonly known as: AMOXIL Take 2 capsules (1,000 mg total) by mouth 3 (three) times daily for 6 days.   atorvastatin 10 MG tablet Commonly known as: LIPITOR Take 10 mg by mouth at bedtime.   Centrum Silver 50+Men Tabs Take 1 tablet by mouth daily with breakfast.   doxazosin 8 MG tablet Commonly known as: CARDURA Take 1 tablet (8 mg total) by mouth at bedtime.   ferrous sulfate 325 (65  FE) MG tablet Take 325 mg by mouth daily with breakfast.   finasteride 5 MG tablet Commonly known as: PROSCAR Take 5 mg by mouth daily.   Melatonin 5 MG Subl Place 5 mg under the tongue at bedtime as needed (sleep).   memantine 10 MG tablet Commonly known as: Namenda Take 1 tablet (10 mg total) by mouth 2 (two) times daily.   omeprazole 40 MG capsule Commonly known as: PRILOSEC Take 1 capsule (40 mg total) by mouth daily.   polyethylene glycol 17 g packet Commonly known as: MIRALAX / GLYCOLAX Take 17 g by mouth daily as needed for moderate constipation. What changed:  when to take this additional instructions   QUEtiapine 25 MG tablet Commonly known as: SEROquel Take 1 tablet (25 mg total) by mouth at bedtime. What changed: Another medication with the same name was added. Make sure you understand how and when to take each.   QUEtiapine 25 MG tablet Commonly known as: SEROQUEL Take 0.5 tablets (12.5 mg total) by mouth in the [name]. Start taking on: February 19, 2022 What changed: You were already taking a medication with the same name, and this prescription was added. Make sure you understand how and when to take each.   rivastigmine 3 MG capsule Commonly known as: EXELON Take 1 capsule (3 mg total) by mouth 2 (two) times daily.   senna-docusate 8.6-50 MG tablet Commonly known as:  Senokot-S Take 1 tablet by mouth 2 (two) times daily. What changed: when to take this        [place], DO. Schedule an appointment as soon as possible for a visit in 2 week(s).   Specialty: Family Medicine Why: for hospital follow-up Contact information: [address] [place] [address] [place]. Schedule an appointment as soon as possible for a visit in 2 week(s).   Why: for hospital follow-up Contact information: [place] [phone]                Discharge Exam: Filed Weights   02/16/22 0500 02/17/22 0500 02/18/22 0500  Weight: 66.4 kg 68.4 kg 68.1 kg   S: No acute complaints, no nausea vomiting, abdominal pain or diarrhea.  Looking forward to be discharged.  Tolerating diet.  No fevers.  Condition at discharge: fair  The results of significant diagnostics from this hospitalization (including imaging, microbiology, ancillary and laboratory) are listed below for reference.   Imaging Studies: CT HEAD WO CONTRAST (5MM)  Result Date: 02/14/2022 CLINICAL DATA:  77 year old male with delirium. EXAM: CT HEAD WITHOUT CONTRAST TECHNIQUE: Contiguous axial images were obtained from the base of the skull through the vertex without intravenous contrast. RADIATION DOSE REDUCTION: This exam was performed according to the departmental dose-optimization program which includes automated exposure control, adjustment of the mA and/or kV according to patient size and/or use of iterative reconstruction technique. COMPARISON:  Head CT 09/13/2021, brain MRI 10/06/2020. FINDINGS: Brain: Motion artifact. Stable cerebral volume. No midline shift, mass effect, or evidence of intracranial mass lesion. No ventriculomegaly. When allowing for motion gray-white differentiation appears stable since last year. No convincing intracranial hemorrhage or cortically based infarct. Vascular: Calcified atherosclerosis at the skull base. No suspicious intracranial vascular hyperdensity. Skull: Motion artifact.  No acute osseous abnormality identified. Sinuses/Orbits: Visualized paranasal sinuses and mastoids are stable and well aerated. There is chronic maxillary sinus mucoperiosteal thickening, stable. Other: Motion artifact. Suspicion of midline occiput put scalp hematoma on series 4, image 34 up to 11 mm in thickness. Grossly stable and negative orbits. IMPRESSION: 1. Motion degraded exam. No acute intracranial abnormality identified. 2. Query posterior midline  occiput scalp hematoma. No underlying skull fracture is evident. Electronically Signed   By: Genevie Ann M.D.   On: 02/14/2022 07:06   CT ABDOMEN PELVIS WO CONTRAST  Result Date: 02/14/2022 CLINICAL DATA:  Microscopic hematuria EXAM: CT ABDOMEN AND PELVIS WITHOUT CONTRAST TECHNIQUE: Multidetector CT imaging of the abdomen and pelvis was performed following the standard protocol without IV contrast. RADIATION DOSE REDUCTION: This exam was performed according to the departmental dose-optimization program which includes automated exposure control, adjustment of the mA and/or kV according to patient size and/or use of iterative reconstruction technique. COMPARISON:  03/06/2010.  Ultrasound 02/11/2022 FINDINGS: Lower chest: No acute abnormality. Coronary artery and aortic calcifications. Hepatobiliary: No focal hepatic abnormality. Gallbladder unremarkable. Pancreas: No focal abnormality or ductal dilatation. Spleen: No focal abnormality.  Normal size. Adrenals/Urinary Tract: 2.2 cm left parapelvic cyst. No follow-up imaging recommended. No renal or ureteral stones. No hydronephrosis. Adrenal glands unremarkable. Foley catheter in the bladder which is mildly distended. Stomach/Bowel: Stomach, large and small bowel grossly unremarkable. Vascular/Lymphatic: Diffuse aortoiliac atherosclerosis. No evidence of aneurysm or adenopathy. Reproductive: No visible focal abnormality. Other: No free fluid or free air. Musculoskeletal: Prior right hip replacement. 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 Advanced degenerative changes in the lumbar spine. No acute bony abnormality. IMPRESSION: No renal or ureteral stones.  No hydronephrosis. Aortoiliac atherosclerosis, coronary artery disease. 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 No acute findings. Electronically Signed   By: Rolm Baptise M.D.   On: 02/14/2022 01:05   US RENAL  Result Date: 02/11/2022 CLINICAL DATA:  Urinary obstruction EXAM:  RENAL / URINARY TRACT ULTRASOUND COMPLETE COMPARISON:  Ultrasound 09/14/2021 FINDINGS: Right Kidney: Renal measurements: None 0.2 54.8 x 6.7 cm = volume: 153.3 mL. Echogenicity within normal limits. No hydronephrosis. Small cysts measuring up to 5 mm, no imaging follow-up is recommended Left Kidney: Renal measurements: 9.4 x 4.9 x 6.3 cm = volume: 151.7 mL. Echogenicity within normal limits. No hydronephrosis. Cyst at the midpole measuring 3 cm, no imaging follow-up is recommended. Bladder: Appears normal for degree of bladder distention. Other: None. IMPRESSION: Negative for hydronephrosis. Electronically Signed   By: Donavan Foil M.D.   On: 02/11/2022 21:13    Microbiology: Results for orders placed or performed during the hospital encounter of 02/13/22  Urine Culture (for pregnant, neutropenic or urologic patients or patients with an indwelling urinary catheter)     Status: Abnormal   Collection Time: 02/13/22 11:42 PM   Specimen: Urine, Catheterized  Result Value Ref Range Status   Specimen Description URINE, CATHETERIZED  Final   Special Requests NONE  Final   Culture (A)  Final    50,000 COLONIES/mL STAPHYLOCOCCUS HAEMOLYTICUS Two isolates with different morphologies were identified as the same organism.The most resistant organism was reported. Performed at [facility], [place] [address], [place], [place] [address]    Report Status 02/16/2022 FINAL  Final   Organism ID, Bacteria STAPHYLOCOCCUS HAEMOLYTICUS (A)  Final      Susceptibility   Staphylococcus haemolyticus - MIC*    CIPROFLOXACIN >=8 RESISTANT Resistant     GENTAMICIN <=0.5 SENSITIVE Sensitive     NITROFURANTOIN <=16 SENSITIVE Sensitive     OXACILLIN <=0.25 SENSITIVE Sensitive     TETRACYCLINE <=1 SENSITIVE Sensitive     VANCOMYCIN 1 SENSITIVE Sensitive     TRIMETH/SULFA <=10 SENSITIVE Sensitive     CLINDAMYCIN <=0.25 SENSITIVE Sensitive     RIFAMPIN <=0.5 SENSITIVE Sensitive     Inducible Clindamycin NEGATIVE  Sensitive     * 50,000 COLONIES/mL STAPHYLOCOCCUS HAEMOLYTICUS  Culture, blood (Routine X 2) w Reflex to ID Panel     Status: None (Preliminary result)   Collection Time: 02/16/22  6:36 PM   Specimen: BLOOD RIGHT HAND  Result Value Ref Range Status   Specimen Description BLOOD RIGHT HAND  Final   Special Requests   Final    BOTTLES DRAWN AEROBIC AND ANAEROBIC Blood Culture adequate volume   Culture   Final    NO GROWTH 2 DAYS Performed at [facility], [address] [place] [address] [place],  [address]    Report Status PENDING  Incomplete  Culture, blood (Routine X 2) w Reflex to ID Panel     Status: None (Preliminary result)   Collection Time: 02/16/22  6:39 PM   Specimen: BLOOD LEFT HAND  Result Value Ref Range Status   Specimen Description BLOOD LEFT HAND  Final   Special Requests   Final    BOTTLES DRAWN AEROBIC ONLY Blood Culture results may not be optimal due to an inadequate volume of blood received in culture bottles   Culture   Final  NO GROWTH 2 DAYS Performed at [facility], [place] [address] [place], [place] [address]    Report Status PENDING  Incomplete    Labs: CBC: Recent Labs  Lab 02/11/22 2058 02/13/22 2328 02/15/22 0406 02/16/22 0350 02/17/22 0320 02/18/22 0626  WBC 5.1 6.2 3.8* 3.7* 4.1 4.8  NEUTROABS 3.5 4.7  --   --   --   --   HGB 9.2* 8.6* 7.7* 8.1* 7.9* 8.5*  HCT 27.4* 26.0* 23.1* 24.1* 24.3* 26.4*  MCV 88.4 90.9 90.6 89.6 91.7 89.8  PLT 135* 117* 100* 98* 91* 89*   Basic Metabolic Panel: Recent Labs  Lab 02/14/22 1315 02/15/22 0403 02/15/22 0406 02/16/22 0350 02/17/22 0320 02/18/22 0626  NA 147*  --  142 138 136 139  K 3.5  --  2.9* 4.2 3.9 4.1  CL 114*  --  114* 111 109 111  CO2 21*  --  20* 20* 20* 21*  GLUCOSE 76  --  83 87 96 97  BUN 21  --  13 12 10 13  $ CREATININE 1.82*  --  1.51* 1.47* 1.40* 1.47*  CALCIUM 8.8*  --  8.2* 8.1* 8.0* 8.5*  MG  --  1.7  --   --   --   --    Liver Function Tests: Recent Labs  Lab  02/13/22 2328  AST 41  ALT 18  ALKPHOS 56  BILITOT 1.2  PROT 6.3*  ALBUMIN 3.3*   CBG: Recent Labs  Lab 02/17/22 1117 02/17/22 1611 02/17/22 2129 02/18/22 0805 02/18/22 1121  GLUCAP 90 88 102* 88 98    Discharge time spent: greater than 30 minutes.  Signed: [name], MD [facility] 02/18/2022

## 2022-02-18 NOTE — TOC Transition Note (Signed)
Transition of Care [facility]) - CM/SW Discharge Note   Patient Details  Name: Gerald [name] MRN: [id number] Date of Birth: 11-23-1945  [facility]) CM/SW Contact:  Joanne Chars, LCSW Phone Number: 02/18/2022, 2:40 PM   Clinical Narrative:   Pt discharging to [facility].  RN call report to [phone].     Final next level of care: Skilled Nursing Facility Barriers to Discharge: Barriers Resolved   Patient Goals and CMS Choice      Discharge Placement                Patient chooses bed at:  [facility]) Patient to be transferred to facility by: [place] Name of family member notified: daughter Joseph Art Patient and family notified of of transfer: 02/18/22  Discharge Plan and Services Additional resources added to the After Visit Summary for   In-house Referral: Clinical Social Work   Post Acute Care Choice:  (TBD)                               Social Determinants of Health (SDOH) Interventions SDOH Screenings   Food Insecurity: Unknown (09/14/2021)  Housing: Low Risk  (09/14/2021)  Transportation Needs: No Transportation Needs (09/14/2021)  Utilities: Not At Risk (09/14/2021)  Tobacco Use: Medium Risk (02/14/2022)     Readmission Risk Interventions    09/16/2021   10:18 AM  Readmission Risk Prevention Plan  Transportation Screening Complete  PCP or Specialist Appt within 5-7 Days Complete  Home Care Screening Complete  Medication Review (RN CM) Complete

## 2022-02-18 NOTE — Progress Notes (Addendum)
Contacted nurse again, was able to speak to nurse to give report.  Contacted nurse at [facility] facility to give report to nurse. No response, will call again.

## 2022-02-18 NOTE — TOC Progression Note (Addendum)
Transition of Care [facility]) - Progression Note    Patient Details  Name: Gerald [name] MRN: [id number] Date of Birth: May 15, 1945  [facility]) CM/SW Contact  Joanne Chars, LCSW Phone Number: 02/18/2022, 10:57 AM  Clinical Narrative:    CSW message to [facility], requesting update on bed offer.  Per Nevin Bloodgood, should have decision soon.  CSW notes change in PT recommendation from [facility] to SNF.    1045: Message from [facility], they can make bed offer.  With SNF recommendation, they do need SNF auth.  Auth request submitted in [place].   1300: auth [id number], [id number], 3 days: 2/13-2/15.  [facility] informed.  Not ready to receive pt yet, will call CSW back.   1420: [facility] ready to receive pt.  CSW confirmed with Rod Holler PT, who reports pt does need ambulance transport.    Expected Discharge Plan:  (TBD) Barriers to Discharge: Continued Medical Work up  Expected Discharge Plan and Services In-house Referral: Clinical Social Work   Post Acute Care Choice:  (TBD) Living arrangements for the past 2 months: Single Family Home                                       Social Determinants of Health (SDOH) Interventions SDOH Screenings   Food Insecurity: Unknown (09/14/2021)  Housing: Low Risk  (09/14/2021)  Transportation Needs: No Transportation Needs (09/14/2021)  Utilities: Not At Risk (09/14/2021)  Tobacco Use: Medium Risk (02/14/2022)    Readmission Risk Interventions    09/16/2021   10:18 AM  Readmission Risk Prevention Plan  Transportation Screening Complete  PCP or Specialist Appt within 5-7 Days Complete  Home Care Screening Complete  Medication Review (RN CM) Complete

## 2022-02-18 NOTE — Progress Notes (Signed)
Mobility Specialist Progress Note    02/18/22 1032  Mobility  Activity Ambulated with assistance in hallway  Level of Assistance Minimal assist, patient does 75% or more  Assistive Device Front wheel walker  Distance Ambulated (ft) 60 ft  Activity Response Tolerated well  Mobility Referral Yes  $Mobility charge 1 Mobility   Pt received in bed and agreeable. No complaints on walk. Returned to sitting EOB with call bell in reach and bed alarm on. RN aware.   Hildred Alamin Mobility Specialist  Please Psychologist, sport and exercise or Rehab Office at [phone]

## 2022-02-19 DIAGNOSIS — G9341 Metabolic encephalopathy: Secondary | ICD-10-CM | POA: Diagnosis not present

## 2022-02-20 DIAGNOSIS — N179 Acute kidney failure, unspecified: Secondary | ICD-10-CM | POA: Diagnosis not present

## 2022-02-21 ENCOUNTER — Telehealth: Payer: Self-pay

## 2022-02-21 LAB — CULTURE, BLOOD (ROUTINE X 2)
Culture: NO GROWTH
Culture: NO GROWTH
Special Requests: ADEQUATE

## 2022-02-21 NOTE — Telephone Encounter (Signed)
     Patient  visit on 2/7  at [place] you been able to follow up with your primary care physician? Yes   The patient was or was not able to obtain any needed medicine or equipment. Yes   Are there diet recommendations that you are having difficulty following? Na   Patient expresses understanding of discharge instructions and education provided has no other needs at this time. Yes     [place] [id number] [address], [place], [place] [address] Phone: [phone] Email: [email or web address]

## 2022-02-24 DIAGNOSIS — R338 Other retention of urine: Secondary | ICD-10-CM | POA: Diagnosis not present

## 2022-02-24 DIAGNOSIS — R3914 Feeling of incomplete bladder emptying: Secondary | ICD-10-CM | POA: Diagnosis not present

## 2022-02-24 DIAGNOSIS — N319 Neuromuscular dysfunction of bladder, unspecified: Secondary | ICD-10-CM | POA: Diagnosis not present

## 2022-02-25 ENCOUNTER — Ambulatory Visit (INDEPENDENT_AMBULATORY_CARE_PROVIDER_SITE_OTHER): Payer: Medicare Other | Admitting: Gastroenterology

## 2022-02-25 ENCOUNTER — Encounter: Payer: Self-pay | Admitting: Gastroenterology

## 2022-02-25 VITALS — BP 128/58 | HR 78 | Ht 66.0 in | Wt 154.2 lb

## 2022-02-25 DIAGNOSIS — K219 Gastro-esophageal reflux disease without esophagitis: Secondary | ICD-10-CM

## 2022-02-25 DIAGNOSIS — Z79899 Other long term (current) drug therapy: Secondary | ICD-10-CM

## 2022-02-25 DIAGNOSIS — R1310 Dysphagia, unspecified: Secondary | ICD-10-CM

## 2022-02-25 NOTE — Patient Instructions (Addendum)
If your blood pressure at your visit was 140/90 or greater, please contact your primary care physician to follow up on this.  _______________________________________________________  If you are age 77 or older, your body mass index should be between 23-30. Your Body mass index is 24.9 kg/m. If this is out of the aforementioned range listed, please consider follow up with your Primary Care Provider.  If you are age 56 or younger, your body mass index should be between 19-25. Your Body mass index is 24.9 kg/m. If this is out of the aformentioned range listed, please consider follow up with your Primary Care Provider.   ________________________________________________________  The [facility] providers would like to encourage you to use [facility] to communicate with providers for non-urgent requests or questions.  Due to long hold times on the telephone, sending your provider a message by [facility] may be a faster and more efficient way to get a response.  Please allow 48 business hours for a response.  Please remember that this is for non-urgent requests.  _______________________________________________________   Continue omeprazole.  Please follow up in 1 year.  Thank you for entrusting me with your care and for choosing [facility], Dr. Luther Cellar

## 2022-02-25 NOTE — Progress Notes (Signed)
HPI :  77 year old male here for a follow-up visit for dysphagia.  Recall he has multiple medical problems as outlined.  History of chronic multifactorial anemia, hypertension, hyperlipidemia, CHF, LBBB, DM II, CKD, GERD, distal esophageal stricture s/p dilatation in 2000 and 2010 and colon polyps.  He saw Carl Best in November of last year with recurrent dysphagia.  He wears dentures and sometimes does not chew food as well as he should however was having a very difficult time eating, with occasional dysphagia.  He states he lost several pounds over period of time due to his poor eating.  He had an endoscopy with me in early December.  He had a small hiatal hernia with a rather tortuous distal esophagus and GEJ stenosis that was dilated to 15 mm.  He otherwise had some blue staining of his esophagus consistent with stasis and some areas of tracheal cessation, biopsies were taken.  There are some focal changes with elevated eosinophil count to 15 per high-power field but not diffuse.  Changes were thought to be marked so consistent with reflux.  Given the overt appearance of his esophagus, I was also concerned about possible dysmotility.  He had a follow-up barium swallow with tablet shortly after his EGD and this confirmed some mild dysmotility.  No obvious stenosis noted on that exam.  He states following the dilation he has done really well and his dysphagia essentially has resolved.  He is eating much better, he states he has regained some weight.  Looking at his chart he has regained 12 pounds since have seen him in November.  Overall doing really well.  We had continued him on omeprazole, he takes this for reflux, 40 mg once daily.  He is pretty happy with the regimen and states this works well for him to control his symptoms.    He was hospitalized for UTI in February.  He does have CKD, stable.  He also has a chronic normocytic anemia.  Iron studies back in November were consistent  with that of chronic disease.  He had some mild gastritis on endoscopy, biopsies negative for H. pylori.    ECHO 05/04/2014: LV EF 55 - 60%   PAST GI PROCEDURES: CT scan abdomen pelvis 02/14/22: IMPRESSION: No renal or ureteral stones.  No hydronephrosis. Aortoiliac atherosclerosis, coronary artery disease. 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 No acute findings.   Barium swallow with tablet 12/16/2021: IMPRESSION: 1.  No esophageal mucosal irregularity, stricture, or mass. 2.  Mild esophageal dysmotility. 3.  No reflux or hernia.   EGD 12/07/2021: Esophagogastric landmarks were identified: the                            Z-line was found at 39 cm, the gastroesophageal                            junction was found at 39 cm and the upper extent of                            the gastric folds was found at 40 cm from the                            incisors.  A 1 cm hiatal hernia was present.                           One benign-appearing, intrinsic stenosis was found                            39 cm from the incisors. Another very subtle and                            widely patent stricture noted a few cm's                            proximally. A TTS dilator was passed through the                            scope. Dilation with a 12-13.5-15 mm balloon                            dilator was performed to 12 mm, 13.5 mm and then 15                            mm after which appropriate mucosal wrents were                            noted. This was biopsied with a cold forceps for                            opening the stricture further.                           The distal esophagus was quite tortuous with                            trachealization. Also, mucosa stained blue likely                            from recent liquid ingestion and stagnant motility.                            Biopsies were taken with a cold forceps for                             histology.                           The exam of the esophagus was otherwise normal. No                            high grade strictures.                           Patchy mildly erythematous mucosa was found in the  gastric fundus and in the gastric body.                           The exam of the stomach was otherwise normal.                           Biopsies were taken with a cold forceps in the                            gastric body, at the incisura and in the gastric                            antrum for Helicobacter pylori testing.                           The examined duodenum was normal.  1. Surgical [P], gastric antrum and gastric body - REACTIVE GASTROPATHY, ANTRAL. - GASTRIC OXYNTIC MUCOSA WITH MILD GLANDULAR DILATATION, SUGGESTIVE OF PROTON PUMP INHIBITOR EFFECT. - NEGATIVE FOR AN INFLAMMATORY PATTERN PREDICTIVE OF HELICOBACTER PYLORI INFECTION. - NEGATIVE FOR INTESTINAL METAPLASIA AND MALIGNANCY. 2. Surgical [P], esophageal biopsies - SQUAMOGLANDULAR MUCOSA, NEGATIVE FOR INTESTINAL METAPLASIA. - ACTIVE ESOPHAGITIS AND REACTIVE SQUAMOUS CHANGES, CONSISTENT WITH REFLUX EFFECT (ELONGATED PAPILLAE, BASAL HYPERPLASIA AND INTRASQUAMOUS EOSINOPHILS (FOCALLY UP TO 15 EOSINOPHILS/HIGH-POWER FIELD)). - PAS STAIN NEGATIVE FOR FUNGI, WITH A SATISFACTORY CONTROL.. - NO VIRAL CYTOPATHIC CHANGE IDENTIFIED. - NEGATIVE FOR DYSPLASIA AND MALIGNANCY.  2. There is a focus of eosinophils (up to 15/high power field) clustered around a papiila. This eosinophilic inflammation is focal but could represent an eosinophilic esophagitis pattern in the proper clinical/endoscopic setting.   Colonoscopy 07/24/2021: - Small (<1cm) hard cystic lesion. found on perianal exam - benign appearing. - Diverticulosis in the left colon and in the right colon. - One 3 mm tubular adenomatous polyp in the descending colon, removed with - Internal hemorrhoids. - Anal papilla(e) were  hypertrophied. - The examination was otherwise normal. - No further colonoscopies recommended due to age   Colonoscopy by Dr Deatra Ina 01/28/2010 done due to iron deficiency anemia: Diverticula, scattered in the ascending colon Otherwise normal examination   EGD 10/24/2008 done due to dysphagia, anemia: Stricture at the gastroesophageal junction s/p dilation 2cm hiatal hernia  Otherwise normal    Colonoscopy 10/09/2004: 2m tubular adenomatous polyp removed from the cecum   EGD 05/23/1998: Distal esophageal stricture with erosions s/p dilatation     Past Medical History:  Diagnosis Date   Abnormality of gait 10/05/2014   Alzheimer disease ([facility]    Anemia    Anxiety    Bilateral edema of lower extremity    LE Dopplers 4/22 - no thrombus or thrombophelbitis, no reflux   Blood transfusion without reported diagnosis    with surgery 2012   Cataract    removed both eyes   CHF (congestive heart failure) ([name])    Chronic kidney disease    Colon polyp    DDD (degenerative disc disease)    Depression    DJD (degenerative joint disease)    DOE (dyspnea on exertion)    Echo 04 /22/2014 - normal EF, mild LVH, grade 1 Diastolic Dysfunction; Lexiscan Myoview 04/28/12 - no ishcemia or infarction, LBBB septal wall motion; EF ~60%   Fatigue    Fatigue    pt states  he gets tired very quickly   GERD (gastroesophageal reflux disease)    H/O iron deficiency anemia    with  hemmorrhoids   Hyperlipidemia    Hypertension    Left bundle branch block     chronic, negative Myoview a normal echo as noted above    Osteoarthritis    with bilateral hip surgeries and multiple complication, with the most recent  surgery in January 2012   Scrotal edema    Type 2 diabetes mellitus without complications ([place]) [id number]   Vitamin D deficiency      Past Surgical History:  Procedure Laterality Date   Cardiology Nuclear Med Study   04/29/2012   overall impression ; NORMAL  STRESS  NUCLEAR STUDY    COLONOSCOPY  01/28/2010   HIP SURGERY Right 01/06/2002   replacement   HIP SURGERY Left 02/2010   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, had necrosis, 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 left leg   ROTATOR CUFF REPAIR Left    UPPER GASTROINTESTINAL ENDOSCOPY     dilation   Venous Duplex  04/27/2012   LOWER EXTREMITY SWELLING -Impressions [name] ; This is a normal bilateral  lower extremity venous duplex Doppler evaluation   Family History  Problem Relation Age of Onset   Dementia Mother    Hypertension Mother    Heart disease Mother    Diabetes Mother    Heart attack Maternal Grandmother    Diabetes Maternal Grandfather    Colon cancer Neg Hx    Colon polyps Neg Hx    Esophageal cancer Neg Hx    Rectal cancer Neg Hx    Stomach cancer Neg Hx    Social History   Tobacco Use   Smoking status: Former    Packs/day: 0.50    Years: 30.00    Total pack years: 15.00    Types: Cigarettes    Quit date: 03/29/1973    Years since quitting: 48.9   Smokeless tobacco: Never  Vaping Use   Vaping Use: Never used  Substance Use Topics   Alcohol use: Yes    Alcohol/week: 1.0 standard drink of alcohol    Types: 1 Standard drinks or equivalent per week    Comment: couple times a year   Drug use: No   Current Outpatient Medications  Medication Sig Dispense Refill   amLODipine (NORVASC) 10 MG tablet Take 10 mg by mouth in the morning.     atorvastatin (LIPITOR) 10 MG tablet Take 10 mg by mouth at bedtime.     doxazosin (CARDURA) 8 MG tablet Take 1 tablet (8 mg total) by mouth at bedtime.     ferrous sulfate 325 (65 FE) MG tablet Take 325 mg by mouth daily with breakfast.     finasteride (PROSCAR) 5 MG tablet Take 5 mg by mouth daily.     Melatonin 5 MG SUBL Place 5 mg under the tongue at bedtime as needed (sleep).      memantine (NAMENDA) 10 MG tablet Take 1 tablet (10 mg total) by mouth 2 (two) times daily. 180 tablet 3   Multiple Vitamins-Minerals (CENTRUM SILVER 50+MEN) TABS Take 1 tablet by mouth  daily with breakfast.     omeprazole (PRILOSEC) 40 MG capsule Take 1 capsule (40 mg total) by mouth daily. 90 capsule 1   polyethylene glycol (MIRALAX / GLYCOLAX) 17 g packet Take 17 g by mouth daily as needed for moderate constipation. (Patient taking differently: Take 17 g by mouth See admin instructions. Three times weekly)  14 each 0   QUEtiapine (SEROQUEL) 25 MG tablet Take 1 tablet (25 mg total) by mouth at bedtime. 90 tablet 3   QUEtiapine (SEROQUEL) 25 MG tablet Take 0.5 tablets (12.5 mg total) by mouth in the morning. 30 tablet 3   rivastigmine (EXELON) 3 MG capsule Take 1 capsule (3 mg total) by mouth 2 (two) times daily. 180 capsule 3   senna-docusate (SENOKOT-S) 8.6-50 MG tablet Take 1 tablet by mouth 2 (two) times daily. (Patient taking differently: Take 1 tablet by mouth daily.) 20 tablet 0   No current facility-administered medications for this visit.   No Known Allergies   Review of Systems: All systems reviewed and negative except where noted in HPI.    Lab Results  Component Value Date   CREATININE 1.47 (H) 02/18/2022   BUN 13 02/18/2022   NA 139 02/18/2022   K 4.1 02/18/2022   CL 111 02/18/2022   CO2 21 (L) 02/18/2022    Lab Results  Component Value Date   ALT 18 02/13/2022   AST 41 02/13/2022   ALKPHOS 56 02/13/2022   BILITOT 1.2 02/13/2022    Lab Results  Component Value Date   WBC 4.8 02/18/2022   HGB 8.5 (L) 02/18/2022   HCT 26.4 (L) 02/18/2022   MCV 89.8 02/18/2022   PLT 89 (L) 02/18/2022    Lab Results  Component Value Date   IRON 47 11/22/2021   TIBC 179.2 (L) 11/22/2021   FERRITIN 518.5 (H) 11/22/2021     Physical Exam: BP (!) 128/58   Pulse 78   Ht 5' 6"$  (1.676 m)   Wt 154 lb 4 oz (70 kg)   SpO2 98%   BMI 24.90 kg/m  Constitutional: Pleasant,well-developed, male in no acute distress. Neurological: Alert and oriented to person place and time. Psychiatric: Normal mood and affect. Behavior is normal.   ASSESSMENT: 77 y.o. male  here for assessment of the following  1. Dysphagia, unspecified type   2. Gastroesophageal reflux disease, unspecified whether esophagitis present   3. Long-term current use of proton pump inhibitor therapy    Fortunately he has responded quite well to dilation.  Eating has significantly improved, he has regained several pounds.  He is quite happy with how he is doing.  He has been on omeprazole for chronic reflux.  Biopsies of his esophagus had very focal changes of some increased elevation in eosinophil counts however microscopically perhaps these changes were more likely due to reflux than EoE.  We discussed that eosinophilic esophagitis remains on the differential, however post dilation and on omeprazole he feels quite well without problems.  We discussed long-term use of chronic PPI use and associated risks, he has some CKD which is stable, he feels benefits outweigh risks in terms of his ability to eat, reflux controlled, and hopefully this will prevent restructuring.  Barium swallow shows some evidence of dysmotility as well.  If he has recurrent dysphagia he will contact me, may consider repeat endoscopy with dilation as needed, and obtain additional biopsies to more formally assess for EOE, however as long as he feels well, given his comorbidities will monitor for now on PPI.  He can follow-up with me as needed should symptoms recur.  He agrees   PLAN: - continue omeprazole daily - feel benefits outweigh risks. Discussed ongoing chronic PPI use and risks for worsening CKD but stable, he will continue to have renal functional monitored - discussed multifactorial dysphagia - appears to have been driven by stricture  more so than dysmotility given response to dilation - f/u yearly or sooner if issues for repeat dilation  [name], MD [facility]

## 2022-02-27 DIAGNOSIS — K219 Gastro-esophageal reflux disease without esophagitis: Secondary | ICD-10-CM | POA: Diagnosis not present

## 2022-03-06 DIAGNOSIS — E785 Hyperlipidemia, unspecified: Secondary | ICD-10-CM | POA: Diagnosis not present

## 2022-03-06 DIAGNOSIS — N1831 Chronic kidney disease, stage 3a: Secondary | ICD-10-CM | POA: Diagnosis not present

## 2022-03-06 DIAGNOSIS — I5032 Chronic diastolic (congestive) heart failure: Secondary | ICD-10-CM | POA: Diagnosis not present

## 2022-03-06 DIAGNOSIS — I130 Hypertensive heart and chronic kidney disease with heart failure and stage 1 through stage 4 chronic kidney disease, or unspecified chronic kidney disease: Secondary | ICD-10-CM | POA: Diagnosis not present

## 2022-03-12 ENCOUNTER — Encounter (HOSPITAL_COMMUNITY): Payer: Self-pay

## 2022-03-13 ENCOUNTER — Encounter (HOSPITAL_COMMUNITY): Payer: Self-pay

## 2022-03-13 ENCOUNTER — Emergency Department (HOSPITAL_COMMUNITY)
Admission: EM | Admit: 2022-03-13 | Discharge: 2022-03-13 | Disposition: A | Discharge status: Skilled Nursing Facility | Payer: Medicare Other | Attending: Emergency Medicine | Admitting: Emergency Medicine

## 2022-03-13 DIAGNOSIS — D638 Anemia in other chronic diseases classified elsewhere: Secondary | ICD-10-CM

## 2022-03-13 DIAGNOSIS — D631 Anemia in chronic kidney disease: Secondary | ICD-10-CM | POA: Diagnosis not present

## 2022-03-13 DIAGNOSIS — I509 Heart failure, unspecified: Secondary | ICD-10-CM | POA: Insufficient documentation

## 2022-03-13 DIAGNOSIS — Z79899 Other long term (current) drug therapy: Secondary | ICD-10-CM | POA: Diagnosis not present

## 2022-03-13 DIAGNOSIS — N189 Chronic kidney disease, unspecified: Secondary | ICD-10-CM | POA: Insufficient documentation

## 2022-03-13 DIAGNOSIS — E1122 Type 2 diabetes mellitus with diabetic chronic kidney disease: Secondary | ICD-10-CM | POA: Insufficient documentation

## 2022-03-13 DIAGNOSIS — I130 Hypertensive heart and chronic kidney disease with heart failure and stage 1 through stage 4 chronic kidney disease, or unspecified chronic kidney disease: Secondary | ICD-10-CM | POA: Insufficient documentation

## 2022-03-13 DIAGNOSIS — R799 Abnormal finding of blood chemistry, unspecified: Secondary | ICD-10-CM | POA: Diagnosis present

## 2022-03-13 LAB — TYPE AND SCREEN
ABO/RH(D): O POS
Antibody Screen: NEGATIVE

## 2022-03-13 LAB — COMPREHENSIVE METABOLIC PANEL
ALT: 20 U/L (ref 0–44)
AST: 21 U/L (ref 15–41)
Albumin: 2.9 g/dL — ABNORMAL LOW (ref 3.5–5.0)
Alkaline Phosphatase: 60 U/L (ref 38–126)
Anion gap: 6 (ref 5–15)
BUN: 18 mg/dL (ref 8–23)
CO2: 21 mmol/L — ABNORMAL LOW (ref 22–32)
Calcium: 8.2 mg/dL — ABNORMAL LOW (ref 8.9–10.3)
Chloride: 113 mmol/L — ABNORMAL HIGH (ref 98–111)
Creatinine, Ser: 1.34 mg/dL — ABNORMAL HIGH (ref 0.61–1.24)
GFR, Estimated: 55 mL/min — ABNORMAL LOW (ref >60)
Glucose, Bld: 91 mg/dL (ref 70–99)
Potassium: 3.7 mmol/L (ref 3.5–5.1)
Sodium: 140 mmol/L (ref 135–145)
Total Bilirubin: 0.4 mg/dL (ref 0.3–1.2)
Total Protein: 6.4 g/dL — ABNORMAL LOW (ref 6.5–8.1)

## 2022-03-13 LAB — CBC WITH DIFFERENTIAL/PLATELET
Abs Immature Granulocytes: 0.02 10*3/uL (ref 0.00–0.07)
Basophils Absolute: 0 10*3/uL (ref 0.0–0.1)
Basophils Relative: 1 %
Eosinophils Absolute: 0.5 10*3/uL (ref 0.0–0.5)
Eosinophils Relative: 8 %
HCT: 25.2 % — ABNORMAL LOW (ref 39.0–52.0)
Hemoglobin: 8 g/dL — ABNORMAL LOW (ref 13.0–17.0)
Immature Granulocytes: 0 %
Lymphocytes Relative: 27 %
Lymphs Abs: 1.4 10*3/uL (ref 0.7–4.0)
MCH: 28.9 pg (ref 26.0–34.0)
MCHC: 31.7 g/dL (ref 30.0–36.0)
MCV: 91 fL (ref 80.0–100.0)
Monocytes Absolute: 0.4 10*3/uL (ref 0.1–1.0)
Monocytes Relative: 8 %
Neutro Abs: 3.1 10*3/uL (ref 1.7–7.7)
Neutrophils Relative %: 56 %
Platelets: 149 10*3/uL — ABNORMAL LOW (ref 150–400)
RBC: 2.77 MIL/uL — ABNORMAL LOW (ref 4.22–5.81)
RDW: 16.1 % — ABNORMAL HIGH (ref 11.5–15.5)
WBC: 5.4 10*3/uL (ref 4.0–10.5)
nRBC: 0 % (ref 0.0–0.2)

## 2022-03-13 NOTE — ED Triage Notes (Signed)
Pt presents with c/o needing a blood transfusion. Pt is from [facility] facility, had labs done 2 days ago and hemoglobin was 6.8. Per pt's recent chart hx, hx of dementia, but EMS reports pt is A&O x 4.

## 2022-03-13 NOTE — ED Provider Notes (Signed)
[facility] Provider Note   CSN: [id number] Arrival date & time: 03/13/22  1303     History  Chief Complaint  Patient presents with   Abnormal Lab    Gerald [name] is a 77 y.o. male.  With a history of iron deficiency anemia, GERD, degenerative disc disease, CHF, type 2 diabetes, chronic kidney disease, osteoarthritis, anxiety, depression who presents to the ED for evaluation of abnormal labs.  Per EMS, respiratory show his assisted living home found his hemoglobin to be 6.8 on labs that were drawn 3 days ago.  Reports that his previous level was 7.4.  Patient reports remaining asymptomatic throughout that time and specifically denies any chest pain, shortness of breath, dizziness, lightheadedness, numbness, weakness, tingling.  He denies any change to his stool color.  Denies abdominal pain.  Believes the provider "jumped the gun."  Spoke with daughter at bedside who reports that she was called today by the nurse practitioner at his assisted living home and states that his hemoglobin dropped from 7.9 to 6.8 and they were sending him to the emergency room by ambulance.   Abnormal Lab      Home Medications Prior to Admission medications   Medication Sig Start Date End Date Taking? Authorizing Provider  amLODipine (NORVASC) 10 MG tablet Take 10 mg by mouth in the morning.    [provider]  atorvastatin (LIPITOR) 10 MG tablet Take 10 mg by mouth at bedtime.    [provider]  doxazosin (CARDURA) 8 MG tablet Take 1 tablet (8 mg total) by mouth at bedtime. 09/16/21   Aline August, MD  ferrous sulfate 325 (65 FE) MG tablet Take 325 mg by mouth daily with breakfast.    [provider]  finasteride (PROSCAR) 5 MG tablet Take 5 mg by mouth daily. 10/02/21   [provider]  Melatonin 5 MG SUBL Place 5 mg under the tongue at bedtime as needed (sleep).     [provider]  memantine (NAMENDA) 10 MG  tablet Take 1 tablet (10 mg total) by mouth 2 (two) times daily. 07/25/21 07/20/22  Alric Ran, MD  Multiple Vitamins-Minerals (CENTRUM SILVER 50+MEN) TABS Take 1 tablet by mouth daily with breakfast.    [provider]  omeprazole (PRILOSEC) 40 MG capsule Take 1 capsule (40 mg total) by mouth daily. 01/12/13   Unk Pinto, MD  polyethylene glycol (MIRALAX / GLYCOLAX) 17 g packet Take 17 g by mouth daily as needed for moderate constipation. Patient taking differently: Take 17 g by mouth See admin instructions. Three times weekly 09/16/21   Aline August, MD  QUEtiapine (SEROQUEL) 25 MG tablet Take 1 tablet (25 mg total) by mouth at bedtime. 12/05/21 11/30/22  Alric Ran, MD  QUEtiapine (SEROQUEL) 25 MG tablet Take 0.5 tablets (12.5 mg total) by mouth in the morning. 02/19/22   Rai, Vernelle Emerald, MD  rivastigmine (EXELON) 3 MG capsule Take 1 capsule (3 mg total) by mouth 2 (two) times daily. 07/25/21 07/20/22  Alric Ran, MD  senna-docusate (SENOKOT-S) 8.6-50 MG tablet Take 1 tablet by mouth 2 (two) times daily. Patient taking differently: Take 1 tablet by mouth daily. 09/16/21   Aline August, MD      Allergies    Patient has no known allergies.    Review of Systems   Review of Systems  All other systems reviewed and are negative.   Physical Exam Updated Vital Signs BP (!) 171/71 (BP Location: Left Arm)  Pulse 84   Temp 98.5 F (36.9 C)   Resp 15   SpO2 100%  Physical Exam  ED Results / Procedures / Treatments   Labs (all labs ordered are listed, but only abnormal results are displayed) Labs Reviewed  COMPREHENSIVE METABOLIC PANEL - Abnormal; Notable for the following components:      Result Value   Chloride 113 (*)    CO2 21 (*)    Creatinine, Ser 1.34 (*)    Calcium 8.2 (*)    Total Protein 6.4 (*)    Albumin 2.9 (*)    GFR, Estimated 55 (*)    All other components within normal limits  CBC WITH DIFFERENTIAL/PLATELET - Abnormal; Notable for the  following components:   RBC 2.77 (*)    Hemoglobin 8.0 (*)    HCT 25.2 (*)    RDW 16.1 (*)    Platelets 149 (*)    All other components within normal limits  TYPE AND SCREEN    EKG None  Radiology No results found.  Procedures Procedures    Medications Ordered in ED Medications - No data to display  ED Course/ Medical Decision Making/ A&P                             Medical Decision Making Amount and/or Complexity of Data Reviewed Labs: ordered.  This patient presents to the ED for concern of abnormal lab, this involves an extensive number of treatment options, and is a complaint that carries with it a high risk of complications and morbidity.  The differential diagnosis includes symptomatic anemia, significant anemia   Co morbidities that complicate the patient evaluation  iron deficiency anemia, GERD, degenerative disc disease, CHF, type 2 diabetes, chronic kidney disease, osteoarthritis, anxiety, depression  My initial workup includes basic labs, type and screen  Additional history obtained from: Nursing notes from this visit. Family daughter provides a EMS provides a portion of the history  I ordered, reviewed and interpreted labs which include: CBC, CMP, type and screen.  Hemoglobin 8.0 which is near baseline compared to readings from the last month.  Stable elevated creatinine of 1.34 with a normal BUN and anion gap.  Bicarb chronically decreased and 21 at baseline  Afebrile, hypertensive but otherwise hemodynamically stable.  77 year old male presenting to ED for evaluation of abnormal lab found at his nursing home.  His hemoglobin here is 8.0 which is similar to baseline compared to the past month.  He adamantly denies all symptoms and states that he feels completely normal.  Wonder if there is his lab aberrancy or if this reading from his assisted living home came from a point-of-care test.  With a hemoglobin of 8 and an absence of symptoms, patient is safe for  discharge back to his assisted living facility.  This is what he was requesting as well.  His physical exam is reassuring and he has no adventitious breath sounds or new heart murmurs.  He does not appear significantly pale and has no pallor.  He was encouraged to follow-up with his primary care provider regarding his low hemoglobin and to continue getting his injections that he is scheduled for.  He was given return precautions.  Stable at discharge.  At this time there does not appear to be any evidence of an acute emergency medical condition and the patient appears stable for discharge with appropriate outpatient follow up. Diagnosis was discussed with patient who verbalizes understanding of  care plan and is agreeable to discharge. I have discussed return precautions with patient and daughter who verbalizes understanding. Patient encouraged to follow-up with their PCP within 1 week. All questions answered.  Patient's case discussed with Dr. Langston Masker who agrees with plan to discharge with follow-up.   Note: Portions of this report may have been transcribed using voice recognition software. Every effort was made to ensure accuracy; however, inadvertent computerized transcription errors may still be present.        Final Clinical Impression(s) / ED Diagnoses Final diagnoses:  Anemia of chronic disease    Rx / DC Orders ED Discharge Orders     None         Roylene Reason, Hershal Coria 03/13/22 1551    Wyvonnia Dusky, MD 03/13/22 1730

## 2022-03-13 NOTE — Discharge Instructions (Addendum)
You have been seen today for your complaint of abnormal lab, low hemoglobin. Your lab work revealed that your hemoglobin is in normal range for you. Follow up with: Your primary care provider in 1 week for reevaluation Please seek immediate medical care if you develop any of the following symptoms: You are short of breath. You have pain in your abdomen or chest. You are dizzy or feel faint. You have trouble concentrating. You have bloody stools, black stools, or tarry stools. You vomit repeatedly or you vomit up blood. At this time there does not appear to be the presence of an emergent medical condition, however there is always the potential for conditions to change. Please read and follow the below instructions.  Do not take your medicine if  develop an itchy rash, swelling in your mouth or lips, or difficulty breathing; call 911 and seek immediate emergency medical attention if this occurs.  You may review your lab tests and imaging results in their entirety on your MyChart account.  Please discuss all results of fully with your primary care provider and other specialist at your follow-up visit.  Note: Portions of this text may have been transcribed using voice recognition software. Every effort was made to ensure accuracy; however, inadvertent computerized transcription errors may still be present.

## 2022-03-13 NOTE — ED Provider Triage Note (Signed)
Emergency Medicine Provider Triage Evaluation Note  Gerald [name] , a 77 y.o. male  was evaluated in triage.  Pt complains of low hemoglobin.  States he had blood drawn 2 to 3 days ago and instructed to his facility, it was low today and sent him to the ED by EMS.  States she was "getting ahead of herself."  The feels at his baseline and denies shortness of breath, dizziness, lightheadedness, melena, hematochezia, abdominal pain, nausea, vomiting.  Has been getting "blood shots" monthly for the past 2 months to help with his anemia  Review of Systems  Positive: As above Negative: As above  Physical Exam  BP (!) 164/65 (BP Location: Left Arm)   Pulse 80   Temp 98.8 F (37.1 C) (Oral)   Resp 18   SpO2 100%  Gen:   Awake, no distress   Resp:  Normal effort  MSK:   Moves extremities without difficulty  Other:  No abdominal TTP  Medical Decision Making  Medically screening exam initiated at 1:16 PM.  Appropriate orders placed.  Gerald [name] was informed that the remainder of the evaluation will be completed by another provider, this initial triage assessment does not replace that evaluation, and the importance of remaining in the ED until their evaluation is complete.     Gerald [name], [place] 03/13/22 1317

## 2022-03-13 NOTE — ED Notes (Signed)
3x attempts made to contact [facility] with no answer from phone.

## 2022-03-17 ENCOUNTER — Encounter (HOSPITAL_COMMUNITY): Payer: Medicare Other

## 2022-03-17 ENCOUNTER — Ambulatory Visit (HOSPITAL_COMMUNITY)
Admission: RE | Admit: 2022-03-17 | Discharge: 2022-03-17 | Disposition: A | Discharge status: Home / Self Care | Payer: Medicare Other | Source: Ambulatory Visit | Attending: Nephrology | Admitting: Nephrology

## 2022-03-17 ENCOUNTER — Other Ambulatory Visit (HOSPITAL_COMMUNITY): Payer: Self-pay | Admitting: *Deleted

## 2022-03-17 VITALS — BP 133/70 | HR 94 | Temp 97.2°F | Resp 17

## 2022-03-17 DIAGNOSIS — D649 Anemia, unspecified: Secondary | ICD-10-CM

## 2022-03-17 DIAGNOSIS — D638 Anemia in other chronic diseases classified elsewhere: Secondary | ICD-10-CM | POA: Diagnosis present

## 2022-03-17 LAB — IRON AND TIBC
Iron: 46 ug/dL (ref 45–182)
Saturation Ratios: 30 % (ref 17.9–39.5)
TIBC: 153 ug/dL — ABNORMAL LOW (ref 250–450)
UIBC: 107 ug/dL

## 2022-03-17 LAB — POCT HEMOGLOBIN-HEMACUE: Hemoglobin: 6.7 g/dL — CL (ref 13.0–17.0)

## 2022-03-17 LAB — FERRITIN: Ferritin: 753 ng/mL — ABNORMAL HIGH (ref 24–336)

## 2022-03-17 MED ORDER — EPOETIN ALFA-EPBX 10000 UNIT/ML IJ SOLN: 10000.0000 [IU] | INTRAMUSCULAR | Status: DC

## 2022-03-17 MED ORDER — EPOETIN ALFA-EPBX 10000 UNIT/ML IJ SOLN
INTRAMUSCULAR | Status: AC
  Administered 2022-03-17: 10000 [IU] via SUBCUTANEOUS
  Filled 2022-03-17: qty 1

## 2022-03-17 NOTE — Progress Notes (Signed)
Pt received retacrit injection and will come in 03-19-2022 at 0800 for 1 unit PRBCs

## 2022-03-17 NOTE — Progress Notes (Signed)
Pt here for Retacrit injection and labs.  Hemocue shows 6.3. Repeat is 6.7.  Amber at [facility] notified and will call back with instructions

## 2022-03-19 ENCOUNTER — Encounter (HOSPITAL_COMMUNITY)
Admission: RE | Admit: 2022-03-19 | Discharge: 2022-03-19 | Disposition: A | Discharge status: Home / Self Care | Payer: Medicare Other | Source: Ambulatory Visit | Attending: Nephrology | Admitting: Nephrology

## 2022-03-19 ENCOUNTER — Encounter (HOSPITAL_COMMUNITY): Payer: Medicare Other

## 2022-03-19 DIAGNOSIS — D649 Anemia, unspecified: Secondary | ICD-10-CM | POA: Diagnosis not present

## 2022-03-19 LAB — PREPARE RBC (CROSSMATCH)

## 2022-03-19 MED ORDER — SODIUM CHLORIDE 0.9% IV SOLUTION: Freq: Once | INTRAVENOUS | Status: DC

## 2022-03-20 LAB — BPAM RBC
Blood Product Expiration Date: 202404092359
ISSUE DATE / TIME: 202403130928
Unit Type and Rh: 5100

## 2022-03-20 LAB — TYPE AND SCREEN
ABO/RH(D): O POS
Antibody Screen: NEGATIVE
Unit division: 0

## 2022-03-26 ENCOUNTER — Encounter (HOSPITAL_COMMUNITY): Payer: Self-pay

## 2022-04-02 ENCOUNTER — Encounter: Payer: Self-pay | Admitting: Neurology

## 2022-04-02 ENCOUNTER — Ambulatory Visit (INDEPENDENT_AMBULATORY_CARE_PROVIDER_SITE_OTHER): Payer: Medicare Other | Admitting: Neurology

## 2022-04-02 VITALS — BP 152/65 | HR 78

## 2022-04-02 DIAGNOSIS — G301 Alzheimer's disease with late onset: Secondary | ICD-10-CM

## 2022-04-02 DIAGNOSIS — F01B18 Vascular dementia, moderate, with other behavioral disturbance: Secondary | ICD-10-CM

## 2022-04-02 DIAGNOSIS — F02B18 Dementia in other diseases classified elsewhere, moderate, with other behavioral disturbance: Secondary | ICD-10-CM

## 2022-04-02 NOTE — Patient Instructions (Addendum)
Continue current medications including Namenda 10 mg twice daily and Exelon 3 mg twice daily Would recommend starting B12 supplement, 1000 mcg daily Continue to follow-up with your doctor regarding treatment of edema. I have recommended using compression stockings Will obtain ATN profile to look for Alzheimer disease biomarkers Follow-up in a year or sooner if worse.
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
## 2022-04-05 LAB — ATN PROFILE
A -- Beta-amyloid 42/40 Ratio: 0.124 (ref >0.102)
Beta-amyloid 40: 353.09 pg/mL
Beta-amyloid 42: 43.91 pg/mL
N -- NfL, Plasma: 13.6 pg/mL — ABNORMAL HIGH (ref 0.00–7.64)
T -- p-tau181: 2.06 pg/mL — ABNORMAL HIGH (ref 0.00–0.97)

## 2022-04-14 ENCOUNTER — Ambulatory Visit (HOSPITAL_COMMUNITY)
Admission: RE | Admit: 2022-04-14 | Discharge: 2022-04-14 | Disposition: A | Discharge status: Home / Self Care | Payer: Medicare Other | Source: Ambulatory Visit | Attending: Nephrology | Admitting: Nephrology

## 2022-04-14 VITALS — BP 156/64 | HR 80 | Temp 97.6°F | Resp 18

## 2022-04-14 DIAGNOSIS — D638 Anemia in other chronic diseases classified elsewhere: Secondary | ICD-10-CM

## 2022-04-14 LAB — FERRITIN: Ferritin: 531 ng/mL — ABNORMAL HIGH (ref 24–336)

## 2022-04-14 LAB — IRON AND TIBC
Iron: 70 ug/dL (ref 45–182)
Saturation Ratios: 47 % — ABNORMAL HIGH (ref 17.9–39.5)
TIBC: 150 ug/dL — ABNORMAL LOW (ref 250–450)
UIBC: 80 ug/dL

## 2022-04-14 MED ORDER — EPOETIN ALFA-EPBX 10000 UNIT/ML IJ SOLN: 10000.0000 [IU] | INTRAMUSCULAR | Status: DC

## 2022-04-14 MED ORDER — EPOETIN ALFA-EPBX 10000 UNIT/ML IJ SOLN
INTRAMUSCULAR | Status: AC
  Administered 2022-04-14: 10000 [IU] via SUBCUTANEOUS
  Filled 2022-04-14: qty 1

## 2022-04-15 LAB — POCT HEMOGLOBIN-HEMACUE: Hemoglobin: 8.1 g/dL — ABNORMAL LOW (ref 13.0–17.0)

## 2022-04-30 ENCOUNTER — Ambulatory Visit: Payer: Medicare Other | Admitting: Podiatry

## 2022-05-12 ENCOUNTER — Ambulatory Visit (HOSPITAL_COMMUNITY)
Admission: RE | Admit: 2022-05-12 | Discharge: 2022-05-12 | Disposition: A | Discharge status: Home / Self Care | Payer: Medicare Other | Source: Ambulatory Visit | Attending: Nephrology | Admitting: Nephrology

## 2022-05-12 VITALS — BP 159/65 | HR 84 | Temp 97.1°F | Resp 17

## 2022-05-12 DIAGNOSIS — D638 Anemia in other chronic diseases classified elsewhere: Secondary | ICD-10-CM

## 2022-05-12 LAB — IRON AND TIBC
Iron: 53 ug/dL (ref 45–182)
Saturation Ratios: 43 % — ABNORMAL HIGH (ref 17.9–39.5)
TIBC: 123 ug/dL — ABNORMAL LOW (ref 250–450)
UIBC: 70 ug/dL

## 2022-05-12 LAB — FERRITIN: Ferritin: 640 ng/mL — ABNORMAL HIGH (ref 24–336)

## 2022-05-12 MED ORDER — EPOETIN ALFA-EPBX 10000 UNIT/ML IJ SOLN
10000.0000 [IU] | INTRAMUSCULAR | Status: DC
  Administered 2022-05-12: 10000 [IU] via SUBCUTANEOUS

## 2022-05-12 MED ORDER — EPOETIN ALFA-EPBX 10000 UNIT/ML IJ SOLN
INTRAMUSCULAR | Status: AC
  Filled 2022-05-12: qty 1

## 2022-05-13 LAB — POCT HEMOGLOBIN-HEMACUE: Hemoglobin: 7.8 g/dL — ABNORMAL LOW (ref 13.0–17.0)

## 2022-06-09 ENCOUNTER — Ambulatory Visit (HOSPITAL_COMMUNITY)
Admission: RE | Admit: 2022-06-09 | Discharge: 2022-06-09 | Disposition: A | Discharge status: Home / Self Care | Payer: Medicare Other | Source: Ambulatory Visit | Attending: Nephrology | Admitting: Nephrology

## 2022-06-09 VITALS — BP 144/62 | HR 82 | Temp 97.0°F | Resp 18

## 2022-06-09 DIAGNOSIS — D638 Anemia in other chronic diseases classified elsewhere: Secondary | ICD-10-CM

## 2022-06-09 LAB — IRON AND TIBC
Iron: 59 ug/dL (ref 45–182)
Saturation Ratios: 36 % (ref 17.9–39.5)
TIBC: 165 ug/dL — ABNORMAL LOW (ref 250–450)
UIBC: 106 ug/dL

## 2022-06-09 LAB — FERRITIN: Ferritin: 554 ng/mL — ABNORMAL HIGH (ref 24–336)

## 2022-06-09 LAB — POCT HEMOGLOBIN-HEMACUE: Hemoglobin: 7.5 g/dL — ABNORMAL LOW (ref 13.0–17.0)

## 2022-06-09 MED ORDER — EPOETIN ALFA-EPBX 10000 UNIT/ML IJ SOLN
20000.0000 [IU] | INTRAMUSCULAR | Status: DC
  Administered 2022-06-09: 20000 [IU] via SUBCUTANEOUS

## 2022-06-09 MED ORDER — EPOETIN ALFA-EPBX 10000 UNIT/ML IJ SOLN
INTRAMUSCULAR | Status: AC
  Filled 2022-06-09: qty 2

## 2022-06-30 ENCOUNTER — Telehealth: Payer: Self-pay | Admitting: Neurology

## 2022-06-30 ENCOUNTER — Telehealth: Payer: Self-pay

## 2022-06-30 NOTE — Telephone Encounter (Signed)
Call to wife, she states that patient says he is getting out of facility because his mind is better. She states she can't care for him at home due to being non compliant with meals, medications, and having multiple falls. She understands Dr. Teresa Coombs will be out of the office until 07/14/22 and sh eis fine waiting until then I advised her I will send her his primary Diagnosis, medications, and MMSE score via my chart. I also encouraged her to follow up with husbands social worker at facility for advise. Wife verbalized understanding.

## 2022-06-30 NOTE — Telephone Encounter (Signed)
Sent to dr. Teresa Coombs

## 2022-06-30 NOTE — Telephone Encounter (Signed)
Wife is asking for a call from RN to discuss that the facility that pt is residing had their Dr to complete FL2, a provider that does not know pt like Dr Teresa Coombs does.  Wife is being told that pt is scoring a 15 on assessments which means he is considered as being competent enough to be able to come home.  Wife is against this, she states she would like to know if Dr Teresa Coombs will do a FL2 based on him seeing pt, please call wife.

## 2022-07-07 ENCOUNTER — Encounter (HOSPITAL_COMMUNITY)
Admission: RE | Admit: 2022-07-07 | Discharge: 2022-07-07 | Disposition: A | Discharge status: Home / Self Care | Payer: Medicare Other | Source: Ambulatory Visit | Attending: Nephrology | Admitting: Nephrology

## 2022-07-07 VITALS — BP 151/60 | HR 80 | Temp 97.0°F | Resp 17

## 2022-07-07 DIAGNOSIS — D638 Anemia in other chronic diseases classified elsewhere: Secondary | ICD-10-CM

## 2022-07-07 LAB — POCT HEMOGLOBIN-HEMACUE: Hemoglobin: 9 g/dL — ABNORMAL LOW (ref 13.0–17.0)

## 2022-07-07 LAB — IRON AND TIBC
Iron: 64 ug/dL (ref 45–182)
Saturation Ratios: 32 % (ref 17.9–39.5)
TIBC: 203 ug/dL — ABNORMAL LOW (ref 250–450)
UIBC: 139 ug/dL

## 2022-07-07 LAB — FERRITIN: Ferritin: 455 ng/mL — ABNORMAL HIGH (ref 24–336)

## 2022-07-07 MED ORDER — EPOETIN ALFA-EPBX 10000 UNIT/ML IJ SOLN
INTRAMUSCULAR | Status: AC
  Filled 2022-07-07: qty 2

## 2022-07-07 MED ORDER — EPOETIN ALFA-EPBX 10000 UNIT/ML IJ SOLN
20000.0000 [IU] | INTRAMUSCULAR | Status: DC
  Administered 2022-07-07: 20000 [IU] via SUBCUTANEOUS

## 2022-07-13 NOTE — Telephone Encounter (Signed)
They need to work with Child psychotherapist to find him a facility or get a 24 hours care as wife cannot care for him at home.

## 2022-07-14 ENCOUNTER — Telehealth: Payer: Self-pay

## 2022-07-14 NOTE — Telephone Encounter (Signed)
My Chart message sent

## 2022-07-28 ENCOUNTER — Encounter (HOSPITAL_COMMUNITY): Payer: Self-pay

## 2022-08-04 ENCOUNTER — Ambulatory Visit (HOSPITAL_COMMUNITY)
Admission: RE | Admit: 2022-08-04 | Discharge: 2022-08-04 | Disposition: A | Discharge status: Home / Self Care | Payer: Medicare Other | Source: Ambulatory Visit | Attending: Nephrology | Admitting: Nephrology

## 2022-08-04 VITALS — BP 156/66 | HR 75 | Temp 97.4°F | Resp 18

## 2022-08-04 DIAGNOSIS — D638 Anemia in other chronic diseases classified elsewhere: Secondary | ICD-10-CM | POA: Insufficient documentation

## 2022-08-04 LAB — POCT HEMOGLOBIN-HEMACUE: Hemoglobin: 10.1 g/dL — ABNORMAL LOW (ref 13.0–17.0)

## 2022-08-04 MED ORDER — EPOETIN ALFA-EPBX 10000 UNIT/ML IJ SOLN: 20000.0000 [IU] | INTRAMUSCULAR | Status: DC

## 2022-08-04 MED ORDER — EPOETIN ALFA-EPBX 10000 UNIT/ML IJ SOLN
INTRAMUSCULAR | Status: AC
  Administered 2022-08-04: 20000 [IU] via SUBCUTANEOUS
  Filled 2022-08-04: qty 2

## 2022-08-28 ENCOUNTER — Telehealth: Payer: Self-pay | Admitting: Neurology

## 2022-08-28 NOTE — Telephone Encounter (Signed)
Pt's wife is asking for a call to discuss a meeting that [name] has set up for 9-4 to discuss them discharging pt, wife would like a call to discuss with RN please call
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
## 2022-09-01 ENCOUNTER — Ambulatory Visit (HOSPITAL_COMMUNITY)
Admission: RE | Admit: 2022-09-01 | Discharge: 2022-09-01 | Disposition: A | Discharge status: Home / Self Care | Payer: Medicare Other | Source: Ambulatory Visit | Attending: Nephrology | Admitting: Nephrology

## 2022-09-01 VITALS — BP 160/64 | HR 81 | Temp 97.6°F | Resp 17

## 2022-09-01 DIAGNOSIS — D638 Anemia in other chronic diseases classified elsewhere: Secondary | ICD-10-CM | POA: Diagnosis present

## 2022-09-01 LAB — IRON AND TIBC
Iron: 92 ug/dL (ref 45–182)
Saturation Ratios: 46 % — ABNORMAL HIGH (ref 17.9–39.5)
TIBC: 202 ug/dL — ABNORMAL LOW (ref 250–450)
UIBC: 110 ug/dL

## 2022-09-01 LAB — POCT HEMOGLOBIN-HEMACUE: Hemoglobin: 10 g/dL — ABNORMAL LOW (ref 13.0–17.0)

## 2022-09-01 LAB — FERRITIN: Ferritin: 373 ng/mL — ABNORMAL HIGH (ref 24–336)

## 2022-09-01 MED ORDER — EPOETIN ALFA-EPBX 10000 UNIT/ML IJ SOLN
INTRAMUSCULAR | Status: AC
  Filled 2022-09-01: qty 2

## 2022-09-01 MED ORDER — EPOETIN ALFA-EPBX 10000 UNIT/ML IJ SOLN
20000.0000 [IU] | INTRAMUSCULAR | Status: DC
  Administered 2022-09-01: 20000 [IU] via SUBCUTANEOUS

## 2022-09-01 NOTE — Telephone Encounter (Signed)
Pt's wife called wanting to know if there is any updates on when she will be receiving a call. Please advise.

## 2022-09-02 NOTE — Telephone Encounter (Signed)
Called spouse (number listed on epic), no answer and unable to leave a message. If she calls back, please ask for a good number to reach her or her daughter

## 2022-09-04 NOTE — Telephone Encounter (Signed)
Pt wife came into office, picking up medical records to bring to [facility]. States she has a meeting with them next Wednesday 09/10/2022 and needs advise on what to do. Pt states phone number on file was incorrect. Best number to call is [phone].
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
## 2022-09-29 ENCOUNTER — Encounter (HOSPITAL_COMMUNITY): Payer: Medicare Other

## 2022-10-01 ENCOUNTER — Ambulatory Visit (HOSPITAL_COMMUNITY)
Admission: RE | Admit: 2022-10-01 | Discharge: 2022-10-01 | Disposition: A | Discharge status: Home / Self Care | Payer: Medicare Other | Source: Ambulatory Visit | Attending: Nephrology | Admitting: Nephrology

## 2022-10-01 VITALS — BP 173/69 | HR 76 | Temp 98.4°F | Resp 17

## 2022-10-01 DIAGNOSIS — D638 Anemia in other chronic diseases classified elsewhere: Secondary | ICD-10-CM | POA: Insufficient documentation

## 2022-10-01 LAB — POCT HEMOGLOBIN-HEMACUE: Hemoglobin: 10.4 g/dL — ABNORMAL LOW (ref 13.0–17.0)

## 2022-10-01 LAB — IRON AND TIBC
Iron: 84 ug/dL (ref 45–182)
Saturation Ratios: 40 % — ABNORMAL HIGH (ref 17.9–39.5)
TIBC: 213 ug/dL — ABNORMAL LOW (ref 250–450)
UIBC: 129 ug/dL

## 2022-10-01 LAB — FERRITIN: Ferritin: 450 ng/mL — ABNORMAL HIGH (ref 24–336)

## 2022-10-01 MED ORDER — EPOETIN ALFA-EPBX 10000 UNIT/ML IJ SOLN
INTRAMUSCULAR | Status: AC
  Filled 2022-10-01: qty 2

## 2022-10-01 MED ORDER — EPOETIN ALFA-EPBX 10000 UNIT/ML IJ SOLN
20000.0000 [IU] | INTRAMUSCULAR | Status: DC
  Administered 2022-10-01: 20000 [IU] via SUBCUTANEOUS

## 2022-10-02 ENCOUNTER — Encounter (HOSPITAL_COMMUNITY): Payer: Self-pay

## 2022-10-29 ENCOUNTER — Ambulatory Visit (HOSPITAL_COMMUNITY)
Admission: RE | Admit: 2022-10-29 | Discharge: 2022-10-29 | Disposition: A | Discharge status: Home / Self Care | Payer: Medicare Other | Source: Ambulatory Visit | Attending: Nephrology | Admitting: Nephrology

## 2022-10-29 VITALS — BP 162/72 | HR 90 | Temp 97.9°F | Resp 17

## 2022-10-29 DIAGNOSIS — D638 Anemia in other chronic diseases classified elsewhere: Secondary | ICD-10-CM | POA: Insufficient documentation

## 2022-10-29 LAB — IRON AND TIBC
Iron: 62 ug/dL (ref 45–182)
Saturation Ratios: 32 % (ref 17.9–39.5)
TIBC: 196 ug/dL — ABNORMAL LOW (ref 250–450)
UIBC: 134 ug/dL

## 2022-10-29 LAB — RENAL FUNCTION PANEL
Albumin: 3 g/dL — ABNORMAL LOW (ref 3.5–5.0)
Anion gap: 7 (ref 5–15)
BUN: 41 mg/dL — ABNORMAL HIGH (ref 8–23)
CO2: 19 mmol/L — ABNORMAL LOW (ref 22–32)
Calcium: 8.9 mg/dL (ref 8.9–10.3)
Chloride: 117 mmol/L — ABNORMAL HIGH (ref 98–111)
Creatinine, Ser: 2.2 mg/dL — ABNORMAL HIGH (ref 0.61–1.24)
GFR, Estimated: 30 mL/min — ABNORMAL LOW (ref >60)
Glucose, Bld: 140 mg/dL — ABNORMAL HIGH (ref 70–99)
Phosphorus: 3.1 mg/dL (ref 2.5–4.6)
Potassium: 4 mmol/L (ref 3.5–5.1)
Sodium: 143 mmol/L (ref 135–145)

## 2022-10-29 LAB — FERRITIN: Ferritin: 541 ng/mL — ABNORMAL HIGH (ref 24–336)

## 2022-10-29 LAB — POCT HEMOGLOBIN-HEMACUE: Hemoglobin: 9.8 g/dL — ABNORMAL LOW (ref 13.0–17.0)

## 2022-10-29 MED ORDER — EPOETIN ALFA-EPBX 10000 UNIT/ML IJ SOLN
INTRAMUSCULAR | Status: AC
  Filled 2022-10-29: qty 2

## 2022-10-29 MED ORDER — EPOETIN ALFA-EPBX 10000 UNIT/ML IJ SOLN
20000.0000 [IU] | INTRAMUSCULAR | Status: DC
  Administered 2022-10-29: 20000 [IU] via SUBCUTANEOUS

## 2022-11-19 ENCOUNTER — Encounter (HOSPITAL_COMMUNITY): Payer: Self-pay

## 2022-11-26 ENCOUNTER — Ambulatory Visit (HOSPITAL_COMMUNITY)
Admission: RE | Admit: 2022-11-26 | Discharge: 2022-11-26 | Disposition: A | Discharge status: Home / Self Care | Payer: 59 | Source: Ambulatory Visit | Attending: Nephrology | Admitting: Nephrology

## 2022-11-26 ENCOUNTER — Encounter (HOSPITAL_COMMUNITY): Payer: Self-pay

## 2022-11-26 VITALS — BP 133/69 | HR 83 | Temp 98.5°F | Resp 17

## 2022-11-26 DIAGNOSIS — D638 Anemia in other chronic diseases classified elsewhere: Secondary | ICD-10-CM | POA: Insufficient documentation

## 2022-11-26 DIAGNOSIS — N1832 Chronic kidney disease, stage 3b: Secondary | ICD-10-CM | POA: Diagnosis not present

## 2022-11-26 DIAGNOSIS — N183 Chronic kidney disease, stage 3 unspecified: Secondary | ICD-10-CM | POA: Insufficient documentation

## 2022-11-26 DIAGNOSIS — D631 Anemia in chronic kidney disease: Secondary | ICD-10-CM | POA: Diagnosis not present

## 2022-11-26 DIAGNOSIS — Z79890 Hormone replacement therapy (postmenopausal): Secondary | ICD-10-CM | POA: Insufficient documentation

## 2022-11-26 LAB — IRON AND TIBC
Iron: 62 ug/dL (ref 45–182)
Saturation Ratios: 31 % (ref 17.9–39.5)
TIBC: 199 ug/dL — ABNORMAL LOW (ref 250–450)
UIBC: 137 ug/dL

## 2022-11-26 LAB — POCT HEMOGLOBIN-HEMACUE: Hemoglobin: 10 g/dL — ABNORMAL LOW (ref 13.0–17.0)

## 2022-11-26 LAB — FERRITIN: Ferritin: 639 ng/mL — ABNORMAL HIGH (ref 24–336)

## 2022-11-26 MED ORDER — EPOETIN ALFA-EPBX 10000 UNIT/ML IJ SOLN
20000.0000 [IU] | INTRAMUSCULAR | Status: DC
  Administered 2022-11-26: 20000 [IU] via SUBCUTANEOUS

## 2022-11-26 MED ORDER — EPOETIN ALFA-EPBX 10000 UNIT/ML IJ SOLN
INTRAMUSCULAR | Status: AC
  Filled 2022-11-26: qty 2

## 2022-12-01 ENCOUNTER — Ambulatory Visit: Payer: Medicare Other | Admitting: Neurology

## 2022-12-24 ENCOUNTER — Ambulatory Visit (HOSPITAL_COMMUNITY)
Admission: RE | Admit: 2022-12-24 | Discharge: 2022-12-24 | Disposition: A | Discharge status: Home / Self Care | Payer: 59 | Source: Ambulatory Visit | Attending: Nephrology | Admitting: Nephrology

## 2022-12-24 VITALS — BP 131/63 | HR 76 | Temp 98.4°F | Resp 18

## 2022-12-24 DIAGNOSIS — D638 Anemia in other chronic diseases classified elsewhere: Secondary | ICD-10-CM | POA: Diagnosis present

## 2022-12-24 DIAGNOSIS — N183 Chronic kidney disease, stage 3 unspecified: Secondary | ICD-10-CM | POA: Insufficient documentation

## 2022-12-24 DIAGNOSIS — D631 Anemia in chronic kidney disease: Secondary | ICD-10-CM | POA: Insufficient documentation

## 2022-12-24 LAB — IRON AND TIBC
Iron: 43 ug/dL — ABNORMAL LOW (ref 45–182)
Saturation Ratios: 23 % (ref 17.9–39.5)
TIBC: 189 ug/dL — ABNORMAL LOW (ref 250–450)
UIBC: 146 ug/dL

## 2022-12-24 LAB — POCT HEMOGLOBIN-HEMACUE: Hemoglobin: 9.6 g/dL — ABNORMAL LOW (ref 13.0–17.0)

## 2022-12-24 LAB — FERRITIN: Ferritin: 510 ng/mL — ABNORMAL HIGH (ref 24–336)

## 2022-12-24 MED ORDER — EPOETIN ALFA-EPBX 10000 UNIT/ML IJ SOLN
INTRAMUSCULAR | Status: AC
  Administered 2022-12-24: 20000 [IU] via SUBCUTANEOUS
  Filled 2022-12-24: qty 2

## 2022-12-24 MED ORDER — EPOETIN ALFA-EPBX 10000 UNIT/ML IJ SOLN: 20000.0000 [IU] | INTRAMUSCULAR | Status: DC

## 2023-01-02 ENCOUNTER — Encounter (HOSPITAL_COMMUNITY): Payer: 59

## 2023-01-21 ENCOUNTER — Ambulatory Visit (HOSPITAL_COMMUNITY)
Admission: RE | Admit: 2023-01-21 | Discharge: 2023-01-21 | Disposition: A | Discharge status: Home / Self Care | Payer: 59 | Source: Ambulatory Visit | Attending: Nephrology | Admitting: Nephrology

## 2023-01-21 VITALS — BP 141/66 | HR 85 | Resp 17

## 2023-01-21 DIAGNOSIS — D638 Anemia in other chronic diseases classified elsewhere: Secondary | ICD-10-CM

## 2023-01-21 DIAGNOSIS — D631 Anemia in chronic kidney disease: Secondary | ICD-10-CM | POA: Insufficient documentation

## 2023-01-21 DIAGNOSIS — N1832 Chronic kidney disease, stage 3b: Secondary | ICD-10-CM | POA: Insufficient documentation

## 2023-01-21 LAB — POCT HEMOGLOBIN-HEMACUE: Hemoglobin: 10.4 g/dL — ABNORMAL LOW (ref 13.0–17.0)

## 2023-01-21 LAB — IRON AND TIBC
Iron: 71 ug/dL (ref 45–182)
Saturation Ratios: 32 % (ref 17.9–39.5)
TIBC: 223 ug/dL — ABNORMAL LOW (ref 250–450)
UIBC: 152 ug/dL

## 2023-01-21 LAB — FERRITIN: Ferritin: 493 ng/mL — ABNORMAL HIGH (ref 24–336)

## 2023-01-21 MED ORDER — EPOETIN ALFA-EPBX 10000 UNIT/ML IJ SOLN
20000.0000 [IU] | INTRAMUSCULAR | Status: DC
  Administered 2023-01-21: 20000 [IU] via SUBCUTANEOUS

## 2023-01-21 MED ORDER — EPOETIN ALFA-EPBX 10000 UNIT/ML IJ SOLN
INTRAMUSCULAR | Status: AC
  Filled 2023-01-21: qty 2

## 2023-02-18 ENCOUNTER — Ambulatory Visit (HOSPITAL_COMMUNITY)
Admission: RE | Admit: 2023-02-18 | Discharge: 2023-02-18 | Disposition: A | Discharge status: Home / Self Care | Payer: 59 | Source: Ambulatory Visit | Attending: Nephrology | Admitting: Nephrology

## 2023-02-18 VITALS — BP 146/72 | HR 86 | Temp 97.5°F | Resp 17

## 2023-02-18 DIAGNOSIS — D631 Anemia in chronic kidney disease: Secondary | ICD-10-CM | POA: Insufficient documentation

## 2023-02-18 DIAGNOSIS — Z79890 Hormone replacement therapy (postmenopausal): Secondary | ICD-10-CM | POA: Diagnosis not present

## 2023-02-18 DIAGNOSIS — N183 Chronic kidney disease, stage 3 unspecified: Secondary | ICD-10-CM | POA: Diagnosis present

## 2023-02-18 DIAGNOSIS — D638 Anemia in other chronic diseases classified elsewhere: Secondary | ICD-10-CM

## 2023-02-18 DIAGNOSIS — N1832 Chronic kidney disease, stage 3b: Secondary | ICD-10-CM | POA: Diagnosis not present

## 2023-02-18 LAB — IRON AND TIBC
Iron: 54 ug/dL (ref 45–182)
Saturation Ratios: 25 % (ref 17.9–39.5)
TIBC: 218 ug/dL — ABNORMAL LOW (ref 250–450)
UIBC: 164 ug/dL

## 2023-02-18 LAB — POCT HEMOGLOBIN-HEMACUE: Hemoglobin: 10.4 g/dL — ABNORMAL LOW (ref 13.0–17.0)

## 2023-02-18 LAB — FERRITIN: Ferritin: 621 ng/mL — ABNORMAL HIGH (ref 24–336)

## 2023-02-18 MED ORDER — EPOETIN ALFA-EPBX 10000 UNIT/ML IJ SOLN
INTRAMUSCULAR | Status: AC
  Filled 2023-02-18: qty 2

## 2023-02-18 MED ORDER — EPOETIN ALFA-EPBX 10000 UNIT/ML IJ SOLN
20000.0000 [IU] | INTRAMUSCULAR | Status: DC
  Administered 2023-02-18: 20000 [IU] via SUBCUTANEOUS

## 2023-03-10 ENCOUNTER — Other Ambulatory Visit: Payer: Self-pay | Admitting: Neurology

## 2023-03-10 ENCOUNTER — Encounter (HOSPITAL_COMMUNITY): Payer: Self-pay

## 2023-03-10 ENCOUNTER — Telehealth: Payer: Self-pay | Admitting: Neurology

## 2023-03-10 MED ORDER — RIVASTIGMINE TARTRATE 3 MG PO CAPS: 3.0000 mg | ORAL_CAPSULE | Freq: Two times a day (BID) | ORAL | 3 refills | Status: DC

## 2023-03-10 MED ORDER — RIVASTIGMINE TARTRATE 3 MG PO CAPS: 3.0000 mg | ORAL_CAPSULE | Freq: Two times a day (BID) | ORAL | 0 refills | Status: DC

## 2023-03-10 NOTE — Telephone Encounter (Signed)
 Pt would like to discuss if Dr Teresa Coombs will put him back on rivastigmine (EXELON) 3 MG capsule .  Pt states for the entire time he was in a nursing home from Feb to Oct of last year he was on rivastigmine (EXELON) 3 MG capsule  the entire time, when released from the nursing home he was given a 5 day supply, he has not had this medication since then, pt  would like a call to discuss. Pt is aware of upcoming appointment with Dr Teresa Coombs.

## 2023-03-10 NOTE — Telephone Encounter (Signed)
 Yes

## 2023-03-10 NOTE — Telephone Encounter (Signed)
 Pt last saw Dr. Teresa Coombs 04/02/22. Next appt scheduled for 04/02/23.   Per last note, MD wrote: continue Exelon 3 mg twice daily   Looks like he last refilled 11/05/22 #60 written by Durward Parcel, FNP.

## 2023-03-10 NOTE — Telephone Encounter (Signed)
 Left voicemail med was refilled

## 2023-03-18 ENCOUNTER — Ambulatory Visit (HOSPITAL_COMMUNITY)
Admission: RE | Admit: 2023-03-18 | Discharge: 2023-03-18 | Disposition: A | Discharge status: Home / Self Care | Payer: 59 | Source: Ambulatory Visit | Attending: Nephrology | Admitting: Nephrology

## 2023-03-18 VITALS — BP 133/85 | HR 71 | Temp 97.3°F | Resp 17

## 2023-03-18 DIAGNOSIS — D638 Anemia in other chronic diseases classified elsewhere: Secondary | ICD-10-CM

## 2023-03-18 DIAGNOSIS — N184 Chronic kidney disease, stage 4 (severe): Secondary | ICD-10-CM | POA: Insufficient documentation

## 2023-03-18 DIAGNOSIS — D631 Anemia in chronic kidney disease: Secondary | ICD-10-CM | POA: Insufficient documentation

## 2023-03-18 LAB — IRON AND TIBC
Iron: 65 ug/dL (ref 45–182)
Saturation Ratios: 33 % (ref 17.9–39.5)
TIBC: 197 ug/dL — ABNORMAL LOW (ref 250–450)
UIBC: 132 ug/dL

## 2023-03-18 LAB — FERRITIN: Ferritin: 485 ng/mL — ABNORMAL HIGH (ref 24–336)

## 2023-03-18 LAB — POCT HEMOGLOBIN-HEMACUE: Hemoglobin: 10.3 g/dL — ABNORMAL LOW (ref 13.0–17.0)

## 2023-03-18 MED ORDER — EPOETIN ALFA-EPBX 10000 UNIT/ML IJ SOLN
20000.0000 [IU] | INTRAMUSCULAR | Status: DC
  Administered 2023-03-18: 20000 [IU] via SUBCUTANEOUS

## 2023-03-18 MED ORDER — EPOETIN ALFA-EPBX 10000 UNIT/ML IJ SOLN
INTRAMUSCULAR | Status: AC
Start: 2023-03-18 — End: 2023-03-18
  Filled 2023-03-18: qty 2

## 2023-03-30 ENCOUNTER — Other Ambulatory Visit: Payer: Self-pay

## 2023-03-30 MED ORDER — RIVASTIGMINE TARTRATE 3 MG PO CAPS: 3.0000 mg | ORAL_CAPSULE | Freq: Two times a day (BID) | ORAL | 3 refills | Status: DC

## 2023-04-02 ENCOUNTER — Ambulatory Visit: Payer: Medicare Other | Admitting: Neurology

## 2023-04-02 ENCOUNTER — Encounter: Payer: Self-pay | Admitting: Neurology

## 2023-04-02 ENCOUNTER — Telehealth: Payer: Self-pay | Admitting: Anesthesiology

## 2023-04-02 VITALS — BP 168/77 | HR 74 | Ht 67.0 in | Wt 192.5 lb

## 2023-04-02 DIAGNOSIS — G3184 Mild cognitive impairment, so stated: Secondary | ICD-10-CM

## 2023-04-02 NOTE — Telephone Encounter (Signed)
 Marland Kitchen

## 2023-04-02 NOTE — Patient Instructions (Addendum)
Continue current medications  Continue to follow up with your doctors  Return as needed
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
## 2023-04-09 ENCOUNTER — Telehealth: Payer: Self-pay | Admitting: Neurology

## 2023-04-09 NOTE — Telephone Encounter (Signed)
 Call to patient and advised that at any time a patient or family member calls in and reports and hallucinations or violent behaviors we encourage family members to remove any items that may be accessible.Also encourage to call 911 or take to 24 hr behavior health center.  Also advised that Dr. Teresa Coombs states to get sleep aid from PCP. Patient verbalized understanding.

## 2023-04-09 NOTE — Telephone Encounter (Signed)
 Pt asking if Dr. Teresa Coombs informed son I was not able to have a gun in the house, also asking for a prescribed medication sleep aide. Would like a call from the nurse.

## 2023-04-15 ENCOUNTER — Encounter (HOSPITAL_COMMUNITY)
Admission: RE | Admit: 2023-04-15 | Discharge: 2023-04-15 | Disposition: A | Discharge status: Home / Self Care | Source: Ambulatory Visit | Attending: Nephrology | Admitting: Nephrology

## 2023-04-15 VITALS — BP 126/69 | HR 83 | Temp 97.2°F | Resp 17

## 2023-04-15 DIAGNOSIS — D638 Anemia in other chronic diseases classified elsewhere: Secondary | ICD-10-CM

## 2023-04-15 DIAGNOSIS — N1832 Chronic kidney disease, stage 3b: Secondary | ICD-10-CM | POA: Diagnosis present

## 2023-04-15 DIAGNOSIS — D631 Anemia in chronic kidney disease: Secondary | ICD-10-CM | POA: Diagnosis not present

## 2023-04-15 LAB — FERRITIN: Ferritin: 653 ng/mL — ABNORMAL HIGH (ref 24–336)

## 2023-04-15 LAB — IRON AND TIBC
Iron: 37 ug/dL — ABNORMAL LOW (ref 45–182)
Saturation Ratios: 19 % (ref 17.9–39.5)
TIBC: 197 ug/dL — ABNORMAL LOW (ref 250–450)
UIBC: 160 ug/dL

## 2023-04-15 LAB — POCT HEMOGLOBIN-HEMACUE: Hemoglobin: 10.5 g/dL — ABNORMAL LOW (ref 13.0–17.0)

## 2023-04-15 MED ORDER — EPOETIN ALFA-EPBX 10000 UNIT/ML IJ SOLN
20000.0000 [IU] | INTRAMUSCULAR | Status: DC
  Administered 2023-04-15: 20000 [IU] via SUBCUTANEOUS

## 2023-04-15 MED ORDER — EPOETIN ALFA-EPBX 10000 UNIT/ML IJ SOLN
INTRAMUSCULAR | Status: AC
  Filled 2023-04-15: qty 2

## 2023-05-13 ENCOUNTER — Ambulatory Visit (HOSPITAL_COMMUNITY)
Admission: RE | Admit: 2023-05-13 | Discharge: 2023-05-13 | Disposition: A | Discharge status: Home / Self Care | Source: Ambulatory Visit | Attending: Nephrology | Admitting: Nephrology

## 2023-05-13 VITALS — BP 111/69 | HR 82 | Temp 97.3°F | Resp 17

## 2023-05-13 DIAGNOSIS — N1832 Chronic kidney disease, stage 3b: Secondary | ICD-10-CM | POA: Diagnosis present

## 2023-05-13 DIAGNOSIS — D638 Anemia in other chronic diseases classified elsewhere: Secondary | ICD-10-CM

## 2023-05-13 DIAGNOSIS — D631 Anemia in chronic kidney disease: Secondary | ICD-10-CM | POA: Diagnosis not present

## 2023-05-13 LAB — POCT HEMOGLOBIN-HEMACUE: Hemoglobin: 10.2 g/dL — ABNORMAL LOW (ref 13.0–17.0)

## 2023-05-13 LAB — IRON AND TIBC
Iron: 49 ug/dL (ref 45–182)
Saturation Ratios: 24 % (ref 17.9–39.5)
TIBC: 203 ug/dL — ABNORMAL LOW (ref 250–450)
UIBC: 154 ug/dL

## 2023-05-13 LAB — FERRITIN: Ferritin: 650 ng/mL — ABNORMAL HIGH (ref 24–336)

## 2023-05-13 MED ORDER — EPOETIN ALFA-EPBX 10000 UNIT/ML IJ SOLN
INTRAMUSCULAR | Status: AC
  Filled 2023-05-13: qty 2

## 2023-05-13 MED ORDER — EPOETIN ALFA-EPBX 10000 UNIT/ML IJ SOLN
20000.0000 [IU] | INTRAMUSCULAR | Status: DC
  Administered 2023-05-13: 20000 [IU] via SUBCUTANEOUS

## 2023-06-01 ENCOUNTER — Other Ambulatory Visit: Payer: Self-pay

## 2023-06-01 ENCOUNTER — Encounter: Payer: Self-pay | Admitting: *Deleted

## 2023-06-01 ENCOUNTER — Ambulatory Visit: Admission: EM | Admit: 2023-06-01 | Discharge: 2023-06-01 | Disposition: A | Discharge status: Home / Self Care

## 2023-06-01 DIAGNOSIS — L03115 Cellulitis of right lower limb: Secondary | ICD-10-CM

## 2023-06-01 DIAGNOSIS — N183 Chronic kidney disease, stage 3 unspecified: Secondary | ICD-10-CM | POA: Diagnosis not present

## 2023-06-01 DIAGNOSIS — E1122 Type 2 diabetes mellitus with diabetic chronic kidney disease: Secondary | ICD-10-CM

## 2023-06-01 MED ORDER — DOXYCYCLINE HYCLATE 100 MG PO CAPS: 100.0000 mg | ORAL_CAPSULE | Freq: Two times a day (BID) | ORAL | 0 refills | Status: AC

## 2023-06-01 NOTE — ED Triage Notes (Signed)
 Pt has skin tear on right lower leg from "having thin skin". Leg is sore and swollen. Hx of diabetes but states he is not on any medications since he lost weight. Right lower leg has reddened area with scab and dime size open wound. States he was on antibiotics in April for a "fever"

## 2023-06-01 NOTE — Discharge Instructions (Signed)
  Continue to monitor wound and leg for improvement and healing- if no gradual improvement- please follow up with primary care or at urgent care/ emergency room with worsening.

## 2023-06-01 NOTE — ED Provider Notes (Addendum)
 [facility]    CSN: [id number] Arrival date & time: 06/01/23  1137      History   Chief Complaint Chief Complaint  Patient presents with   Wound Check    HPI Gerald [name] is a 78 y.o. male.   Patient here today for evaluation of skin tear to his right lower leg with surrounding erythema and swelling.  He reports that he first noticed this several days ago and believes the skin tear occurred from a bandage he was using to cover another wound.  He reports that he has history of diabetes but since losing weight has not required treatment for same.  He notes he was on antibiotics last month for "fever".  He denies any recent fever.  He does not report any numbness or tingling.  The history is provided by the patient.  Wound Check    Past Medical History:  Diagnosis Date   Abnormality of gait 10/05/2014   Alzheimer disease ([name])    Anemia    Anxiety    Bilateral edema of lower extremity    LE Dopplers 4/22 - no thrombus or thrombophelbitis, no reflux   Blood transfusion without reported diagnosis    with surgery 2012   Cataract    removed both eyes   CHF (congestive heart failure) ([name])    Chronic kidney disease    Colon polyp    DDD (degenerative disc disease)    Depression    DJD (degenerative joint disease)    DOE (dyspnea on exertion)    Echo 04 /22/2014 - normal EF, mild LVH, grade 1 Diastolic Dysfunction; Lexiscan  Myoview 04/28/12 - no ishcemia or infarction, LBBB septal wall motion; EF ~60%   Fatigue    Fatigue    pt states he gets tired very quickly   GERD (gastroesophageal reflux disease)    H/O iron deficiency anemia    with  hemmorrhoids   Hyperlipidemia    Hypertension    Left bundle branch block     chronic, negative Myoview a normal echo as noted above    Osteoarthritis    with bilateral hip surgeries and multiple complication, with the most recent  surgery in January 2012   Scrotal edema    Type 2 diabetes mellitus without  complications ([name]) 01/04/2020   Vitamin D  deficiency     Patient Active Problem List   Diagnosis Date Noted   Acute cystitis 02/14/2022   Hypoglycemia 02/14/2022   Acute urinary retention 09/16/2021   Alzheimer's dementia with behavioral disturbance ([name]) 09/14/2021   Prolonged QT interval 09/14/2021   Acute metabolic encephalopathy 09/14/2021   AKI (acute kidney injury) ([name])    Acute renal failure 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)    Acute encephalopathy 09/13/2021   Anemia of chronic renal failure 06/26/2021   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   Posterior vitreous detachment of both eyes 12/24/2020   Anemia of chronic disease 11/07/2020   Recurrent falls 11/07/2020   Pseudophakia of both eyes 10/16/2020   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 ([name]) 10/16/2020   Macular pucker, right eye 10/16/2020   Mild nonproliferative diabetic retinopathy of left eye ([name]) 10/16/2020   Anxiety state 01/04/2020   Benign prostatic hyperplasia without lower urinary tract symptoms 01/04/2020   Chronic diastolic CHF (congestive heart failure) ([name]) 01/04/2020   Diabetic renal disease ([name]) 01/04/2020   Diabetic retinopathy ([name]) 01/04/2020  ED (erectile dysfunction) of organic origin 01/04/2020   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/04/2020   Insomnia 01/04/2020   Left bundle branch block 01/04/2020   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   Primary localized osteoarthritis of pelvic region and thigh 01/04/2020   Stage 3 chronic kidney disease ([name]) 01/04/2020   Vitamin B12 deficiency (non anemic) 01/04/2020   Recurrent major depression ([name]) 01/04/2020   Severe episode of recurrent major depressive disorder, without psychotic features ([name]) 10/05/2018   Cognitive complaints with normal neuropsychological exam 03/11/2018   Referred otalgia of left ear 09/23/2016    Temporomandibular joint (TMJ) pain 09/23/2016   Cervicogenic headache 09/03/2016   Obstructive sleep apnea syndrome 11/15/2015   Memory difficulties 10/05/2014   Abnormality of gait 10/05/2014   Intermittent explosive disorder 09/25/2014   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   Encounter for long-term (current) use of other medications 04/28/2013   Vitamin D  deficiency 04/28/2013   Sleep disorder -Epworth OSA scale = 15 06/02/2012   Anal fissure 08/09/2010   Septic arthritis of hip ([name]) 04/11/2010   HYPERLIPIDEMIA 01/11/2010   DEPRESSION 01/11/2010   Essential hypertension 01/11/2010   GERD (gastroesophageal reflux disease) 01/11/2010   Depression 01/11/2010   Hyperlipidemia 01/11/2010   DM2 (diabetes mellitus, type 2) ([name]) 09/21/2008   ANEMIA, IRON DEFICIENCY 09/21/2008   History of colonic polyps 09/21/2008   Anemia, iron deficiency 09/21/2008    Past Surgical History:  Procedure Laterality Date   Cardiology Nuclear Med Study   04/29/2012   overall impression ; NORMAL  STRESS  NUCLEAR STUDY   COLONOSCOPY  01/28/2010   HIP SURGERY Right 01/06/2002   replacement   HIP SURGERY Left 02/2010   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, had necrosis, 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 left leg   ROTATOR CUFF REPAIR Left    UPPER GASTROINTESTINAL ENDOSCOPY     dilation   Venous Duplex  04/27/2012   LOWER EXTREMITY SWELLING -Impressions Collin Deal ; This is a normal bilateral  lower extremity venous duplex Doppler evaluation       Home Medications    Prior to Admission medications   Medication Sig Start Date End Date Taking? Authorizing Provider  amLODipine  (NORVASC ) 10 MG tablet Take 10 mg by mouth in the morning.   Yes [provider]  cholecalciferol  (VITAMIN D3) 25 MCG (1000 UNIT) tablet Take 5,000 Units by mouth daily.   Yes [provider]  cyanocobalamin  (VITAMIN B12) 1000 MCG tablet Take 1,000 mcg by mouth daily.   Yes  [provider]  doxycycline  (VIBRAMYCIN ) 100 MG capsule Take 1 capsule (100 mg total) by mouth 2 (two) times daily for 7 days. 06/01/23 06/08/23 Yes Vernestine Gondola, PA-C  finasteride  (PROSCAR ) 5 MG tablet Take 5 mg by mouth daily. 10/02/21  Yes [provider]  memantine  (NAMENDA ) 10 MG tablet Take 1 tablet (10 mg total) by mouth 2 (two) times daily. 07/25/21 04/01/24 Yes Camara, Nancye Azure, MD  omeprazole  (PRILOSEC) 40 MG capsule Take 1 capsule (40 mg total) by mouth daily. 01/12/13  Yes Vangie Genet, MD  potassium chloride  (KLOR-CON ) 20 MEQ packet Take 20 mEq by mouth 2 (two) times daily. 03/02/23  Yes [provider]  QUEtiapine  (SEROQUEL ) 25 MG tablet Take 0.5 tablets (12.5 mg total) by mouth in the morning. 02/19/22  Yes Rai, Ripudeep K, MD  rivastigmine  (EXELON ) 3 MG capsule Take 1 capsule (3 mg total) by mouth 2 (two) times daily. 03/30/23 03/24/24 Yes Camara, Amadou, MD  atorvastatin  (  LIPITOR) 10 MG tablet Take 10 mg by mouth at bedtime.   Yes [provider]  doxazosin  (CARDURA ) 8 MG tablet Take 1 tablet (8 mg total) by mouth at bedtime. 09/16/21  Yes Audria Leather, MD  ferrous sulfate  325 (65 FE) MG tablet Take 325 mg by mouth daily with breakfast.   Yes [provider]  furosemide  (LASIX ) 40 MG tablet Take 40 mg by mouth daily. PM   Yes [provider]  furosemide  (LASIX ) 80 MG tablet Take 80 mg by mouth daily. AM   Yes [provider]  Melatonin 5 MG SUBL Place 5 mg under the tongue at bedtime as needed (sleep).     [provider]  Multiple Vitamins-Minerals (CENTRUM SILVER 50+MEN) TABS Take 1 tablet by mouth daily with breakfast.   Yes [provider]  polyethylene glycol (MIRALAX  / GLYCOLAX ) 17 g packet Take 17 g by mouth daily as needed for moderate constipation. Patient taking differently: Take 17 g by mouth See admin instructions. Three times weekly 09/16/21  Yes Audria Leather, MD  senna-docusate (SENOKOT-S)  8.6-50 MG tablet Take 1 tablet by mouth 2 (two) times daily. Patient taking differently: Take 1 tablet by mouth daily. 09/16/21   Audria Leather, MD    Family History Family History  Problem Relation Age of Onset   Dementia Mother    Hypertension Mother    Heart disease Mother    Diabetes Mother    Heart attack Maternal Grandmother    Diabetes Maternal Grandfather    Colon cancer Neg Hx    Colon polyps Neg Hx    Esophageal cancer Neg Hx    Rectal cancer Neg Hx    Stomach cancer Neg Hx     Social History Social History   Tobacco Use   Smoking status: Former    Current packs/day: 0.00    Average packs/day: 0.5 packs/day for 30.0 years (15.0 ttl pk-yrs)    Types: Cigarettes    Start date: 03/30/1943    Quit date: 03/29/1973    Years since quitting: 50.2   Smokeless tobacco: Never  Vaping Use   Vaping status: Never Used  Substance Use Topics   Alcohol use: Yes    Alcohol/week: 1.0 standard drink of alcohol    Types: 1 Standard drinks or equivalent per week    Comment: couple times a year   Drug use: No     Allergies   Patient has no known allergies.   Review of Systems Review of Systems  Constitutional:  Negative for chills and fever.  Eyes:  Negative for discharge and redness.  Skin:  Positive for color change and wound.  Neurological:  Negative for numbness.     Physical Exam Triage Vital Signs ED Triage Vitals  Encounter Vitals Group     BP 06/01/23 1217 (!) 126/56     Systolic BP Percentile --      Diastolic BP Percentile --      Pulse Rate 06/01/23 1217 81     Resp 06/01/23 1217 16     Temp 06/01/23 1217 98.5 F (36.9 C)     Temp Source 06/01/23 1217 Oral     SpO2 06/01/23 1217 96 %     Weight --      Height --      Head Circumference --      Peak Flow --      Pain Score 06/01/23 1209 2     Pain Loc --  Pain Education --      Exclude from Growth Chart --    No data found.  Updated Vital Signs BP (!) 126/56 (BP Location: Left Arm)    Pulse 81   Temp 98.5 F (36.9 C) (Oral)   Resp 16   SpO2 96%   Visual Acuity Right Eye Distance:   Left Eye Distance:   Bilateral Distance:    Right Eye Near:   Left Eye Near:    Bilateral Near:     Physical Exam Vitals and nursing note reviewed.  Constitutional:      General: He is not in acute distress.    Appearance: Normal appearance. He is not ill-appearing.  HENT:     Head: Normocephalic and atraumatic.  Eyes:     Conjunctiva/sclera: Conjunctivae normal.  Cardiovascular:     Rate and Rhythm: Normal rate.  Pulmonary:     Effort: Pulmonary effort is normal. No respiratory distress.  Skin:    Comments: 2 skin tears noted to right anterior lower leg with surrounding erythema diffusely to anterior lower leg with mild associated swelling, no active drainage or bleeding  Neurological:     Mental Status: He is alert.  Psychiatric:        Mood and Affect: Mood normal.        Behavior: Behavior normal.        Thought Content: Thought content normal.      UC Treatments / Results  Labs (all labs ordered are listed, but only abnormal results are displayed) Labs Reviewed - No data to display  EKG   Radiology No results found.  Procedures Procedures (including critical care time)  Medications Ordered in UC Medications - No data to display  Initial Impression / Assessment and Plan / UC Course  I have reviewed the triage vital signs and the nursing notes.  Pertinent labs & imaging results that were available during my care of the patient were reviewed by me and considered in my medical decision making (see chart for details).     Will treat to cover cellulitis and advised follow-up if no gradual improvement or with any further concerns.  Discussed keeping area clean and avoiding use of peroxide which patient has been using.  Discussed use of antibiotic ointment and wound was dressed in office to avoid adhesive in order to prevent further skin tears. Patient  reports diabetes is well controlled with medication at this time.   Final Clinical Impressions(s) / UC Diagnoses   Final diagnoses:  Cellulitis of right lower extremity  Type 2 diabetes mellitus with stage 3 chronic kidney disease, without long-term current use of insulin , unspecified whether stage 3a or 3b CKD ([name])     Discharge Instructions       Continue to monitor wound and leg for improvement and healing- if no gradual improvement- please follow up with primary care or at urgent care/ emergency room with worsening.      ED Prescriptions     Medication Sig Dispense Auth. Provider   doxycycline  (VIBRAMYCIN ) 100 MG capsule Take 1 capsule (100 mg total) by mouth 2 (two) times daily for 7 days. 14 capsule Vernestine Gondola, PA-C      PDMP not reviewed this encounter.   Vernestine Gondola, PA-C 06/01/23 1403    Vernestine Gondola, PA-C 06/01/23 [phone]

## 2023-06-10 ENCOUNTER — Ambulatory Visit (HOSPITAL_COMMUNITY)
Admission: RE | Admit: 2023-06-10 | Discharge: 2023-06-10 | Disposition: A | Discharge status: Home / Self Care | Source: Ambulatory Visit | Attending: Nephrology | Admitting: Nephrology

## 2023-06-10 VITALS — BP 133/63 | HR 83 | Temp 97.2°F | Resp 16

## 2023-06-10 DIAGNOSIS — Z79890 Hormone replacement therapy (postmenopausal): Secondary | ICD-10-CM | POA: Diagnosis not present

## 2023-06-10 DIAGNOSIS — N183 Chronic kidney disease, stage 3 unspecified: Secondary | ICD-10-CM | POA: Diagnosis present

## 2023-06-10 DIAGNOSIS — D631 Anemia in chronic kidney disease: Secondary | ICD-10-CM | POA: Diagnosis present

## 2023-06-10 DIAGNOSIS — D638 Anemia in other chronic diseases classified elsewhere: Secondary | ICD-10-CM | POA: Diagnosis present

## 2023-06-10 DIAGNOSIS — N1832 Chronic kidney disease, stage 3b: Secondary | ICD-10-CM | POA: Insufficient documentation

## 2023-06-10 LAB — IRON AND TIBC
Iron: 62 ug/dL (ref 45–182)
Saturation Ratios: 34 % (ref 17.9–39.5)
TIBC: 183 ug/dL — ABNORMAL LOW (ref 250–450)
UIBC: 121 ug/dL

## 2023-06-10 LAB — POCT HEMOGLOBIN-HEMACUE: Hemoglobin: 10 g/dL — ABNORMAL LOW (ref 13.0–17.0)

## 2023-06-10 LAB — FERRITIN: Ferritin: 570 ng/mL — ABNORMAL HIGH (ref 24–336)

## 2023-06-10 MED ORDER — EPOETIN ALFA-EPBX 10000 UNIT/ML IJ SOLN
20000.0000 [IU] | INTRAMUSCULAR | Status: DC
  Administered 2023-06-10: 20000 [IU] via SUBCUTANEOUS

## 2023-06-10 MED ORDER — EPOETIN ALFA-EPBX 10000 UNIT/ML IJ SOLN
INTRAMUSCULAR | Status: AC
  Filled 2023-06-10: qty 2

## 2023-06-29 ENCOUNTER — Telehealth: Payer: Self-pay | Admitting: Pharmacy Technician

## 2023-06-29 NOTE — Telephone Encounter (Signed)
 Auth Submission: NO AUTH NEEDED Site of care: Site of care: [facility] Payer: [employer] DUAL Medication & CPT/J Code(s) submitted: Q5105 RETACRIT  Diagnosis Code:  Route of submission (phone, fax, portal): PORTAL Phone # Fax # Auth type: Buy/Bill HB Units/visits requested: 20000u q 28 days Reference number: [id number] Approval from: 06/29/23 to 01/06/24    Gerald [name]

## 2023-07-08 ENCOUNTER — Encounter (HOSPITAL_COMMUNITY)
Admission: RE | Admit: 2023-07-08 | Discharge: 2023-07-08 | Disposition: A | Discharge status: Home / Self Care | Source: Ambulatory Visit | Attending: Nephrology | Admitting: Nephrology

## 2023-07-08 VITALS — BP 126/63 | HR 86 | Temp 97.9°F | Resp 16

## 2023-07-08 DIAGNOSIS — D638 Anemia in other chronic diseases classified elsewhere: Secondary | ICD-10-CM

## 2023-07-08 DIAGNOSIS — N183 Chronic kidney disease, stage 3 unspecified: Secondary | ICD-10-CM | POA: Diagnosis present

## 2023-07-08 DIAGNOSIS — Z79890 Hormone replacement therapy (postmenopausal): Secondary | ICD-10-CM | POA: Diagnosis not present

## 2023-07-08 DIAGNOSIS — D631 Anemia in chronic kidney disease: Secondary | ICD-10-CM | POA: Diagnosis not present

## 2023-07-08 DIAGNOSIS — N1832 Chronic kidney disease, stage 3b: Secondary | ICD-10-CM | POA: Diagnosis not present

## 2023-07-08 LAB — POCT HEMOGLOBIN-HEMACUE: Hemoglobin: 10.1 g/dL — ABNORMAL LOW (ref 13.0–17.0)

## 2023-07-08 LAB — IRON AND TIBC
Iron: 55 ug/dL (ref 45–182)
Saturation Ratios: 26 % (ref 17.9–39.5)
TIBC: 211 ug/dL — ABNORMAL LOW (ref 250–450)
UIBC: 156 ug/dL

## 2023-07-08 LAB — FERRITIN: Ferritin: 443 ng/mL — ABNORMAL HIGH (ref 24–336)

## 2023-07-08 MED ORDER — EPOETIN ALFA-EPBX 10000 UNIT/ML IJ SOLN
INTRAMUSCULAR | Status: AC
  Filled 2023-07-08: qty 2

## 2023-07-08 MED ORDER — EPOETIN ALFA-EPBX 10000 UNIT/ML IJ SOLN
20000.0000 [IU] | INTRAMUSCULAR | Status: DC
  Administered 2023-07-08: 20000 [IU] via SUBCUTANEOUS

## 2023-08-05 ENCOUNTER — Encounter (HOSPITAL_COMMUNITY)
Admission: RE | Admit: 2023-08-05 | Discharge: 2023-08-05 | Disposition: A | Discharge status: Home / Self Care | Source: Ambulatory Visit | Attending: Nephrology | Admitting: Nephrology

## 2023-08-05 VITALS — BP 135/68 | HR 93 | Temp 97.6°F | Resp 16

## 2023-08-05 DIAGNOSIS — N1832 Chronic kidney disease, stage 3b: Secondary | ICD-10-CM | POA: Diagnosis not present

## 2023-08-05 DIAGNOSIS — D638 Anemia in other chronic diseases classified elsewhere: Secondary | ICD-10-CM

## 2023-08-05 DIAGNOSIS — N183 Chronic kidney disease, stage 3 unspecified: Secondary | ICD-10-CM | POA: Diagnosis not present

## 2023-08-05 LAB — POCT HEMOGLOBIN-HEMACUE: Hemoglobin: 10.4 g/dL — ABNORMAL LOW (ref 13.0–17.0)

## 2023-08-05 MED ORDER — EPOETIN ALFA-EPBX 10000 UNIT/ML IJ SOLN
INTRAMUSCULAR | Status: AC
  Filled 2023-08-05: qty 2

## 2023-08-05 MED ORDER — EPOETIN ALFA-EPBX 10000 UNIT/ML IJ SOLN
20000.0000 [IU] | INTRAMUSCULAR | Status: DC
  Administered 2023-08-05: 20000 [IU] via SUBCUTANEOUS

## 2023-09-02 ENCOUNTER — Ambulatory Visit (HOSPITAL_COMMUNITY)
Admission: RE | Admit: 2023-09-02 | Discharge: 2023-09-02 | Disposition: A | Discharge status: Home / Self Care | Source: Ambulatory Visit | Attending: Nephrology | Admitting: Nephrology

## 2023-09-02 VITALS — BP 142/65 | HR 83 | Temp 97.1°F | Resp 17

## 2023-09-02 DIAGNOSIS — D638 Anemia in other chronic diseases classified elsewhere: Secondary | ICD-10-CM | POA: Diagnosis present

## 2023-09-02 DIAGNOSIS — D631 Anemia in chronic kidney disease: Secondary | ICD-10-CM | POA: Diagnosis present

## 2023-09-02 DIAGNOSIS — N1832 Chronic kidney disease, stage 3b: Secondary | ICD-10-CM | POA: Diagnosis present

## 2023-09-02 LAB — IRON AND TIBC
Iron: 67 ug/dL (ref 45–182)
Saturation Ratios: 34 % (ref 17.9–39.5)
TIBC: 199 ug/dL — ABNORMAL LOW (ref 250–450)
UIBC: 132 ug/dL

## 2023-09-02 LAB — POCT HEMOGLOBIN-HEMACUE: Hemoglobin: 10.1 g/dL — ABNORMAL LOW (ref 13.0–17.0)

## 2023-09-02 LAB — FERRITIN: Ferritin: 441 ng/mL — ABNORMAL HIGH (ref 24–336)

## 2023-09-02 MED ORDER — EPOETIN ALFA-EPBX 10000 UNIT/ML IJ SOLN
INTRAMUSCULAR | Status: AC
  Filled 2023-09-02: qty 2

## 2023-09-02 MED ORDER — EPOETIN ALFA-EPBX 10000 UNIT/ML IJ SOLN
20000.0000 [IU] | INTRAMUSCULAR | Status: DC
  Administered 2023-09-02: 20000 [IU] via SUBCUTANEOUS

## 2023-09-17 DIAGNOSIS — D631 Anemia in chronic kidney disease: Secondary | ICD-10-CM | POA: Insufficient documentation

## 2023-09-30 ENCOUNTER — Ambulatory Visit (HOSPITAL_COMMUNITY)
Admission: RE | Admit: 2023-09-30 | Discharge: 2023-09-30 | Disposition: A | Discharge status: Home / Self Care | Source: Ambulatory Visit | Attending: Nephrology | Admitting: Nephrology

## 2023-09-30 VITALS — BP 151/77 | HR 81 | Temp 97.1°F | Resp 17

## 2023-09-30 DIAGNOSIS — D631 Anemia in chronic kidney disease: Secondary | ICD-10-CM | POA: Diagnosis present

## 2023-09-30 DIAGNOSIS — N183 Chronic kidney disease, stage 3 unspecified: Secondary | ICD-10-CM | POA: Diagnosis present

## 2023-09-30 DIAGNOSIS — Z79890 Hormone replacement therapy (postmenopausal): Secondary | ICD-10-CM | POA: Diagnosis not present

## 2023-09-30 DIAGNOSIS — N1832 Chronic kidney disease, stage 3b: Secondary | ICD-10-CM | POA: Diagnosis not present

## 2023-09-30 LAB — IRON AND TIBC
Iron: 67 ug/dL (ref 45–182)
Saturation Ratios: 33 % (ref 17.9–39.5)
TIBC: 204 ug/dL — ABNORMAL LOW (ref 250–450)
UIBC: 137 ug/dL

## 2023-09-30 LAB — FERRITIN: Ferritin: 437 ng/mL — ABNORMAL HIGH (ref 24–336)

## 2023-09-30 LAB — POCT HEMOGLOBIN-HEMACUE: Hemoglobin: 10.3 g/dL — ABNORMAL LOW (ref 13.0–17.0)

## 2023-09-30 MED ORDER — EPOETIN ALFA-EPBX 20000 UNIT/ML IJ SOLN
INTRAMUSCULAR | Status: AC
  Filled 2023-09-30: qty 1

## 2023-09-30 MED ORDER — EPOETIN ALFA-EPBX 20000 UNIT/ML IJ SOLN
20000.0000 [IU] | Freq: Once | INTRAMUSCULAR | Status: AC
  Administered 2023-09-30: 20000 [IU] via SUBCUTANEOUS

## 2023-10-28 ENCOUNTER — Encounter (HOSPITAL_COMMUNITY)

## 2023-11-03 ENCOUNTER — Ambulatory Visit (HOSPITAL_COMMUNITY)
Admission: RE | Admit: 2023-11-03 | Discharge: 2023-11-03 | Disposition: A | Discharge status: Home / Self Care | Source: Ambulatory Visit | Attending: Nephrology | Admitting: Nephrology

## 2023-11-03 VITALS — BP 157/65 | HR 87 | Temp 97.8°F | Resp 18

## 2023-11-03 DIAGNOSIS — Z79890 Hormone replacement therapy (postmenopausal): Secondary | ICD-10-CM | POA: Insufficient documentation

## 2023-11-03 DIAGNOSIS — N183 Chronic kidney disease, stage 3 unspecified: Secondary | ICD-10-CM | POA: Insufficient documentation

## 2023-11-03 DIAGNOSIS — D631 Anemia in chronic kidney disease: Secondary | ICD-10-CM | POA: Insufficient documentation

## 2023-11-03 DIAGNOSIS — N1832 Chronic kidney disease, stage 3b: Secondary | ICD-10-CM | POA: Insufficient documentation

## 2023-11-03 LAB — POCT HEMOGLOBIN-HEMACUE: Hemoglobin: 10 g/dL — ABNORMAL LOW (ref 13.0–17.0)

## 2023-11-03 LAB — IRON AND TIBC
Iron: 63 ug/dL (ref 45–182)
Saturation Ratios: 29 % (ref 17.9–39.5)
TIBC: 216 ug/dL — ABNORMAL LOW (ref 250–450)
UIBC: 153 ug/dL

## 2023-11-03 LAB — FERRITIN: Ferritin: 484 ng/mL — ABNORMAL HIGH (ref 24–336)

## 2023-11-03 MED ORDER — EPOETIN ALFA-EPBX 20000 UNIT/ML IJ SOLN
20000.0000 [IU] | Freq: Once | INTRAMUSCULAR | Status: AC
  Administered 2023-11-03: 20000 [IU] via SUBCUTANEOUS

## 2023-11-03 MED ORDER — EPOETIN ALFA-EPBX 20000 UNIT/ML IJ SOLN
INTRAMUSCULAR | Status: AC
  Filled 2023-11-03: qty 1

## 2023-11-04 ENCOUNTER — Encounter (HOSPITAL_COMMUNITY)

## 2023-11-30 ENCOUNTER — Encounter (HOSPITAL_BASED_OUTPATIENT_CLINIC_OR_DEPARTMENT_OTHER): Payer: Self-pay

## 2023-11-30 NOTE — Progress Notes (Unsigned)
 Cardiology Office Note Date:  12/01/2023  ID:  Gerald [name], DOB 1945-04-28, MRN [id number] PCP:  [name], DO  Cardiologist:   [name], MD  Chief Complaint  Patient presents with   Chest Pain      Problems CP/SOB/LE edema for 3 months CAC on CT LBBB CKD4 c/b anemia DM 6.3  HFpEF Cardiomegaly on X-ray TTE 04/2014- 55%, mod basal hypertrophy,  M: AE10, AN10, FE120,   Visits  11/25: CBC, BMP, BNP, CCTA, TTE, LP, ASA81, XL25, SLN     History of Present Illness: Gerald [name] is a 78 y.o. male who presents for new visit.   Is presenting with 3 months of worsening chest pain and dyspnea with exertion.  He has been getting worse to the point where he gets short of breath walking from the bathroom to his bedroom.  He stopped going to the gyms about 6 months ago because of worsening dyspnea.  He denies any orthopnea, PND, but does have some lower extremity edema worse on the left.  He quit tobacco during the 1970s and denies any pertinent family history of heart disease.  He does report 30 pound weight gain over the last 6 months.    ROS: Please see the history of present illness. All other systems are reviewed and negative.   Past Medical History:  Diagnosis Date   Abnormality of gait 10/05/2014   Alzheimer disease ([name])    Anemia    Anxiety    Bilateral edema of lower extremity    LE Dopplers 4/22 - no thrombus or thrombophelbitis, no reflux   Blood transfusion without reported diagnosis    with surgery 2012   Cataract    removed both eyes   CHF (congestive heart failure) ([name])    Chronic kidney disease    Colon polyp    DDD (degenerative disc disease)    Depression    DJD (degenerative joint disease)    DOE (dyspnea on exertion)    Echo 04 /22/2014 - normal EF, mild LVH, grade 1 Diastolic Dysfunction; Lexiscan  Myoview 04/28/12 - no ishcemia or infarction, LBBB septal wall motion; EF ~60%   Fatigue    Fatigue    pt states he gets tired  very quickly   GERD (gastroesophageal reflux disease)    H/O iron deficiency anemia    with  hemmorrhoids   Hyperlipidemia    Hypertension    Left bundle branch block     chronic, negative Myoview a normal echo as noted above    Osteoarthritis    with bilateral hip surgeries and multiple complication, with the most recent  surgery in January 2012   Scrotal edema    Type 2 diabetes mellitus without complications ([name]) 01/04/2020   Vitamin D  deficiency     Past Surgical History:  Procedure Laterality Date   Cardiology Nuclear Med Study   04/29/2012   overall impression ; NORMAL  STRESS  NUCLEAR STUDY   COLONOSCOPY  01/28/2010   HIP SURGERY Right 01/06/2002   replacement   HIP SURGERY Left 02/2010   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, had necrosis, 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 left leg   ROTATOR CUFF REPAIR Left    UPPER GASTROINTESTINAL ENDOSCOPY     dilation   Venous Duplex  04/27/2012   LOWER EXTREMITY SWELLING -Impressions lindon ; This is a normal bilateral  lower extremity venous duplex Doppler evaluation    Current Outpatient Medications  Medication Sig Dispense Refill  amLODipine  (NORVASC ) 10 MG tablet Take 10 mg by mouth in the morning.     atorvastatin  (LIPITOR) 10 MG tablet Take 10 mg by mouth at bedtime.     cholecalciferol  (VITAMIN D3) 25 MCG (1000 UNIT) tablet Take 5,000 Units by mouth daily.     cyanocobalamin  (VITAMIN B12) 1000 MCG tablet Take 1,000 mcg by mouth daily.     doxazosin  (CARDURA ) 8 MG tablet Take 1 tablet (8 mg total) by mouth at bedtime.     ferrous sulfate  325 (65 FE) MG tablet Take 325 mg by mouth daily with breakfast.     finasteride  (PROSCAR ) 5 MG tablet Take 5 mg by mouth daily.     furosemide  (LASIX ) 40 MG tablet Take 40 mg by mouth daily. PM     furosemide  (LASIX ) 80 MG tablet Take 80 mg by mouth daily. AM     Melatonin 5 MG SUBL Place 5 mg under the tongue at bedtime as needed (sleep).      memantine  (NAMENDA ) 10 MG tablet Take 1 tablet (10 mg  total) by mouth 2 (two) times daily. 180 tablet 3   Multiple Vitamins-Minerals (CENTRUM SILVER 50+MEN) TABS Take 1 tablet by mouth daily with breakfast.     omeprazole  (PRILOSEC) 40 MG capsule Take 1 capsule (40 mg total) by mouth daily. 90 capsule 1   polyethylene glycol (MIRALAX  / GLYCOLAX ) 17 g packet Take 17 g by mouth daily as needed for moderate constipation. (Patient taking differently: Take 17 g by mouth See admin instructions. Three times weekly) 14 each 0   potassium chloride  (KLOR-CON ) 20 MEQ packet Take 20 mEq by mouth 2 (two) times daily.     QUEtiapine  (SEROQUEL ) 25 MG tablet Take 0.5 tablets (12.5 mg total) by mouth in the morning. 30 tablet 3   rivastigmine  (EXELON ) 3 MG capsule Take 1 capsule (3 mg total) by mouth 2 (two) times daily. 180 capsule 3   senna-docusate (SENOKOT-S) 8.6-50 MG tablet Take 1 tablet by mouth 2 (two) times daily. (Patient taking differently: Take 1 tablet by mouth daily.) 20 tablet 0   No current facility-administered medications for this visit.    Allergies:   Patient has no known allergies.   Social History:  see above  Family History:  see above  PHYSICAL EXAM: VS:  BP 130/62 (BP Location: Left Arm, Patient Position: Sitting, Cuff Size: Large)   Pulse 88   Ht 5' 7 (1.702 m)   Wt 197 lb (89.4 kg)   SpO2 97%   BMI 30.85 kg/m  , BMI Body mass index is 30.85 kg/m. GEN: Well nourished, well developed, in no acute distress HEENT: normal Neck: no JVD, carotid bruits, or masses Cardiac: RRR; no murmurs, rubs, or gallops,no edema  Respiratory:  CTAB bilaterally, normal work of breathing GI: soft, nontender, nondistended, + BS Extremities: No LE edema Skin: warm and dry, no rash Neuro:  Strength and sensation are intact  EKG: LBBB  Recent Labs: Reviewed  Studies: Reviewed  ASSESSMENT AND PLAN: Gerald [name] is a 78 y.o. male who presents for new visit.  - He is presenting for worsening chest pain and dyspnea with exertion concerning  for stable angina versus heart failure.  We will obtain some labs, including BNP.  Will also order coronary CTA and 2D echocardiogram. - Will start aspirin  81, metoprolol  25 daily, and sublingual nitro as needed for chest pain. - Will see him back in a month.   Signed, [name], MD  12/01/2023 10:07 AM    [facility]

## 2023-12-01 ENCOUNTER — Ambulatory Visit (HOSPITAL_COMMUNITY)
Admission: RE | Admit: 2023-12-01 | Discharge: 2023-12-01 | Disposition: A | Discharge status: Home / Self Care | Source: Ambulatory Visit | Attending: Nephrology | Admitting: Nephrology

## 2023-12-01 ENCOUNTER — Ambulatory Visit

## 2023-12-01 ENCOUNTER — Other Ambulatory Visit: Payer: Self-pay

## 2023-12-01 VITALS — BP 124/65 | HR 83 | Temp 97.3°F | Resp 18

## 2023-12-01 VITALS — BP 130/62 | HR 88 | Ht 67.0 in | Wt 197.0 lb

## 2023-12-01 DIAGNOSIS — E1122 Type 2 diabetes mellitus with diabetic chronic kidney disease: Secondary | ICD-10-CM | POA: Diagnosis not present

## 2023-12-01 DIAGNOSIS — N184 Chronic kidney disease, stage 4 (severe): Secondary | ICD-10-CM

## 2023-12-01 DIAGNOSIS — R079 Chest pain, unspecified: Secondary | ICD-10-CM | POA: Diagnosis not present

## 2023-12-01 DIAGNOSIS — I447 Left bundle-branch block, unspecified: Secondary | ICD-10-CM

## 2023-12-01 DIAGNOSIS — D631 Anemia in chronic kidney disease: Secondary | ICD-10-CM | POA: Insufficient documentation

## 2023-12-01 DIAGNOSIS — R072 Precordial pain: Secondary | ICD-10-CM

## 2023-12-01 DIAGNOSIS — I5032 Chronic diastolic (congestive) heart failure: Secondary | ICD-10-CM | POA: Diagnosis not present

## 2023-12-01 DIAGNOSIS — N1832 Chronic kidney disease, stage 3b: Secondary | ICD-10-CM | POA: Insufficient documentation

## 2023-12-01 DIAGNOSIS — N183 Chronic kidney disease, stage 3 unspecified: Secondary | ICD-10-CM | POA: Insufficient documentation

## 2023-12-01 DIAGNOSIS — R0602 Shortness of breath: Secondary | ICD-10-CM

## 2023-12-01 DIAGNOSIS — Z79890 Hormone replacement therapy (postmenopausal): Secondary | ICD-10-CM | POA: Diagnosis not present

## 2023-12-01 LAB — IRON AND TIBC
Iron: 50 ug/dL (ref 45–182)
Saturation Ratios: 25 % (ref 17.9–39.5)
TIBC: 200 ug/dL — ABNORMAL LOW (ref 250–450)
UIBC: 150 ug/dL

## 2023-12-01 LAB — POCT HEMOGLOBIN-HEMACUE: Hemoglobin: 10.2 g/dL — ABNORMAL LOW (ref 13.0–17.0)

## 2023-12-01 LAB — FERRITIN: Ferritin: 444 ng/mL — ABNORMAL HIGH (ref 24–336)

## 2023-12-01 MED ORDER — EPOETIN ALFA-EPBX 20000 UNIT/ML IJ SOLN
INTRAMUSCULAR | Status: AC
  Filled 2023-12-01: qty 1

## 2023-12-01 MED ORDER — ASPIRIN 81 MG PO TBEC: 81.0000 mg | DELAYED_RELEASE_TABLET | Freq: Every day | ORAL | Status: AC

## 2023-12-01 MED ORDER — NITROGLYCERIN 0.4 MG SL SUBL: 0.4000 mg | SUBLINGUAL_TABLET | SUBLINGUAL | 3 refills | Status: AC | PRN

## 2023-12-01 MED ORDER — EPOETIN ALFA-EPBX 20000 UNIT/ML IJ SOLN
20000.0000 [IU] | Freq: Once | INTRAMUSCULAR | Status: AC
  Administered 2023-12-01: 20000 [IU] via SUBCUTANEOUS

## 2023-12-01 MED ORDER — METOPROLOL SUCCINATE ER 25 MG PO TB24: 25.0000 mg | ORAL_TABLET | Freq: Every day | ORAL | 3 refills | Status: AC

## 2023-12-01 NOTE — Patient Instructions (Addendum)
 Medication Instructions:  Your physician has recommended you make the following change in your medication:  1) START taking aspirin  81 mg once daily  2) START taking Toprol  XL (metoprolol  succinate) 25 mg once daily  3) START taking nitroglycerin  sublingual tablets as needed  *If you need a refill on your cardiac medications before your next appointment, please call your pharmacy*  Lab Work: TODAY: BMET, CBC, BNP, and Lipids  Testing/Procedures: Echocardiogram Your physician has requested that you have an echocardiogram. Echocardiography is a painless test that uses sound waves to create images of your heart. It provides your doctor with information about the size and shape of your heart and how well your heart's chambers and valves are working. This procedure takes approximately one hour. There are no restrictions for this procedure. Please do NOT wear cologne, perfume, aftershave, or lotions (deodorant is allowed). Please arrive 15 minutes prior to your appointment time.  Please note: We ask at that you not bring children with you during ultrasound (echo/ vascular) testing. Due to room size and safety concerns, children are not allowed in the ultrasound rooms during exams. Our front office staff cannot provide observation of children in our lobby area while testing is being conducted. An adult accompanying a patient to their appointment will only be allowed in the ultrasound room at the discretion of the ultrasound technician under special circumstances. We apologize for any inconvenience.  Cardiac CT  Your physician has requested that you have cardiac CT. Cardiac computed tomography (CT) is a painless test that uses an x-ray machine to take clear, detailed pictures of your heart. For further information please visit [email or web address] Please follow instruction sheet as given.  Follow-Up: At [facility], you and your health needs are our priority.  As part of our continuing  mission to provide you with exceptional heart care, our providers are all part of one team.  This team includes your primary Cardiologist (physician) and Advanced Practice Providers or APPs (Physician Assistants and Nurse Practitioners) who all work together to provide you with the care you need, when you need it.  Your next appointment:   1 month  Provider:   [name], MD

## 2023-12-02 ENCOUNTER — Telehealth: Payer: Self-pay

## 2023-12-02 ENCOUNTER — Ambulatory Visit: Payer: Self-pay

## 2023-12-02 DIAGNOSIS — I5032 Chronic diastolic (congestive) heart failure: Secondary | ICD-10-CM

## 2023-12-02 LAB — CBC
Hematocrit: 32.5 % — ABNORMAL LOW (ref 37.5–51.0)
Hemoglobin: 10.4 g/dL — ABNORMAL LOW (ref 13.0–17.7)
MCH: 28.2 pg (ref 26.6–33.0)
MCHC: 32 g/dL (ref 31.5–35.7)
MCV: 88 fL (ref 79–97)
Platelets: 135 x10E3/uL — ABNORMAL LOW (ref 150–450)
RBC: 3.69 x10E6/uL — ABNORMAL LOW (ref 4.14–5.80)
RDW: 14.5 % (ref 11.6–15.4)
WBC: 6.6 x10E3/uL (ref 3.4–10.8)

## 2023-12-02 LAB — LIPID PANEL
Chol/HDL Ratio: 3.9 ratio (ref 0.0–5.0)
Cholesterol, Total: 181 mg/dL (ref 100–199)
HDL: 46 mg/dL (ref >39)
LDL Chol Calc (NIH): 108 mg/dL — ABNORMAL HIGH (ref 0–99)
Triglycerides: 154 mg/dL — ABNORMAL HIGH (ref 0–149)
VLDL Cholesterol Cal: 27 mg/dL (ref 5–40)

## 2023-12-02 LAB — PRO B NATRIURETIC PEPTIDE: NT-Pro BNP: 897 pg/mL — ABNORMAL HIGH (ref 0–486)

## 2023-12-02 LAB — BASIC METABOLIC PANEL WITH GFR
BUN/Creatinine Ratio: 16 (ref 10–24)
BUN: 35 mg/dL — ABNORMAL HIGH (ref 8–27)
CO2: 18 mmol/L — ABNORMAL LOW (ref 20–29)
Calcium: 8.9 mg/dL (ref 8.6–10.2)
Chloride: 108 mmol/L — ABNORMAL HIGH (ref 96–106)
Creatinine, Ser: 2.22 mg/dL — ABNORMAL HIGH (ref 0.76–1.27)
Glucose: 144 mg/dL — ABNORMAL HIGH (ref 70–99)
Potassium: 4.2 mmol/L (ref 3.5–5.2)
Sodium: 139 mmol/L (ref 134–144)
eGFR: 30 mL/min/1.73 — ABNORMAL LOW (ref >59)

## 2023-12-02 MED ORDER — TORSEMIDE 40 MG PO TABS: 40.0000 mg | ORAL_TABLET | Freq: Two times a day (BID) | ORAL | 2 refills | Status: DC

## 2023-12-02 MED ORDER — TORSEMIDE 20 MG PO TABS: 40.0000 mg | ORAL_TABLET | Freq: Two times a day (BID) | ORAL | 3 refills | Status: AC

## 2023-12-02 MED ORDER — ATORVASTATIN CALCIUM 40 MG PO TABS
40.0000 mg | ORAL_TABLET | Freq: Every day | ORAL | 2 refills | Status: AC
Start: 2023-12-02 — End: ?

## 2023-12-02 NOTE — Telephone Encounter (Signed)
 Pt c/o medication issue:  1. Name of Medication: torsemide  40 MG TABS   2. How are you currently taking this medication (dosage and times per day)?   3. Are you having a reaction (difficulty breathing--STAT)?   4. What is your medication issue? Only comes in brand name, calling to see if can be switch to two of the 20 mg tablets twice day.

## 2023-12-02 NOTE — Telephone Encounter (Signed)
 New RX sent

## 2023-12-02 NOTE — Telephone Encounter (Signed)
 Insurance will only cover the Torsemide  20 mg tablets, so the pt's pharmacy will order this medication as:   Torsemide , 20 mg tablets, take two tablets by mouth twice daily. (To equal the 40 mg)

## 2023-12-22 ENCOUNTER — Telehealth (HOSPITAL_COMMUNITY): Payer: Self-pay | Admitting: *Deleted

## 2023-12-22 NOTE — Telephone Encounter (Signed)
 Attempted to call patient regarding upcoming cardiac CT appointment. Left message on voicemail with name and callback number  Larey Brick RN Navigator Cardiac Imaging [facility] [phone] Office [phone] Cell

## 2023-12-22 NOTE — Telephone Encounter (Signed)
Patient returning call about his upcoming cardiac imaging study; pt verbalizes understanding of appt date/time, parking situation and where to check in, pre-test NPO status, and verified current allergies; name and call back number provided for further questions should they arise  Gordy Clement RN Navigator Cardiac Imaging [facility] [phone] office [phone] cell

## 2023-12-23 ENCOUNTER — Ambulatory Visit (HOSPITAL_COMMUNITY)
Admission: RE | Admit: 2023-12-23 | Discharge: 2023-12-23 | Disposition: A | Source: Ambulatory Visit | Attending: Cardiology | Admitting: Cardiology

## 2023-12-23 ENCOUNTER — Telehealth: Payer: Self-pay

## 2023-12-23 ENCOUNTER — Encounter (HOSPITAL_COMMUNITY): Payer: Self-pay

## 2023-12-23 DIAGNOSIS — R072 Precordial pain: Secondary | ICD-10-CM | POA: Insufficient documentation

## 2023-12-23 LAB — POCT I-STAT CREATININE: Creatinine, Ser: 2.9 mg/dL — ABNORMAL HIGH (ref 0.61–1.24)

## 2023-12-23 MED ORDER — IOHEXOL 350 MG/ML SOLN: 100.0000 mL | Freq: Once | INTRAVENOUS | Status: DC | PRN

## 2023-12-23 NOTE — Telephone Encounter (Signed)
°  Patient states he is returning a call from the office.

## 2023-12-23 NOTE — Progress Notes (Signed)
 We were not able to complete this patients CCTA today due to poor kidney function.   Istat Creat 2.9 with GFR 21.   Pt states his PCP is aware of the CKD and [facility] for anemia related CKD periodically.   Camie Shutter RN Navigator Cardiac Imaging [facility] [phone] Office  [phone] Cell

## 2023-12-23 NOTE — Telephone Encounter (Signed)
 Left message for patient informing him I did not see any documentation of our office contacting patient recently. Provided office number for callback.

## 2023-12-25 ENCOUNTER — Other Ambulatory Visit (HOSPITAL_COMMUNITY): Payer: Self-pay

## 2023-12-25 DIAGNOSIS — R079 Chest pain, unspecified: Secondary | ICD-10-CM

## 2023-12-29 ENCOUNTER — Ambulatory Visit (HOSPITAL_COMMUNITY)
Admission: RE | Admit: 2023-12-29 | Discharge: 2023-12-29 | Disposition: A | Discharge status: Home / Self Care | Source: Ambulatory Visit | Attending: Nephrology | Admitting: Nephrology

## 2023-12-29 VITALS — BP 142/60 | HR 73 | Temp 98.1°F | Resp 16

## 2023-12-29 DIAGNOSIS — D631 Anemia in chronic kidney disease: Secondary | ICD-10-CM | POA: Diagnosis present

## 2023-12-29 DIAGNOSIS — N183 Chronic kidney disease, stage 3 unspecified: Secondary | ICD-10-CM | POA: Insufficient documentation

## 2023-12-29 LAB — IRON AND TIBC
Iron: 47 ug/dL (ref 45–182)
Saturation Ratios: 22 % (ref 17.9–39.5)
TIBC: 217 ug/dL — ABNORMAL LOW (ref 250–450)
UIBC: 170 ug/dL

## 2023-12-29 LAB — FERRITIN: Ferritin: 751 ng/mL — ABNORMAL HIGH (ref 24–336)

## 2023-12-29 LAB — POCT HEMOGLOBIN-HEMACUE: Hemoglobin: 10.6 g/dL — ABNORMAL LOW (ref 13.0–17.0)

## 2023-12-29 MED ORDER — EPOETIN ALFA-EPBX 20000 UNIT/ML IJ SOLN
INTRAMUSCULAR | Status: AC
  Filled 2023-12-29: qty 1

## 2023-12-29 MED ORDER — EPOETIN ALFA-EPBX 20000 UNIT/ML IJ SOLN
20000.0000 [IU] | Freq: Once | INTRAMUSCULAR | Status: AC
  Administered 2023-12-29: 20000 [IU] via SUBCUTANEOUS

## 2024-01-04 ENCOUNTER — Other Ambulatory Visit: Payer: Self-pay | Admitting: Neurology

## 2024-01-05 ENCOUNTER — Other Ambulatory Visit: Payer: Self-pay

## 2024-01-08 ENCOUNTER — Encounter (HOSPITAL_COMMUNITY)

## 2024-01-08 ENCOUNTER — Telehealth (HOSPITAL_COMMUNITY): Payer: Self-pay

## 2024-01-08 ENCOUNTER — Other Ambulatory Visit (HOSPITAL_COMMUNITY): Payer: Self-pay

## 2024-01-08 ENCOUNTER — Ambulatory Visit (HOSPITAL_COMMUNITY)
Admission: RE | Admit: 2024-01-08 | Discharge: 2024-01-08 | Disposition: A | Discharge status: Home / Self Care | Source: Ambulatory Visit

## 2024-01-08 ENCOUNTER — Encounter (HOSPITAL_COMMUNITY)
Admission: RE | Admit: 2024-01-08 | Discharge: 2024-01-08 | Disposition: A | Discharge status: Home / Self Care | Source: Ambulatory Visit

## 2024-01-08 DIAGNOSIS — E1122 Type 2 diabetes mellitus with diabetic chronic kidney disease: Secondary | ICD-10-CM

## 2024-01-08 DIAGNOSIS — N184 Chronic kidney disease, stage 4 (severe): Secondary | ICD-10-CM

## 2024-01-08 DIAGNOSIS — R079 Chest pain, unspecified: Secondary | ICD-10-CM

## 2024-01-08 DIAGNOSIS — R072 Precordial pain: Secondary | ICD-10-CM | POA: Diagnosis present

## 2024-01-08 DIAGNOSIS — R0602 Shortness of breath: Secondary | ICD-10-CM | POA: Diagnosis present

## 2024-01-08 DIAGNOSIS — I5032 Chronic diastolic (congestive) heart failure: Secondary | ICD-10-CM

## 2024-01-08 DIAGNOSIS — I447 Left bundle-branch block, unspecified: Secondary | ICD-10-CM | POA: Diagnosis present

## 2024-01-08 LAB — MYOCARDIAL PERFUSION IMAGING
LV dias vol: 114 mL (ref 62–150)
LV sys vol: 60 mL
Nuc Stress EF: 47 %
Peak HR: 82 {beats}/min
Rest HR: 71 {beats}/min
Rest Nuclear Isotope Dose: 9.4 mCi
SDS: 0
SRS: 9
SSS: 1
ST Depression (mm): 0 mm
Stress Nuclear Isotope Dose: 29.4 mCi
TID: 0.89

## 2024-01-08 LAB — ECHOCARDIOGRAM COMPLETE
Area-P 1/2: 3.83 cm2
Est EF: 50
MV VTI: 1.91 cm2
P 1/2 time: 414 ms
S' Lateral: 2.5 cm

## 2024-01-08 MED ORDER — TECHNETIUM TC 99M TETROFOSMIN IV KIT
29.4000 | PACK | Freq: Once | INTRAVENOUS | Status: AC | PRN
  Administered 2024-01-08: 29.4 via INTRAVENOUS

## 2024-01-08 MED ORDER — REGADENOSON 0.4 MG/5ML IV SOLN
0.4000 mg | Freq: Once | INTRAVENOUS | Status: AC
  Administered 2024-01-08: 0.4 mg via INTRAVENOUS

## 2024-01-08 MED ORDER — REGADENOSON 0.4 MG/5ML IV SOLN
INTRAVENOUS | Status: AC
  Filled 2024-01-08: qty 5

## 2024-01-08 MED ORDER — TECHNETIUM TC 99M TETROFOSMIN IV KIT
9.4000 | PACK | Freq: Once | INTRAVENOUS | Status: AC | PRN
  Administered 2024-01-08: 9.4 via INTRAVENOUS

## 2024-01-08 NOTE — Telephone Encounter (Signed)
 Auth Submission: NO AUTH NEEDED Site of care: Site of care: [facility] Payer: [employer] Dual Medication & CPT/J Code(s) submitted: Retacrit  ([id number]) Diagnosis Code: N18.30/D63.1 Route of submission (phone, fax, portal): portal Phone # Fax # Auth type: Buy/Bill HB Units/visits requested: 20000 units q4weeks Reference number: [id number] Approval from: 01/07/24 to 01/05/25

## 2024-01-09 NOTE — Telephone Encounter (Signed)
"         I called the patient to discuss his results. 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 He denies any symptoms, but only because he has not left his place and done anything to exert himself. I encouraged him to be more active to enable us  to assess his symptom burden. We will optimize medical therapy to help with symptoms. We will also proceed with amyloidosis workup given severe dyspnea, no significant ischemia, and severe LVH on TTE.  Joelle DEL. Ren Ny, MD [facility]  "

## 2024-01-11 NOTE — Progress Notes (Addendum)
" °   °  °  Cardiology Office Note Date:  01/14/2024  ID:  Gerald [name], DOB 02-May-1945, MRN [id number] PCP:  [name], DO  Cardiologist:  [name], MD  No chief complaint on file.     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  History of Present Illness   Gerald [name] is a 79 year old male who presents with significant shortness of breath for follow-up regarding his symptoms and medication management. He has exertional shortness of breath, particularly with activities like showering, and usually needs to sit due to leg unsteadiness, though he recently tolerated a shower without breaks and felt some improvement. He has not yet tested himself with more strenuous activity but plans gradual return to the gym. He denies recent chest pain. He feels lethargic with low energy for exertion. He denies orthopnea or PND. Leg swelling has improved since starting a twice-daily diuretic. He has refills available and is taking it as prescribed.     ROS: Otherwise negative  Physical Exam VS:  BP 118/66   Pulse 89   Ht 5' 6.5 (1.689 m)   Wt 191 lb (86.6 kg)   SpO2 98%   BMI 30.37 kg/m  , BMI Body mass index is 30.37 kg/m. GEN: Well nourished, well developed, in no acute distress HEENT: normal Neck: no JVD, carotid bruits, or masses Cardiac: RRR; no murmurs, rubs, or gallops,no edema  Respiratory:  CTAB bilaterally,  normal work of breathing GI: soft, nontender, nondistended, + BS Extremities: No LE edema Skin: warm and dry, no rash Neuro:  Strength and sensation are intact  Recent Labs: Reviewed  ASSESSMENT AND PLAN Gerald [name] is a 79 y.o. male who presents for follow up.     Dyspnea DDx includes CAD or diastolic heart failure. 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 He had improvement in leg swelling with diuretics but no significant improvement in dyspnea. Appears euvolemic today - Continue current diuretic therapy. - Cont anti-anginal regimen. - Encouraged gradual return to physical activity with caution.  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 - Ordered blood work and testing for amyloidosis. - Scheduled follow-up in one month to assess response to physical activity and adjust medications.      Addendum 1/8 at 1:23 PM PYP not going to available for a couple of months, will obtain cardiac MRI instead  Signed, [name], MD  01/14/2024 11:04 AM    [facility] "

## 2024-01-14 ENCOUNTER — Encounter (HOSPITAL_COMMUNITY): Payer: Self-pay | Admitting: Nephrology

## 2024-01-14 ENCOUNTER — Telehealth: Payer: Self-pay | Admitting: *Deleted

## 2024-01-14 ENCOUNTER — Ambulatory Visit

## 2024-01-14 VITALS — BP 118/66 | HR 89 | Ht 66.5 in | Wt 191.0 lb

## 2024-01-14 DIAGNOSIS — Z01812 Encounter for preprocedural laboratory examination: Secondary | ICD-10-CM | POA: Diagnosis not present

## 2024-01-14 DIAGNOSIS — R9431 Abnormal electrocardiogram [ECG] [EKG]: Secondary | ICD-10-CM | POA: Diagnosis not present

## 2024-01-14 DIAGNOSIS — N184 Chronic kidney disease, stage 4 (severe): Secondary | ICD-10-CM | POA: Diagnosis not present

## 2024-01-14 DIAGNOSIS — I447 Left bundle-branch block, unspecified: Secondary | ICD-10-CM | POA: Diagnosis not present

## 2024-01-14 DIAGNOSIS — E1122 Type 2 diabetes mellitus with diabetic chronic kidney disease: Secondary | ICD-10-CM

## 2024-01-14 DIAGNOSIS — I5032 Chronic diastolic (congestive) heart failure: Secondary | ICD-10-CM | POA: Diagnosis not present

## 2024-01-14 DIAGNOSIS — R079 Chest pain, unspecified: Secondary | ICD-10-CM | POA: Diagnosis not present

## 2024-01-14 DIAGNOSIS — I517 Cardiomegaly: Secondary | ICD-10-CM | POA: Diagnosis not present

## 2024-01-14 NOTE — Patient Instructions (Signed)
 Medication Instructions:  Your physician recommends that you continue on your current medications as directed. Please refer to the Current Medication list given to you today.  *If you need a refill on your cardiac medications before your next appointment, please call your pharmacy*  Lab Work: Have lab work drawn today in the lab on the first floor If you have labs (blood work) drawn today and your tests are completely normal, you will receive your results only by: MyChart Message (if you have MyChart) OR A paper copy in the mail If you have any lab test that is abnormal or we need to change your treatment, we will call you to review the results.  Testing/Procedures: Dr Ren has requested you have a PYP Scan  Follow-Up: At [facility], you and your health needs are our priority.  As part of our continuing mission to provide you with exceptional heart care, our providers are all part of one team.  This team includes your primary Cardiologist (physician) and Advanced Practice Providers or APPs (Physician Assistants and Nurse Practitioners) who all work together to provide you with the care you need, when you need it.  Your next appointment:   1 month(s)  Provider:   [name], MD    We recommend signing up for the patient portal called MyChart.  Sign up information is provided on this After Visit Summary.  MyChart is used to connect with patients for Virtual Visits (Telemedicine).  Patients are able to view lab/test results, encounter notes, upcoming appointments, etc.  Non-urgent messages can be sent to your provider as well.   To learn more about what you can do with MyChart, go to [email or web address].   Other Instructions

## 2024-01-14 NOTE — Telephone Encounter (Signed)
 Opened in error

## 2024-01-14 NOTE — Addendum Note (Signed)
 Addended by: CECELIA MADURA R on: 01/14/2024 01:25 PM   Modules accepted: Orders

## 2024-01-16 LAB — HEMOGLOBIN AND HEMATOCRIT, BLOOD
Hematocrit: 32.1 % — ABNORMAL LOW (ref 37.5–51.0)
Hemoglobin: 10 g/dL — ABNORMAL LOW (ref 13.0–17.7)

## 2024-01-18 LAB — KAPPA/LAMBDA LIGHT CHAINS
Ig Kappa Free Light Chain: 108.8 mg/L — ABNORMAL HIGH (ref 3.3–19.4)
Ig Lambda Free Light Chain: 73.2 mg/L — ABNORMAL HIGH (ref 5.7–26.3)
KAPPA/LAMBDA RATIO: 1.49 (ref 0.26–1.65)

## 2024-01-20 LAB — MULTIPLE MYELOMA PANEL, SERUM
Albumin SerPl Elph-Mcnc: 3.1 g/dL (ref 2.9–4.4)
Albumin/Glob SerPl: 0.9 (ref 0.7–1.7)
Alpha 1: 0.2 g/dL (ref 0.0–0.4)
Alpha2 Glob SerPl Elph-Mcnc: 0.7 g/dL (ref 0.4–1.0)
B-Globulin SerPl Elph-Mcnc: 1.3 g/dL (ref 0.7–1.3)
Gamma Glob SerPl Elph-Mcnc: 1.3 g/dL (ref 0.4–1.8)
Globulin, Total: 3.5 g/dL (ref 2.2–3.9)
IgG (Immunoglobin G), Serum: 1447 mg/dL (ref 603–1613)
IgM (Immunoglobulin M), Srm: 30 mg/dL (ref 15–143)
Immunoglobulin A, (IgA) QN, Serum: 651 mg/dL — AB (ref 61–437)
M Protein SerPl Elph-Mcnc: 0.5 g/dL — AB
Total Protein: 6.6 g/dL (ref 6.0–8.5)

## 2024-01-20 LAB — IMMUNOFIXATION, URINE

## 2024-01-21 ENCOUNTER — Other Ambulatory Visit (HOSPITAL_COMMUNITY): Payer: Self-pay

## 2024-01-21 NOTE — Progress Notes (Signed)
 Received updated Retacrit  orders. No dose/lab changes notes. Provider change.  Dose: 20000 units every 4 weeks  Labs: POCT HgB at every visit; iron panel and ferritin monthly.  Sherry Pennant, PharmD, MPH, BCPS, CPP Clinical Pharmacist

## 2024-01-26 ENCOUNTER — Ambulatory Visit (HOSPITAL_COMMUNITY)
Admission: RE | Admit: 2024-01-26 | Discharge: 2024-01-26 | Disposition: A | Discharge status: Home / Self Care | Source: Ambulatory Visit | Attending: Nephrology | Admitting: Nephrology

## 2024-01-26 VITALS — BP 128/58 | HR 75 | Temp 97.3°F | Resp 15

## 2024-01-26 DIAGNOSIS — D631 Anemia in chronic kidney disease: Secondary | ICD-10-CM | POA: Diagnosis present

## 2024-01-26 DIAGNOSIS — N184 Chronic kidney disease, stage 4 (severe): Secondary | ICD-10-CM | POA: Diagnosis present

## 2024-01-26 LAB — POCT HEMOGLOBIN-HEMACUE: Hemoglobin: 9.8 g/dL — ABNORMAL LOW (ref 13.0–17.0)

## 2024-01-26 LAB — IRON AND TIBC
Iron: 54 ug/dL (ref 45–182)
Saturation Ratios: 28 % (ref 17.9–39.5)
TIBC: 197 ug/dL — ABNORMAL LOW (ref 250–450)
UIBC: 143 ug/dL

## 2024-01-26 LAB — FERRITIN: Ferritin: 934 ng/mL — ABNORMAL HIGH (ref 24–336)

## 2024-01-26 MED ORDER — CLONIDINE HCL 0.1 MG PO TABS: 0.1000 mg | ORAL_TABLET | ORAL | Status: DC | PRN

## 2024-01-26 MED ORDER — EPOETIN ALFA-EPBX 20000 UNIT/ML IJ SOLN
20000.0000 [IU] | Freq: Once | INTRAMUSCULAR | Status: AC
  Administered 2024-01-26: 20000 [IU] via SUBCUTANEOUS

## 2024-01-26 MED ORDER — EPOETIN ALFA-EPBX 20000 UNIT/ML IJ SOLN
INTRAMUSCULAR | Status: AC
  Filled 2024-01-26: qty 1

## 2024-02-03 ENCOUNTER — Other Ambulatory Visit (HOSPITAL_COMMUNITY)

## 2024-02-05 ENCOUNTER — Ambulatory Visit (HOSPITAL_COMMUNITY)

## 2024-02-23 ENCOUNTER — Encounter (HOSPITAL_COMMUNITY)

## 2024-02-23 ENCOUNTER — Ambulatory Visit

## 2024-02-23 DIAGNOSIS — I517 Cardiomegaly: Secondary | ICD-10-CM

## 2024-02-23 DIAGNOSIS — R079 Chest pain, unspecified: Secondary | ICD-10-CM

## 2024-02-23 DIAGNOSIS — R0602 Shortness of breath: Secondary | ICD-10-CM

## 2024-02-23 DIAGNOSIS — I447 Left bundle-branch block, unspecified: Secondary | ICD-10-CM

## 2024-02-23 DIAGNOSIS — I5032 Chronic diastolic (congestive) heart failure: Secondary | ICD-10-CM
